# Patient Record
Sex: Male | Born: 1950
Health system: Southern US, Community
[De-identification: ages and names within clinical notes are randomized; demographics above are authoritative.]

## PROBLEM LIST (undated history)

## (undated) DIAGNOSIS — T7840XA Allergy, unspecified, initial encounter: Secondary | ICD-10-CM

## (undated) DIAGNOSIS — N529 Male erectile dysfunction, unspecified: Secondary | ICD-10-CM

## (undated) DIAGNOSIS — F17200 Nicotine dependence, unspecified, uncomplicated: Secondary | ICD-10-CM

## (undated) DIAGNOSIS — C3490 Malignant neoplasm of unspecified part of unspecified bronchus or lung: Secondary | ICD-10-CM

## (undated) DIAGNOSIS — IMO0001 Reserved for inherently not codable concepts without codable children: Secondary | ICD-10-CM

## (undated) DIAGNOSIS — I2510 Atherosclerotic heart disease of native coronary artery without angina pectoris: Secondary | ICD-10-CM

## (undated) DIAGNOSIS — J449 Chronic obstructive pulmonary disease, unspecified: Secondary | ICD-10-CM

## (undated) DIAGNOSIS — M1990 Unspecified osteoarthritis, unspecified site: Secondary | ICD-10-CM

## (undated) DIAGNOSIS — Z87892 Personal history of anaphylaxis: Secondary | ICD-10-CM

## (undated) DIAGNOSIS — B029 Zoster without complications: Secondary | ICD-10-CM

## (undated) DIAGNOSIS — Z889 Allergy status to unspecified drugs, medicaments and biological substances status: Secondary | ICD-10-CM

## (undated) DIAGNOSIS — C61 Malignant neoplasm of prostate: Secondary | ICD-10-CM

## (undated) DIAGNOSIS — IMO0002 Reserved for concepts with insufficient information to code with codable children: Secondary | ICD-10-CM

## (undated) DIAGNOSIS — I10 Essential (primary) hypertension: Secondary | ICD-10-CM

## (undated) DIAGNOSIS — I700 Atherosclerosis of aorta: Secondary | ICD-10-CM

## (undated) DIAGNOSIS — D751 Secondary polycythemia: Secondary | ICD-10-CM

## (undated) HISTORY — DX: Allergy status to unspecified drugs, medicaments and biological substances: Z88.9

## (undated) HISTORY — DX: Personal history of anaphylaxis: Z87.892

## (undated) HISTORY — DX: Secondary polycythemia: D75.1

## (undated) HISTORY — DX: Male erectile dysfunction, unspecified: N52.9

## (undated) HISTORY — DX: Atherosclerotic heart disease of native coronary artery without angina pectoris: I25.10

## (undated) HISTORY — DX: Zoster without complications: B02.9

## (undated) HISTORY — DX: Unspecified osteoarthritis, unspecified site: M19.90

## (undated) HISTORY — DX: Atherosclerosis of aorta: I70.0

## (undated) HISTORY — DX: Allergy, unspecified, initial encounter: T78.40XA

## (undated) HISTORY — DX: Chronic obstructive pulmonary disease, unspecified: J44.9

## (undated) HISTORY — DX: Nicotine dependence, unspecified, uncomplicated: F17.200

---

## 2003-07-18 HISTORY — PX: HERNIA REPAIR: SHX51

## 2009-09-30 ENCOUNTER — Emergency Department (HOSPITAL_COMMUNITY): Admission: EM | Admit: 2009-09-30 | Discharge: 2009-09-30 | Payer: Self-pay | Admitting: Family Medicine

## 2011-04-03 ENCOUNTER — Other Ambulatory Visit: Payer: Self-pay | Admitting: Internal Medicine

## 2011-04-03 ENCOUNTER — Ambulatory Visit
Admission: RE | Admit: 2011-04-03 | Discharge: 2011-04-03 | Disposition: A | Discharge status: Home / Self Care | Payer: Medicare Other | Source: Ambulatory Visit | Attending: Internal Medicine | Admitting: Internal Medicine

## 2011-04-03 DIAGNOSIS — J40 Bronchitis, not specified as acute or chronic: Secondary | ICD-10-CM

## 2011-06-26 ENCOUNTER — Emergency Department (HOSPITAL_COMMUNITY): Payer: Managed Care, Other (non HMO)

## 2011-06-26 ENCOUNTER — Observation Stay (HOSPITAL_COMMUNITY)
Admission: EM | Admit: 2011-06-26 | Discharge: 2011-06-26 | Disposition: A | Discharge status: Home / Self Care | Payer: Managed Care, Other (non HMO) | Source: Ambulatory Visit | Attending: Internal Medicine | Admitting: Internal Medicine

## 2011-06-26 ENCOUNTER — Encounter: Payer: Self-pay | Admitting: *Deleted

## 2011-06-26 ENCOUNTER — Other Ambulatory Visit: Payer: Self-pay

## 2011-06-26 DIAGNOSIS — T782XXA Anaphylactic shock, unspecified, initial encounter: Principal | ICD-10-CM | POA: Insufficient documentation

## 2011-06-26 DIAGNOSIS — L509 Urticaria, unspecified: Secondary | ICD-10-CM | POA: Insufficient documentation

## 2011-06-26 DIAGNOSIS — F1010 Alcohol abuse, uncomplicated: Secondary | ICD-10-CM | POA: Insufficient documentation

## 2011-06-26 DIAGNOSIS — J4489 Other specified chronic obstructive pulmonary disease: Secondary | ICD-10-CM | POA: Insufficient documentation

## 2011-06-26 DIAGNOSIS — I10 Essential (primary) hypertension: Secondary | ICD-10-CM | POA: Insufficient documentation

## 2011-06-26 DIAGNOSIS — R55 Syncope and collapse: Secondary | ICD-10-CM | POA: Insufficient documentation

## 2011-06-26 DIAGNOSIS — E7800 Pure hypercholesterolemia, unspecified: Secondary | ICD-10-CM | POA: Insufficient documentation

## 2011-06-26 DIAGNOSIS — R062 Wheezing: Secondary | ICD-10-CM | POA: Insufficient documentation

## 2011-06-26 DIAGNOSIS — J449 Chronic obstructive pulmonary disease, unspecified: Secondary | ICD-10-CM | POA: Diagnosis present

## 2011-06-26 DIAGNOSIS — F17200 Nicotine dependence, unspecified, uncomplicated: Secondary | ICD-10-CM | POA: Insufficient documentation

## 2011-06-26 HISTORY — DX: Essential (primary) hypertension: I10

## 2011-06-26 HISTORY — DX: Chronic obstructive pulmonary disease, unspecified: J44.9

## 2011-06-26 LAB — POCT I-STAT, CHEM 8
BUN: 17 mg/dL (ref 6–23)
Calcium, Ion: 1.07 mmol/L — ABNORMAL LOW (ref 1.12–1.32)
Chloride: 100 mEq/L (ref 96–112)
Creatinine, Ser: 1.5 mg/dL — ABNORMAL HIGH (ref 0.50–1.35)
Glucose, Bld: 138 mg/dL — ABNORMAL HIGH (ref 70–99)
HCT: 53 % — ABNORMAL HIGH (ref 39.0–52.0)
Hemoglobin: 18 g/dL — ABNORMAL HIGH (ref 13.0–17.0)
Potassium: 3.2 mEq/L — ABNORMAL LOW (ref 3.5–5.1)
Sodium: 141 mEq/L (ref 135–145)
TCO2: 27 mmol/L (ref 0–100)

## 2011-06-26 LAB — POCT I-STAT TROPONIN I: Troponin i, poc: 0.03 ng/mL (ref 0.00–0.08)

## 2011-06-26 LAB — DIFFERENTIAL
Basophils Absolute: 0 10*3/uL (ref 0.0–0.1)
Basophils Relative: 0 % (ref 0–1)
Eosinophils Absolute: 0.1 10*3/uL (ref 0.0–0.7)
Eosinophils Relative: 1 % (ref 0–5)
Lymphocytes Relative: 18 % (ref 12–46)
Lymphs Abs: 2.8 10*3/uL (ref 0.7–4.0)
Monocytes Absolute: 1.1 10*3/uL — ABNORMAL HIGH (ref 0.1–1.0)
Monocytes Relative: 7 % (ref 3–12)
Neutro Abs: 11.5 10*3/uL — ABNORMAL HIGH (ref 1.7–7.7)
Neutrophils Relative %: 74 % (ref 43–77)

## 2011-06-26 LAB — CBC
HCT: 48.3 % (ref 39.0–52.0)
Hemoglobin: 17.4 g/dL — ABNORMAL HIGH (ref 13.0–17.0)
MCH: 34.3 pg — ABNORMAL HIGH (ref 26.0–34.0)
MCHC: 36 g/dL (ref 30.0–36.0)
MCV: 95.3 fL (ref 78.0–100.0)
Platelets: 145 10*3/uL — ABNORMAL LOW (ref 150–400)
RBC: 5.07 MIL/uL (ref 4.22–5.81)
RDW: 13.2 % (ref 11.5–15.5)
WBC: 15.6 10*3/uL — ABNORMAL HIGH (ref 4.0–10.5)

## 2011-06-26 LAB — MRSA PCR SCREENING: MRSA by PCR: NEGATIVE

## 2011-06-26 MED ORDER — ONDANSETRON HCL 4 MG PO TABS: 4.0000 mg | ORAL_TABLET | Freq: Four times a day (QID) | ORAL | Status: DC | PRN

## 2011-06-26 MED ORDER — ONDANSETRON HCL 4 MG/2ML IJ SOLN: 4.0000 mg | Freq: Four times a day (QID) | INTRAMUSCULAR | Status: DC | PRN

## 2011-06-26 MED ORDER — DIPHENHYDRAMINE HCL 25 MG PO CAPS
25.0000 mg | ORAL_CAPSULE | Freq: Three times a day (TID) | ORAL | Status: DC
  Administered 2011-06-26 (×2): 25 mg via ORAL
  Filled 2011-06-26 (×2): qty 1

## 2011-06-26 MED ORDER — FAMOTIDINE 20 MG PO TABS: 20.0000 mg | ORAL_TABLET | Freq: Two times a day (BID) | ORAL | Status: DC

## 2011-06-26 MED ORDER — VITAMIN B-1 100 MG PO TABS
100.0000 mg | ORAL_TABLET | Freq: Every day | ORAL | Status: DC
  Administered 2011-06-26: 100 mg via ORAL
  Filled 2011-06-26: qty 1

## 2011-06-26 MED ORDER — LORAZEPAM 2 MG/ML IJ SOLN: 1.0000 mg | Freq: Four times a day (QID) | INTRAMUSCULAR | Status: DC | PRN

## 2011-06-26 MED ORDER — HYDRALAZINE HCL 20 MG/ML IJ SOLN: 10.0000 mg | INTRAMUSCULAR | Status: DC | PRN

## 2011-06-26 MED ORDER — ROSUVASTATIN CALCIUM 10 MG PO TABS
10.0000 mg | ORAL_TABLET | Freq: Every day | ORAL | Status: DC
  Administered 2011-06-26: 10 mg via ORAL
  Filled 2011-06-26: qty 1

## 2011-06-26 MED ORDER — HYDROCHLOROTHIAZIDE 12.5 MG PO TABS: 25.0000 mg | ORAL_TABLET | Freq: Every day | ORAL | Status: DC

## 2011-06-26 MED ORDER — METHYLPREDNISOLONE SODIUM SUCC 40 MG IJ SOLR
40.0000 mg | Freq: Every day | INTRAMUSCULAR | Status: DC
  Administered 2011-06-26: 40 mg via INTRAVENOUS
  Filled 2011-06-26: qty 1

## 2011-06-26 MED ORDER — LORAZEPAM 1 MG PO TABS: 1.0000 mg | ORAL_TABLET | Freq: Four times a day (QID) | ORAL | Status: DC | PRN

## 2011-06-26 MED ORDER — ASPIRIN 81 MG PO CHEW
81.0000 mg | CHEWABLE_TABLET | Freq: Every day | ORAL | Status: DC
  Administered 2011-06-26: 81 mg via ORAL
  Filled 2011-06-26: qty 1

## 2011-06-26 MED ORDER — ACETAMINOPHEN 325 MG PO TABS: 650.0000 mg | ORAL_TABLET | Freq: Four times a day (QID) | ORAL | Status: DC | PRN

## 2011-06-26 MED ORDER — DIPHENHYDRAMINE HCL 50 MG/ML IJ SOLN
INTRAMUSCULAR | Status: AC
  Filled 2011-06-26: qty 1

## 2011-06-26 MED ORDER — IPRATROPIUM BROMIDE 0.02 % IN SOLN
RESPIRATORY_TRACT | Status: AC
  Filled 2011-06-26: qty 2.5

## 2011-06-26 MED ORDER — FOLIC ACID 1 MG PO TABS
1.0000 mg | ORAL_TABLET | Freq: Every day | ORAL | Status: DC
  Administered 2011-06-26: 1 mg via ORAL
  Filled 2011-06-26: qty 1

## 2011-06-26 MED ORDER — SODIUM CHLORIDE 0.9 % IV SOLN
INTRAVENOUS | Status: DC
  Administered 2011-06-26 (×2): via INTRAVENOUS

## 2011-06-26 MED ORDER — PREDNISONE (PAK) 10 MG PO TABS: ORAL_TABLET | ORAL | Status: DC

## 2011-06-26 MED ORDER — POTASSIUM CHLORIDE CRYS ER 20 MEQ PO TBCR
30.0000 meq | EXTENDED_RELEASE_TABLET | Freq: Once | ORAL | Status: AC
  Administered 2011-06-26: 30 meq via ORAL
  Filled 2011-06-26: qty 2

## 2011-06-26 MED ORDER — METHYLPREDNISOLONE SODIUM SUCC 125 MG IJ SOLR
INTRAMUSCULAR | Status: AC
  Administered 2011-06-26: 40 mg
  Filled 2011-06-26: qty 2

## 2011-06-26 MED ORDER — ALBUTEROL SULFATE (5 MG/ML) 0.5% IN NEBU
INHALATION_SOLUTION | RESPIRATORY_TRACT | Status: AC
  Filled 2011-06-26: qty 1

## 2011-06-26 MED ORDER — ACETAMINOPHEN 650 MG RE SUPP: 650.0000 mg | Freq: Four times a day (QID) | RECTAL | Status: DC | PRN

## 2011-06-26 MED ORDER — FAMOTIDINE IN NACL 20-0.9 MG/50ML-% IV SOLN
20.0000 mg | Freq: Two times a day (BID) | INTRAVENOUS | Status: DC
  Administered 2011-06-26: 20 mg via INTRAVENOUS
  Filled 2011-06-26 (×2): qty 50

## 2011-06-26 MED ORDER — POTASSIUM CHLORIDE ER 10 MEQ PO TBCR: 20.0000 meq | EXTENDED_RELEASE_TABLET | Freq: Two times a day (BID) | ORAL | Status: DC

## 2011-06-26 MED ORDER — POTASSIUM CHLORIDE CRYS ER 20 MEQ PO TBCR
40.0000 meq | EXTENDED_RELEASE_TABLET | Freq: Once | ORAL | Status: AC
  Administered 2011-06-26: 40 meq via ORAL
  Filled 2011-06-26: qty 2

## 2011-06-26 MED ORDER — THERA M PLUS PO TABS
1.0000 | ORAL_TABLET | Freq: Every day | ORAL | Status: DC
  Administered 2011-06-26: 1 via ORAL
  Filled 2011-06-26: qty 1

## 2011-06-26 MED ORDER — EPINEPHRINE 0.3 MG/0.3ML IJ DEVI: 0.3000 mg | Freq: Once | INTRAMUSCULAR | Status: DC

## 2011-06-26 MED ORDER — LORATADINE 10 MG PO TABS: 10.0000 mg | ORAL_TABLET | Freq: Every day | ORAL | Status: DC

## 2011-06-26 MED ORDER — THIAMINE HCL 100 MG/ML IJ SOLN
100.0000 mg | Freq: Every day | INTRAMUSCULAR | Status: DC
  Filled 2011-06-26: qty 1

## 2011-06-26 NOTE — Consult Note (Signed)
Pt smokes 1- 1 1/2 ppd. He is in action stage and eager to quit. After discussing pharmacotherapy options pt is interested in the patch and lozenges. Recommended 21 mg patch x 6 weeks, 14 mg patch x 2 weeks, 7 mg patch x 2 weeks. Also to use 4 mg nicotine lozenges PRN. Instructed pt on patch and lozenge use instructions. Referred to 1-800 quit now for f/u and support. Discussed oral fixation substitutes, second hand smoke and in home smoking policy. Reviewed and gave pt Written education/contact information.

## 2011-06-26 NOTE — Discharge Summary (Addendum)
DISCHARGE SUMMARY  Brian White  MR#: [id number]  DOB:Dec 13, 1950  Date of Admission: 06/26/2011 Date of Discharge: 06/26/2011  Attending Physician:[facility]  Patient's PCP: [facility] nurse practitioner at [facility]  Consults:  none  Discharge Diagnoses: Present on Admission:  .Anaphylaxis .HTN (hypertension) .COPD (chronic obstructive pulmonary disease)    Current Discharge Medication List    START taking these medications   Details  EPINEPHrine (EPIPEN) 0.3 mg/0.3 mL DEVI Inject 0.3 mLs (0.3 mg total) into the muscle once. Qty: 1 Device, Refills: prn    famotidine (PEPCID) 20 MG tablet Take 1 tablet (20 mg total) by mouth 2 (two) times daily.    hydrochlorothiazide (HYDRODIURIL) 12.5 MG tablet Take 2 tablets (25 mg total) by mouth daily. Qty: 60 tablet, Refills: 0    loratadine (CLARITIN) 10 MG tablet Take 1 tablet (10 mg total) by mouth daily.    potassium chloride (K-DUR) 10 MEQ tablet Take 2 tablets (20 mEq total) by mouth 2 (two) times daily. Qty: 30 tablet, Refills: 0    predniSONE (STERAPRED UNI-PAK) 10 MG tablet Take 4 tablets a day for 2 days starting 06/27/2011, then 2 tablets a day for 2 days, then one tablet a day for 2 days, then stop Qty: 14 tablet, Refills: 0      CONTINUE these medications which have NOT CHANGED   Details  aspirin 81 MG tablet Take 81 mg by mouth daily.      naproxen sodium (ANAPROX) 220 MG tablet Take 440 mg by mouth 2 (two) times daily with a meal. For pain     rosuvastatin (CRESTOR) 10 MG tablet Take 10 mg by mouth daily.        STOP taking these medications     ibuprofen (ADVIL,MOTRIN) 800 MG tablet      lisinopril (PRINIVIL,ZESTRIL) 10 MG tablet      lisinopril-hydrochlorothiazide (PRINZIDE,ZESTORETIC) 20-25 MG per tablet           [facility] Course: Present on Admission:   the patient was admitted to the [facility] on observation status after having an allergic reaction to an unknown  allergen.  He developed diffuse urticaria as well as hypotension leading to syncopal spells x2.  He was not able to identify any potential offending agents and states that he has not changed his usual diet or habits. With usual treatment to include intravenous steroids, H1 blockers, and H2 blockers he improved dramatically. By 4 PM on the day of his admission he was back to his baseline and asking to be discharged home.  At this time there no persistent urticaria. The patient has no respiratory difficulties whatsoever. I have counseled the patient extensively as to the use of an EpiPen. I've advised him that he should call 911 immediately should he require the use of his EpiPen. I've advised him that outpatient allergy testing would be very important to help prevent future exposures.   Day of Discharge BP 116/59  Pulse 54  Temp(Src) 98 F (36.7 C) (Oral)  Resp 23  Ht 5\' 11"  (1.803 m)  Wt 100.7 kg (222 lb 0.1 oz)  BMI 30.96 kg/m2  SpO2 95%  Physical Exam: General: No acute respiratory distress Lungs: Clear to auscultation bilaterally without wheezes or crackles Cardiovascular: Regular rate and rhythm without murmur gallop or rub normal S1 and S2 Abdomen: Nontender, nondistended, soft, bowel sounds positive, no rebound, no ascites, no appreciable mass Extremities: No significant cyanosis, clubbing, or edema bilateral lower extremities  Results for orders placed during  the [facility] encounter of 06/26/11 (from the past 24 hour(s))  CBC     Status: Abnormal   Collection Time   06/26/11  2:16 AM      Component Value Range   WBC 15.6 (*) 4.0 - 10.5 (K/uL)   RBC 5.07  4.22 - 5.81 (MIL/uL)   Hemoglobin 17.4 (*) 13.0 - 17.0 (g/dL)   HCT 16.1  09.6 - 04.5 (%)   MCV 95.3  78.0 - 100.0 (fL)   MCH 34.3 (*) 26.0 - 34.0 (pg)   MCHC 36.0  30.0 - 36.0 (g/dL)   RDW 40.9  81.1 - 91.4 (%)   Platelets 145 (*) 150 - 400 (K/uL)  DIFFERENTIAL     Status: Abnormal   Collection Time   06/26/11  2:16 AM       Component Value Range   Neutrophils Relative 74  43 - 77 (%)   Neutro Abs 11.5 (*) 1.7 - 7.7 (K/uL)   Lymphocytes Relative 18  12 - 46 (%)   Lymphs Abs 2.8  0.7 - 4.0 (K/uL)   Monocytes Relative 7  3 - 12 (%)   Monocytes Absolute 1.1 (*) 0.1 - 1.0 (K/uL)   Eosinophils Relative 1  0 - 5 (%)   Eosinophils Absolute 0.1  0.0 - 0.7 (K/uL)   Basophils Relative 0  0 - 1 (%)   Basophils Absolute 0.0  0.0 - 0.1 (K/uL)  POCT I-STAT TROPONIN I     Status: Normal   Collection Time   06/26/11  2:24 AM      Component Value Range   Troponin i, poc 0.03  0.00 - 0.08 (ng/mL)   Comment 3           POCT I-STAT, CHEM 8     Status: Abnormal   Collection Time   06/26/11  2:27 AM      Component Value Range   Sodium 141  135 - 145 (mEq/L)   Potassium 3.2 (*) 3.5 - 5.1 (mEq/L)   Chloride 100  96 - 112 (mEq/L)   BUN 17  6 - 23 (mg/dL)   Creatinine, Ser 7.82 (*) 0.50 - 1.35 (mg/dL)   Glucose, Bld 956 (*) 70 - 99 (mg/dL)   Calcium, Ion 2.13 (*) 1.12 - 1.32 (mmol/L)   TCO2 27  0 - 100 (mmol/L)   Hemoglobin 18.0 (*) 13.0 - 17.0 (g/dL)   HCT 08.6 (*) 57.8 - 52.0 (%)  MRSA PCR SCREENING     Status: Normal   Collection Time   06/26/11  5:30 AM      Component Value Range   MRSA by PCR NEGATIVE  NEGATIVE     Disposition: Discharge home  Follow-up Appts: Discharge Orders    Future Orders Please Complete By Expires   Diet - low sodium heart healthy      Increase activity slowly         Follow-up  1 - the patient is instructed to follow-up with his primary care provider at [facility] urgent care for a recheck of his blood pressure and potassium within 7 days  2 -the patient is instructed to contact [facility] to arrange for an outpatient visit for extensive allergy testing as soon as possible   Tests Needing Follow-up: A followup basic metabolic panel is recommended when the patient is evaluated by his primary care provider  Signed: [facility] 06/26/2011, 3:43 PM

## 2011-06-26 NOTE — ED Notes (Addendum)
Per EMS - pt reported to have syncopal episode at home by wife - on EMS arrival pt A&Ox4 no resp distress noted. Pt w/ generalized hives on assessment.  Pt received 10mg  alb neb, atroven, 25mg  PO benadryl prior to EMS arrival, pt then received 25mg  IV benadryl from EMS, 50mg  zantac, and 125mg  solu-medrol.

## 2011-06-26 NOTE — ED Notes (Signed)
Patient is resting comfortably. 

## 2011-06-26 NOTE — ED Provider Notes (Signed)
History     CSN: [id number] Arrival date & time: 06/26/2011  1:43 AM   First MD Initiated Contact with Patient 06/26/11 0210      Chief Complaint  Patient presents with  . Allergic Reaction    (Consider location/radiation/quality/duration/timing/severity/associated sxs/prior treatment) Patient is a 60 y.o. White presenting with allergic reaction. The history is provided by the patient and the EMS personnel. No language interpreter was used.  Allergic Reaction The primary symptoms are  wheezing and urticaria. The primary symptoms do not include cough, abdominal pain, nausea, vomiting, diarrhea or dizziness. Primary symptoms comment: syncope and hypotension The current episode started 1 to 2 hours ago. The problem has been gradually worsening. This is a new problem.  Wheezing began today. Wheezing occurs continuously. The wheezing has been rapidly improving since its onset. The wheezing was precipitated by animal exposure. The patient's medical history is significant for COPD.  Associated with: only exposure was to a family members dog. Significant symptoms also include itching. Significant symptoms that are not present include eye redness, flushing or rhinorrhea.  No clothing, medication, chemical exposures.    Past Medical History  Diagnosis Date  . Hypertension   . Hypercholesteremia   . Myocardial infarction     No past surgical history on file.  No family history on file.  History  Substance Use Topics  . Smoking status: Current Everyday Smoker -- 1.0 packs/day  . Smokeless tobacco: Not on file  . Alcohol Use: 12.6 oz/week    21 Shots of liquor per week      Review of Systems  Constitutional: Positive for activity change.  HENT: Negative for facial swelling, rhinorrhea, neck pain and neck stiffness.   Eyes: Negative for redness.  Respiratory: Positive for wheezing. Negative for cough and stridor.   Cardiovascular: Negative for chest pain.  Gastrointestinal: Negative  for nausea, vomiting, abdominal pain, diarrhea and abdominal distention.  Genitourinary: Negative for difficulty urinating.  Musculoskeletal: Negative for arthralgias.  Skin: Positive for itching. Negative for flushing.  Neurological: Negative for dizziness.  Hematological: Negative.   Psychiatric/Behavioral: Negative.     Allergies  Review of patient's allergies indicates no known allergies.  Home Medications   Current Outpatient Rx  Name Route Sig Dispense Refill  . ASPIRIN 81 MG PO TABS Oral Take 81 mg by mouth daily.      . IBUPROFEN 800 MG PO TABS Oral Take 800 mg by mouth once.      Marland Kitchen LISINOPRIL 10 MG PO TABS Oral Take 20 mg by mouth daily.     Marland Kitchen LISINOPRIL-HYDROCHLOROTHIAZIDE 20-25 MG PO TABS Oral Take 1 tablet by mouth daily.      Marland Kitchen NAPROXEN SODIUM 220 MG PO TABS Oral Take 440 mg by mouth 2 (two) times daily with a meal. For pain     . ROSUVASTATIN CALCIUM 10 MG PO TABS Oral Take 10 mg by mouth daily.        BP 169/72  Pulse 91  Temp(Src) 97.3 F (36.3 C) (Oral)  Resp 24  SpO2 98%  Physical Exam  Constitutional: He is oriented to person, place, and time. He appears well-developed and well-nourished.  HENT:  Head: Normocephalic and atraumatic.  Mouth/Throat: Oropharynx is clear and moist.       No swelling of the lips tongue or uvula  Eyes: EOM are normal. Pupils are equal, round, and reactive to light.  Neck: Normal range of motion. Neck supple.  Cardiovascular: Normal rate and regular rhythm.  Distant sounds  Pulmonary/Chest: No stridor. He has wheezes.  Abdominal: Soft. Bowel sounds are normal. There is no tenderness. There is no rebound and no guarding.  Musculoskeletal: Normal range of motion. He exhibits no edema.  Neurological: He is alert and oriented to person, place, and time.  Skin: Skin is warm and dry. He is not diaphoretic.       No urticaria on arrival  Psychiatric: Thought content normal.    ED Course  Procedures (including critical care  time)  Labs Reviewed  CBC - Abnormal; Notable for the following:    WBC 15.6 (*)    Hemoglobin 17.4 (*)    MCH 34.3 (*)    Platelets 145 (*)    All other components within normal limits  DIFFERENTIAL - Abnormal; Notable for the following:    Neutro Abs 11.5 (*)    Monocytes Absolute 1.1 (*)    All other components within normal limits  POCT I-STAT, CHEM 8 - Abnormal; Notable for the following:    Potassium 3.2 (*)    Creatinine, Ser 1.50 (*)    Glucose, Bld 138 (*)    Calcium, Ion 1.07 (*)    Hemoglobin 18.0 (*)    HCT 53.0 (*)    All other components within normal limits  POCT I-STAT TROPONIN I  I-STAT, CHEM 8  I-STAT TROPONIN I   Dg Chest Port 1 View  06/26/2011  *RADIOLOGY REPORT*  Clinical Data: Shortness of breath.  Syncope.  Hives.  PORTABLE CHEST - 1 VIEW  Comparison: 04/03/2011  Findings: Duration.  Normal heart size and pulmonary vascularity. Interstitial changes seen previously in the lung bases are improved or less well visualized.  No focal airspace consolidation suggested.  No blunting of costophrenic angles.  No pneumothorax.  IMPRESSION: No evidence of active pulmonary disease.  Original Report Authenticated By: Marlon Pel, M.D.     No diagnosis found.    MDM   Date: 06/26/2011  Rate: 84  Rhythm: normal sinus rhythm  QRS Axis: left  Intervals: normal  ST/T Wave abnormalities: nonspecific ST changes  Conduction Disutrbances:none  Narrative Interpretation: RBBB inferolateral infarct age undetermined  Old EKG Reviewed: none available         Kampbell Holaway K Nobel Brar-Rasch, MD 06/30/11 [phone]

## 2011-06-26 NOTE — H&P (Signed)
Brian White is an 60 y.o. male.   PCP is Dr. Wille Celeste at urgent care [facility]. Chief Complaint: Diffuse rash and itching. HPI: 60 year-old male with history of hypertension hyperlipidemia previous history of MI was brought in the ER after patient had diffuse urticarial rash and was found to be hypotensive. Patient states he went to the bed and around midnight he started getting itching and a diffuse rash broke all over body. So he decided to go have a bath. After having a bath he felt weak and had a syncopal episode. This was witnessed by his wife. His wife states he may have lost his consciousness for a few seconds. Then he wanted to go to the bathroom again and in the bathroom he again lost consciousness for 30 seconds. This time his wife called EMS. He was brought to the ER. In the ER Dr.Palumbo the ER physician who examined initially notice that he had diffuse rash. He was given Solu-Medrol, Benadryl, Zantac. After which he got better and his rash is completely resolved. His blood pressure is stable he'll be admitted for observation. Patient denies any swelling of the tongue though he did have some shortness of breath initially which also has resolved. Denies any chest pain, focal deficit or headache. Patient denies having taken any new medications, use any new cologne, used any new detergent or any new soaps. He did not have an insect bite. Patient has had a prostate biopsy last week after which he was given 3 does of antibiotics last dose was taken 5 days ago. He does not recall the medications name. He is on lisinopril the dose of which was increased 2 months ago. The last medication he took before going to bed was ibuprofen which he usually takes, for his back pain.  Past Medical History  Diagnosis Date  . Hypertension   . Hypercholesteremia   . Myocardial infarction     History reviewed. No pertinent past surgical history.  History reviewed. No pertinent family history. Social History:   reports that he has been smoking.  He does not have any smokeless tobacco history on file. He reports that he drinks about 12.6 ounces of alcohol per week. He reports that he uses illicit drugs (Marijuana).  Allergies: No Known Allergies  Medications Prior to Admission  Medication Dose Route Frequency Provider Last Rate Last Dose  . albuterol (PROVENTIL) (5 MG/ML) 0.5% nebulizer solution           . diphenhydrAMINE (BENADRYL) 50 MG/ML injection           . ipratropium (ATROVENT) 0.02 % nebulizer solution           . methylPREDNISolone sodium succinate (SOLU-MEDROL) 125 MG injection           . potassium chloride SA (K-DUR,KLOR-CON) CR tablet 30 mEq  30 mEq Oral Once April K Palumbo-Rasch, MD   30 mEq at 06/26/11 0355   No current outpatient prescriptions on file as of 06/26/2011.    Results for orders placed during the hospital encounter of 06/26/11 (from the past 48 hour(s))  CBC     Status: Abnormal   Collection Time   06/26/11  2:16 AM      Component Value Range Comment   WBC 15.6 (*) 4.0 - 10.5 (K/uL)    RBC 5.07  4.22 - 5.81 (MIL/uL)    Hemoglobin 17.4 (*) 13.0 - 17.0 (g/dL)    HCT 04.5  40.9 - 81.1 (%)    MCV 95.3  78.0 - 100.0 (fL)    MCH 34.3 (*) 26.0 - 34.0 (pg)    MCHC 36.0  30.0 - 36.0 (g/dL)    RDW 16.1  09.6 - 04.5 (%)    Platelets 145 (*) 150 - 400 (K/uL)   DIFFERENTIAL     Status: Abnormal   Collection Time   06/26/11  2:16 AM      Component Value Range Comment   Neutrophils Relative 74  43 - 77 (%)    Neutro Abs 11.5 (*) 1.7 - 7.7 (K/uL)    Lymphocytes Relative 18  12 - 46 (%)    Lymphs Abs 2.8  0.7 - 4.0 (K/uL)    Monocytes Relative 7  3 - 12 (%)    Monocytes Absolute 1.1 (*) 0.1 - 1.0 (K/uL)    Eosinophils Relative 1  0 - 5 (%)    Eosinophils Absolute 0.1  0.0 - 0.7 (K/uL)    Basophils Relative 0  0 - 1 (%)    Basophils Absolute 0.0  0.0 - 0.1 (K/uL)   POCT I-STAT TROPONIN I     Status: Normal   Collection Time   06/26/11  2:24 AM      Component Value  Range Comment   Troponin i, poc 0.03  0.00 - 0.08 (ng/mL)    Comment 3            POCT I-STAT, CHEM 8     Status: Abnormal   Collection Time   06/26/11  2:27 AM      Component Value Range Comment   Sodium 141  135 - 145 (mEq/L)    Potassium 3.2 (*) 3.5 - 5.1 (mEq/L)    Chloride 100  96 - 112 (mEq/L)    BUN 17  6 - 23 (mg/dL)    Creatinine, Ser 4.09 (*) 0.50 - 1.35 (mg/dL)    Glucose, Bld 811 (*) 70 - 99 (mg/dL)    Calcium, Ion 9.14 (*) 1.12 - 1.32 (mmol/L)    TCO2 27  0 - 100 (mmol/L)    Hemoglobin 18.0 (*) 13.0 - 17.0 (g/dL)    HCT 78.2 (*) 95.6 - 52.0 (%)    Dg Chest Port 1 View  06/26/2011  *RADIOLOGY REPORT*  Clinical Data: Shortness of breath.  Syncope.  Hives.  PORTABLE CHEST - 1 VIEW  Comparison: 04/03/2011  Findings: Duration.  Normal heart size and pulmonary vascularity. Interstitial changes seen previously in the lung bases are improved or less well visualized.  No focal airspace consolidation suggested.  No blunting of costophrenic angles.  No pneumothorax.  IMPRESSION: No evidence of active pulmonary disease.  Original Report Authenticated By: Marlon Pel, M.D.    Review of Systems  Constitutional: Negative.   HENT: Negative.   Eyes: Negative.   Respiratory: Positive for shortness of breath.   Cardiovascular: Negative.   Gastrointestinal: Negative.   Genitourinary: Negative.   Musculoskeletal: Negative.   Skin: Positive for itching and rash.  Neurological: Negative.   Endo/Heme/Allergies: Negative.   Psychiatric/Behavioral: Negative.     Blood pressure 103/49, pulse 57, temperature 97.3 F (36.3 C), temperature source Oral, resp. rate 23, SpO2 95.00%. Physical Exam  Constitutional: He is oriented to person, place, and time. He appears well-developed and well-nourished. No distress.  HENT:  Head: Normocephalic and atraumatic.  Right Ear: External ear normal.  Left Ear: External ear normal.  Nose: Nose normal.  Mouth/Throat: Oropharynx is clear and  moist. No oropharyngeal exudate.  Eyes: Conjunctivae and EOM are  normal. Pupils are equal, round, and reactive to light. Right eye exhibits no discharge. Left eye exhibits no discharge. No scleral icterus.  Neck: Normal range of motion. Neck supple. No JVD present.  Cardiovascular: Normal rate, regular rhythm, normal heart sounds and intact distal pulses.   Respiratory: Effort normal and breath sounds normal. No stridor. No respiratory distress. He has no wheezes. He has no rales.  GI: Soft. Bowel sounds are normal. He exhibits no distension. There is no tenderness. There is no rebound.  Musculoskeletal: Normal range of motion. He exhibits no edema and no tenderness.  Neurological: He is alert and oriented to person, place, and time. He has normal reflexes. No cranial nerve deficit. Coordination normal.  Skin: Skin is warm and dry. No rash noted. He is not diaphoretic. No erythema. No pallor.  Psychiatric: His behavior is normal.     Assessment/Plan #1. Anaphylaxis reaction versus severe urticarial reaction. #2. Recent prostate biopsy. #3. On ACE inhibitors for hypertension. #4. COPD. #5. History of MI. #6. Ongoing tobacco abuse and alcohol abuse.  Plan We'll admit to step down unit for observation. I am going to continue with Solu-Medrol, Benadryl, Pepcid. We don't know exactly what caused the reaction. We'll discontinue ACE inhibitors. For his blood pressure control at this time I'm placing him on IV hydralazine when necessary but eventually may need definite antihypertensives but may not continue ACE inhibitors.   06/26/2011, 4:49 AM   222

## 2011-06-26 NOTE — Progress Notes (Signed)
UR COMPLETED PT WAS ADMITTED WITH ALLERGIC REACTION TO A MEDICATION.  WILL F/U ON DC NEEDS, PTA PT WAS AT HOME WITH SELF CARE [facility] 06/26/2011 [phone] OR [phone]

## 2011-07-18 DIAGNOSIS — Z87892 Personal history of anaphylaxis: Secondary | ICD-10-CM

## 2011-07-18 DIAGNOSIS — C61 Malignant neoplasm of prostate: Secondary | ICD-10-CM

## 2011-07-18 HISTORY — DX: Personal history of anaphylaxis: Z87.892

## 2011-07-18 HISTORY — DX: Malignant neoplasm of prostate: C61

## 2011-08-02 ENCOUNTER — Encounter: Payer: Self-pay | Admitting: Radiation Oncology

## 2011-08-02 ENCOUNTER — Ambulatory Visit
Admission: RE | Admit: 2011-08-02 | Discharge: 2011-08-02 | Disposition: A | Discharge status: Home / Self Care | Payer: Managed Care, Other (non HMO) | Source: Ambulatory Visit | Attending: Radiation Oncology | Admitting: Radiation Oncology

## 2011-08-02 VITALS — BP 148/87 | HR 57 | Temp 97.2°F | Resp 18 | Ht 71.0 in | Wt 222.4 lb

## 2011-08-02 DIAGNOSIS — E7800 Pure hypercholesterolemia, unspecified: Secondary | ICD-10-CM | POA: Diagnosis not present

## 2011-08-02 DIAGNOSIS — C61 Malignant neoplasm of prostate: Secondary | ICD-10-CM

## 2011-08-02 DIAGNOSIS — Z809 Family history of malignant neoplasm, unspecified: Secondary | ICD-10-CM | POA: Insufficient documentation

## 2011-08-02 DIAGNOSIS — Z79899 Other long term (current) drug therapy: Secondary | ICD-10-CM | POA: Diagnosis not present

## 2011-08-02 DIAGNOSIS — R300 Dysuria: Secondary | ICD-10-CM | POA: Diagnosis not present

## 2011-08-02 DIAGNOSIS — Z7982 Long term (current) use of aspirin: Secondary | ICD-10-CM | POA: Insufficient documentation

## 2011-08-02 DIAGNOSIS — I10 Essential (primary) hypertension: Secondary | ICD-10-CM | POA: Insufficient documentation

## 2011-08-02 DIAGNOSIS — I252 Old myocardial infarction: Secondary | ICD-10-CM | POA: Insufficient documentation

## 2011-08-02 DIAGNOSIS — R351 Nocturia: Secondary | ICD-10-CM | POA: Insufficient documentation

## 2011-08-02 DIAGNOSIS — Z510 Encounter for antineoplastic radiation therapy: Secondary | ICD-10-CM | POA: Diagnosis not present

## 2011-08-02 HISTORY — DX: Malignant neoplasm of prostate: C61

## 2011-08-02 NOTE — Progress Notes (Signed)
[facility] Radiation Oncology NEW PATIENT EVALUATION  Name: Brian White MRN: [id number]  Date: 08/02/2011  DOB: Dec 24, 1950  Status: outpatient   CC:   Kathi Ludwig, * Dr. Fredrik Cove   REFERRING PHYSICIAN: Jethro Bolus I, *   DIAGNOSIS: Stage TI C. 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 male who is seen today for the courtesy of Dr. Patsi Sears for discussion of possible radiation therapy in the management of his stage TI C. favorable risk adenocarcinoma prostate at the time of his annual physical back in July 2012 he was found to have an elevated PSA of 6.2. He was referred to Dr. Patsi Sears who repeated his PSA and this was 7.12 on 05/30/2011. His percent free PSA was only 15%. He underwent ultrasound-guided biopsies on 06/21/2011. He was found to have 4 areas of Gleason 6 (3+3) adenocarcinoma involving 40% of one core from the right lateral base, 5% of one core from the right mid gland, 5% of one core from the right lateral apex and less than 5% of one core from the left lateral base. His gland volume was 88.5 cc with a prosthetic length is 5.68 cm. He is doing reasonably well from a GU and GI standpoint. His I PSS score is 12. No GI difficulties. He does have rectal dysfunction and he has tried Levitra which was of marginal benefit.   PREVIOUS RADIATION THERAPY: No   PAST MEDICAL HISTORY:  has a past medical history of Hypertension; Hypercholesteremia (1990); Arthritis; Heart disease; Myocardial infarction; and Prostate cancer.     PAST SURGICAL HISTORY:  Past Surgical History  Procedure Date  . Hernia repair   . Prostate biopsy      FAMILY HISTORY: family history includes Cancer in his father and sister. his father died from cardiac disease and 46. His mother is alive and well at 62 and lives independently.   SOCIAL HISTORY:  reports that he has been smoking Cigarettes.  He has a 60 pack-year  smoking history. He does not have any smokeless tobacco history on file. He reports that he drinks about .6 ounces of alcohol per week. He reports that he uses illicit drugs (Marijuana). Recently remarried 3 months ago. 2 daughters from his previous marriage, 4 grandchildren. 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   ALLERGIES: Onion   MEDICATIONS:  Current Outpatient Prescriptions  Medication Sig Dispense Refill  . aspirin 81 MG tablet Take 81 mg by mouth daily.        . finasteride (PROSCAR) 5 MG tablet Take 5 mg by mouth daily.      Marland Kitchen lisinopril-hydrochlorothiazide (PRINZIDE,ZESTORETIC) 20-25 MG per tablet Take 1 tablet by mouth daily.      . naproxen sodium (ANAPROX) 220 MG tablet Take 440 mg by mouth 2 (two) times daily with a meal. For pain       . vardenafil (LEVITRA) 10 MG tablet Take 10 mg by mouth daily as needed.      . ALPRAZolam (XANAX) 0.5 MG tablet Take 0.5 mg by mouth at bedtime as needed.      Marland Kitchen EPINEPHrine (EPIPEN) 0.3 mg/0.3 mL DEVI Inject 0.3 mLs (0.3 mg total) into the muscle once.  1 Device  prn  . loratadine (CLARITIN) 10 MG tablet Take 1 tablet (10 mg total) by mouth daily.      . rosuvastatin (CRESTOR) 10 MG tablet Take 10 mg by mouth daily.  REVIEW OF SYSTEMS:  Pertinent items are noted in HPI.    PHYSICAL EXAM:  height is 5\' 11"  (1.803 m) and weight is 222 lb 6.4 oz (100.88 kg). His oral temperature is 97.2 F (36.2 C). His blood pressure is 148/87 and his pulse is 57. His respiration is 18.   Head and neck examination: Grossly unremarkable. Nodes: Without palpable cervical or supraclavicular lymphadenopathy. Chest: A few scattered wheezes with diminished breath sounds throughout. Heart: Regular in rhythm. Back: Without palpable spinal or CVA discomfort. Abdomen: Without masses or organomegaly. Genitalia: Unremarkable to inspection. Rectal: His prostate gland is moderately enlarged and is without focal induration or nodularity.  Extremities: Without edema. Neurologic exam: Grossly nonfocal.   LABORATORY DATA:  Lab Results  Component Value Date   WBC 15.6* 06/26/2011   HGB 18.0* 06/26/2011   HCT 53.0* 06/26/2011   MCV 95.3 06/26/2011   PLT 145* 06/26/2011   Lab Results  Component Value Date   NA 141 06/26/2011   K 3.2* 06/26/2011   CL 100 06/26/2011   No results found for this basename: ALT, AST, GGT, ALKPHOS, BILITOT   PSA 7.15 from 05/25/2011.   IMPRESSION: Stage TI C. favorable risk adenocarcinoma prostate. I explained to the patient and his wife that his prognosis is related to his stage, PSA level, and Gleason score. All are favorable. We discussed surgery versus observation versus radiation therapy. Radiation therapy options include seed implantation alone versus external beam/IMRT. He would obviously require downsizing because of his large gland size. We can expect at least 30% shrinkage with 3 months of androgen deprivation therapy. He may very well require up to 6 months of androgen deprivation therapy to get his gland size in length to be suitable for seed implantation. We discussed seed implantation and external beam/IMRT in great detail. We discussed the potential side effects of short-term androgen deprivation therapy for downsizing. We also discussed the potential acute and late toxicities of radiation therapy. He will think things over and get back in touch with me if you like to consider radiation therapy. He was given literature for review.   PLAN: As discussed above. I gave him my voicemail to contact me should he want to consider radiation therapy.   I spent 60 minutes minutes face to face with the patient and more than 50% of that time was spent in counseling and/or coordination of care.

## 2011-08-02 NOTE — Progress Notes (Signed)
Patient presented to the clinic today accompanied by his wife for an initial consultation with [name]. Patient is alert and oriented to person, place, and time. No distress noted. Steady gait noted. Pleasant affect noted. Patient denies pain at this time. Patient denies nausea, vomiting, diarrhea, constipation, dizziness or headache. Patient reports urgency. Patient reports urinating 1-2 times per night on average. Patient denies pain or burning with urination or bowel movement. Patient reports voiding approximately every 2-3 hours during the day. Patient denies seeing blood in his urine. Patient reports eating and sleeping without difficulty. Reported all findings to [name].    IPSS score 12

## 2011-08-02 NOTE — Progress Notes (Signed)
62 year old male. Married X 3 months. Retired.   Patient referred to Dr. Patsi Sears 03/24/11 from Dr. Fredrik Cove reference elevated PSA, urinary frequency, nocturia, ED, and IPSS score of 3.   02/01/11 PSA 6.2 05/25/11 PSA 7.15  Prostate biopsy done 06/21/11. Biopsy revealed adenocarcinoma of the prostate with Gleason of 6 in 40% of right lateral base, 5% of right apex lateral, right mid medial, and left base lateral biopsies. Prostate 88 grams.  Patient and wife expressed to Dr.Tannenbaum they would like to pursue I-125 seeds , a second opinion from [name], and hormone script to shrink his prostate.   NKDA No hx of radiation therapy in the past No indications of a pacemaker

## 2011-08-02 NOTE — Progress Notes (Signed)
Please see the Nurse Progress Note in the MD Initial Consult Encounter for this patient. 

## 2011-08-02 NOTE — Progress Notes (Signed)
Complete NUTRITION RISK SCREEN worksheet without concerns turned into Zenovia Jarred, RD. Also, complete PATIENT MEASURE OF DISTRESS worksheet with a score of 0 submitted to social work.

## 2011-08-04 NOTE — Progress Notes (Signed)
Encounter addended by: Ardell Isaacs, RN on: 08/04/2011 11:36 AM<BR>     Documentation filed: Inpatient Patient Education, Charges VN

## 2011-08-09 ENCOUNTER — Other Ambulatory Visit: Payer: Self-pay | Admitting: Radiation Oncology

## 2011-08-09 ENCOUNTER — Encounter: Payer: Self-pay | Admitting: Radiation Oncology

## 2011-08-09 NOTE — Progress Notes (Signed)
Chart note:   Brian White call me today and he wants to have 8 weeks of external beam/IMRT. We will kindly asked Dr. Patsi Sears to place 3 gold seeds for image guidance. We'll then get him back for simulation/treatment planning.

## 2011-08-14 ENCOUNTER — Telehealth: Payer: Self-pay | Admitting: *Deleted

## 2011-08-18 HISTORY — PX: INSERTION PROSTATE RADIATION SEED: SUR718

## 2011-09-06 NOTE — Telephone Encounter (Signed)
XXX

## 2011-09-07 ENCOUNTER — Ambulatory Visit
Admission: RE | Admit: 2011-09-07 | Discharge: 2011-09-07 | Disposition: A | Discharge status: Home / Self Care | Payer: Managed Care, Other (non HMO) | Source: Ambulatory Visit | Attending: Radiation Oncology | Admitting: Radiation Oncology

## 2011-09-07 DIAGNOSIS — C61 Malignant neoplasm of prostate: Secondary | ICD-10-CM

## 2011-09-07 DIAGNOSIS — I10 Essential (primary) hypertension: Secondary | ICD-10-CM | POA: Diagnosis not present

## 2011-09-07 DIAGNOSIS — R300 Dysuria: Secondary | ICD-10-CM | POA: Diagnosis not present

## 2011-09-07 DIAGNOSIS — I252 Old myocardial infarction: Secondary | ICD-10-CM | POA: Diagnosis not present

## 2011-09-07 DIAGNOSIS — Z510 Encounter for antineoplastic radiation therapy: Secondary | ICD-10-CM | POA: Diagnosis not present

## 2011-09-07 DIAGNOSIS — E7800 Pure hypercholesterolemia, unspecified: Secondary | ICD-10-CM | POA: Diagnosis not present

## 2011-09-07 NOTE — Progress Notes (Signed)
Simulation/treatment planning note: The patient underwent simulation/treatment planning in the management of his carcinoma of the prostate. An alpha cradle was constructed for immobilization. A red rubber tube was placed within the rectal vault. He was then catheterized and contrast instilled into his bladder/urethra. He was then scanned. I chose and isocenter within the center of the prostate. I contoured his prostate (GTV), seminal vesicles, rectum, and bladder. Other avoidance structures including his femoral heads for also contoured. I'm prescribing 7600 cGy in 40 sessions to his prostate PTV and 5006 and her cGy in 40 sessions to his seminal vesicle PTV. He is now ready for IM RT simulation/treatment planning.

## 2011-09-07 NOTE — Progress Notes (Signed)
Met with patient to discuss RO billing.  Attending Rad: [name]  Dx: 185 Malignant neoplasm of prostate  Rad Tx:  40981 x40 IMRT

## 2011-09-11 ENCOUNTER — Encounter: Payer: Self-pay | Admitting: Radiation Oncology

## 2011-09-11 DIAGNOSIS — R300 Dysuria: Secondary | ICD-10-CM | POA: Diagnosis not present

## 2011-09-11 DIAGNOSIS — I252 Old myocardial infarction: Secondary | ICD-10-CM | POA: Diagnosis not present

## 2011-09-11 DIAGNOSIS — C61 Malignant neoplasm of prostate: Secondary | ICD-10-CM | POA: Diagnosis not present

## 2011-09-11 DIAGNOSIS — E7800 Pure hypercholesterolemia, unspecified: Secondary | ICD-10-CM | POA: Diagnosis not present

## 2011-09-11 DIAGNOSIS — Z510 Encounter for antineoplastic radiation therapy: Secondary | ICD-10-CM | POA: Diagnosis not present

## 2011-09-11 DIAGNOSIS — I10 Essential (primary) hypertension: Secondary | ICD-10-CM | POA: Diagnosis not present

## 2011-09-11 NOTE — Progress Notes (Signed)
IMRT simulation/treatment planning note:  The patient underwent IMRT simulation/treatment planning today. IMRT was chosen to decrease the risk for both acute and late bladder and rectal toxicity compared to 3-D conformal or conventional radiation therapy. Dose volume histograms were obtained for the target structures including the prostate/seminal vesicles and also of avoidance structures including the rectum, bladder, and femoral heads. We met our department all goals with respect to our target coverage and also avoidance structures. I prescribing 7600 cGy 40 sessions to his prostate PTV and 5600 cGy in 40 sessions to his seminal vesicle PTV. He'll undergo daily KB imaging, setting up to his 3 gold seeds for image guidance. We'll also obtain a weekly cone beam CT scan to assess his bladder filling. He is to be treated with a comfortably full bladder.

## 2011-09-12 ENCOUNTER — Encounter: Payer: Self-pay | Admitting: *Deleted

## 2011-09-18 ENCOUNTER — Encounter: Payer: Self-pay | Admitting: Radiation Oncology

## 2011-09-18 ENCOUNTER — Ambulatory Visit
Admission: RE | Admit: 2011-09-18 | Discharge: 2011-09-18 | Disposition: A | Discharge status: Home / Self Care | Payer: Managed Care, Other (non HMO) | Source: Ambulatory Visit | Attending: Radiation Oncology | Admitting: Radiation Oncology

## 2011-09-18 VITALS — BP 168/89 | HR 55 | Resp 18 | Wt 225.5 lb

## 2011-09-18 DIAGNOSIS — C61 Malignant neoplasm of prostate: Secondary | ICD-10-CM

## 2011-09-18 DIAGNOSIS — R300 Dysuria: Secondary | ICD-10-CM | POA: Diagnosis not present

## 2011-09-18 DIAGNOSIS — I252 Old myocardial infarction: Secondary | ICD-10-CM | POA: Diagnosis not present

## 2011-09-18 DIAGNOSIS — I10 Essential (primary) hypertension: Secondary | ICD-10-CM | POA: Diagnosis not present

## 2011-09-18 DIAGNOSIS — E7800 Pure hypercholesterolemia, unspecified: Secondary | ICD-10-CM | POA: Diagnosis not present

## 2011-09-18 DIAGNOSIS — Z510 Encounter for antineoplastic radiation therapy: Secondary | ICD-10-CM | POA: Diagnosis not present

## 2011-09-18 NOTE — Progress Notes (Signed)
Weekly Management Note:  Site:Prostate Current Dose:  190  cGy Projected Dose: 7800  cGy  Narrative: The patient is seen today for routine under treatment assessment. CBCT/MVCT images/port films were reviewed. The chart was reviewed.  Bladder filling was suboptimal today. No new GU or GI difficulties. He plans on getting his finasteride renewed per Dr. Patsi Sears.  Physical Examination:  Filed Vitals:   09/18/11 1748  BP: 168/89  Pulse: 55  Resp: 18  .  Weight: 225 lb 8 oz (102.286 kg). No change.  Impression: Tolerating radiation therapy well. His bladder filling was suboptimal and I encouraged him to make a better effort. His wife tells me that he simply forgot to fill his bladder before treatment today.  Plan: Continue radiation therapy as planned.

## 2011-09-18 NOTE — Progress Notes (Signed)
Patient presented to the clinic today for PUT with [name] and post sim education with Sam, RN. Patient is alert and oriented to person, place, and time. No distress noted. Steady gait noted. Pleasant affect noted. Patient denies pain at this time. Oriented patient and his wife to staff and routine of the clinic. Provided patient with RADIATION THERAPY AND YOU handbook then, reviewed pertinent material. Reviewed potential side effects and management. Provided this patient with this writers business card and encouraged to call with needs. Patient verbalized understanding of all things reviewed.Patient denies nausea, vomiting, headache, dizziness, or diarrhea. Patient denies hematuria. Patient reports urgency. Patient reports that he gets up on average 2-3 times per night to void. Patient denies burning with urination. Reported all findings to [name].

## 2011-09-19 ENCOUNTER — Ambulatory Visit
Admission: RE | Admit: 2011-09-19 | Discharge: 2011-09-19 | Disposition: A | Discharge status: Home / Self Care | Payer: Managed Care, Other (non HMO) | Source: Ambulatory Visit | Attending: Radiation Oncology | Admitting: Radiation Oncology

## 2011-09-19 DIAGNOSIS — R300 Dysuria: Secondary | ICD-10-CM | POA: Diagnosis not present

## 2011-09-19 DIAGNOSIS — E7800 Pure hypercholesterolemia, unspecified: Secondary | ICD-10-CM | POA: Diagnosis not present

## 2011-09-19 DIAGNOSIS — I252 Old myocardial infarction: Secondary | ICD-10-CM | POA: Diagnosis not present

## 2011-09-19 DIAGNOSIS — I10 Essential (primary) hypertension: Secondary | ICD-10-CM | POA: Diagnosis not present

## 2011-09-19 DIAGNOSIS — Z510 Encounter for antineoplastic radiation therapy: Secondary | ICD-10-CM | POA: Diagnosis not present

## 2011-09-19 DIAGNOSIS — C61 Malignant neoplasm of prostate: Secondary | ICD-10-CM | POA: Diagnosis not present

## 2011-09-20 ENCOUNTER — Ambulatory Visit
Admission: RE | Admit: 2011-09-20 | Discharge: 2011-09-20 | Disposition: A | Discharge status: Home / Self Care | Payer: Managed Care, Other (non HMO) | Source: Ambulatory Visit | Attending: Radiation Oncology | Admitting: Radiation Oncology

## 2011-09-20 DIAGNOSIS — I252 Old myocardial infarction: Secondary | ICD-10-CM | POA: Diagnosis not present

## 2011-09-20 DIAGNOSIS — R300 Dysuria: Secondary | ICD-10-CM | POA: Diagnosis not present

## 2011-09-20 DIAGNOSIS — Z510 Encounter for antineoplastic radiation therapy: Secondary | ICD-10-CM | POA: Diagnosis not present

## 2011-09-20 DIAGNOSIS — C61 Malignant neoplasm of prostate: Secondary | ICD-10-CM | POA: Diagnosis not present

## 2011-09-20 DIAGNOSIS — E7800 Pure hypercholesterolemia, unspecified: Secondary | ICD-10-CM | POA: Diagnosis not present

## 2011-09-20 DIAGNOSIS — I10 Essential (primary) hypertension: Secondary | ICD-10-CM | POA: Diagnosis not present

## 2011-09-21 ENCOUNTER — Ambulatory Visit
Admission: RE | Admit: 2011-09-21 | Discharge: 2011-09-21 | Disposition: A | Discharge status: Home / Self Care | Payer: Managed Care, Other (non HMO) | Source: Ambulatory Visit | Attending: Radiation Oncology | Admitting: Radiation Oncology

## 2011-09-21 DIAGNOSIS — Z510 Encounter for antineoplastic radiation therapy: Secondary | ICD-10-CM | POA: Diagnosis not present

## 2011-09-21 DIAGNOSIS — C61 Malignant neoplasm of prostate: Secondary | ICD-10-CM | POA: Diagnosis not present

## 2011-09-21 DIAGNOSIS — R300 Dysuria: Secondary | ICD-10-CM | POA: Diagnosis not present

## 2011-09-21 DIAGNOSIS — I252 Old myocardial infarction: Secondary | ICD-10-CM | POA: Diagnosis not present

## 2011-09-21 DIAGNOSIS — E7800 Pure hypercholesterolemia, unspecified: Secondary | ICD-10-CM | POA: Diagnosis not present

## 2011-09-21 DIAGNOSIS — I10 Essential (primary) hypertension: Secondary | ICD-10-CM | POA: Diagnosis not present

## 2011-09-22 ENCOUNTER — Ambulatory Visit
Admission: RE | Admit: 2011-09-22 | Discharge: 2011-09-22 | Disposition: A | Discharge status: Home / Self Care | Payer: Managed Care, Other (non HMO) | Source: Ambulatory Visit | Attending: Radiation Oncology | Admitting: Radiation Oncology

## 2011-09-22 DIAGNOSIS — C61 Malignant neoplasm of prostate: Secondary | ICD-10-CM | POA: Diagnosis not present

## 2011-09-22 DIAGNOSIS — I252 Old myocardial infarction: Secondary | ICD-10-CM | POA: Diagnosis not present

## 2011-09-22 DIAGNOSIS — I10 Essential (primary) hypertension: Secondary | ICD-10-CM | POA: Diagnosis not present

## 2011-09-22 DIAGNOSIS — E7800 Pure hypercholesterolemia, unspecified: Secondary | ICD-10-CM | POA: Diagnosis not present

## 2011-09-22 DIAGNOSIS — R300 Dysuria: Secondary | ICD-10-CM | POA: Diagnosis not present

## 2011-09-22 DIAGNOSIS — Z510 Encounter for antineoplastic radiation therapy: Secondary | ICD-10-CM | POA: Diagnosis not present

## 2011-09-25 ENCOUNTER — Encounter: Payer: Self-pay | Admitting: Radiation Oncology

## 2011-09-25 ENCOUNTER — Ambulatory Visit
Admission: RE | Admit: 2011-09-25 | Discharge: 2011-09-25 | Disposition: A | Discharge status: Home / Self Care | Payer: Managed Care, Other (non HMO) | Source: Ambulatory Visit | Attending: Radiation Oncology | Admitting: Radiation Oncology

## 2011-09-25 VITALS — BP 169/89 | HR 58 | Resp 18 | Wt 220.0 lb

## 2011-09-25 DIAGNOSIS — R300 Dysuria: Secondary | ICD-10-CM | POA: Diagnosis not present

## 2011-09-25 DIAGNOSIS — C61 Malignant neoplasm of prostate: Secondary | ICD-10-CM | POA: Diagnosis not present

## 2011-09-25 DIAGNOSIS — I10 Essential (primary) hypertension: Secondary | ICD-10-CM | POA: Diagnosis not present

## 2011-09-25 DIAGNOSIS — I252 Old myocardial infarction: Secondary | ICD-10-CM | POA: Diagnosis not present

## 2011-09-25 DIAGNOSIS — Z510 Encounter for antineoplastic radiation therapy: Secondary | ICD-10-CM | POA: Diagnosis not present

## 2011-09-25 DIAGNOSIS — E7800 Pure hypercholesterolemia, unspecified: Secondary | ICD-10-CM | POA: Diagnosis not present

## 2011-09-25 NOTE — Progress Notes (Signed)
Weekly Management Note:  Site:Prostate Current Dose:  1140  cGy Projected Dose: 7600  cGy  Narrative: The patient is seen today for routine under treatment assessment. CBCT/MVCT images/port films were reviewed. The chart was reviewed.   His bladder filling is satisfactory, and only 60% of looking be expected. He did not feel as if he had a full bladder today.  Physical Examination:  Filed Vitals:   09/25/11 1605  BP: 169/89  Pulse: 58  Resp: 18  .  Weight: 220 lb (99.791 kg). No change.  Impression: Tolerating radiation therapy well. I encouraged him to improve his bladder filling. He is on a diuretic which makes is sometimes difficult to time his urination. He will start over the morning and adjust his urinary habits.  Plan: Continue radiation therapy as planned.

## 2011-09-25 NOTE — Progress Notes (Signed)
Patient presents to the clinic today unaccompanied for under treat visit with [name]. Patient is alert and oriented to person, place, and time. No distress noted. Steady gait noted. Pleasant affect noted. Patient denies pain at this time. Patient reports urgency. Patient reports getting up 2-3 times per night to void but, not every night. Patient denies burning or hematuria. Patient has no complaints. Reported all findings to [name].

## 2011-09-26 ENCOUNTER — Ambulatory Visit
Admission: RE | Admit: 2011-09-26 | Discharge: 2011-09-26 | Disposition: A | Discharge status: Home / Self Care | Payer: Managed Care, Other (non HMO) | Source: Ambulatory Visit | Attending: Radiation Oncology | Admitting: Radiation Oncology

## 2011-09-26 DIAGNOSIS — Z510 Encounter for antineoplastic radiation therapy: Secondary | ICD-10-CM | POA: Diagnosis not present

## 2011-09-26 DIAGNOSIS — I252 Old myocardial infarction: Secondary | ICD-10-CM | POA: Diagnosis not present

## 2011-09-26 DIAGNOSIS — R300 Dysuria: Secondary | ICD-10-CM | POA: Diagnosis not present

## 2011-09-26 DIAGNOSIS — I10 Essential (primary) hypertension: Secondary | ICD-10-CM | POA: Diagnosis not present

## 2011-09-26 DIAGNOSIS — C61 Malignant neoplasm of prostate: Secondary | ICD-10-CM | POA: Diagnosis not present

## 2011-09-26 DIAGNOSIS — E7800 Pure hypercholesterolemia, unspecified: Secondary | ICD-10-CM | POA: Diagnosis not present

## 2011-09-27 ENCOUNTER — Ambulatory Visit
Admission: RE | Admit: 2011-09-27 | Discharge: 2011-09-27 | Disposition: A | Discharge status: Home / Self Care | Payer: Managed Care, Other (non HMO) | Source: Ambulatory Visit | Attending: Radiation Oncology | Admitting: Radiation Oncology

## 2011-09-27 DIAGNOSIS — Z510 Encounter for antineoplastic radiation therapy: Secondary | ICD-10-CM | POA: Diagnosis not present

## 2011-09-27 DIAGNOSIS — C61 Malignant neoplasm of prostate: Secondary | ICD-10-CM | POA: Diagnosis not present

## 2011-09-27 DIAGNOSIS — E7800 Pure hypercholesterolemia, unspecified: Secondary | ICD-10-CM | POA: Diagnosis not present

## 2011-09-27 DIAGNOSIS — I252 Old myocardial infarction: Secondary | ICD-10-CM | POA: Diagnosis not present

## 2011-09-27 DIAGNOSIS — R300 Dysuria: Secondary | ICD-10-CM | POA: Diagnosis not present

## 2011-09-27 DIAGNOSIS — I10 Essential (primary) hypertension: Secondary | ICD-10-CM | POA: Diagnosis not present

## 2011-09-28 ENCOUNTER — Ambulatory Visit
Admission: RE | Admit: 2011-09-28 | Discharge: 2011-09-28 | Disposition: A | Discharge status: Home / Self Care | Payer: Managed Care, Other (non HMO) | Source: Ambulatory Visit | Attending: Radiation Oncology | Admitting: Radiation Oncology

## 2011-09-28 DIAGNOSIS — I10 Essential (primary) hypertension: Secondary | ICD-10-CM | POA: Diagnosis not present

## 2011-09-28 DIAGNOSIS — C61 Malignant neoplasm of prostate: Secondary | ICD-10-CM | POA: Diagnosis not present

## 2011-09-28 DIAGNOSIS — Z510 Encounter for antineoplastic radiation therapy: Secondary | ICD-10-CM | POA: Diagnosis not present

## 2011-09-28 DIAGNOSIS — R300 Dysuria: Secondary | ICD-10-CM | POA: Diagnosis not present

## 2011-09-28 DIAGNOSIS — E7800 Pure hypercholesterolemia, unspecified: Secondary | ICD-10-CM | POA: Diagnosis not present

## 2011-09-28 DIAGNOSIS — I252 Old myocardial infarction: Secondary | ICD-10-CM | POA: Diagnosis not present

## 2011-09-29 ENCOUNTER — Ambulatory Visit
Admission: RE | Admit: 2011-09-29 | Discharge: 2011-09-29 | Disposition: A | Discharge status: Home / Self Care | Payer: Managed Care, Other (non HMO) | Source: Ambulatory Visit | Attending: Radiation Oncology | Admitting: Radiation Oncology

## 2011-09-29 DIAGNOSIS — E7800 Pure hypercholesterolemia, unspecified: Secondary | ICD-10-CM | POA: Diagnosis not present

## 2011-09-29 DIAGNOSIS — R300 Dysuria: Secondary | ICD-10-CM | POA: Diagnosis not present

## 2011-09-29 DIAGNOSIS — Z510 Encounter for antineoplastic radiation therapy: Secondary | ICD-10-CM | POA: Diagnosis not present

## 2011-09-29 DIAGNOSIS — C61 Malignant neoplasm of prostate: Secondary | ICD-10-CM | POA: Diagnosis not present

## 2011-09-29 DIAGNOSIS — I252 Old myocardial infarction: Secondary | ICD-10-CM | POA: Diagnosis not present

## 2011-09-29 DIAGNOSIS — I10 Essential (primary) hypertension: Secondary | ICD-10-CM | POA: Diagnosis not present

## 2011-10-02 ENCOUNTER — Telehealth: Payer: Self-pay | Admitting: Radiation Oncology

## 2011-10-02 ENCOUNTER — Ambulatory Visit
Admission: RE | Admit: 2011-10-02 | Discharge: 2011-10-02 | Disposition: A | Discharge status: Home / Self Care | Payer: Managed Care, Other (non HMO) | Source: Ambulatory Visit | Attending: Radiation Oncology | Admitting: Radiation Oncology

## 2011-10-02 DIAGNOSIS — I10 Essential (primary) hypertension: Secondary | ICD-10-CM | POA: Diagnosis not present

## 2011-10-02 DIAGNOSIS — R300 Dysuria: Secondary | ICD-10-CM | POA: Diagnosis not present

## 2011-10-02 DIAGNOSIS — E7800 Pure hypercholesterolemia, unspecified: Secondary | ICD-10-CM | POA: Diagnosis not present

## 2011-10-02 DIAGNOSIS — C61 Malignant neoplasm of prostate: Secondary | ICD-10-CM | POA: Diagnosis not present

## 2011-10-02 DIAGNOSIS — Z510 Encounter for antineoplastic radiation therapy: Secondary | ICD-10-CM | POA: Diagnosis not present

## 2011-10-02 DIAGNOSIS — I252 Old myocardial infarction: Secondary | ICD-10-CM | POA: Diagnosis not present

## 2011-10-02 NOTE — Telephone Encounter (Signed)
Brian White Stopped in hallway today by this patient. Patient reports burning with urination. Patient request [name] call in prescription to [facility] on [address]. Instructed patient this Clinical research associate would inform [name] of these findings and call him with the response. 1223 Per Dr. Rennie Plowman order phoned patient at home. Informed patient he would be seen tomorrow by [name] following treatment. Patient denies allergies. Instructed patient to take motrin today to ease burning. Informed patient [name] may write script tomorrow after evaluation. Patient verbalized understanding of all things reviewed.

## 2011-10-03 ENCOUNTER — Ambulatory Visit
Admission: RE | Admit: 2011-10-03 | Discharge: 2011-10-03 | Disposition: A | Discharge status: Home / Self Care | Payer: Managed Care, Other (non HMO) | Source: Ambulatory Visit | Attending: Radiation Oncology | Admitting: Radiation Oncology

## 2011-10-03 ENCOUNTER — Encounter: Payer: Self-pay | Admitting: Radiation Oncology

## 2011-10-03 VITALS — BP 147/79 | HR 55 | Resp 18 | Wt 223.2 lb

## 2011-10-03 DIAGNOSIS — E7800 Pure hypercholesterolemia, unspecified: Secondary | ICD-10-CM | POA: Diagnosis not present

## 2011-10-03 DIAGNOSIS — Z510 Encounter for antineoplastic radiation therapy: Secondary | ICD-10-CM | POA: Diagnosis not present

## 2011-10-03 DIAGNOSIS — C61 Malignant neoplasm of prostate: Secondary | ICD-10-CM

## 2011-10-03 DIAGNOSIS — I252 Old myocardial infarction: Secondary | ICD-10-CM | POA: Diagnosis not present

## 2011-10-03 DIAGNOSIS — I10 Essential (primary) hypertension: Secondary | ICD-10-CM | POA: Diagnosis not present

## 2011-10-03 DIAGNOSIS — R300 Dysuria: Secondary | ICD-10-CM | POA: Diagnosis not present

## 2011-10-03 MED ORDER — PHENAZOPYRIDINE HCL 200 MG PO TABS: 200.0000 mg | ORAL_TABLET | Freq: Three times a day (TID) | ORAL | Status: AC | PRN

## 2011-10-03 NOTE — Progress Notes (Signed)
Patient presents to the clinic today unaccompanied for under treat visit with [name]. Patient is alert and oriented to person, place, and time. No distress noted. Steady gait noted. Pleasant affect noted. Patient denies pain at this time. Patient reports burning with urination. Patient reports an episode of diarrhea Friday night but, not since. Patient reports that he frequently get the urge to have a bowel movement and void at the same time. Patient reports getting up 3-4 times on an average night to void. Reported all findings to [name].

## 2011-10-03 NOTE — Progress Notes (Signed)
Weekly Management Note:  Site:Prostate Current Dose:  2280  cGy Projected Dose: 7600  cGy  Narrative: The patient is seen today for routine under treatment assessment. CBCT/MVCT images/port films were reviewed. The chart was reviewed.   Good bladder filling. Having more dysuria with nocturia x 3-4. Had some diarrhea last Friday, now resolved. Physical Examination:  Filed Vitals:   10/03/11 0925  BP: 147/79  Pulse: 55  Resp: 18  .  Weight: 223 lb 3.2 oz (101.243 kg). No change.  Impression: Tolerating radiation therapy well, although mild radiation urethritis. Doubt infection.  Plan: Continue radiation therapy as planned.Will start Pyridium.

## 2011-10-04 ENCOUNTER — Ambulatory Visit
Admission: RE | Admit: 2011-10-04 | Discharge: 2011-10-04 | Disposition: A | Discharge status: Home / Self Care | Payer: Managed Care, Other (non HMO) | Source: Ambulatory Visit | Attending: Radiation Oncology | Admitting: Radiation Oncology

## 2011-10-04 DIAGNOSIS — R300 Dysuria: Secondary | ICD-10-CM | POA: Diagnosis not present

## 2011-10-04 DIAGNOSIS — I252 Old myocardial infarction: Secondary | ICD-10-CM | POA: Diagnosis not present

## 2011-10-04 DIAGNOSIS — E7800 Pure hypercholesterolemia, unspecified: Secondary | ICD-10-CM | POA: Diagnosis not present

## 2011-10-04 DIAGNOSIS — C61 Malignant neoplasm of prostate: Secondary | ICD-10-CM | POA: Diagnosis not present

## 2011-10-04 DIAGNOSIS — I10 Essential (primary) hypertension: Secondary | ICD-10-CM | POA: Diagnosis not present

## 2011-10-04 DIAGNOSIS — Z510 Encounter for antineoplastic radiation therapy: Secondary | ICD-10-CM | POA: Diagnosis not present

## 2011-10-05 ENCOUNTER — Ambulatory Visit
Admission: RE | Admit: 2011-10-05 | Discharge: 2011-10-05 | Disposition: A | Discharge status: Home / Self Care | Payer: Managed Care, Other (non HMO) | Source: Ambulatory Visit | Attending: Radiation Oncology | Admitting: Radiation Oncology

## 2011-10-05 DIAGNOSIS — I10 Essential (primary) hypertension: Secondary | ICD-10-CM | POA: Diagnosis not present

## 2011-10-05 DIAGNOSIS — R300 Dysuria: Secondary | ICD-10-CM | POA: Diagnosis not present

## 2011-10-05 DIAGNOSIS — C61 Malignant neoplasm of prostate: Secondary | ICD-10-CM | POA: Diagnosis not present

## 2011-10-05 DIAGNOSIS — I252 Old myocardial infarction: Secondary | ICD-10-CM | POA: Diagnosis not present

## 2011-10-05 DIAGNOSIS — Z510 Encounter for antineoplastic radiation therapy: Secondary | ICD-10-CM | POA: Diagnosis not present

## 2011-10-05 DIAGNOSIS — E7800 Pure hypercholesterolemia, unspecified: Secondary | ICD-10-CM | POA: Diagnosis not present

## 2011-10-06 ENCOUNTER — Ambulatory Visit
Admission: RE | Admit: 2011-10-06 | Discharge: 2011-10-06 | Disposition: A | Discharge status: Home / Self Care | Payer: Managed Care, Other (non HMO) | Source: Ambulatory Visit | Attending: Radiation Oncology | Admitting: Radiation Oncology

## 2011-10-06 DIAGNOSIS — Z510 Encounter for antineoplastic radiation therapy: Secondary | ICD-10-CM | POA: Diagnosis not present

## 2011-10-06 DIAGNOSIS — E7800 Pure hypercholesterolemia, unspecified: Secondary | ICD-10-CM | POA: Diagnosis not present

## 2011-10-06 DIAGNOSIS — C61 Malignant neoplasm of prostate: Secondary | ICD-10-CM | POA: Diagnosis not present

## 2011-10-06 DIAGNOSIS — I10 Essential (primary) hypertension: Secondary | ICD-10-CM | POA: Diagnosis not present

## 2011-10-06 DIAGNOSIS — R300 Dysuria: Secondary | ICD-10-CM | POA: Diagnosis not present

## 2011-10-06 DIAGNOSIS — I252 Old myocardial infarction: Secondary | ICD-10-CM | POA: Diagnosis not present

## 2011-10-09 ENCOUNTER — Ambulatory Visit
Admission: RE | Admit: 2011-10-09 | Discharge: 2011-10-09 | Disposition: A | Discharge status: Home / Self Care | Payer: Managed Care, Other (non HMO) | Source: Ambulatory Visit | Attending: Radiation Oncology | Admitting: Radiation Oncology

## 2011-10-09 ENCOUNTER — Encounter: Payer: Self-pay | Admitting: Radiation Oncology

## 2011-10-09 VITALS — BP 142/78 | HR 55 | Resp 18 | Wt 223.2 lb

## 2011-10-09 DIAGNOSIS — C61 Malignant neoplasm of prostate: Secondary | ICD-10-CM | POA: Diagnosis not present

## 2011-10-09 DIAGNOSIS — R300 Dysuria: Secondary | ICD-10-CM | POA: Diagnosis not present

## 2011-10-09 DIAGNOSIS — I10 Essential (primary) hypertension: Secondary | ICD-10-CM | POA: Diagnosis not present

## 2011-10-09 DIAGNOSIS — I252 Old myocardial infarction: Secondary | ICD-10-CM | POA: Diagnosis not present

## 2011-10-09 DIAGNOSIS — Z510 Encounter for antineoplastic radiation therapy: Secondary | ICD-10-CM | POA: Diagnosis not present

## 2011-10-09 DIAGNOSIS — E7800 Pure hypercholesterolemia, unspecified: Secondary | ICD-10-CM | POA: Diagnosis not present

## 2011-10-09 MED ORDER — TAMSULOSIN HCL 0.4 MG PO CAPS: 0.4000 mg | ORAL_CAPSULE | Freq: Every day | ORAL | Status: DC

## 2011-10-09 NOTE — Progress Notes (Signed)
Patient presents to the clinic today unaccompanied for an under treat visit with [name]. Patient is alert and oriented to person, place, and time. No distress noted. Steady gait noted. Pleasant affect noted. Patient denies pain at this time. Patient reports burning with urination. Patient denies hematuria. Patient reports a weak urine stream. Patient reports that on average he gets up every 1.5-2 hours to void. Patient reports this is interrupting his sleep pattern and causing his energy level to decline. Patient denies nausea, vomiting, headache, or dizziness. Patient reports diarrhea this morning. Reports all findings to [name].

## 2011-10-09 NOTE — Progress Notes (Signed)
Weekly Management Note:  Site:Prostate Current Dose:  3040  cGy Projected Dose: 7600  cGy  Narrative: The patient is seen today for routine under treatment assessment. CBCT/MVCT images/port films were reviewed. The chart was reviewed. Bladder filling is excellent.  Brian White is now having more nocturia, every one to 2 hours. His urinary stream is slow. Brian White also has slight dysuria. No significant GI problems.  Physical Examination:  Filed Vitals:   10/09/11 0957  BP: 142/78  Pulse: 55  Resp: 18  .  Weight: 223 lb 3.2 oz (101.243 kg). No change.  Impression: Tolerating radiation therapy well. However, I believe that Brian White is developing obstructive urinary symptoms.  Plan: Continue radiation therapy as planned. I will get him started on Flomax.

## 2011-10-10 ENCOUNTER — Ambulatory Visit
Admission: RE | Admit: 2011-10-10 | Discharge: 2011-10-10 | Disposition: A | Discharge status: Home / Self Care | Payer: Managed Care, Other (non HMO) | Source: Ambulatory Visit | Attending: Radiation Oncology | Admitting: Radiation Oncology

## 2011-10-10 DIAGNOSIS — C61 Malignant neoplasm of prostate: Secondary | ICD-10-CM | POA: Diagnosis not present

## 2011-10-10 DIAGNOSIS — I252 Old myocardial infarction: Secondary | ICD-10-CM | POA: Diagnosis not present

## 2011-10-10 DIAGNOSIS — I10 Essential (primary) hypertension: Secondary | ICD-10-CM | POA: Diagnosis not present

## 2011-10-10 DIAGNOSIS — E7800 Pure hypercholesterolemia, unspecified: Secondary | ICD-10-CM | POA: Diagnosis not present

## 2011-10-10 DIAGNOSIS — R300 Dysuria: Secondary | ICD-10-CM | POA: Diagnosis not present

## 2011-10-10 DIAGNOSIS — Z510 Encounter for antineoplastic radiation therapy: Secondary | ICD-10-CM | POA: Diagnosis not present

## 2011-10-11 ENCOUNTER — Ambulatory Visit
Admission: RE | Admit: 2011-10-11 | Discharge: 2011-10-11 | Disposition: A | Discharge status: Home / Self Care | Payer: Managed Care, Other (non HMO) | Source: Ambulatory Visit | Attending: Radiation Oncology | Admitting: Radiation Oncology

## 2011-10-11 DIAGNOSIS — E7800 Pure hypercholesterolemia, unspecified: Secondary | ICD-10-CM | POA: Diagnosis not present

## 2011-10-11 DIAGNOSIS — R300 Dysuria: Secondary | ICD-10-CM | POA: Diagnosis not present

## 2011-10-11 DIAGNOSIS — I252 Old myocardial infarction: Secondary | ICD-10-CM | POA: Diagnosis not present

## 2011-10-11 DIAGNOSIS — C61 Malignant neoplasm of prostate: Secondary | ICD-10-CM | POA: Diagnosis not present

## 2011-10-11 DIAGNOSIS — I10 Essential (primary) hypertension: Secondary | ICD-10-CM | POA: Diagnosis not present

## 2011-10-11 DIAGNOSIS — Z510 Encounter for antineoplastic radiation therapy: Secondary | ICD-10-CM | POA: Diagnosis not present

## 2011-10-12 ENCOUNTER — Ambulatory Visit
Admission: RE | Admit: 2011-10-12 | Discharge: 2011-10-12 | Disposition: A | Discharge status: Home / Self Care | Payer: Managed Care, Other (non HMO) | Source: Ambulatory Visit | Attending: Radiation Oncology | Admitting: Radiation Oncology

## 2011-10-12 DIAGNOSIS — R300 Dysuria: Secondary | ICD-10-CM | POA: Diagnosis not present

## 2011-10-12 DIAGNOSIS — I252 Old myocardial infarction: Secondary | ICD-10-CM | POA: Diagnosis not present

## 2011-10-12 DIAGNOSIS — Z510 Encounter for antineoplastic radiation therapy: Secondary | ICD-10-CM | POA: Diagnosis not present

## 2011-10-12 DIAGNOSIS — I10 Essential (primary) hypertension: Secondary | ICD-10-CM | POA: Diagnosis not present

## 2011-10-12 DIAGNOSIS — E7800 Pure hypercholesterolemia, unspecified: Secondary | ICD-10-CM | POA: Diagnosis not present

## 2011-10-12 DIAGNOSIS — C61 Malignant neoplasm of prostate: Secondary | ICD-10-CM | POA: Diagnosis not present

## 2011-10-13 ENCOUNTER — Ambulatory Visit
Admission: RE | Admit: 2011-10-13 | Discharge: 2011-10-13 | Disposition: A | Discharge status: Home / Self Care | Payer: Managed Care, Other (non HMO) | Source: Ambulatory Visit | Attending: Radiation Oncology | Admitting: Radiation Oncology

## 2011-10-13 DIAGNOSIS — I252 Old myocardial infarction: Secondary | ICD-10-CM | POA: Diagnosis not present

## 2011-10-13 DIAGNOSIS — Z510 Encounter for antineoplastic radiation therapy: Secondary | ICD-10-CM | POA: Diagnosis not present

## 2011-10-13 DIAGNOSIS — R300 Dysuria: Secondary | ICD-10-CM | POA: Diagnosis not present

## 2011-10-13 DIAGNOSIS — C61 Malignant neoplasm of prostate: Secondary | ICD-10-CM | POA: Diagnosis not present

## 2011-10-13 DIAGNOSIS — I10 Essential (primary) hypertension: Secondary | ICD-10-CM | POA: Diagnosis not present

## 2011-10-13 DIAGNOSIS — E7800 Pure hypercholesterolemia, unspecified: Secondary | ICD-10-CM | POA: Diagnosis not present

## 2011-10-16 ENCOUNTER — Ambulatory Visit
Admission: RE | Admit: 2011-10-16 | Discharge: 2011-10-16 | Disposition: A | Discharge status: Home / Self Care | Payer: Managed Care, Other (non HMO) | Source: Ambulatory Visit | Attending: Radiation Oncology | Admitting: Radiation Oncology

## 2011-10-16 ENCOUNTER — Encounter: Payer: Self-pay | Admitting: Radiation Oncology

## 2011-10-16 VITALS — BP 139/76 | HR 59 | Resp 18 | Wt 223.7 lb

## 2011-10-16 DIAGNOSIS — E7800 Pure hypercholesterolemia, unspecified: Secondary | ICD-10-CM | POA: Diagnosis not present

## 2011-10-16 DIAGNOSIS — C61 Malignant neoplasm of prostate: Secondary | ICD-10-CM | POA: Diagnosis not present

## 2011-10-16 DIAGNOSIS — R300 Dysuria: Secondary | ICD-10-CM | POA: Diagnosis not present

## 2011-10-16 DIAGNOSIS — I252 Old myocardial infarction: Secondary | ICD-10-CM | POA: Diagnosis not present

## 2011-10-16 DIAGNOSIS — Z510 Encounter for antineoplastic radiation therapy: Secondary | ICD-10-CM | POA: Diagnosis not present

## 2011-10-16 DIAGNOSIS — I10 Essential (primary) hypertension: Secondary | ICD-10-CM | POA: Diagnosis not present

## 2011-10-16 NOTE — Progress Notes (Signed)
Patient presents to the clinic today unaccompanied for under treat visit with Dr. Basilio Cairo. Patient is alert and oriented to person, place, and time. No distress noted. Steady gait noted. Pleasant affect noted. Patient denies pain at this time. Patient reports burning with urination has decreased with pyridium. Patient denies hematuria. Patient reports that on average he gets up every three hours during the night to void. Reported all findings to [name].

## 2011-10-16 NOTE — Progress Notes (Signed)
   Weekly Management Note Current Dose:  3990 cGy  Projected Dose: 7600 cGy   Narrative:  The patient presents for routine under treatment assessment.  CBCT/MVCT images/Port film x-rays were reviewed.  The chart was checked. He is doing well. He reports that his urinary frequency has improved with Flomax. He now urinates every 3 hours compared every one hour before. Burning with urination has decreased with pyridium.  Physical Findings: Weight: 223 lb 11.2 oz (101.47 kg). He is in no acute distress.  Impression:  The patient is tolerating radiotherapy.  Plan:  Continue radiotherapy as planned.

## 2011-10-17 ENCOUNTER — Ambulatory Visit
Admission: RE | Admit: 2011-10-17 | Discharge: 2011-10-17 | Disposition: A | Discharge status: Home / Self Care | Payer: Managed Care, Other (non HMO) | Source: Ambulatory Visit | Attending: Radiation Oncology | Admitting: Radiation Oncology

## 2011-10-17 DIAGNOSIS — I10 Essential (primary) hypertension: Secondary | ICD-10-CM | POA: Diagnosis not present

## 2011-10-17 DIAGNOSIS — Z510 Encounter for antineoplastic radiation therapy: Secondary | ICD-10-CM | POA: Diagnosis not present

## 2011-10-17 DIAGNOSIS — C61 Malignant neoplasm of prostate: Secondary | ICD-10-CM | POA: Diagnosis not present

## 2011-10-17 DIAGNOSIS — I252 Old myocardial infarction: Secondary | ICD-10-CM | POA: Diagnosis not present

## 2011-10-17 DIAGNOSIS — E7800 Pure hypercholesterolemia, unspecified: Secondary | ICD-10-CM | POA: Diagnosis not present

## 2011-10-17 DIAGNOSIS — R300 Dysuria: Secondary | ICD-10-CM | POA: Diagnosis not present

## 2011-10-18 ENCOUNTER — Ambulatory Visit
Admission: RE | Admit: 2011-10-18 | Discharge: 2011-10-18 | Disposition: A | Discharge status: Home / Self Care | Payer: Managed Care, Other (non HMO) | Source: Ambulatory Visit | Attending: Radiation Oncology | Admitting: Radiation Oncology

## 2011-10-18 DIAGNOSIS — I252 Old myocardial infarction: Secondary | ICD-10-CM | POA: Diagnosis not present

## 2011-10-18 DIAGNOSIS — C61 Malignant neoplasm of prostate: Secondary | ICD-10-CM | POA: Diagnosis not present

## 2011-10-18 DIAGNOSIS — I10 Essential (primary) hypertension: Secondary | ICD-10-CM | POA: Diagnosis not present

## 2011-10-18 DIAGNOSIS — R300 Dysuria: Secondary | ICD-10-CM | POA: Diagnosis not present

## 2011-10-18 DIAGNOSIS — Z510 Encounter for antineoplastic radiation therapy: Secondary | ICD-10-CM | POA: Diagnosis not present

## 2011-10-18 DIAGNOSIS — E7800 Pure hypercholesterolemia, unspecified: Secondary | ICD-10-CM | POA: Diagnosis not present

## 2011-10-19 ENCOUNTER — Ambulatory Visit
Admission: RE | Admit: 2011-10-19 | Discharge: 2011-10-19 | Disposition: A | Discharge status: Home / Self Care | Payer: Managed Care, Other (non HMO) | Source: Ambulatory Visit | Attending: Radiation Oncology | Admitting: Radiation Oncology

## 2011-10-19 DIAGNOSIS — I10 Essential (primary) hypertension: Secondary | ICD-10-CM | POA: Diagnosis not present

## 2011-10-19 DIAGNOSIS — Z510 Encounter for antineoplastic radiation therapy: Secondary | ICD-10-CM | POA: Diagnosis not present

## 2011-10-19 DIAGNOSIS — I252 Old myocardial infarction: Secondary | ICD-10-CM | POA: Diagnosis not present

## 2011-10-19 DIAGNOSIS — E7800 Pure hypercholesterolemia, unspecified: Secondary | ICD-10-CM | POA: Diagnosis not present

## 2011-10-19 DIAGNOSIS — R300 Dysuria: Secondary | ICD-10-CM | POA: Diagnosis not present

## 2011-10-19 DIAGNOSIS — C61 Malignant neoplasm of prostate: Secondary | ICD-10-CM | POA: Diagnosis not present

## 2011-10-20 ENCOUNTER — Ambulatory Visit
Admission: RE | Admit: 2011-10-20 | Discharge: 2011-10-20 | Disposition: A | Discharge status: Home / Self Care | Payer: Managed Care, Other (non HMO) | Source: Ambulatory Visit | Attending: Radiation Oncology | Admitting: Radiation Oncology

## 2011-10-20 DIAGNOSIS — I252 Old myocardial infarction: Secondary | ICD-10-CM | POA: Diagnosis not present

## 2011-10-20 DIAGNOSIS — E7800 Pure hypercholesterolemia, unspecified: Secondary | ICD-10-CM | POA: Diagnosis not present

## 2011-10-20 DIAGNOSIS — I10 Essential (primary) hypertension: Secondary | ICD-10-CM | POA: Diagnosis not present

## 2011-10-20 DIAGNOSIS — C61 Malignant neoplasm of prostate: Secondary | ICD-10-CM | POA: Diagnosis not present

## 2011-10-20 DIAGNOSIS — R300 Dysuria: Secondary | ICD-10-CM | POA: Diagnosis not present

## 2011-10-20 DIAGNOSIS — Z510 Encounter for antineoplastic radiation therapy: Secondary | ICD-10-CM | POA: Diagnosis not present

## 2011-10-23 ENCOUNTER — Ambulatory Visit
Admission: RE | Admit: 2011-10-23 | Discharge: 2011-10-23 | Disposition: A | Discharge status: Home / Self Care | Payer: Managed Care, Other (non HMO) | Source: Ambulatory Visit | Attending: Radiation Oncology | Admitting: Radiation Oncology

## 2011-10-23 DIAGNOSIS — Z510 Encounter for antineoplastic radiation therapy: Secondary | ICD-10-CM | POA: Diagnosis not present

## 2011-10-23 DIAGNOSIS — I10 Essential (primary) hypertension: Secondary | ICD-10-CM | POA: Diagnosis not present

## 2011-10-23 DIAGNOSIS — I252 Old myocardial infarction: Secondary | ICD-10-CM | POA: Diagnosis not present

## 2011-10-23 DIAGNOSIS — R300 Dysuria: Secondary | ICD-10-CM | POA: Diagnosis not present

## 2011-10-23 DIAGNOSIS — C61 Malignant neoplasm of prostate: Secondary | ICD-10-CM | POA: Diagnosis not present

## 2011-10-23 DIAGNOSIS — E7800 Pure hypercholesterolemia, unspecified: Secondary | ICD-10-CM | POA: Diagnosis not present

## 2011-10-24 ENCOUNTER — Ambulatory Visit
Admission: RE | Admit: 2011-10-24 | Discharge: 2011-10-24 | Disposition: A | Discharge status: Home / Self Care | Payer: Managed Care, Other (non HMO) | Source: Ambulatory Visit | Attending: Radiation Oncology | Admitting: Radiation Oncology

## 2011-10-24 ENCOUNTER — Encounter: Payer: Self-pay | Admitting: Radiation Oncology

## 2011-10-24 VITALS — BP 129/70 | HR 59 | Resp 18 | Wt 220.4 lb

## 2011-10-24 DIAGNOSIS — I252 Old myocardial infarction: Secondary | ICD-10-CM | POA: Diagnosis not present

## 2011-10-24 DIAGNOSIS — C61 Malignant neoplasm of prostate: Secondary | ICD-10-CM | POA: Diagnosis not present

## 2011-10-24 DIAGNOSIS — Z510 Encounter for antineoplastic radiation therapy: Secondary | ICD-10-CM | POA: Diagnosis not present

## 2011-10-24 DIAGNOSIS — I10 Essential (primary) hypertension: Secondary | ICD-10-CM | POA: Diagnosis not present

## 2011-10-24 DIAGNOSIS — E7800 Pure hypercholesterolemia, unspecified: Secondary | ICD-10-CM | POA: Diagnosis not present

## 2011-10-24 DIAGNOSIS — R300 Dysuria: Secondary | ICD-10-CM | POA: Diagnosis not present

## 2011-10-24 NOTE — Progress Notes (Signed)
Patient presents to the clinic today for an under treat visit with [name]. Patient is alert and oriented to person, place, and time. No distress noted. Steady gait noted. Pleasant affect noted. Patient denies pain at this time. Patient denies burning with urination. Patient denies hematuria. Patient reports that on average he gets up once maybe twice to void during the night which is a big improvement. Patient reports, "I feel the best I have felt." Patient denies diarrhea. Patient reports a strong or "normal" urine stream. Patient curious if he can take Aleve and Claritin OTC. Reported all findings to [name].

## 2011-10-24 NOTE — Progress Notes (Signed)
Weekly Management Note:  Site:Prostate Current Dose:  5130  cGy Projected Dose: 7600  cGy  Narrative: The patient is seen today for routine under treatment assessment. CBCT/MVCT images/port films were reviewed. The chart was reviewed.   Satisfactory bladder filling. No GU or GI difficulties.  Physical Examination:  Filed Vitals:   10/24/11 0932  BP: 129/70  Pulse: 59  Resp: 18  .  Weight: 220 lb 6.4 oz (99.973 kg). No change.  Impression: Tolerating radiation therapy well.  Plan: Continue radiation therapy as planned.

## 2011-10-25 ENCOUNTER — Ambulatory Visit
Admission: RE | Admit: 2011-10-25 | Discharge: 2011-10-25 | Disposition: A | Discharge status: Home / Self Care | Payer: Managed Care, Other (non HMO) | Source: Ambulatory Visit | Attending: Radiation Oncology | Admitting: Radiation Oncology

## 2011-10-25 DIAGNOSIS — Z510 Encounter for antineoplastic radiation therapy: Secondary | ICD-10-CM | POA: Diagnosis not present

## 2011-10-25 DIAGNOSIS — E7800 Pure hypercholesterolemia, unspecified: Secondary | ICD-10-CM | POA: Diagnosis not present

## 2011-10-25 DIAGNOSIS — C61 Malignant neoplasm of prostate: Secondary | ICD-10-CM | POA: Diagnosis not present

## 2011-10-25 DIAGNOSIS — R300 Dysuria: Secondary | ICD-10-CM | POA: Diagnosis not present

## 2011-10-25 DIAGNOSIS — I10 Essential (primary) hypertension: Secondary | ICD-10-CM | POA: Diagnosis not present

## 2011-10-25 DIAGNOSIS — I252 Old myocardial infarction: Secondary | ICD-10-CM | POA: Diagnosis not present

## 2011-10-26 ENCOUNTER — Ambulatory Visit
Admission: RE | Admit: 2011-10-26 | Discharge: 2011-10-26 | Disposition: A | Discharge status: Home / Self Care | Payer: Managed Care, Other (non HMO) | Source: Ambulatory Visit | Attending: Radiation Oncology | Admitting: Radiation Oncology

## 2011-10-26 DIAGNOSIS — I252 Old myocardial infarction: Secondary | ICD-10-CM | POA: Diagnosis not present

## 2011-10-26 DIAGNOSIS — C61 Malignant neoplasm of prostate: Secondary | ICD-10-CM | POA: Diagnosis not present

## 2011-10-26 DIAGNOSIS — E7800 Pure hypercholesterolemia, unspecified: Secondary | ICD-10-CM | POA: Diagnosis not present

## 2011-10-26 DIAGNOSIS — I10 Essential (primary) hypertension: Secondary | ICD-10-CM | POA: Diagnosis not present

## 2011-10-26 DIAGNOSIS — Z510 Encounter for antineoplastic radiation therapy: Secondary | ICD-10-CM | POA: Diagnosis not present

## 2011-10-26 DIAGNOSIS — R300 Dysuria: Secondary | ICD-10-CM | POA: Diagnosis not present

## 2011-10-27 ENCOUNTER — Ambulatory Visit
Admission: RE | Admit: 2011-10-27 | Discharge: 2011-10-27 | Disposition: A | Discharge status: Home / Self Care | Payer: Managed Care, Other (non HMO) | Source: Ambulatory Visit | Attending: Radiation Oncology | Admitting: Radiation Oncology

## 2011-10-27 DIAGNOSIS — Z510 Encounter for antineoplastic radiation therapy: Secondary | ICD-10-CM | POA: Diagnosis not present

## 2011-10-27 DIAGNOSIS — I252 Old myocardial infarction: Secondary | ICD-10-CM | POA: Diagnosis not present

## 2011-10-27 DIAGNOSIS — I10 Essential (primary) hypertension: Secondary | ICD-10-CM | POA: Diagnosis not present

## 2011-10-27 DIAGNOSIS — R300 Dysuria: Secondary | ICD-10-CM | POA: Diagnosis not present

## 2011-10-27 DIAGNOSIS — E7800 Pure hypercholesterolemia, unspecified: Secondary | ICD-10-CM | POA: Diagnosis not present

## 2011-10-27 DIAGNOSIS — C61 Malignant neoplasm of prostate: Secondary | ICD-10-CM | POA: Diagnosis not present

## 2011-10-30 ENCOUNTER — Ambulatory Visit
Admission: RE | Admit: 2011-10-30 | Discharge: 2011-10-30 | Disposition: A | Discharge status: Home / Self Care | Payer: Managed Care, Other (non HMO) | Source: Ambulatory Visit | Attending: Radiation Oncology | Admitting: Radiation Oncology

## 2011-10-30 DIAGNOSIS — C61 Malignant neoplasm of prostate: Secondary | ICD-10-CM | POA: Diagnosis not present

## 2011-10-30 DIAGNOSIS — I10 Essential (primary) hypertension: Secondary | ICD-10-CM | POA: Diagnosis not present

## 2011-10-30 DIAGNOSIS — R300 Dysuria: Secondary | ICD-10-CM | POA: Diagnosis not present

## 2011-10-30 DIAGNOSIS — I252 Old myocardial infarction: Secondary | ICD-10-CM | POA: Diagnosis not present

## 2011-10-30 DIAGNOSIS — E7800 Pure hypercholesterolemia, unspecified: Secondary | ICD-10-CM | POA: Diagnosis not present

## 2011-10-30 DIAGNOSIS — Z510 Encounter for antineoplastic radiation therapy: Secondary | ICD-10-CM | POA: Diagnosis not present

## 2011-10-31 ENCOUNTER — Encounter: Payer: Self-pay | Admitting: Radiation Oncology

## 2011-10-31 ENCOUNTER — Ambulatory Visit
Admission: RE | Admit: 2011-10-31 | Discharge: 2011-10-31 | Disposition: A | Discharge status: Home / Self Care | Payer: Managed Care, Other (non HMO) | Source: Ambulatory Visit | Attending: Radiation Oncology | Admitting: Radiation Oncology

## 2011-10-31 VITALS — BP 133/74 | HR 51 | Resp 18 | Wt 219.3 lb

## 2011-10-31 DIAGNOSIS — R351 Nocturia: Secondary | ICD-10-CM | POA: Insufficient documentation

## 2011-10-31 DIAGNOSIS — Z809 Family history of malignant neoplasm, unspecified: Secondary | ICD-10-CM | POA: Insufficient documentation

## 2011-10-31 DIAGNOSIS — R300 Dysuria: Secondary | ICD-10-CM | POA: Insufficient documentation

## 2011-10-31 DIAGNOSIS — Z510 Encounter for antineoplastic radiation therapy: Secondary | ICD-10-CM | POA: Insufficient documentation

## 2011-10-31 DIAGNOSIS — C61 Malignant neoplasm of prostate: Secondary | ICD-10-CM

## 2011-10-31 DIAGNOSIS — E7800 Pure hypercholesterolemia, unspecified: Secondary | ICD-10-CM | POA: Insufficient documentation

## 2011-10-31 DIAGNOSIS — I10 Essential (primary) hypertension: Secondary | ICD-10-CM | POA: Insufficient documentation

## 2011-10-31 DIAGNOSIS — Z7982 Long term (current) use of aspirin: Secondary | ICD-10-CM | POA: Insufficient documentation

## 2011-10-31 DIAGNOSIS — Z79899 Other long term (current) drug therapy: Secondary | ICD-10-CM | POA: Insufficient documentation

## 2011-10-31 DIAGNOSIS — I252 Old myocardial infarction: Secondary | ICD-10-CM | POA: Insufficient documentation

## 2011-10-31 NOTE — Progress Notes (Signed)
Weekly Management Note:  Site:Prostate Current Dose:  6080  cGy Projected Dose: 7600  cGy  Narrative: The patient is seen today for routine under treatment assessment. CBCT/MVCT images/port films were reviewed. The chart was reviewed.  Bladder filling on his conebeam CT has been excellent. No new complaints today. He does have mild dysuria but has stopped his Pyridium. He continues with his Flomax. No GI difficulties.  Physical Examination:  Filed Vitals:   10/31/11 0933  BP: 133/74  Pulse: 51  Resp: 18  .  Weight: 219 lb 4.8 oz (99.474 kg). No change.  Impression: Tolerating radiation therapy well.  Plan: Continue radiation therapy as planned.

## 2011-10-31 NOTE — Progress Notes (Signed)
Patient presents to the clinic today unaccompanied for an under treat visit with [name]. Patient is alert and oriented to person, place, and time. No distress noted. Steady gait noted. Pleasant affect noted. Patient denies pain at this time. Patient reports that he has noted a decline in his energy level. Weight remains stable. Patient reports that he voids during the night on average 1-2 times. Patient reports faint burning with urination. Patient stopped taking pyridium this morning concerned "its making me burn more." Patient continues to take Flomax as directed. Patient denies diarrhea. Patient has no other complaints at this time. Reported all findings to [name].

## 2011-11-01 ENCOUNTER — Ambulatory Visit
Admission: RE | Admit: 2011-11-01 | Discharge: 2011-11-01 | Disposition: A | Discharge status: Home / Self Care | Payer: Managed Care, Other (non HMO) | Source: Ambulatory Visit | Attending: Radiation Oncology | Admitting: Radiation Oncology

## 2011-11-01 DIAGNOSIS — C61 Malignant neoplasm of prostate: Secondary | ICD-10-CM | POA: Diagnosis not present

## 2011-11-02 ENCOUNTER — Ambulatory Visit
Admission: RE | Admit: 2011-11-02 | Discharge: 2011-11-02 | Disposition: A | Discharge status: Home / Self Care | Payer: Managed Care, Other (non HMO) | Source: Ambulatory Visit | Attending: Radiation Oncology | Admitting: Radiation Oncology

## 2011-11-02 DIAGNOSIS — C61 Malignant neoplasm of prostate: Secondary | ICD-10-CM | POA: Diagnosis not present

## 2011-11-03 ENCOUNTER — Ambulatory Visit
Admission: RE | Admit: 2011-11-03 | Discharge: 2011-11-03 | Disposition: A | Discharge status: Home / Self Care | Payer: Managed Care, Other (non HMO) | Source: Ambulatory Visit | Attending: Radiation Oncology | Admitting: Radiation Oncology

## 2011-11-03 DIAGNOSIS — C61 Malignant neoplasm of prostate: Secondary | ICD-10-CM | POA: Diagnosis not present

## 2011-11-06 ENCOUNTER — Ambulatory Visit
Admission: RE | Admit: 2011-11-06 | Discharge: 2011-11-06 | Disposition: A | Discharge status: Home / Self Care | Payer: Managed Care, Other (non HMO) | Source: Ambulatory Visit | Attending: Radiation Oncology | Admitting: Radiation Oncology

## 2011-11-06 ENCOUNTER — Encounter: Payer: Self-pay | Admitting: Radiation Oncology

## 2011-11-06 VITALS — BP 134/74 | HR 55 | Resp 18 | Wt 219.8 lb

## 2011-11-06 DIAGNOSIS — C61 Malignant neoplasm of prostate: Secondary | ICD-10-CM

## 2011-11-06 NOTE — Progress Notes (Signed)
Weekly Management Note:  Site:Prostate Current Dose:  6840  cGy Projected Dose: 7600  cGy  Narrative: The patient is seen today for routine under treatment assessment. CBCT/MVCT images/port films were reviewed. The chart was reviewed.   Bladder filling satisfactory. His urinary flow remains improved on Flomax. His dysuria is improved on Pyridium.  Physical Examination:  Filed Vitals:   11/06/11 0951  BP: 134/74  Pulse: 55  Resp: 18  .  Weight: 219 lb 12.8 oz (99.701 kg). No change.  Impression: Tolerating radiation therapy well. He should continue his Flomax but may discontinue his Pyridium or take when necessary.  Plan: Continue radiation therapy as planned. He'll complete his radiation therapy this Friday and then return here for a one-month followup visit. An appointment card was given for his followup visit.

## 2011-11-06 NOTE — Progress Notes (Signed)
Patient presents to the clinic today unaccompanied for under treat visit with [name]. Patient is alert and oriented to person, place, and time. No distress noted. Steady gait noted. Pleasant affect noted. Patient denies pain at this time. Patient continues to take Flomax as directed. Patient reports that on average he gets up twice per night to void. Patient reports very little burning with urination. Patient denies hematuria. Patient reports a decrease in energy level. Reported all findings to [name].

## 2011-11-07 ENCOUNTER — Ambulatory Visit
Admission: RE | Admit: 2011-11-07 | Discharge: 2011-11-07 | Disposition: A | Discharge status: Home / Self Care | Payer: Managed Care, Other (non HMO) | Source: Ambulatory Visit | Attending: Radiation Oncology | Admitting: Radiation Oncology

## 2011-11-07 DIAGNOSIS — C61 Malignant neoplasm of prostate: Secondary | ICD-10-CM | POA: Diagnosis not present

## 2011-11-08 ENCOUNTER — Ambulatory Visit
Admission: RE | Admit: 2011-11-08 | Discharge: 2011-11-08 | Disposition: A | Discharge status: Home / Self Care | Payer: Managed Care, Other (non HMO) | Source: Ambulatory Visit | Attending: Radiation Oncology | Admitting: Radiation Oncology

## 2011-11-08 DIAGNOSIS — C61 Malignant neoplasm of prostate: Secondary | ICD-10-CM | POA: Diagnosis not present

## 2011-11-09 ENCOUNTER — Ambulatory Visit
Admission: RE | Admit: 2011-11-09 | Discharge: 2011-11-09 | Disposition: A | Discharge status: Home / Self Care | Payer: Managed Care, Other (non HMO) | Source: Ambulatory Visit | Attending: Radiation Oncology | Admitting: Radiation Oncology

## 2011-11-09 DIAGNOSIS — C61 Malignant neoplasm of prostate: Secondary | ICD-10-CM | POA: Diagnosis not present

## 2011-11-10 ENCOUNTER — Ambulatory Visit
Admission: RE | Admit: 2011-11-10 | Discharge: 2011-11-10 | Disposition: A | Discharge status: Home / Self Care | Payer: Managed Care, Other (non HMO) | Source: Ambulatory Visit | Attending: Radiation Oncology | Admitting: Radiation Oncology

## 2011-11-10 DIAGNOSIS — C61 Malignant neoplasm of prostate: Secondary | ICD-10-CM | POA: Diagnosis not present

## 2011-11-13 ENCOUNTER — Encounter: Payer: Self-pay | Admitting: Radiation Oncology

## 2011-11-13 NOTE — Progress Notes (Signed)
[facility] Radiation Oncology End of Treatment Note  Name:Brian White  Date: 11/13/2011 ZOX:[id number] DOB:1950-10-11   Status:outpatient    CC: Egbert Garibaldi, NP, NP  Dr. Oda Kilts  REFERRING PHYSICIAN: Dr. Oda Kilts     DIAGNOSIS: Stage TI C. favorable risk adenocarcinoma prostate   INDICATION FOR TREATMENT: Curative   TREATMENT DATES: 09/18/2011 through 11/10/2011                          SITE/DOSE:  Prostate 7600 cGy 40 sessions, seminal vesicles 5600 cGy 40 sessions                         BEAMS/ENERGY:   6 MV photons dual modulated ARC IMRT . Image guidance with daily KV/weekly conebeam CT             NARRATIVE:   The patient tolerated treatment well although he did have mild dysuria for which she was given Pyridium to use when necessary. He was also started Flomax to improve his urinary flow.                         PLAN: Routine followup in one month. Patient instructed to call if questions or worsening complaints in interim.

## 2011-11-13 NOTE — Progress Notes (Signed)
Chart note: The patient began his IMRT on 09/18/2011. At that time he was treated with dual ARC IMRT requiring modulation of MLC leaves representing one set of IMRT treatment devices (16109).

## 2011-12-05 ENCOUNTER — Encounter: Payer: Self-pay | Admitting: Radiation Oncology

## 2011-12-05 ENCOUNTER — Telehealth: Payer: Self-pay | Admitting: *Deleted

## 2011-12-05 NOTE — Telephone Encounter (Signed)
XXXX 

## 2011-12-06 ENCOUNTER — Encounter: Payer: Self-pay | Admitting: Radiation Oncology

## 2011-12-06 ENCOUNTER — Ambulatory Visit
Admission: RE | Admit: 2011-12-06 | Discharge: 2011-12-06 | Disposition: A | Discharge status: Home / Self Care | Payer: Managed Care, Other (non HMO) | Source: Ambulatory Visit | Attending: Radiation Oncology | Admitting: Radiation Oncology

## 2011-12-06 VITALS — BP 155/82 | HR 53 | Resp 20 | Wt 219.5 lb

## 2011-12-06 DIAGNOSIS — C61 Malignant neoplasm of prostate: Secondary | ICD-10-CM

## 2011-12-06 NOTE — Progress Notes (Signed)
Pt denies pain, dysuria, loss of appetite, fatigue is improving. Pt states when he takes Lisinopril- HCTZ he voids freq, has urgency and nocturia x 2-3. He stopped this med 4 days ago and states these symptoms totally resolved. Pt wants to have decrease in dosage of HCTZ.  Advised pt he needs to discuss w/PCP.  Pt on Flomax.

## 2011-12-06 NOTE — Progress Notes (Signed)
Followup note:  [name] returns today approximately 1 month following completion of IMRT in the management of his stage TI C. favorable risk adenocarcinoma prostate. He is doing well from a GU and GI standpoint although he does have nocturia x2-3 where his baseline was x1-2 her prior to his radiation therapy. He does continue with his Flomax. He has not yet have an appointment with Brian White.  Physical examination: Vital signs: Wt Readings from Last 3 Encounters:  12/06/11 219 lb 8 oz (99.565 kg)  11/06/11 219 lb 12.8 oz (99.701 kg)  10/31/11 219 lb 4.8 oz (99.474 kg)   Temp Readings from Last 3 Encounters:  08/02/11 97.2 F (36.2 C) Oral  06/26/11 98 F (36.7 C) Oral   BP Readings from Last 3 Encounters:  12/06/11 155/82  11/06/11 134/74  10/31/11 133/74   Pulse Readings from Last 3 Encounters:  12/06/11 53  11/06/11 55  10/31/11 51    Not performed.  Impression: Satisfactory progress, although he is somewhat bothered by his nocturia x2-3. He does take his diuretic in the morning, and thus I do not think this contributes to his nocturia.  Plan: The patient is to see Brian White for a followup visit in 2 months at which time he will have a PSA determination. He may consider discontinuing his Flomax in a few months. I've not scheduled the patient for a formal followup visit and I ask that Brian White keep me posted on his progress.

## 2012-10-31 ENCOUNTER — Ambulatory Visit (INDEPENDENT_AMBULATORY_CARE_PROVIDER_SITE_OTHER)
Admission: RE | Admit: 2012-10-31 | Discharge: 2012-10-31 | Disposition: A | Discharge status: Home / Self Care | Payer: BC Managed Care – PPO | Source: Ambulatory Visit | Attending: Family Medicine | Admitting: Family Medicine

## 2012-10-31 ENCOUNTER — Encounter: Payer: Self-pay | Admitting: Family Medicine

## 2012-10-31 ENCOUNTER — Ambulatory Visit (INDEPENDENT_AMBULATORY_CARE_PROVIDER_SITE_OTHER): Payer: BC Managed Care – PPO | Admitting: Family Medicine

## 2012-10-31 VITALS — BP 138/82 | HR 64 | Temp 98.0°F | Ht 71.0 in | Wt 215.5 lb

## 2012-10-31 DIAGNOSIS — Z889 Allergy status to unspecified drugs, medicaments and biological substances status: Secondary | ICD-10-CM

## 2012-10-31 DIAGNOSIS — F17200 Nicotine dependence, unspecified, uncomplicated: Secondary | ICD-10-CM | POA: Diagnosis not present

## 2012-10-31 DIAGNOSIS — N529 Male erectile dysfunction, unspecified: Secondary | ICD-10-CM

## 2012-10-31 DIAGNOSIS — R059 Cough, unspecified: Secondary | ICD-10-CM | POA: Diagnosis not present

## 2012-10-31 DIAGNOSIS — I2510 Atherosclerotic heart disease of native coronary artery without angina pectoris: Secondary | ICD-10-CM | POA: Diagnosis not present

## 2012-10-31 DIAGNOSIS — I10 Essential (primary) hypertension: Secondary | ICD-10-CM

## 2012-10-31 DIAGNOSIS — J449 Chronic obstructive pulmonary disease, unspecified: Secondary | ICD-10-CM

## 2012-10-31 DIAGNOSIS — R05 Cough: Secondary | ICD-10-CM

## 2012-10-31 DIAGNOSIS — C61 Malignant neoplasm of prostate: Secondary | ICD-10-CM

## 2012-10-31 DIAGNOSIS — Z9109 Other allergy status, other than to drugs and biological substances: Secondary | ICD-10-CM

## 2012-10-31 MED ORDER — LISINOPRIL-HYDROCHLOROTHIAZIDE 20-25 MG PO TABS: 1.0000 | ORAL_TABLET | Freq: Every day | ORAL | Status: DC

## 2012-10-31 MED ORDER — SILDENAFIL CITRATE 100 MG PO TABS: 50.0000 mg | ORAL_TABLET | Freq: Every day | ORAL | Status: DC | PRN

## 2012-10-31 NOTE — Patient Instructions (Addendum)
Call to schedule appointment with Dr. Patsi Sears as you're due. Good to see you today, call us with questions. No changes to blood pressure medicines. Return at your convenience fasting for blood work and afterwards for physical. Return in next few months for spirometry. Start claritin or zyrtec daily. Xray today. Try viagra.

## 2012-10-31 NOTE — Progress Notes (Signed)
Subjective:    Patient ID: Brian White, male    DOB: 03-May-1951, 62 y.o.   MRN: [id number]  HPI CC: new pt to establish  Prior saw Dr. at [facility].  Prostate cancer - sees Dr. Patsi Sears.  S/p radiation.  Last saw about 1 year ago.  Plan was to return to urology but has not returned.  HTN - on lisinopril hctz and compliant with med.  Notes cough present since on this medicine. Intermittent, sometimes dry and sometimes productive of mucous.  Smoker - 1.5 ppd.  45 years.  Contemplative.  Has tried chantix in past.  ED - has used viagra in past, requests refill.  Reaction to onions ?anaphylaxis - avoids.  Medications and allergies reviewed and updated in chart.  Past histories reviewed and updated if relevant as below. Patient Active Problem List  Diagnosis  . HTN (hypertension)  . COPD (chronic obstructive pulmonary disease)  . Prostate cancer  . Smoker  . Hypertension  . CAD (coronary artery disease)   Past Medical History  Diagnosis Date  . Hypertension   . Arthritis   . CAD (coronary artery disease)     mild MI//pt.[place] showed on stress test  . Prostate cancer 2013    s/p radiation therapy March 2013  . H/O seasonal allergies   . H/O anaphylactic shock 2013    hives,hypotension,syncope  . Smoker    Past Surgical History  Procedure Laterality Date  . Hernia repair Right 2005    with mesh  . Prostate biopsy     History  Substance Use Topics  . Smoking status: Current Every Day Smoker -- 1.50 packs/day for 40 years    Types: Cigarettes  . Smokeless tobacco: Never Used  . Alcohol Use: 0.6 oz/week    1 Shots of liquor per week     Comment: 3 x week   Family History  Problem Relation Age of Onset  . Cancer Father     prostate  . Cancer Sister     breast  . COPD Father   . Hypertension Mother   . CAD Father     possibly  . Diabetes Neg Hx   . Stroke Neg Hx    Allergies  Allergen Reactions  . Onion Hives   Current Outpatient Prescriptions on  File Prior to Visit  Medication Sig Dispense Refill  . aspirin 81 MG tablet Take 81 mg by mouth daily.        . diphenhydrAMINE (BENADRYL) 25 MG tablet Take 25 mg by mouth every 6 (six) hours as needed.      Marland Kitchen EPINEPHrine (EPIPEN) 0.3 mg/0.3 mL DEVI Inject 0.3 mLs (0.3 mg total) into the muscle once.  1 Device  prn  . lisinopril-hydrochlorothiazide (PRINZIDE,ZESTORETIC) 20-25 MG per tablet Take 1 tablet by mouth daily.      . naproxen sodium (ANAPROX) 220 MG tablet Take 440 mg by mouth 2 (two) times daily with a meal. For pain       . loratadine (CLARITIN) 10 MG tablet Take 1 tablet (10 mg total) by mouth daily.       No current facility-administered medications on file prior to visit.     Review of Systems  Constitutional: Negative for fever, chills, activity change, appetite change, fatigue and unexpected weight change.  HENT: Negative for hearing loss and neck pain.   Eyes: Negative for visual disturbance.  Respiratory: Positive for cough. Negative for chest tightness, shortness of breath and wheezing.   Cardiovascular: Negative for  chest pain, palpitations and leg swelling.  Gastrointestinal: Negative for nausea, vomiting, abdominal pain, diarrhea, constipation, blood in stool and abdominal distention.  Genitourinary: Negative for hematuria and difficulty urinating.  Musculoskeletal: Negative for myalgias and arthralgias.  Skin: Negative for rash.  Neurological: Negative for dizziness, seizures, syncope and headaches.  Hematological: Does not bruise/bleed easily.  Psychiatric/Behavioral: Negative for dysphoric mood. The patient is not nervous/anxious.        Objective:   Physical Exam  Nursing note and vitals reviewed. Constitutional: He is oriented to person, place, and time. He appears well-developed and well-nourished. No distress.  HENT:  Head: Normocephalic and atraumatic.  Right Ear: Hearing, tympanic membrane, external ear and ear canal normal.  Left Ear: Hearing,  tympanic membrane, external ear and ear canal normal.  Nose: Nose normal.  Mouth/Throat: Oropharynx is clear and moist. No oropharyngeal exudate.  Eyes: Conjunctivae and EOM are normal. Pupils are equal, round, and reactive to light. No scleral icterus.  Neck: Normal range of motion. Neck supple. Carotid bruit is not present. No thyromegaly present.  Cardiovascular: Normal rate, regular rhythm, normal heart sounds and intact distal pulses.   No murmur heard. Pulses:      Radial pulses are 2+ on the right side, and 2+ on the left side.  Pulmonary/Chest: Effort normal. No respiratory distress. He has no wheezes. He has rales (LLL).  Coarse breath sounds throughout  Musculoskeletal: Normal range of motion. He exhibits no edema.  Lymphadenopathy:    He has no cervical adenopathy.  Neurological: He is alert and oriented to person, place, and time.  CN grossly intact, station and gait intact  Skin: Skin is warm and dry. No rash noted.  Psychiatric: He has a normal mood and affect. His behavior is normal. Judgment and thought content normal.       Assessment & Plan:

## 2012-11-01 ENCOUNTER — Encounter: Payer: Self-pay | Admitting: Family Medicine

## 2012-11-01 DIAGNOSIS — J441 Chronic obstructive pulmonary disease with (acute) exacerbation: Secondary | ICD-10-CM | POA: Insufficient documentation

## 2012-11-01 DIAGNOSIS — Z889 Allergy status to unspecified drugs, medicaments and biological substances status: Secondary | ICD-10-CM | POA: Insufficient documentation

## 2012-11-01 NOTE — Assessment & Plan Note (Signed)
trial of viagra.  Aware to stop if any cardiac sxs of chest pain, dyspnea

## 2012-11-01 NOTE — Assessment & Plan Note (Signed)
I encouraged him to reschedule with Dr. Cassell Smiles as he's due for recheck.   Released from [name].

## 2012-11-01 NOTE — Assessment & Plan Note (Addendum)
Will need to return for spirometry. Discussed COPD and importance of smoking cessation.

## 2012-11-01 NOTE — Assessment & Plan Note (Signed)
Recommended antihistamine.

## 2012-11-01 NOTE — Assessment & Plan Note (Signed)
Persistent cough in chronic smoker - check chest xray to eval for persistent bronchitis vs other lesion - clear on my read, + COPD.

## 2012-11-01 NOTE — Assessment & Plan Note (Signed)
Asxs. Encouraged cessation.

## 2012-11-01 NOTE — Assessment & Plan Note (Signed)
Chronic, stable. Doubt current cough due to ACEI.  Continue meds.

## 2012-11-01 NOTE — Assessment & Plan Note (Signed)
Encouraged cessation, discussed different methods.  contemplative.

## 2012-11-06 ENCOUNTER — Other Ambulatory Visit: Payer: Self-pay | Admitting: Family Medicine

## 2012-11-15 ENCOUNTER — Other Ambulatory Visit (INDEPENDENT_AMBULATORY_CARE_PROVIDER_SITE_OTHER): Payer: BC Managed Care – PPO

## 2012-11-15 DIAGNOSIS — R059 Cough, unspecified: Secondary | ICD-10-CM

## 2012-11-15 DIAGNOSIS — I10 Essential (primary) hypertension: Secondary | ICD-10-CM

## 2012-11-15 DIAGNOSIS — R05 Cough: Secondary | ICD-10-CM

## 2012-11-15 LAB — CBC WITH DIFFERENTIAL/PLATELET
Basophils Absolute: 0 10*3/uL (ref 0.0–0.1)
Basophils Relative: 0.4 % (ref 0.0–3.0)
Eosinophils Absolute: 0.1 10*3/uL (ref 0.0–0.7)
Eosinophils Relative: 1.3 % (ref 0.0–5.0)
HCT: 52.1 % — ABNORMAL HIGH (ref 39.0–52.0)
Hemoglobin: 17.8 g/dL — ABNORMAL HIGH (ref 13.0–17.0)
Lymphocytes Relative: 33.6 % (ref 12.0–46.0)
Lymphs Abs: 2.6 10*3/uL (ref 0.7–4.0)
MCHC: 34.2 g/dL (ref 30.0–36.0)
MCV: 99.3 fl (ref 78.0–100.0)
Monocytes Absolute: 0.8 10*3/uL (ref 0.1–1.0)
Monocytes Relative: 9.9 % (ref 3.0–12.0)
Neutro Abs: 4.3 10*3/uL (ref 1.4–7.7)
Neutrophils Relative %: 54.8 % (ref 43.0–77.0)
Platelets: 166 10*3/uL (ref 150.0–400.0)
RBC: 5.25 Mil/uL (ref 4.22–5.81)
RDW: 14.1 % (ref 11.5–14.6)
WBC: 7.8 10*3/uL (ref 4.5–10.5)

## 2012-11-15 LAB — TSH: TSH: 0.37 u[IU]/mL (ref 0.35–5.50)

## 2012-11-18 LAB — COMPREHENSIVE METABOLIC PANEL
ALT: 16 U/L (ref 0–53)
AST: 27 U/L (ref 0–37)
Albumin: 4.7 g/dL (ref 3.5–5.2)
Alkaline Phosphatase: 63 U/L (ref 39–117)
BUN: 17 mg/dL (ref 6–23)
CO2: 29 mEq/L (ref 19–32)
Calcium: 9.6 mg/dL (ref 8.4–10.5)
Chloride: 101 mEq/L (ref 96–112)
Creatinine, Ser: 1.1 mg/dL (ref 0.4–1.5)
GFR: 73.14 mL/min (ref >60.00)
Glucose, Bld: 106 mg/dL — ABNORMAL HIGH (ref 70–99)
Potassium: 4.3 mEq/L (ref 3.5–5.1)
Sodium: 138 mEq/L (ref 135–145)
Total Bilirubin: 1.1 mg/dL (ref 0.3–1.2)
Total Protein: 7.6 g/dL (ref 6.0–8.3)

## 2012-11-18 LAB — LIPID PANEL
Cholesterol: 217 mg/dL — ABNORMAL HIGH (ref 0–200)
HDL: 53.7 mg/dL (ref >39.00)
Total CHOL/HDL Ratio: 4
Triglycerides: 133 mg/dL (ref 0.0–149.0)
VLDL: 26.6 mg/dL (ref 0.0–40.0)

## 2012-11-18 LAB — LDL CHOLESTEROL, DIRECT: Direct LDL: 149.6 mg/dL

## 2012-11-22 ENCOUNTER — Ambulatory Visit (INDEPENDENT_AMBULATORY_CARE_PROVIDER_SITE_OTHER): Payer: BC Managed Care – PPO | Admitting: Family Medicine

## 2012-11-22 ENCOUNTER — Encounter: Payer: Self-pay | Admitting: Family Medicine

## 2012-11-22 VITALS — BP 130/74 | HR 64 | Temp 98.5°F | Ht 71.0 in | Wt 212.5 lb

## 2012-11-22 DIAGNOSIS — I10 Essential (primary) hypertension: Secondary | ICD-10-CM | POA: Diagnosis not present

## 2012-11-22 DIAGNOSIS — Z23 Encounter for immunization: Secondary | ICD-10-CM

## 2012-11-22 DIAGNOSIS — Z1211 Encounter for screening for malignant neoplasm of colon: Secondary | ICD-10-CM | POA: Diagnosis not present

## 2012-11-22 DIAGNOSIS — J449 Chronic obstructive pulmonary disease, unspecified: Secondary | ICD-10-CM

## 2012-11-22 DIAGNOSIS — F17200 Nicotine dependence, unspecified, uncomplicated: Secondary | ICD-10-CM

## 2012-11-22 DIAGNOSIS — C61 Malignant neoplasm of prostate: Secondary | ICD-10-CM

## 2012-11-22 DIAGNOSIS — Z0000 Encounter for general adult medical examination without abnormal findings: Secondary | ICD-10-CM | POA: Diagnosis not present

## 2012-11-22 DIAGNOSIS — J4489 Other specified chronic obstructive pulmonary disease: Secondary | ICD-10-CM

## 2012-11-22 NOTE — Assessment & Plan Note (Signed)
I have personally reviewed the Medicare Annual Wellness questionnaire and have noted 1. The patient's medical and social history 2. Their use of alcohol, tobacco or illicit drugs 3. Their current medications and supplements 4. The patient's functional ability including ADL's, fall risks, home safety risks and hearing or visual impairment. 5. Diet and physical activity 6. Evidence for depression or mood disorders The patients weight, height, BMI have been recorded in the chart.  Hearing and vision has been addressed. I have made referrals, counseling and provided education to the patient based review of the above and I have provided the pt with a written personalized care plan for preventive services. See scanned questionairre. Advanced directives discussed: pt states wife is working on this.  Reviewed preventative protocols and updated unless pt declined. Pneumovax today. iFOB today. Recommended schedule f/u with urology as due. Discussed lung cancer screening with with LRCT scan - pt will call insurance to check on this and let me know.

## 2012-11-22 NOTE — Addendum Note (Signed)
Addended by: Josph Macho A on: 11/22/2012 09:43 AM   Modules accepted: Orders

## 2012-11-22 NOTE — Assessment & Plan Note (Signed)
Chronic, stable. Continue med. 

## 2012-11-22 NOTE — Progress Notes (Signed)
Subjective:    Patient ID: Brian White, male    DOB: 01/17/1951, 62 y.o.   MRN: [id number]  HPI CC: physical  Has had medicare for last 3-4 years - for disability from herniated discs in back.  Smoker - 1-1.5 ppd.  60+ PY hx. Contemplative.  Likely h/o COPD with mild hyperinflation on recent xray - endorses stable breathing. Wt Readings from Last 3 Encounters:  11/22/12 212 lb 8 oz (96.389 kg)  10/31/12 215 lb 8 oz (97.75 kg)  12/06/11 219 lb 8 oz (99.565 kg)  denies weight loss - states fluctuates between 210 and 220 lbs.  Lives with wife, dogs, chickens and goats Occupation: retired.  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  Saw eye doctor 1 yr ago.  Advised to reschedule Hearing screen normal. 1 fall in last year, no injury. Denies depression/anhedonia.  Preventative: Prostate cancer - sees Dr. Patsi Sears. S/p radiation. Last saw about 1 year ago. Plan was to return to urology but has not returned. Colon cancer screening - discussed - would like stool kit. Tetanus - thinks within 5 yrs Flu shot - does not receive Pneumovax - has not received.  Will receive today. Shingles - did not discuss. Advanced directives: working with friend who is Clinical research associate.  Medications and allergies reviewed and updated in chart.  Past histories reviewed and updated if relevant as below. Patient Active Problem List   Diagnosis Date Noted  . Cough 11/01/2012  . Hypertension 11/01/2012  . H/O seasonal allergies   . Smoker   . CAD (coronary artery disease)   . ED (erectile dysfunction)   . Prostate cancer 08/02/2011  . HTN (hypertension) 06/26/2011  . COPD (chronic obstructive pulmonary disease) 06/26/2011   Past Medical History  Diagnosis Date  . Hypertension   . Arthritis   . CAD (coronary artery disease)     mild MI//pt.[place] showed  on stress test  . Prostate cancer 2013    s/p radiation therapy March 2013  . H/O seasonal allergies   . H/O anaphylactic shock 2013    hives,hypotension,syncope  . Smoker   . ED (erectile dysfunction)    Past Surgical History  Procedure Laterality Date  . Hernia repair Right 2005    with mesh  . Prostate biopsy     History  Substance Use Topics  . Smoking status: Current Every Day Smoker -- 1.50 packs/day for 40 years    Types: Cigarettes  . Smokeless tobacco: Never Used  . Alcohol Use: 0.6 oz/week    1 Shots of liquor per week     Comment: 3 x week   Family History  Problem Relation Age of Onset  . Cancer Father     prostate  . Cancer Sister     breast  . COPD Father   . Hypertension Mother   . CAD Father     possibly  . Diabetes Neg Hx   . Stroke Neg Hx    Allergies  Allergen Reactions  . Onion Hives   Current Outpatient Prescriptions on File Prior to Visit  Medication Sig Dispense Refill  . aspirin 81 MG tablet Take 81 mg by mouth daily.        . diphenhydrAMINE (BENADRYL) 25 MG tablet Take 25 mg by mouth every 6 (six) hours as needed.      Marland Kitchen EPINEPHrine (EPIPEN) 0.3  mg/0.3 mL DEVI Inject 0.3 mLs (0.3 mg total) into the muscle once.  1 Device  prn  . lisinopril-hydrochlorothiazide (PRINZIDE,ZESTORETIC) 20-25 MG per tablet Take 1 tablet by mouth daily.  30 tablet  11  . naproxen sodium (ANAPROX) 220 MG tablet Take 440 mg by mouth 2 (two) times daily with a meal. For pain       . sildenafil (VIAGRA) 100 MG tablet Take 0.5-1 tablets (50-100 mg total) by mouth daily as needed for erectile dysfunction.  4 tablet  0  . guaiFENesin (MUCINEX) 600 MG 12 hr tablet Take 600 mg by mouth 2 (two) times daily.       No current facility-administered medications on file prior to visit.     Review of Systems  Constitutional: Negative for fever, chills, activity change, appetite change, fatigue and unexpected weight change.  HENT: Negative for hearing loss and neck pain.    Eyes: Negative for visual disturbance.  Respiratory: Positive for cough and wheezing. Negative for chest tightness and shortness of breath.   Cardiovascular: Negative for chest pain, palpitations and leg swelling.  Gastrointestinal: Negative for nausea, vomiting, abdominal pain, diarrhea, constipation, blood in stool and abdominal distention.  Genitourinary: Negative for hematuria and difficulty urinating.  Musculoskeletal: Negative for myalgias and arthralgias.  Skin: Negative for rash.  Neurological: Negative for dizziness, seizures, syncope and headaches.  Hematological: Does not bruise/bleed easily.  Psychiatric/Behavioral: Negative for dysphoric mood. The patient is not nervous/anxious.        Objective:   Physical Exam  Nursing note and vitals reviewed. Constitutional: He is oriented to person, place, and time. He appears well-developed and well-nourished. No distress.  HENT:  Head: Normocephalic and atraumatic.  Nose: Nose normal.  Mouth/Throat: Oropharynx is clear and moist. No oropharyngeal exudate.  Eyes: Conjunctivae and EOM are normal. Pupils are equal, round, and reactive to light. No scleral icterus.  Neck: Normal range of motion. Neck supple. Carotid bruit is not present. No thyromegaly present.  Cardiovascular: Normal rate, regular rhythm, normal heart sounds and intact distal pulses.   No murmur heard. Pulses:      Radial pulses are 2+ on the right side, and 2+ on the left side.  Pulmonary/Chest: Effort normal and breath sounds normal. No respiratory distress. He has no wheezes. He has no rales.  Slight ronchorous RLL  Abdominal: Soft. Bowel sounds are normal. He exhibits no distension and no mass. There is no tenderness. There is no rebound and no guarding.  Genitourinary:  will defer to uro  Musculoskeletal: Normal range of motion. He exhibits no edema.  Lymphadenopathy:    He has no cervical adenopathy.  Neurological: He is alert and oriented to person, place,  and time.  CN grossly intact, station and gait intact  Skin: Skin is warm and dry. No rash noted.  Psychiatric: He has a normal mood and affect. His behavior is normal. Judgment and thought content normal.       Assessment & Plan:

## 2012-11-22 NOTE — Assessment & Plan Note (Signed)
Pt will schedule f/u appt with Dr. Patsi Sears.

## 2012-11-22 NOTE — Assessment & Plan Note (Signed)
Encouraged cessation. Contemplative. 

## 2012-11-22 NOTE — Patient Instructions (Addendum)
Stool kit today Pneumonia shot today. Return at your convenience for spirometry to test lung function Recruit wife to quit smoking with you - best thing you can do for your health. Check with insurance about screening CAT scan of lungs for lung cancer in history of smoking - if it's covered and how much it would cost. Call your insurance about the shingles shot to see if it is covered or how much it would cost and where is cheaper (here or pharmacy).  If you want to receive here, call for nurse visit.

## 2012-11-22 NOTE — Assessment & Plan Note (Signed)
Pt will return for spirometry. Continue to encourage smoking cessation.

## 2012-12-03 ENCOUNTER — Encounter: Payer: Self-pay | Admitting: Family Medicine

## 2012-12-15 ENCOUNTER — Encounter: Payer: Self-pay | Admitting: Family Medicine

## 2013-11-11 ENCOUNTER — Other Ambulatory Visit: Payer: Self-pay | Admitting: Family Medicine

## 2014-01-06 ENCOUNTER — Other Ambulatory Visit: Payer: Self-pay

## 2014-01-06 MED ORDER — LISINOPRIL-HYDROCHLOROTHIAZIDE 20-25 MG PO TABS: ORAL_TABLET | ORAL | Status: DC

## 2014-01-06 NOTE — Telephone Encounter (Signed)
plz notify wife this was refilled but pt needs to schedule f/u or physical appt prior to more refills. Needs to be seen once a year.

## 2014-01-06 NOTE — Telephone Encounter (Signed)
Brian White left v/m requesting refill lisinopril HCTZ to [facility]; going out of town and pt is out of med. Pt last seen05/09/14 and last refilled 11/11/13 # 30 with note must have physical for further refills.Please advise.

## 2014-01-07 NOTE — Telephone Encounter (Addendum)
Message left notifying patient and patient's wife.

## 2014-01-25 ENCOUNTER — Other Ambulatory Visit: Payer: Self-pay | Admitting: Family Medicine

## 2014-01-25 ENCOUNTER — Encounter: Payer: Self-pay | Admitting: Family Medicine

## 2014-01-25 DIAGNOSIS — E785 Hyperlipidemia, unspecified: Secondary | ICD-10-CM

## 2014-01-25 DIAGNOSIS — I10 Essential (primary) hypertension: Secondary | ICD-10-CM

## 2014-01-25 DIAGNOSIS — D751 Secondary polycythemia: Secondary | ICD-10-CM | POA: Insufficient documentation

## 2014-01-25 DIAGNOSIS — C61 Malignant neoplasm of prostate: Secondary | ICD-10-CM

## 2014-01-25 HISTORY — DX: Secondary polycythemia: D75.1

## 2014-01-26 ENCOUNTER — Other Ambulatory Visit (INDEPENDENT_AMBULATORY_CARE_PROVIDER_SITE_OTHER): Payer: BC Managed Care – PPO

## 2014-01-26 DIAGNOSIS — E785 Hyperlipidemia, unspecified: Secondary | ICD-10-CM | POA: Diagnosis not present

## 2014-01-26 DIAGNOSIS — D45 Polycythemia vera: Secondary | ICD-10-CM

## 2014-01-26 DIAGNOSIS — I10 Essential (primary) hypertension: Secondary | ICD-10-CM | POA: Diagnosis not present

## 2014-01-26 DIAGNOSIS — D751 Secondary polycythemia: Secondary | ICD-10-CM

## 2014-01-26 DIAGNOSIS — C61 Malignant neoplasm of prostate: Secondary | ICD-10-CM

## 2014-01-26 LAB — PSA: PSA: 2.17 ng/mL (ref 0.10–4.00)

## 2014-01-26 LAB — LIPID PANEL
Cholesterol: 185 mg/dL (ref 0–200)
HDL: 55 mg/dL (ref >39.00)
LDL Cholesterol: 110 mg/dL — ABNORMAL HIGH (ref 0–99)
NonHDL: 130
Total CHOL/HDL Ratio: 3
Triglycerides: 100 mg/dL (ref 0.0–149.0)
VLDL: 20 mg/dL (ref 0.0–40.0)

## 2014-01-26 LAB — CBC WITH DIFFERENTIAL/PLATELET
Basophils Absolute: 0 10*3/uL (ref 0.0–0.1)
Basophils Relative: 0.6 % (ref 0.0–3.0)
Eosinophils Absolute: 0.1 10*3/uL (ref 0.0–0.7)
Eosinophils Relative: 1.8 % (ref 0.0–5.0)
HCT: 50.1 % (ref 39.0–52.0)
Hemoglobin: 16.5 g/dL (ref 13.0–17.0)
Lymphocytes Relative: 39.7 % (ref 12.0–46.0)
Lymphs Abs: 2.2 10*3/uL (ref 0.7–4.0)
MCHC: 33 g/dL (ref 30.0–36.0)
MCV: 100 fl (ref 78.0–100.0)
Monocytes Absolute: 0.6 10*3/uL (ref 0.1–1.0)
Monocytes Relative: 10.9 % (ref 3.0–12.0)
Neutro Abs: 2.6 10*3/uL (ref 1.4–7.7)
Neutrophils Relative %: 47 % (ref 43.0–77.0)
Platelets: 152 10*3/uL (ref 150.0–400.0)
RBC: 5.01 Mil/uL (ref 4.22–5.81)
RDW: 15 % (ref 11.5–15.5)
WBC: 5.5 10*3/uL (ref 4.0–10.5)

## 2014-01-26 LAB — BASIC METABOLIC PANEL
BUN: 15 mg/dL (ref 6–23)
CO2: 31 mEq/L (ref 19–32)
Calcium: 9.7 mg/dL (ref 8.4–10.5)
Chloride: 108 mEq/L (ref 96–112)
Creatinine, Ser: 1 mg/dL (ref 0.4–1.5)
GFR: 81.41 mL/min (ref >60.00)
Glucose, Bld: 98 mg/dL (ref 70–99)
Potassium: 5.3 mEq/L — ABNORMAL HIGH (ref 3.5–5.1)
Sodium: 143 mEq/L (ref 135–145)

## 2014-01-29 ENCOUNTER — Encounter: Payer: Self-pay | Admitting: Family Medicine

## 2014-01-29 ENCOUNTER — Ambulatory Visit (INDEPENDENT_AMBULATORY_CARE_PROVIDER_SITE_OTHER): Payer: BC Managed Care – PPO | Admitting: Family Medicine

## 2014-01-29 VITALS — BP 126/84 | HR 60 | Temp 98.1°F | Ht 71.0 in | Wt 215.2 lb

## 2014-01-29 DIAGNOSIS — C61 Malignant neoplasm of prostate: Secondary | ICD-10-CM

## 2014-01-29 DIAGNOSIS — Z1211 Encounter for screening for malignant neoplasm of colon: Secondary | ICD-10-CM

## 2014-01-29 DIAGNOSIS — I10 Essential (primary) hypertension: Secondary | ICD-10-CM

## 2014-01-29 DIAGNOSIS — J449 Chronic obstructive pulmonary disease, unspecified: Secondary | ICD-10-CM | POA: Diagnosis not present

## 2014-01-29 DIAGNOSIS — F17200 Nicotine dependence, unspecified, uncomplicated: Secondary | ICD-10-CM

## 2014-01-29 DIAGNOSIS — E785 Hyperlipidemia, unspecified: Secondary | ICD-10-CM

## 2014-01-29 DIAGNOSIS — Z0000 Encounter for general adult medical examination without abnormal findings: Secondary | ICD-10-CM | POA: Diagnosis not present

## 2014-01-29 MED ORDER — TIOTROPIUM BROMIDE MONOHYDRATE 18 MCG IN CAPS: 18.0000 ug | ORAL_CAPSULE | Freq: Every day | RESPIRATORY_TRACT | Status: DC

## 2014-01-29 MED ORDER — LISINOPRIL-HYDROCHLOROTHIAZIDE 20-25 MG PO TABS: ORAL_TABLET | ORAL | Status: DC

## 2014-01-29 NOTE — Assessment & Plan Note (Signed)
Preventative protocols reviewed and updated unless pt declined. Discussed healthy diet and lifestyle.  

## 2014-01-29 NOTE — Progress Notes (Signed)
Pre visit review using our clinic review tool, if applicable. No additional management support is needed unless otherwise documented below in the visit note. 

## 2014-01-29 NOTE — Assessment & Plan Note (Signed)
Chronic, stable. K elevated on check. Encouraged increased water intake.

## 2014-01-29 NOTE — Patient Instructions (Addendum)
Stool kit today. Call your insurance about the shingles shot to see if it is covered or how much it would cost and where is cheaper (here or pharmacy).  If you want to receive here, call for nurse visit. Ask insurance about screening for lung cancer with CT scan. Start spiriva one inhalation daily for breathing - I do think you have COPD from smoking - recruit wife to quit with you! Return in 3 months for spirometry for breathing test. Use antibiotic ointment for spot on abdomen. Good to see you today, call us with questions.

## 2014-01-29 NOTE — Assessment & Plan Note (Signed)
Continue to encourage cessation. Discussed recruiting wife.

## 2014-01-29 NOTE — Assessment & Plan Note (Signed)
Stable off meds. Improved #s with healthy diet changes - continue.

## 2014-01-29 NOTE — Progress Notes (Signed)
BP 126/84  Pulse 60  Temp(Src) 98.1 F (36.7 C) (Oral)  Ht _0  (1.803 m)  Wt 215 lb 4 oz (97.637 kg)  BMI 30.03 kg/m2   CC: medicare wellness visit  Subjective:    Patient ID: Brian White, male    DOB: [birth year], 63 y.o.   MRN: [id number]  HPI: Brian White is a 63 y.o. male presenting on 01/29/2014 for Annual Exam   Has had medicare for 4 years Smoking 1.5 ppd. Presumed COPD - chronic cough, denies dyspnea or wheeze. H/o recurrent bronchitis but not recently. Recent cyst/infection L lower abdominal wall, improving on its own. Pt drained it and treating with medicated gold salve. rec continued warm compresses  Failed vision screen on right side. Saw eye doctor a few years ago.  Hearing screen normal.   Preventative: H/o prostate cancer - saw Dr. Gaynelle Arabian. S/p radiation only, denies seeds. Last saw 2013. Plan was to return to urology but has not returned - and currently declines this as well. Requests we continue checking PSA here. Colon cancer screening - discussed - would like stool kit. Denies blood in stool or bowel changes. Pt state she never received last year's ifob Tetanus - thinks within 5 yrs  Flu shot - does not receive  Pneumovax - 11/2012 Shingles - did not discuss.   Lives with wife, dogs, chickens and goats  Occupation: retired. 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  Allergies and medications reviewed and updated. Current Outpatient Prescriptions on File Prior to Visit  Medication Sig  . aspirin 81 MG tablet Take 81 mg by mouth daily.    . cetirizine (ZYRTEC) 10 MG tablet Take 10 mg by mouth daily.  . diphenhydrAMINE (BENADRYL) 25 MG tablet Take 25 mg by mouth every 6 (six) hours as needed.  Marland Kitchen EPINEPHrine (EPIPEN) 0.3 mg/0.3 mL DEVI Inject  0.3 mLs (0.3 mg total) into the muscle once.  . naproxen sodium (ANAPROX) 220 MG tablet Take 440 mg by mouth 2 (two) times daily with a meal. For pain   . sildenafil (VIAGRA) 100 MG tablet Take 0.5-1 tablets (50-100 mg total) by mouth daily as needed for erectile dysfunction.   No current facility-administered medications on file prior to visit.    Review of Systems  Constitutional: Negative for fever, chills, activity change, appetite change, fatigue and unexpected weight change.  HENT: Negative for hearing loss.   Eyes: Negative for visual disturbance.  Respiratory: Positive for cough (smoker's). Negative for chest tightness, shortness of breath and wheezing.   Cardiovascular: Negative for chest pain, palpitations and leg swelling.  Gastrointestinal: Negative for nausea, vomiting, abdominal pain, diarrhea, constipation, blood in stool and abdominal distention.  Genitourinary: Negative for hematuria and difficulty urinating.  Musculoskeletal: Negative for arthralgias, myalgias and neck pain.  Skin: Negative for rash.  Neurological: Negative for dizziness, seizures, syncope and headaches.  Hematological: Negative for adenopathy. Does not bruise/bleed easily.  Psychiatric/Behavioral: Negative for dysphoric mood. The patient is not nervous/anxious.    Per HPI unless specifically indicated above    Objective:    BP 126/84  Pulse 60  Temp(Src) 98.1 F (36.7 C) (Oral)  Ht _1  (1.803 m)  Wt 215 lb 4 oz (97.637 kg)  BMI 30.03 kg/m2  Physical Exam  Nursing note and vitals  reviewed. Constitutional: He is oriented to person, place, and time. He appears well-developed and well-nourished. No distress.  HENT:  Head: Normocephalic and atraumatic.  Right Ear: Hearing, tympanic membrane, external ear and ear canal normal.  Left Ear: Hearing, tympanic membrane, external ear and ear canal normal.  Nose: Nose normal.  Mouth/Throat: Uvula is midline, oropharynx is clear and moist and mucous  membranes are normal. No oropharyngeal exudate, posterior oropharyngeal edema or posterior oropharyngeal erythema.  Eyes: Conjunctivae and EOM are normal. Pupils are equal, round, and reactive to light. No scleral icterus.  Neck: Normal range of motion. Neck supple. No thyromegaly present.  Cardiovascular: Normal rate, regular rhythm, normal heart sounds and intact distal pulses.   No murmur heard. Pulses:      Radial pulses are 2+ on the right side, and 2+ on the left side.  Pulmonary/Chest: Effort normal. No respiratory distress. He has no decreased breath sounds. He has no wheezes. He has rhonchi in the right lower field. He has no rales.  Abdominal: Soft. Bowel sounds are normal. He exhibits no distension and no mass. There is no tenderness. There is no rebound and no guarding.  Genitourinary: Rectum normal and prostate normal. Rectal exam shows no external hemorrhoid, no internal hemorrhoid, no fissure, no mass, no tenderness and anal tone normal. Prostate is not enlarged (20gm) and not tender.  Musculoskeletal: Normal range of motion. He exhibits no edema.  Lymphadenopathy:    He has no cervical adenopathy.  Neurological: He is alert and oriented to person, place, and time.  CN grossly intact, station and gait intact  Skin: Skin is warm and dry. No rash noted.  Psychiatric: He has a normal mood and affect. His behavior is normal. Judgment and thought content normal.   Results for orders placed in visit on 01/26/14  LIPID PANEL      Result Value Ref Range   Cholesterol 185  0 - 200 mg/dL   Triglycerides 100.0  0.0 - 149.0 mg/dL   HDL 55.00  >39.00 mg/dL   VLDL 20.0  0.0 - 40.0 mg/dL   LDL Cholesterol 110 (*) 0 - 99 mg/dL   Total CHOL/HDL Ratio 3     NonHDL 130.00    CBC WITH DIFFERENTIAL      Result Value Ref Range   WBC 5.5  4.0 - 10.5 K/uL   RBC 5.01  4.22 - 5.81 Mil/uL   Hemoglobin 16.5  13.0 - 17.0 g/dL   HCT 50.1  39.0 - 52.0 %   MCV 100.0  78.0 - 100.0 fl   MCHC 33.0   30.0 - 36.0 g/dL   RDW 15.0  11.5 - 15.5 %   Platelets 152.0  150.0 - 400.0 K/uL   Neutrophils Relative % 47.0  43.0 - 77.0 %   Lymphocytes Relative 39.7  12.0 - 46.0 %   Monocytes Relative 10.9  3.0 - 12.0 %   Eosinophils Relative 1.8  0.0 - 5.0 %   Basophils Relative 0.6  0.0 - 3.0 %   Neutro Abs 2.6  1.4 - 7.7 K/uL   Lymphs Abs 2.2  0.7 - 4.0 K/uL   Monocytes Absolute 0.6  0.1 - 1.0 K/uL   Eosinophils Absolute 0.1  0.0 - 0.7 K/uL   Basophils Absolute 0.0  0.0 - 0.1 K/uL  BASIC METABOLIC PANEL      Result Value Ref Range   Sodium 143  135 - 145 mEq/L   Potassium 5.3 (*) 3.5 - 5.1 mEq/L  Chloride 108  96 - 112 mEq/L   CO2 31  19 - 32 mEq/L   Glucose, Bld 98  70 - 99 mg/dL   BUN 15  6 - 23 mg/dL   Creatinine, Ser 1.0  0.4 - 1.5 mg/dL   Calcium 9.7  8.4 - 10.5 mg/dL   GFR 81.41  >60.00 mL/min  PSA      Result Value Ref Range   PSA 2.17  0.10 - 4.00 ng/mL      Assessment & Plan:   Problem List Items Addressed This Visit   Smoker     Continue to encourage cessation. Discussed recruiting wife.    Prostate cancer     DRE/PSA reassuring today. Continue yearly check. S/p radiation treatment, pt declines return to urology.    HTN (hypertension) (Chronic)     Chronic, stable. K elevated on check. Encouraged increased water intake.    Relevant Medications      lisinopril-hydrochlorothiazide (PRINZIDE,ZESTORETIC) 20-25 MG per tablet   HLD (hyperlipidemia)     Stable off meds. Improved #s with healthy diet changes - continue.    Relevant Medications      lisinopril-hydrochlorothiazide (PRINZIDE,ZESTORETIC) 20-25 MG per tablet   Health care maintenance - Primary     Preventative protocols reviewed and updated unless pt declined. Discussed healthy diet and lifestyle.    COPD (chronic obstructive pulmonary disease) (Chronic)     Presumed dx as pt never returned for spirometry Asked him to return in 34mofor spirometry. Start spiriva as pt endorsing chronic congestion, cough,  mild dyspnea. Pt agrees with plan.    Relevant Medications      SPIRIVA HANDIHALER 18 MCG IN CAPS    Other Visit Diagnoses   Special screening for malignant neoplasms, colon        Relevant Orders       Fecal occult blood, imunochemical        Follow up plan: Return in about 6 months (around 08/01/2014), or as needed, for follow up visit.

## 2014-01-29 NOTE — Assessment & Plan Note (Signed)
DRE/PSA reassuring today. Continue yearly check. S/p radiation treatment, pt declines return to urology.

## 2014-01-29 NOTE — Assessment & Plan Note (Signed)
Presumed dx as pt never returned for spirometry Asked him to return in 57mo for spirometry. Start spiriva as pt endorsing chronic congestion, cough, mild dyspnea. Pt agrees with plan.

## 2014-01-30 ENCOUNTER — Telehealth: Payer: Self-pay | Admitting: Family Medicine

## 2014-01-30 NOTE — Telephone Encounter (Signed)
Relevant patient education assigned to patient using Emmi. ° °

## 2014-06-01 ENCOUNTER — Ambulatory Visit: Payer: BC Managed Care – PPO | Admitting: Family Medicine

## 2014-06-04 ENCOUNTER — Other Ambulatory Visit: Payer: Self-pay | Admitting: *Deleted

## 2014-06-04 MED ORDER — TIOTROPIUM BROMIDE MONOHYDRATE 18 MCG IN CAPS: 18.0000 ug | ORAL_CAPSULE | Freq: Every day | RESPIRATORY_TRACT | Status: DC

## 2015-05-20 ENCOUNTER — Telehealth: Payer: Self-pay | Admitting: Family Medicine

## 2015-05-20 ENCOUNTER — Other Ambulatory Visit: Payer: Self-pay | Admitting: Family Medicine

## 2015-05-20 NOTE — Telephone Encounter (Signed)
Pt needs to schedule a physical with Dr. Danise Mina to continue medication refills. Left message to call us back.

## 2015-08-27 ENCOUNTER — Ambulatory Visit (INDEPENDENT_AMBULATORY_CARE_PROVIDER_SITE_OTHER): Payer: BLUE CROSS/BLUE SHIELD | Admitting: Family Medicine

## 2015-08-27 ENCOUNTER — Other Ambulatory Visit: Payer: Self-pay | Admitting: Family Medicine

## 2015-08-27 ENCOUNTER — Encounter: Payer: Self-pay | Admitting: Family Medicine

## 2015-08-27 VITALS — BP 136/80 | HR 57 | Temp 97.7°F | Ht 71.0 in | Wt 211.0 lb

## 2015-08-27 DIAGNOSIS — J449 Chronic obstructive pulmonary disease, unspecified: Secondary | ICD-10-CM

## 2015-08-27 DIAGNOSIS — J441 Chronic obstructive pulmonary disease with (acute) exacerbation: Secondary | ICD-10-CM

## 2015-08-27 DIAGNOSIS — Z7189 Other specified counseling: Secondary | ICD-10-CM | POA: Diagnosis not present

## 2015-08-27 DIAGNOSIS — D751 Secondary polycythemia: Secondary | ICD-10-CM | POA: Diagnosis not present

## 2015-08-27 DIAGNOSIS — Z1211 Encounter for screening for malignant neoplasm of colon: Secondary | ICD-10-CM

## 2015-08-27 DIAGNOSIS — F17200 Nicotine dependence, unspecified, uncomplicated: Secondary | ICD-10-CM

## 2015-08-27 DIAGNOSIS — C61 Malignant neoplasm of prostate: Secondary | ICD-10-CM

## 2015-08-27 DIAGNOSIS — I10 Essential (primary) hypertension: Secondary | ICD-10-CM

## 2015-08-27 DIAGNOSIS — E785 Hyperlipidemia, unspecified: Secondary | ICD-10-CM | POA: Diagnosis not present

## 2015-08-27 DIAGNOSIS — I2510 Atherosclerotic heart disease of native coronary artery without angina pectoris: Secondary | ICD-10-CM

## 2015-08-27 DIAGNOSIS — Z1159 Encounter for screening for other viral diseases: Secondary | ICD-10-CM

## 2015-08-27 DIAGNOSIS — Z0000 Encounter for general adult medical examination without abnormal findings: Secondary | ICD-10-CM | POA: Diagnosis not present

## 2015-08-27 DIAGNOSIS — Z720 Tobacco use: Secondary | ICD-10-CM

## 2015-08-27 LAB — LIPID PANEL
Cholesterol: 202 mg/dL — ABNORMAL HIGH (ref 0–200)
HDL: 60 mg/dL (ref >39.00)
LDL Cholesterol: 123 mg/dL — ABNORMAL HIGH (ref 0–99)
NonHDL: 141.63
Total CHOL/HDL Ratio: 3
Triglycerides: 91 mg/dL (ref 0.0–149.0)
VLDL: 18.2 mg/dL (ref 0.0–40.0)

## 2015-08-27 LAB — COMPREHENSIVE METABOLIC PANEL
ALT: 20 U/L (ref 0–53)
AST: 21 U/L (ref 0–37)
Albumin: 4.4 g/dL (ref 3.5–5.2)
Alkaline Phosphatase: 62 U/L (ref 39–117)
BUN: 12 mg/dL (ref 6–23)
CO2: 35 mEq/L — ABNORMAL HIGH (ref 19–32)
Calcium: 10 mg/dL (ref 8.4–10.5)
Chloride: 99 mEq/L (ref 96–112)
Creatinine, Ser: 0.85 mg/dL (ref 0.40–1.50)
GFR: 96.57 mL/min (ref >60.00)
Glucose, Bld: 105 mg/dL — ABNORMAL HIGH (ref 70–99)
Potassium: 4.5 mEq/L (ref 3.5–5.1)
Sodium: 139 mEq/L (ref 135–145)
Total Bilirubin: 1.2 mg/dL (ref 0.2–1.2)
Total Protein: 7.3 g/dL (ref 6.0–8.3)

## 2015-08-27 LAB — TSH: TSH: 0.88 u[IU]/mL (ref 0.35–4.50)

## 2015-08-27 LAB — PSA: PSA: 1.66 ng/mL (ref 0.10–4.00)

## 2015-08-27 MED ORDER — AZITHROMYCIN 250 MG PO TABS: ORAL_TABLET | ORAL | Status: DC

## 2015-08-27 MED ORDER — LOSARTAN POTASSIUM-HCTZ 100-25 MG PO TABS: 1.0000 | ORAL_TABLET | Freq: Every day | ORAL | Status: DC

## 2015-08-27 MED ORDER — PREDNISONE 20 MG PO TABS: ORAL_TABLET | ORAL | Status: DC

## 2015-08-27 MED ORDER — BUDESONIDE-FORMOTEROL FUMARATE 160-4.5 MCG/ACT IN AERO: 1.0000 | INHALATION_SPRAY | Freq: Two times a day (BID) | RESPIRATORY_TRACT | Status: DC

## 2015-08-27 NOTE — Assessment & Plan Note (Signed)
Preventative protocols reviewed and updated unless pt declined. Discussed healthy diet and lifestyle.  

## 2015-08-27 NOTE — Assessment & Plan Note (Signed)
Check FLP today. 

## 2015-08-27 NOTE — Assessment & Plan Note (Addendum)
S/p radiation therapy. Declined further f/u with uro. Check PSA today. Declines DRE.

## 2015-08-27 NOTE — Assessment & Plan Note (Signed)
Concern for ACEI-induced cough. Change lisinopril hctz to losartan hctz.

## 2015-08-27 NOTE — Assessment & Plan Note (Signed)
Check CBC today.  

## 2015-08-27 NOTE — Assessment & Plan Note (Signed)
Off statin. Check FLP today.

## 2015-08-27 NOTE — Assessment & Plan Note (Addendum)
Treat with zpack and prednisone course. Continue to encourage cessation

## 2015-08-27 NOTE — Patient Instructions (Addendum)
You'd be eligible for lung cancer screening CT scan. Call me if you'd like to have it done. Pass by lab for blood work and stool kit.  Stop lisinopril. Start losartan hctz 50/32m daily. Start monitoring blood pressure at local pharmacy and let me know if >140/90.  Call your insurance about the shingles shot to see if it is covered or how much it would cost and where is cheaper (here or pharmacy).  If you want to receive here, call for nurse visit.  For breathing - start zpack and prednisone course.  Start symbicort 1 puff twice daily for breathing - time around when you brush your teeth/rinse mouth with listerine. Return when breathing better (over next few months) for breathing test (spirometry).  Return in 1 year for next medicare wellness visit Advanced directive packet provided today  Health Maintenance, Male A healthy lifestyle and preventative care can promote health and wellness.  Maintain regular health, dental, and eye exams.  Eat a healthy diet. Foods like vegetables, fruits, whole grains, low-fat dairy products, and lean protein foods contain the nutrients you need and are low in calories. Decrease your intake of foods high in solid fats, added sugars, and salt. Get information about a proper diet from your health care provider, if necessary.  Regular physical exercise is one of the most important things you can do for your health. Most adults should get at least 150 minutes of moderate-intensity exercise (any activity that increases your heart rate and causes you to sweat) each week. In addition, most adults need muscle-strengthening exercises on 2 or more days a week.   Maintain a healthy weight. The body mass index (BMI) is a screening tool to identify possible weight problems. It provides an estimate of body fat based on height and weight. Your health care provider can find your BMI and can help you achieve or maintain a healthy weight. For males 20 years and older:  A BMI below  18.5 is considered underweight.  A BMI of 18.5 to 24.9 is normal.  A BMI of 25 to 29.9 is considered overweight.  A BMI of 30 and above is considered obese.  Maintain normal blood lipids and cholesterol by exercising and minimizing your intake of saturated fat. Eat a balanced diet with plenty of fruits and vegetables. Blood tests for lipids and cholesterol should begin at age 6271and be repeated every 5 years. If your lipid or cholesterol levels are high, you are over age 64 or you are at high risk for heart disease, you may need your cholesterol levels checked more frequently.Ongoing high lipid and cholesterol levels should be treated with medicines if diet and exercise are not working.  If you smoke, find out from your health care provider how to quit. If you do not use tobacco, do not start.  Lung cancer screening is recommended for adults aged 538-80years who are at high risk for developing lung cancer because of a history of smoking. A yearly low-dose CT scan of the lungs is recommended for people who have at least a 30-pack-year history of smoking and are current smokers or have quit within the past 15 years. A pack year of smoking is smoking an average of 1 pack of cigarettes a day for 1 year (for example, a 30-pack-year history of smoking could mean smoking 1 pack a day for 30 years or 2 packs a day for 15 years). Yearly screening should continue until the smoker has stopped smoking for at least 15  years. Yearly screening should be stopped for people who develop a health problem that would prevent them from having lung cancer treatment.  If you choose to drink alcohol, do not have more than 2 drinks per day. One drink is considered to be 12 oz (360 mL) of beer, 5 oz (150 mL) of wine, or 1.5 oz (45 mL) of liquor.  Avoid the use of street drugs. Do not share needles with anyone. Ask for help if you need support or instructions about stopping the use of drugs.  High blood pressure causes  heart disease and increases the risk of stroke. High blood pressure is more likely to develop in:  People who have blood pressure in the end of the normal range (100-139/85-89 mm Hg).  People who are overweight or obese.  People who are African American.  If you are 72-84 years of age, have your blood pressure checked every 3-5 years. If you are 48 years of age or older, have your blood pressure checked every year. You should have your blood pressure measured twice--once when you are at a hospital or clinic, and once when you are not at a hospital or clinic. Record the average of the two measurements. To check your blood pressure when you are not at a hospital or clinic, you can use:  An automated blood pressure machine at a pharmacy.  A home blood pressure monitor.  If you are 7-13 years old, ask your health care provider if you should take aspirin to prevent heart disease.  Diabetes screening involves taking a blood sample to check your fasting blood sugar level. This should be done once every 3 years after age 19 if you are at a normal weight and without risk factors for diabetes. Testing should be considered at a younger age or be carried out more frequently if you are overweight and have at least 1 risk factor for diabetes.  Colorectal cancer can be detected and often prevented. Most routine colorectal cancer screening begins at the age of 23 and continues through age 98. However, your health care provider may recommend screening at an earlier age if you have risk factors for colon cancer. On a yearly basis, your health care provider may provide home test kits to check for hidden blood in the stool. A small camera at the end of a tube may be used to directly examine the colon (sigmoidoscopy or colonoscopy) to detect the earliest forms of colorectal cancer. Talk to your health care provider about this at age 28 when routine screening begins. A direct exam of the colon should be repeated every  5-10 years through age 46, unless early forms of precancerous polyps or small growths are found.  People who are at an increased risk for hepatitis B should be screened for this virus. You are considered at high risk for hepatitis B if:  You were born in a country where hepatitis B occurs often. Talk with your health care provider about which countries are considered high risk.  Your parents were born in a high-risk country and you have not received a shot to protect against hepatitis B (hepatitis B vaccine).  You have HIV or AIDS.  You use needles to inject street drugs.  You live with, or have sex with, someone who has hepatitis B.  You are a man who has sex with other men (MSM).  You get hemodialysis treatment.  You take certain medicines for conditions like cancer, organ transplantation, and autoimmune conditions.  Hepatitis  C blood testing is recommended for all people born from [birth year] through 1964 and any individual with known risk factors for hepatitis C.  Healthy men should no longer receive prostate-specific antigen (PSA) blood tests as part of routine cancer screening. Talk to your health care provider about prostate cancer screening.  Testicular cancer screening is not recommended for adolescents or adult males who have no symptoms. Screening includes self-exam, a health care provider exam, and other screening tests. Consult with your health care provider about any symptoms you have or any concerns you have about testicular cancer.  Practice safe sex. Use condoms and avoid high-risk sexual practices to reduce the spread of sexually transmitted infections (STIs).  You should be screened for STIs, including gonorrhea and chlamydia if:  You are sexually active and are younger than 24 years.  You are older than 24 years, and your health care provider tells you that you are at risk for this type of infection.  Your sexual activity has changed since you were last screened, and  you are at an increased risk for chlamydia or gonorrhea. Ask your health care provider if you are at risk.  If you are at risk of being infected with HIV, it is recommended that you take a prescription medicine daily to prevent HIV infection. This is called pre-exposure prophylaxis (PrEP). You are considered at risk if:  You are a man who has sex with other men (MSM).  You are a heterosexual man who is sexually active with multiple partners.  You take drugs by injection.  You are sexually active with a partner who has HIV.  Talk with your health care provider about whether you are at high risk of being infected with HIV. If you choose to begin PrEP, you should first be tested for HIV. You should then be tested every 3 months for as long as you are taking PrEP.  Use sunscreen. Apply sunscreen liberally and repeatedly throughout the day. You should seek shade when your shadow is shorter than you. Protect yourself by wearing long sleeves, pants, a wide-brimmed hat, and sunglasses year round whenever you are outdoors.  Tell your health care provider of new moles or changes in moles, especially if there is a change in shape or color. Also, tell your health care provider if a mole is larger than the size of a pencil eraser.  A one-time screening for abdominal aortic aneurysm (AAA) and surgical repair of large AAAs by ultrasound is recommended for men aged 43-75 years who are current or former smokers.  Stay current with your vaccines (immunizations).   This information is not intended to replace advice given to you by your health care provider. Make sure you discuss any questions you have with your health care provider.   Document Released: 12/30/2007 Document Revised: 07/24/2014 Document Reviewed: 11/28/2010 Elsevier Interactive Patient Education [employer].

## 2015-08-27 NOTE — Assessment & Plan Note (Addendum)

## 2015-08-27 NOTE — Progress Notes (Signed)
Pre visit review using our clinic review tool, if applicable. No additional management support is needed unless otherwise documented below in the visit note. 

## 2015-08-27 NOTE — Assessment & Plan Note (Addendum)
Working on quitting - continue to encourage full cessation. Recommended lung cancer screening CT - will check with wife

## 2015-08-27 NOTE — Assessment & Plan Note (Signed)
spiriva unaffordable. Advised return for spirometry Sent in symbicort to price out

## 2015-08-27 NOTE — Progress Notes (Signed)
BP 136/80 mmHg  Pulse 57  Temp(Src) 97.7 F (36.5 C) (Tympanic)  Ht '5\' 11"'  (1.803 m)  Wt 211 lb (95.709 kg)  BMI 29.44 kg/m2  SpO2 95%   CC: CPE  Subjective:    Patient ID: Brian White, male    DOB: [birth year], 66 y.o.   MRN: [id number]  HPI: Brian White is a 65 y.o. male presenting on 08/27/2015 for Annual Exam   Has medicare A/B.   Presumed COPD - with recent increase in cough with mucous production, wheezing and mild dyspnea. Never returned for spirometry.  HTN - worried lisinopril causing dry cough. Doesn't check bp at home.  Smoker - trying to quit. 1ppd, hasn't smoked in 2 days.  Started using nutribullet for healthier eating.   Failed vision screen on right side 2014.  Hearing screen normal.  Denies falls in past year Denies depression/anxiety  Preventative: Colon cancer screening - discussed - would like stool kit. Denies blood in stool or bowel changes. Pt state he never received last year's ifob H/o prostate cancer - saw Dr. Gaynelle Arabian. S/p radiation only, denies seeds. Last saw 2013. Plan was to return to urology but declines return. Requests we continue checking PSA here but declines DRE.  Lung cancer screening: 75+ PY. Interested in screening.  Flu shot - does not receive  Td ~2009 Pneumovax - 11/2012 Shingles - will check with insurance if intersted Advanced directive: discussed with patient. He does not have set up but would want wife to be HCPOA. Advanced packet provided today Seat belt use discussed Sunscreen use discussed. No changing moles on skin  Lives with wife, dogs, chickens and goats  Occupation: retired. 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 Started using nutribullet.  Relevant past medical, surgical, family and social history reviewed and updated as indicated. Interim medical history since our last visit  reviewed. Allergies and medications reviewed and updated. Current Outpatient Prescriptions on File Prior to Visit  Medication Sig  . aspirin 81 MG tablet Take 81 mg by mouth daily.    Marland Kitchen EPINEPHrine (EPIPEN) 0.3 mg/0.3 mL DEVI Inject 0.3 mLs (0.3 mg total) into the muscle once.  . sildenafil (VIAGRA) 100 MG tablet Take 0.5-1 tablets (50-100 mg total) by mouth daily as needed for erectile dysfunction.   No current facility-administered medications on file prior to visit.    Review of Systems  Constitutional: Negative for fever, chills, activity change, appetite change, fatigue and unexpected weight change.  HENT: Negative for hearing loss.   Eyes: Negative for visual disturbance.  Respiratory: Positive for cough, shortness of breath and wheezing. Negative for chest tightness.   Cardiovascular: Negative for chest pain, palpitations and leg swelling.  Gastrointestinal: Negative for nausea, vomiting, abdominal pain, diarrhea, constipation, blood in stool and abdominal distention.  Genitourinary: Negative for hematuria and difficulty urinating.  Musculoskeletal: Negative for myalgias, arthralgias and neck pain.  Skin: Negative for rash.  Neurological: Negative for dizziness, seizures, syncope and headaches.  Hematological: Negative for adenopathy. Does not bruise/bleed easily.  Psychiatric/Behavioral: Negative for dysphoric mood. The patient is not nervous/anxious.    Per HPI unless specifically indicated in ROS section     Objective:    BP 136/80 mmHg  Pulse 57  Temp(Src) 97.7 F (36.5 C) (Tympanic)  Ht '5\' 11"'  (1.803 m)  Wt 211 lb (95.709 kg)  BMI 29.44 kg/m2  SpO2 95%  Wt  Readings from Last 3 Encounters:  08/27/15 211 lb (95.709 kg)  01/29/14 215 lb 4 oz (97.637 kg)  11/22/12 212 lb 8 oz (96.389 kg)    Physical Exam  Constitutional: He is oriented to person, place, and time. He appears well-developed and well-nourished. No distress.  HENT:  Head: Normocephalic and  atraumatic.  Right Ear: Hearing, tympanic membrane, external ear and ear canal normal.  Left Ear: Hearing, tympanic membrane, external ear and ear canal normal.  Nose: Nose normal.  Mouth/Throat: Uvula is midline, oropharynx is clear and moist and mucous membranes are normal. No oropharyngeal exudate, posterior oropharyngeal edema or posterior oropharyngeal erythema.  Eyes: Conjunctivae and EOM are normal. Pupils are equal, round, and reactive to light. No scleral icterus.  Neck: Normal range of motion. Neck supple. Carotid bruit is not present. No thyromegaly present.  Cardiovascular: Normal rate, regular rhythm, normal heart sounds and intact distal pulses.   No murmur heard. Pulses:      Radial pulses are 2+ on the right side, and 2+ on the left side.  Pulmonary/Chest: Effort normal. No respiratory distress. He has no wheezes. He has rhonchi. He has no rales.  Coarse breath sounds throughout  Abdominal: Soft. Bowel sounds are normal. He exhibits no distension and no mass. There is no tenderness. There is no rebound and no guarding.  Genitourinary:  DRE declined  Musculoskeletal: Normal range of motion. He exhibits no edema.  Lymphadenopathy:    He has no cervical adenopathy.  Neurological: He is alert and oriented to person, place, and time.  CN grossly intact, station and gait intact Recall 3/3  Calculation 5/5 serial 7s  Skin: Skin is warm and dry. No rash noted.  Psychiatric: He has a normal mood and affect. His behavior is normal. Judgment and thought content normal.  Nursing note and vitals reviewed.  Results for orders placed or performed in visit on 01/26/14  Lipid panel  Result Value Ref Range   Cholesterol 185 0 - 200 mg/dL   Triglycerides 100.0 0.0 - 149.0 mg/dL   HDL 55.00 >39.00 mg/dL   VLDL 20.0 0.0 - 40.0 mg/dL   LDL Cholesterol 110 (H) 0 - 99 mg/dL   Total CHOL/HDL Ratio 3    NonHDL 130.00   CBC with Differential  Result Value Ref Range   WBC 5.5 4.0 - 10.5  K/uL   RBC 5.01 4.22 - 5.81 Mil/uL   Hemoglobin 16.5 13.0 - 17.0 g/dL   HCT 50.1 39.0 - 52.0 %   MCV 100.0 78.0 - 100.0 fl   MCHC 33.0 30.0 - 36.0 g/dL   RDW 15.0 11.5 - 15.5 %   Platelets 152.0 150.0 - 400.0 K/uL   Neutrophils Relative % 47.0 43.0 - 77.0 %   Lymphocytes Relative 39.7 12.0 - 46.0 %   Monocytes Relative 10.9 3.0 - 12.0 %   Eosinophils Relative 1.8 0.0 - 5.0 %   Basophils Relative 0.6 0.0 - 3.0 %   Neutro Abs 2.6 1.4 - 7.7 K/uL   Lymphs Abs 2.2 0.7 - 4.0 K/uL   Monocytes Absolute 0.6 0.1 - 1.0 K/uL   Eosinophils Absolute 0.1 0.0 - 0.7 K/uL   Basophils Absolute 0.0 0.0 - 0.1 K/uL  Basic metabolic panel  Result Value Ref Range   Sodium 143 135 - 145 mEq/L   Potassium 5.3 (H) 3.5 - 5.1 mEq/L   Chloride 108 96 - 112 mEq/L   CO2 31 19 - 32 mEq/L   Glucose, Bld 98 70 -  99 mg/dL   BUN 15 6 - 23 mg/dL   Creatinine, Ser 1.0 0.4 - 1.5 mg/dL   Calcium 9.7 8.4 - 10.5 mg/dL   GFR 81.41 >60.00 mL/min  PSA  Result Value Ref Range   PSA 2.17 0.10 - 4.00 ng/mL      Assessment & Plan:   Problem List Items Addressed This Visit    Smoker    Working on quitting - continue to encourage full cessation. Recommended lung cancer screening CT - will check with wife       Prostate cancer [facility])    S/p radiation therapy. Declined further f/u with uro. Check PSA today. Declines DRE.      Relevant Medications   predniSONE (DELTASONE) 20 MG tablet   Other Relevant Orders   PSA   CBC with Differential/Platelet   Polycythemia    Check CBC today      Relevant Orders   TSH   CBC with Differential/Platelet   Medicare annual wellness visit, subsequent - Primary    I have personally reviewed the Medicare Annual Wellness questionnaire and have noted 1. The patient's medical and social history 2. Their use of alcohol, tobacco or illicit drugs 3. Their current medications and supplements 4. The patient's functional ability including ADL's, fall risks, home safety risks and hearing  or visual impairment. Cognitive function has been assessed and addressed as indicated.  5. Diet and physical activity 6. Evidence for depression or mood disorders The patients weight, height, BMI have been recorded in the chart. I have made referrals, counseling and provided education to the patient based on review of the above and I have provided the pt with a written personalized care plan for preventive services. Provider list updated.. See scanned questionairre as needed for further documentation. Reviewed preventative protocols and updated unless pt declined.       HTN (hypertension) (Chronic)    Concern for ACEI-induced cough. Change lisinopril hctz to losartan hctz.       Relevant Medications   losartan-hydrochlorothiazide (HYZAAR) 100-25 MG tablet   Other Relevant Orders   Comprehensive metabolic panel   TSH   HLD (hyperlipidemia)    Off statin. Check FLP today.      Relevant Medications   losartan-hydrochlorothiazide (HYZAAR) 100-25 MG tablet   Other Relevant Orders   Lipid panel   Comprehensive metabolic panel   Health care maintenance    Preventative protocols reviewed and updated unless pt declined. Discussed healthy diet and lifestyle.       COPD exacerbation (HCC)    Treat with zpack and prednisone course. Continue to encourage cessation      Relevant Medications   predniSONE (DELTASONE) 20 MG tablet   azithromycin (ZITHROMAX) 250 MG tablet   budesonide-formoterol (SYMBICORT) 160-4.5 MCG/ACT inhaler   COPD (chronic obstructive pulmonary disease) (HCC) (Chronic)    spiriva unaffordable. Advised return for spirometry Sent in symbicort to price out      Relevant Medications   predniSONE (DELTASONE) 20 MG tablet   azithromycin (ZITHROMAX) 250 MG tablet   budesonide-formoterol (SYMBICORT) 160-4.5 MCG/ACT inhaler   CAD (coronary artery disease)    Check FLP today      Relevant Medications   losartan-hydrochlorothiazide (HYZAAR) 100-25 MG tablet    Advanced care planning/counseling discussion    Advanced directive: discussed with patient. He does not have set up but would want wife to be HCPOA. Advanced packet provided today       Other Visit Diagnoses    Special screening for  malignant neoplasms, colon        Relevant Orders    Fecal occult blood, imunochemical    Need for hepatitis C screening test        Relevant Orders    Hepatitis C antibody, reflex        Follow up plan: Return in about 3 months (around 11/24/2015), or as needed.

## 2015-08-27 NOTE — Assessment & Plan Note (Signed)
Advanced directive: discussed with patient. He does not have set up but would want wife to be HCPOA. Advanced packet provided today

## 2015-08-28 LAB — HEPATITIS C ANTIBODY: HCV Ab: NEGATIVE

## 2015-08-30 ENCOUNTER — Telehealth: Payer: Self-pay | Admitting: Radiology

## 2015-08-30 LAB — CBC WITH DIFFERENTIAL/PLATELET
Basophils Absolute: 0 10*3/uL (ref 0.0–0.1)
Basophils Relative: 0.3 % (ref 0.0–3.0)
Eosinophils Absolute: 0.1 10*3/uL (ref 0.0–0.7)
Eosinophils Relative: 1.1 % (ref 0.0–5.0)
HCT: 54.8 % — ABNORMAL HIGH (ref 39.0–52.0)
Hemoglobin: 18.2 g/dL (ref 13.0–17.0)
Lymphocytes Relative: 26.4 % (ref 12.0–46.0)
Lymphs Abs: 1.8 10*3/uL (ref 0.7–4.0)
MCHC: 33.2 g/dL (ref 30.0–36.0)
MCV: 107.7 fl — ABNORMAL HIGH (ref 78.0–100.0)
Monocytes Absolute: 0.5 10*3/uL (ref 0.1–1.0)
Monocytes Relative: 6.4 % (ref 3.0–12.0)
Neutro Abs: 4.6 10*3/uL (ref 1.4–7.7)
Neutrophils Relative %: 65.8 % (ref 43.0–77.0)
Platelets: 133 10*3/uL — ABNORMAL LOW (ref 150.0–400.0)
RBC: 5.09 Mil/uL (ref 4.22–5.81)
RDW: 14.5 % (ref 11.5–15.5)
WBC: 7 10*3/uL (ref 4.0–10.5)

## 2015-08-30 NOTE — Telephone Encounter (Signed)
[facility] called a critical HGB - 18.2. Results given to Dr Danise Mina

## 2015-09-01 ENCOUNTER — Other Ambulatory Visit: Payer: Self-pay | Admitting: Family Medicine

## 2015-09-01 ENCOUNTER — Encounter: Payer: Self-pay | Admitting: Family Medicine

## 2015-09-01 DIAGNOSIS — D751 Secondary polycythemia: Secondary | ICD-10-CM

## 2015-09-02 ENCOUNTER — Other Ambulatory Visit: Payer: BLUE CROSS/BLUE SHIELD

## 2015-09-02 DIAGNOSIS — Z1211 Encounter for screening for malignant neoplasm of colon: Secondary | ICD-10-CM

## 2015-09-02 LAB — FECAL OCCULT BLOOD, IMMUNOCHEMICAL: Fecal Occult Bld: POSITIVE — AB

## 2015-09-02 NOTE — Telephone Encounter (Signed)
See results note. 

## 2015-09-04 ENCOUNTER — Other Ambulatory Visit: Payer: Self-pay | Admitting: Family Medicine

## 2015-09-04 DIAGNOSIS — Z1211 Encounter for screening for malignant neoplasm of colon: Secondary | ICD-10-CM

## 2015-09-07 ENCOUNTER — Encounter: Payer: Self-pay | Admitting: Gastroenterology

## 2015-09-07 ENCOUNTER — Telehealth: Payer: Self-pay | Admitting: Family Medicine

## 2015-09-07 DIAGNOSIS — F17200 Nicotine dependence, unspecified, uncomplicated: Secondary | ICD-10-CM

## 2015-09-07 NOTE — Telephone Encounter (Signed)
referred to lung cancer screening nurse

## 2015-09-07 NOTE — Telephone Encounter (Signed)
Patient called and scheduled lab appointment on 09/13/15.  Patient said he would like to be scheduled for a "lung screening".

## 2015-09-09 NOTE — Telephone Encounter (Signed)
Called patient and let him know that Eric Form will be contacting him to set up the consult.

## 2015-09-13 ENCOUNTER — Other Ambulatory Visit (INDEPENDENT_AMBULATORY_CARE_PROVIDER_SITE_OTHER): Payer: BLUE CROSS/BLUE SHIELD

## 2015-09-13 DIAGNOSIS — D751 Secondary polycythemia: Secondary | ICD-10-CM

## 2015-09-13 LAB — CBC WITH DIFFERENTIAL/PLATELET
Basophils Absolute: 0 10*3/uL (ref 0.0–0.1)
Basophils Relative: 0.4 % (ref 0.0–3.0)
Eosinophils Absolute: 0.1 10*3/uL (ref 0.0–0.7)
Eosinophils Relative: 1.9 % (ref 0.0–5.0)
HCT: 52.1 % — ABNORMAL HIGH (ref 39.0–52.0)
Hemoglobin: 17.9 g/dL — ABNORMAL HIGH (ref 13.0–17.0)
Lymphocytes Relative: 30.2 % (ref 12.0–46.0)
Lymphs Abs: 2.1 10*3/uL (ref 0.7–4.0)
MCHC: 34.3 g/dL (ref 30.0–36.0)
MCV: 101.1 fl — ABNORMAL HIGH (ref 78.0–100.0)
Monocytes Absolute: 0.7 10*3/uL (ref 0.1–1.0)
Monocytes Relative: 10.2 % (ref 3.0–12.0)
Neutro Abs: 4 10*3/uL (ref 1.4–7.7)
Neutrophils Relative %: 57.3 % (ref 43.0–77.0)
Platelets: 142 10*3/uL — ABNORMAL LOW (ref 150.0–400.0)
RBC: 5.15 Mil/uL (ref 4.22–5.81)
RDW: 15 % (ref 11.5–15.5)
WBC: 6.9 10*3/uL (ref 4.0–10.5)

## 2015-09-14 LAB — PATHOLOGIST SMEAR REVIEW

## 2015-09-27 ENCOUNTER — Other Ambulatory Visit: Payer: Self-pay | Admitting: Acute Care

## 2015-09-27 DIAGNOSIS — F17210 Nicotine dependence, cigarettes, uncomplicated: Secondary | ICD-10-CM

## 2015-09-28 ENCOUNTER — Ambulatory Visit (AMBULATORY_SURGERY_CENTER): Payer: Self-pay | Admitting: *Deleted

## 2015-09-28 VITALS — Ht 71.0 in | Wt 214.0 lb

## 2015-09-28 DIAGNOSIS — Z1211 Encounter for screening for malignant neoplasm of colon: Secondary | ICD-10-CM

## 2015-09-28 MED ORDER — NA SULFATE-K SULFATE-MG SULF 17.5-3.13-1.6 GM/177ML PO SOLN: 1.0000 | Freq: Once | ORAL | Status: DC

## 2015-09-28 NOTE — Progress Notes (Signed)
No egg or soy allergy known to patient  No issues with past sedation with any surgeries  or procedures, no intubation problems  No diet pills per patient No home 02 use per patient  No blood thinners per patient  Pt denies issues with constipation   

## 2015-10-04 ENCOUNTER — Ambulatory Visit (INDEPENDENT_AMBULATORY_CARE_PROVIDER_SITE_OTHER)
Admission: RE | Admit: 2015-10-04 | Discharge: 2015-10-04 | Disposition: A | Discharge status: Home / Self Care | Payer: BLUE CROSS/BLUE SHIELD | Source: Ambulatory Visit | Attending: Acute Care | Admitting: Acute Care

## 2015-10-04 ENCOUNTER — Ambulatory Visit (INDEPENDENT_AMBULATORY_CARE_PROVIDER_SITE_OTHER): Payer: BLUE CROSS/BLUE SHIELD | Admitting: Acute Care

## 2015-10-04 ENCOUNTER — Encounter: Payer: Self-pay | Admitting: Acute Care

## 2015-10-04 DIAGNOSIS — F17210 Nicotine dependence, cigarettes, uncomplicated: Secondary | ICD-10-CM

## 2015-10-04 NOTE — Progress Notes (Signed)
Shared Decision Making Visit Lung Cancer Screening Program [phone])   Eligibility:  Age 65 y.o.  Pack Years Smoking History Calculation 49 pack years (# packs/per year x # years smoked)  Recent History of coughing up blood  no  Unexplained weight loss? no ( >Than 15 pounds within the last 6 months )  Prior History Lung / other cancer yes, prostate early stage but no surveillance scans of chest. (Diagnosis within the last 5 years already requiring surveillance chest CT Scans).  Smoking Status Current Smoker  Former Smokers: Years since quit: Current soker  Quit Date: NA  Visit Components:  Discussion included one or more decision making aids. yes  Discussion included risk/benefits of screening. yes  Discussion included potential follow up diagnostic testing for abnormal scans. yes  Discussion included meaning and risk of over diagnosis. yes  Discussion included meaning and risk of False Positives. yes  Discussion included meaning of total radiation exposure. yes  Counseling Included:  Importance of adherence to annual lung cancer LDCT screening. yes  Impact of comorbidities on ability to participate in the program. yes  Ability and willingness to under diagnostic treatment. yes  Smoking Cessation Counseling:  Current Smokers:   Discussed importance of smoking cessation. yes  Information about tobacco cessation classes and interventions provided to patient. yes  Patient provided with "ticket" for LDCT Scan. yes  Symptomatic Patient. no  Counseling: NA  Diagnosis Code: Tobacco Use Z72.0  Asymptomatic Patient yes  Counseling (Intermediate counseling: > three minutes counseling) R4270  Former Smokers:   Discussed the importance of maintaining cigarette abstinence.NA, current smoker   Diagnosis Code: Personal History of Nicotine Dependence. W23.762  Information about tobacco cessation classes and interventions provided to patient. Yes  Patient provided  with "ticket" for LDCT Scan. yes  Written Order for Lung Cancer Screening with LDCT placed in Epic. Yes (CT Chest Lung Cancer Screening Low Dose W/O CM) [id number] Z12.2-Screening of respiratory organs Z87.891-Personal history of nicotine dependence  I have spent 15 minutes of face to face time with Brian White discussing the risks and benefits of lung cancer screening. We viewed a power point together that explained in detail the above noted topics. We paused at intervals to allow for questions to be asked and answered to ensure understanding.We discussed that the single most powerful action that he can take to decrease his risk of developing lung cancer is to quit smoking. We discussed whether or not he is ready to commit to setting a quit date. He is currently not ready to quit, or set a quit date We discussed options for tools to aid in quitting smoking including nicotine replacement therapy, non-nicotine medications, support groups, Quit Smart classes, and behavior modification. We discussed that often times setting smaller, more achievable goals, such as eliminating 1 cigarette a day for a week and then 2 cigarettes a day for a week can be helpful in slowly decreasing the number of cigarettes smoked. This allows for a sense of accomplishment as well as providing a clinical benefit. I gave Brian White the " Be Stronger Than Your Excuses" card with contact information for community resources, classes, free nicotine replacement therapy, and access to mobile apps, text messaging, and on-line smoking cessation help. I have also given him my card and contact information in the event he needs to contact me. We discussed the time and location of the scan, and that either June Leap, CMA, or I will call with the results within 24-48 hours of receiving them.  I have provided him with a copy of the power point we viewed  as a resource in the event they need reinforcement of the concepts we discussed today in the  office. The patient verbalized understanding of all of  the above and had no further questions upon leaving the office. They have my contact information in the event they have any further questions.   Magdalen Spatz, NP

## 2015-10-07 ENCOUNTER — Other Ambulatory Visit: Payer: Self-pay | Admitting: Acute Care

## 2015-10-07 ENCOUNTER — Telehealth: Payer: Self-pay | Admitting: Acute Care

## 2015-10-07 ENCOUNTER — Telehealth: Payer: Self-pay | Admitting: Family Medicine

## 2015-10-07 DIAGNOSIS — R911 Solitary pulmonary nodule: Secondary | ICD-10-CM

## 2015-10-07 NOTE — Telephone Encounter (Signed)
Judson Roch called to let you know they did a lung scan on mr Sant and the scan looks suspicious for primary lung cancer They have take care of getting him additional appointments.

## 2015-10-07 NOTE — Telephone Encounter (Signed)
Per Eric Form, NP, pt needs a consult with pulmonary md ASAP (by the end of next week) d/t recent CT findings.    Pt has a pending appt with RB on Monday.  However, he is also scheduled for colonoscopy same day.  Appt with our office needs to be rescheduled to a different day.    lmomtcb - At this time, AD has an opening today at 4.  Was going to offer this to pt.  If this does not work, will need to work pt in with a MD next week.

## 2015-10-08 ENCOUNTER — Encounter: Payer: Self-pay | Admitting: Family Medicine

## 2015-10-08 DIAGNOSIS — C3490 Malignant neoplasm of unspecified part of unspecified bronchus or lung: Secondary | ICD-10-CM

## 2015-10-08 DIAGNOSIS — I700 Atherosclerosis of aorta: Secondary | ICD-10-CM | POA: Insufficient documentation

## 2015-10-08 HISTORY — DX: Atherosclerosis of aorta: I70.0

## 2015-10-08 HISTORY — DX: Malignant neoplasm of unspecified part of unspecified bronchus or lung: C34.90

## 2015-10-08 NOTE — Telephone Encounter (Signed)
Pt wife cb, per [facility] previous message, I have rescheduled pt appt to 10/12/15 at 3:30pm w/JN. Pt wife verbalized understanding and nothing further is needed at this time

## 2015-10-08 NOTE — Telephone Encounter (Signed)
Noted thank you

## 2015-10-10 ENCOUNTER — Encounter: Payer: Self-pay | Admitting: Family Medicine

## 2015-10-11 ENCOUNTER — Ambulatory Visit (AMBULATORY_SURGERY_CENTER): Payer: BLUE CROSS/BLUE SHIELD | Admitting: Gastroenterology

## 2015-10-11 ENCOUNTER — Encounter (HOSPITAL_COMMUNITY): Payer: BLUE CROSS/BLUE SHIELD

## 2015-10-11 ENCOUNTER — Encounter: Payer: Self-pay | Admitting: Gastroenterology

## 2015-10-11 ENCOUNTER — Institutional Professional Consult (permissible substitution): Payer: BLUE CROSS/BLUE SHIELD | Admitting: Emergency Medicine

## 2015-10-11 ENCOUNTER — Telehealth: Payer: Self-pay | Admitting: Pulmonary Disease

## 2015-10-11 VITALS — BP 138/60 | HR 68 | Temp 97.5°F | Resp 19 | Ht 71.0 in | Wt 214.0 lb

## 2015-10-11 DIAGNOSIS — D125 Benign neoplasm of sigmoid colon: Secondary | ICD-10-CM | POA: Diagnosis not present

## 2015-10-11 DIAGNOSIS — Z1211 Encounter for screening for malignant neoplasm of colon: Secondary | ICD-10-CM

## 2015-10-11 DIAGNOSIS — D120 Benign neoplasm of cecum: Secondary | ICD-10-CM | POA: Diagnosis not present

## 2015-10-11 DIAGNOSIS — R198 Other specified symptoms and signs involving the digestive system and abdomen: Secondary | ICD-10-CM | POA: Diagnosis not present

## 2015-10-11 DIAGNOSIS — D128 Benign neoplasm of rectum: Secondary | ICD-10-CM

## 2015-10-11 DIAGNOSIS — D122 Benign neoplasm of ascending colon: Secondary | ICD-10-CM

## 2015-10-11 DIAGNOSIS — D123 Benign neoplasm of transverse colon: Secondary | ICD-10-CM | POA: Diagnosis not present

## 2015-10-11 DIAGNOSIS — K6289 Other specified diseases of anus and rectum: Secondary | ICD-10-CM

## 2015-10-11 DIAGNOSIS — D129 Benign neoplasm of anus and anal canal: Secondary | ICD-10-CM

## 2015-10-11 HISTORY — PX: COLONOSCOPY W/ BIOPSIES AND POLYPECTOMY: SHX1376

## 2015-10-11 MED ORDER — SODIUM CHLORIDE 0.9 % IV SOLN: 500.0000 mL | INTRAVENOUS | Status: DC

## 2015-10-11 NOTE — Telephone Encounter (Signed)
IMAGING LD CT CHEST W/O 10/04/15 (personally reviewed by me):  No pleural effusion or thickening. No pericardial effusion. 2.4x2.7 cm nodule in LLL on my measurements with precarinal lymph adenopathy that appears to be greater than 1cm in short axis.  LABS 09/13/15 CBC:  6.9/17.9/52.1/142

## 2015-10-11 NOTE — Progress Notes (Signed)
Called to room to assist during endoscopic procedure.  Patient ID and intended procedure confirmed with present staff. Received instructions for my participation in the procedure from the performing physician.  

## 2015-10-11 NOTE — Patient Instructions (Signed)
Handouts given : Polyps, Diverticulosis.  Await biopsy results.   YOU HAD AN ENDOSCOPIC PROCEDURE TODAY AT [facility]:   Refer to the procedure report that was given to you for any specific questions about what was found during the examination.  If the procedure report does not answer your questions, please call your gastroenterologist to clarify.  If you requested that your care partner not be given the details of your procedure findings, then the procedure report has been included in a sealed envelope for you to review at your convenience later.  YOU SHOULD EXPECT: Some feelings of bloating in the abdomen. Passage of more gas than usual.  Walking can help get rid of the air that was put into your GI tract during the procedure and reduce the bloating. If you had a lower endoscopy (such as a colonoscopy or flexible sigmoidoscopy) you may notice spotting of blood in your stool or on the toilet paper. If you underwent a bowel prep for your procedure, you may not have a normal bowel movement for a few days.  Please Note:  You might notice some irritation and congestion in your nose or some drainage.  This is from the oxygen used during your procedure.  There is no need for concern and it should clear up in a day or so.  SYMPTOMS TO REPORT IMMEDIATELY:   Following lower endoscopy (colonoscopy or flexible sigmoidoscopy):  Excessive amounts of blood in the stool  Significant tenderness or worsening of abdominal pains  Swelling of the abdomen that is new, acute  Fever of 100F or higher   For urgent or emergent issues, a gastroenterologist can be reached at any hour by calling [phone].   DIET: Your first meal following the procedure should be a small meal and then it is ok to progress to your normal diet. Heavy or fried foods are harder to digest and may make you feel nauseous or bloated.  Likewise, meals heavy in dairy and vegetables can increase bloating.  Drink plenty of  fluids but you should avoid alcoholic beverages for 24 hours.  ACTIVITY:  You should plan to take it easy for the rest of today and you should NOT DRIVE or use heavy machinery until tomorrow (because of the sedation medicines used during the test).    FOLLOW UP: Our staff will call the number listed on your records the next business day following your procedure to check on you and address any questions or concerns that you may have regarding the information given to you following your procedure. If we do not reach you, we will leave a message.  However, if you are feeling well and you are not experiencing any problems, there is no need to return our call.  We will assume that you have returned to your regular daily activities without incident.  If any biopsies were taken you will be contacted by phone or by letter within the next 1-3 weeks.  Please call us at [phone] if you have not heard about the biopsies in 3 weeks.    SIGNATURES/CONFIDENTIALITY: You and/or your care partner have signed paperwork which will be entered into your electronic medical record.  These signatures attest to the fact that that the information above on your After Visit Summary has been reviewed and is understood.  Full responsibility of the confidentiality of this discharge information lies with you and/or your care-partner.

## 2015-10-11 NOTE — Op Note (Signed)
Brian White Patient Name: Brian White Procedure Date: 10/11/2015 11:23 AM MRN: [id number] Endoscopist: Remo Lipps P. Havery Moros , MD Age: 65 Referring MD:  Date of Birth: January 18, 1951 Gender: Male Procedure:                Colonoscopy Indications:              Screening for colorectal malignant neoplasm. First                            time exam Medicines:                Monitored Anesthesia Care Procedure:                Pre-Anesthesia Assessment:                           - Prior to the procedure, a History and Physical                            was performed, and patient medications and                            allergies were reviewed. The patient's tolerance of                            previous anesthesia was also reviewed. The risks                            and benefits of the procedure and the sedation                            options and risks were discussed with the patient.                            All questions were answered, and informed consent                            was obtained. Prior Anticoagulants: The patient has                            taken aspirin, last dose was 1 day prior to                            procedure. ASA Grade Assessment: II - A patient                            with mild systemic disease. After reviewing the                            risks and benefits, the patient was deemed in                            satisfactory condition to undergo the procedure.  After obtaining informed consent, the colonoscope                            was passed under direct vision. Throughout the                            procedure, the patient's blood pressure, pulse, and                            oxygen saturations were monitored continuously. The                            Model CF-HQ190L [id number]) scope was introduced                            through the anus and advanced to the the cecum,         identified by appendiceal orifice and ileocecal                            valve. The colonoscopy was performed without                            difficulty. The patient tolerated the procedure                            well. The quality of the bowel preparation was                            adequate. The ileocecal valve, appendiceal orifice,                            and rectum were photographed. Scope In: 11:27:40 AM Scope Out: 11:55:17 AM Scope Withdrawal Time: 0 hours 24 minutes 47 seconds  Total Procedure Duration: 0 hours 27 minutes 37 seconds  Findings:      The perianal and digital rectal examinations were normal.      A 3 mm polyp was found in the cecum. The polyp was sessile. The polyp       was removed with a cold biopsy forceps. Resection and retrieval were       complete.      A 8 mm polyp was found in the ascending colon. The polyp was sessile.       The polyp was removed with a cold snare. Resection and retrieval were       complete.      A 5 mm polyp was found in the transverse colon. The polyp was sessile.       The polyp was removed with a cold snare. Resection and retrieval were       complete.      Two sessile polyps were found in the sigmoid colon. The polyps were 3 to       4 mm in size. These polyps were removed with a cold snare. Resection and       retrieval were complete.      A 3 mm polyp was found in the rectum. The polyp was sessile. The polyp  was removed with a cold biopsy forceps. Resection and retrieval were       complete.      A roughly 3 cm polypoid lesion was found in the rectum, approximately       10-15cm from the anal verge. The lesion was polypoid with a possible       central area of depression when pleated back with the forceps. Multiple       biopsies were taken to rule out malignancy. The opposite wall of the       colon was tattooed in this area with an injection of Spot (carbon       black). Attempts to removed this lesion  endoscopically were not made       today given the need to rule out malignancy given some of its features.       However if biopsies do not show malignancy, it may be amenable to       endoscopic resection.      Scattered small-mouthed diverticula were found in the sigmoid colon.      Non-bleeding internal hemorrhoids were found during retroflexion. The       hemorrhoids were small.      The exam was otherwise without abnormality. Complications:            No immediate complications. Estimated blood loss:                            Minimal. Estimated Blood Loss:     Estimated blood loss was minimal. Impression:               - One 3 mm polyp in the cecum, removed with a cold                            biopsy forceps. Resected and retrieved.                           - One 8 mm polyp in the ascending colon, removed                            with a cold snare. Resected and retrieved.                           - One 5 mm polyp in the transverse colon, removed                            with a cold snare. Resected and retrieved.                           - Two 3 to 4 mm polyps in the sigmoid colon,                            removed with a cold snare. Resected and retrieved.                           - One 3 mm polyp in the rectum, removed with a cold  biopsy forceps. Resected and retrieved.                           - Polypoid lesion in the rectum as described above.                            Biopsied. Tattooed.                           - Diverticulosis in the sigmoid colon.                           - Non-bleeding internal hemorrhoids.                           - The examination was otherwise normal. Recommendation:           - Patient has a contact number available for                            emergencies. The signs and symptoms of potential                            delayed complications were discussed with the                            patient. Return to  normal activities tomorrow.                            Written discharge instructions were provided to the                            patient.                           - Resume previous diet.                           - Continue present medications.                           - No aspirin, ibuprofen, naproxen, or other                            non-steroidal anti-inflammatory drugs for 2 weeks                            after polyp removal.                           - Await pathology results.                           - Repeat colonoscopy is recommended for                            surveillance. The colonoscopy date will be  determined after pathology results from today's                            exam become available for review. Pending biopsy                            results do not show malignancy, we may proceed with                            endoscopic attempt at resection, case to be done at                            the hospital. Procedure Code(s):        --- Professional ---                           [phone], Colonoscopy, flexible; with removal of                            tumor(s), polyp(s), or other lesion(s) by snare                            technique                           45380, 59, Colonoscopy, flexible; with biopsy,                            single or multiple                           45381, Colonoscopy, flexible; with directed                            submucosal injection(s), any substance CPT copyright 2016 American Medical Association. All rights reserved. Remo Lipps P. Armbruster, MD 10/11/2015 12:09:22 PM This report has been signed electronically. Number of Addenda: 0 Referring MD:      Scherrie November

## 2015-10-12 ENCOUNTER — Ambulatory Visit (INDEPENDENT_AMBULATORY_CARE_PROVIDER_SITE_OTHER): Payer: BLUE CROSS/BLUE SHIELD | Admitting: Pulmonary Disease

## 2015-10-12 ENCOUNTER — Encounter: Payer: Self-pay | Admitting: Pulmonary Disease

## 2015-10-12 ENCOUNTER — Telehealth: Payer: Self-pay | Admitting: *Deleted

## 2015-10-12 VITALS — BP 142/78 | HR 57 | Ht 71.0 in | Wt 210.6 lb

## 2015-10-12 DIAGNOSIS — R590 Localized enlarged lymph nodes: Secondary | ICD-10-CM

## 2015-10-12 DIAGNOSIS — R911 Solitary pulmonary nodule: Secondary | ICD-10-CM | POA: Diagnosis not present

## 2015-10-12 DIAGNOSIS — F17200 Nicotine dependence, unspecified, uncomplicated: Secondary | ICD-10-CM

## 2015-10-12 DIAGNOSIS — R599 Enlarged lymph nodes, unspecified: Secondary | ICD-10-CM | POA: Diagnosis not present

## 2015-10-12 DIAGNOSIS — J449 Chronic obstructive pulmonary disease, unspecified: Secondary | ICD-10-CM | POA: Diagnosis not present

## 2015-10-12 DIAGNOSIS — IMO0001 Reserved for inherently not codable concepts without codable children: Secondary | ICD-10-CM

## 2015-10-12 DIAGNOSIS — I2510 Atherosclerotic heart disease of native coronary artery without angina pectoris: Secondary | ICD-10-CM

## 2015-10-12 NOTE — Progress Notes (Signed)
Subjective:    Patient ID: Brian White, male    DOB: 1950-11-22, 65 y.o.   MRN: [id number]  HPI Patient recently had a chest CT scan for screening. Denies any cough. Denies any wheezing. Reports dyspnea with exertion. No chest pain or pressure. No fever, chills, sweats, or weight loss. No dysphagia or odynophagia. No adenopathy in neck, groin, or axilla. No rashes or bruising. Had a colonoscopy yesterday with removal of 6 polyps by Dr. Havery Moros. Does have a h/o prostate cancer & XRT. On Symbicort for "possible" COPD but has never had breathing tests.   Review of Systems No dysuria or hematuria. No headaches. No focal vision loss, weakness, or tingling. A pertinent 14 point review of systems is negative except as per the history of presenting illness.  Allergies  Allergen Reactions  . Onion Anaphylaxis    Hives     Current Outpatient Prescriptions on File Prior to Visit  Medication Sig Dispense Refill  . aspirin 81 MG tablet Take 81 mg by mouth daily.      . budesonide-formoterol (SYMBICORT) 160-4.5 MCG/ACT inhaler Inhale 1 puff into the lungs 2 (two) times daily. 1 Inhaler 3  . cetirizine (ZYRTEC) 10 MG chewable tablet Chew 10 mg by mouth daily.    Marland Kitchen EPINEPHrine (EPIPEN) 0.3 mg/0.3 mL DEVI Inject 0.3 mLs (0.3 mg total) into the muscle once. 1 Device prn  . ibuprofen (ADVIL,MOTRIN) 200 MG tablet Take 600 mg by mouth every 6 (six) hours as needed.    Marland Kitchen losartan-hydrochlorothiazide (HYZAAR) 100-25 MG tablet Take 1 tablet by mouth daily. 90 tablet 3  . sildenafil (VIAGRA) 100 MG tablet Take 0.5-1 tablets (50-100 mg total) by mouth daily as needed for erectile dysfunction. 4 tablet 0   No current facility-administered medications on file prior to visit.    Past Medical History  Diagnosis Date  . Hypertension   . Arthritis   . CAD (coronary artery disease)     mild MI; pt unaware - seen on stress test  . Prostate cancer [facility]) 2013    s/p radiation therapy March 2013, planned f/u  with uro after XRT  . H/O seasonal allergies   . H/O anaphylactic shock 2013    hives,hypotension,syncope  . Smoker   . ED (erectile dysfunction)   . Polycythemia 01/25/2014  . Allergy   . Pulmonary nodule, left 10/08/2015    2.4cm spiculated LLL nodule suspicious for primary bronchogenic carcinoma by CT 09/2015   . Thoracic aorta atherosclerosis ([place]) 10/08/2015    Mild by CT 09/2015   . COPD (chronic obstructive pulmonary disease) ([place]) 06/26/2011    Mild centrilobular and paraseptal emphysema by CT 09/2015     Past Surgical History  Procedure Laterality Date  . Hernia repair Right 2005    with mesh  . Insertion prostate radiation seed  08/2011    prostate cancer, [place]  . Colonoscopy w/ biopsies and polypectomy  10/11/15    Family History  Problem Relation Age of Onset  . Asthma Father   . COPD Father   . CAD Father     possibly  . Emphysema Father   . Breast cancer Sister   . Hypertension Mother   . Diabetes Neg Hx   . Stroke Neg Hx   . Colon cancer Neg Hx   . Colon polyps Neg Hx   . Rectal cancer Neg Hx   . Stomach cancer Neg Hx     Social History   Social History  . Marital  Status: Married    Spouse Name: N/A  . Number of Children: Y  . Years of Education: N/A   Occupational History  . retired    Social History Main Topics  . Smoking status: Current Every Day Smoker -- 1.00 packs/day for 49 years    Types: Cigarettes    Start date: 01/16/1967  . Smokeless tobacco: Never Used     Comment: Peak rate of 2ppd  . Alcohol Use: 0.6 oz/week    1 Shots of liquor per week     Comment: 2-3 beers or mixed drinks daily    . Drug Use: Yes    Special: Marijuana     Comment: marijuana occasional weekly  . Sexual Activity: Yes    Birth Control/ Protection: None   Other Topics Concern  . None   Social History Narrative   Lives with wife, dogs, chickens and goats   Occupation: retired.  On disability for herinated discs in back   Edu: HS   Activity: works  outside [employer], takes care of farm animals   Diet: good water, fruits/vegetables daily      [facility]:   Originally from [place]. 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 No known asbestos exposure. Questionable mold exposure. Has 2 dogs currently. No recent bird exposure. Previously had chickens.       Objective:   Physical Exam BP 142/78 mmHg  Pulse 57  Ht '5\' 11"'$  (1.803 m)  Wt 210 lb 9.6 oz (95.528 kg)  BMI 29.39 kg/m2  SpO2 96% General:  Awake. Alert. No acute distress. ASA Class I.  Integument:  Warm & dry. No rash on exposed skin.  Lymphatics:  No appreciated cervical or supraclavicular lymphadenoapthy. HEENT:  Moist mucus membranes. No oral ulcers. No scleral injection or icterus. Mallampati Class III. Cardiovascular:  Regular rate. No edema. No appreciable JVD.  Pulmonary:  Good aeration & clear to auscultation bilaterally. Symmetric chest wall expansion. No accessory muscle use. Abdomen: Soft. Normal bowel sounds. Protuberant. Grossly nontender. Musculoskeletal:  Normal bulk and tone. Hand grip strength 5/5 bilaterally. No joint deformity or effusion appreciated. Neurological:  CN 2-12 grossly in tact. No meningismus. Moving all 4 extremities equally. Symmetric brachioradialis deep tendon reflexes. Psychiatric:  Mood and affect congruent. Speech normal rhythm, rate & tone.   IMAGING LD CT CHEST W/O 10/04/15 (personally reviewed by me): No pleural effusion or thickening. No pericardial effusion. 2.4x2.7 cm nodule in LLL on my measurements with precarinal lymph adenopathy that appears to be greater than 1cm in short axis.  LABS 09/13/15 CBC: 6.9/17.9/52.1/142    Assessment & Plan:  65 year old male with ongoing tobacco use and incidentally found left lower lobe lung nodule and pathologic mediastinal adenopathy on lung cancer screening CT. Given his ongoing and history of tobacco use the potential  for malignancy is certainly high. I spent a significant amount time today discussing my concern that this represents malignancy with spread to at least ipsilateral mediastinal lymph nodes. We also discussed the need for appropriate diagnosis of biopsy and staging. To that end, I discussed bronchoscopy with endobronchial ultrasound-guided fine-needle aspiration of his mediastinal lymph nodes. We discussed the risks of the procedure including medication allergy, bleeding, pneumothorax, vocal cord injury, infection, and potentially death. The patient is willing to undergo the procedure if necessary. This will depend upon the results of his PET/CT scan. Overall he seems to be stable with regards to his underlying  presumed COPD. I instructed him to contact my office if he had any further questions or concerns.  1. Left lower lobe lung nodule with mediastinal adenopathy: PET CT scan already scheduled for tomorrow. Plan for bronchoscopy with endobronchial ultrasound guided fine-needle aspiration either next Monday with the following Monday at [facility]. Plan for MAC given his long history of alcohol use and potential need for high levels of sedatives. I would prefer to avoid intubation given the need for high level lymph node sampling within mediastinum. 2. Probable COPD: Patient scheduled for pulmonary function testing tomorrow. Continuing on Symbicort. 3. Ongoing tobacco use: Plan to readdress at follow-up appointment. 4. Follow-up: Patient to return to clinic in 2-4 weeks.  Sonia Baller Ashok Cordia, M.D. [facility] Pager:  [phone] After 3pm or if no response, call [phone] 4:59 PM 10/12/2015

## 2015-10-12 NOTE — Patient Instructions (Addendum)
   We will schedule you for a biopsy with bronchoscopy either this Monday coming or the following Monday.  I will call you with the result of your PET CT to discuss whether or not we are going ahead with the biopsy as we discussed today.  Keep your appointments for your PET CT and breathing test.  I will see you back in 2-4 weeks but please call if you have any questions or concerns.

## 2015-10-12 NOTE — Telephone Encounter (Signed)
  Follow up Call-  Call back number 10/11/2015  Post procedure Call Back phone  # [phone]  Permission to leave phone message Yes     Patient questions:  Do you have a fever, pain , or abdominal swelling? No. Pain Score  0 *  Have you tolerated food without any problems? Yes.    Have you been able to return to your normal activities? Yes.    Do you have any questions about your discharge instructions: Diet   No. Medications  No. Follow up visit  No.  Do you have questions or concerns about your Care? No.  Actions: * If pain score is 4 or above: No action needed, pain <4.

## 2015-10-13 ENCOUNTER — Ambulatory Visit (HOSPITAL_COMMUNITY)
Admission: RE | Admit: 2015-10-13 | Discharge: 2015-10-13 | Disposition: A | Discharge status: Home / Self Care | Payer: BLUE CROSS/BLUE SHIELD | Source: Ambulatory Visit | Attending: Acute Care | Admitting: Acute Care

## 2015-10-13 ENCOUNTER — Telehealth: Payer: Self-pay | Admitting: *Deleted

## 2015-10-13 DIAGNOSIS — R911 Solitary pulmonary nodule: Secondary | ICD-10-CM

## 2015-10-13 DIAGNOSIS — R590 Localized enlarged lymph nodes: Secondary | ICD-10-CM | POA: Diagnosis not present

## 2015-10-13 LAB — PULMONARY FUNCTION TEST
DL/VA % pred: 86 %
DL/VA: 4.02 ml/min/mmHg/L
DLCO unc % pred: 61 %
DLCO unc: 20.87 ml/min/mmHg
FEF 25-75 Post: 1.16 L/sec
FEF 25-75 Pre: 0.92 L/sec
FEF2575-%Change-Post: 26 %
FEF2575-%Pred-Post: 40 %
FEF2575-%Pred-Pre: 31 %
FEV1-%Change-Post: 9 %
FEV1-%Pred-Post: 49 %
FEV1-%Pred-Pre: 45 %
FEV1-Post: 1.79 L
FEV1-Pre: 1.63 L
FEV1FVC-%Change-Post: 0 %
FEV1FVC-%Pred-Pre: 75 %
FEV6-%Change-Post: 6 %
FEV6-%Pred-Post: 65 %
FEV6-%Pred-Pre: 61 %
FEV6-Post: 3 L
FEV6-Pre: 2.81 L
FEV6FVC-%Change-Post: -1 %
FEV6FVC-%Pred-Post: 101 %
FEV6FVC-%Pred-Pre: 103 %
FVC-%Change-Post: 9 %
FVC-%Pred-Post: 64 %
FVC-%Pred-Pre: 59 %
FVC-Post: 3.11 L
FVC-Pre: 2.86 L
Post FEV1/FVC ratio: 57 %
Post FEV6/FVC ratio: 97 %
Pre FEV1/FVC ratio: 57 %
Pre FEV6/FVC Ratio: 98 %
RV % pred: 158 %
RV: 3.76 L
TLC % pred: 96 %
TLC: 6.93 L

## 2015-10-13 LAB — GLUCOSE, CAPILLARY: Glucose-Capillary: 112 mg/dL — ABNORMAL HIGH (ref 65–99)

## 2015-10-13 MED ORDER — FLUDEOXYGLUCOSE F - 18 (FDG) INJECTION
10.2000 | Freq: Once | INTRAVENOUS | Status: DC | PRN
Start: 2015-10-13 — End: 2015-10-19

## 2015-10-13 MED ORDER — ALBUTEROL SULFATE (2.5 MG/3ML) 0.083% IN NEBU
2.5000 mg | INHALATION_SOLUTION | Freq: Once | RESPIRATORY_TRACT | Status: AC
  Administered 2015-10-13: 2.5 mg via RESPIRATORY_TRACT

## 2015-10-13 NOTE — Telephone Encounter (Signed)
error 

## 2015-10-14 ENCOUNTER — Encounter (HOSPITAL_COMMUNITY): Payer: Self-pay | Admitting: *Deleted

## 2015-10-18 ENCOUNTER — Ambulatory Visit (HOSPITAL_COMMUNITY)
Admission: RE | Admit: 2015-10-18 | Payer: BLUE CROSS/BLUE SHIELD | Source: Ambulatory Visit | Admitting: Pulmonary Disease

## 2015-10-18 ENCOUNTER — Encounter (HOSPITAL_COMMUNITY)
Admission: RE | Disposition: A | Discharge status: Home / Self Care | Payer: Self-pay | Source: Ambulatory Visit | Attending: Pulmonary Disease

## 2015-10-18 ENCOUNTER — Ambulatory Visit (HOSPITAL_COMMUNITY): Payer: BLUE CROSS/BLUE SHIELD

## 2015-10-18 ENCOUNTER — Encounter (HOSPITAL_COMMUNITY): Payer: Self-pay

## 2015-10-18 ENCOUNTER — Encounter (HOSPITAL_COMMUNITY): Admission: RE | Payer: Self-pay | Source: Ambulatory Visit

## 2015-10-18 ENCOUNTER — Ambulatory Visit (HOSPITAL_COMMUNITY)
Admission: RE | Admit: 2015-10-18 | Discharge: 2015-10-18 | Disposition: A | Discharge status: Home / Self Care | Payer: BLUE CROSS/BLUE SHIELD | Source: Ambulatory Visit | Attending: Pulmonary Disease | Admitting: Pulmonary Disease

## 2015-10-18 ENCOUNTER — Ambulatory Visit (HOSPITAL_COMMUNITY): Payer: BLUE CROSS/BLUE SHIELD | Admitting: Certified Registered Nurse Anesthetist

## 2015-10-18 DIAGNOSIS — IMO0001 Reserved for inherently not codable concepts without codable children: Secondary | ICD-10-CM | POA: Insufficient documentation

## 2015-10-18 DIAGNOSIS — Z86010 Personal history of colonic polyps: Secondary | ICD-10-CM | POA: Diagnosis not present

## 2015-10-18 DIAGNOSIS — I10 Essential (primary) hypertension: Secondary | ICD-10-CM | POA: Insufficient documentation

## 2015-10-18 DIAGNOSIS — Z7951 Long term (current) use of inhaled steroids: Secondary | ICD-10-CM | POA: Diagnosis not present

## 2015-10-18 DIAGNOSIS — M1990 Unspecified osteoarthritis, unspecified site: Secondary | ICD-10-CM | POA: Insufficient documentation

## 2015-10-18 DIAGNOSIS — C771 Secondary and unspecified malignant neoplasm of intrathoracic lymph nodes: Secondary | ICD-10-CM | POA: Diagnosis not present

## 2015-10-18 DIAGNOSIS — Z7982 Long term (current) use of aspirin: Secondary | ICD-10-CM | POA: Insufficient documentation

## 2015-10-18 DIAGNOSIS — Z791 Long term (current) use of non-steroidal anti-inflammatories (NSAID): Secondary | ICD-10-CM | POA: Diagnosis not present

## 2015-10-18 DIAGNOSIS — R911 Solitary pulmonary nodule: Secondary | ICD-10-CM | POA: Diagnosis not present

## 2015-10-18 DIAGNOSIS — Z79899 Other long term (current) drug therapy: Secondary | ICD-10-CM | POA: Insufficient documentation

## 2015-10-18 DIAGNOSIS — C801 Malignant (primary) neoplasm, unspecified: Secondary | ICD-10-CM | POA: Diagnosis not present

## 2015-10-18 DIAGNOSIS — F17200 Nicotine dependence, unspecified, uncomplicated: Secondary | ICD-10-CM | POA: Diagnosis not present

## 2015-10-18 DIAGNOSIS — I2510 Atherosclerotic heart disease of native coronary artery without angina pectoris: Secondary | ICD-10-CM | POA: Diagnosis not present

## 2015-10-18 DIAGNOSIS — I252 Old myocardial infarction: Secondary | ICD-10-CM | POA: Diagnosis not present

## 2015-10-18 DIAGNOSIS — Z8546 Personal history of malignant neoplasm of prostate: Secondary | ICD-10-CM | POA: Diagnosis not present

## 2015-10-18 DIAGNOSIS — I739 Peripheral vascular disease, unspecified: Secondary | ICD-10-CM | POA: Insufficient documentation

## 2015-10-18 DIAGNOSIS — Z923 Personal history of irradiation: Secondary | ICD-10-CM | POA: Insufficient documentation

## 2015-10-18 DIAGNOSIS — J449 Chronic obstructive pulmonary disease, unspecified: Secondary | ICD-10-CM | POA: Insufficient documentation

## 2015-10-18 DIAGNOSIS — I517 Cardiomegaly: Secondary | ICD-10-CM | POA: Diagnosis not present

## 2015-10-18 HISTORY — DX: Malignant neoplasm of unspecified part of unspecified bronchus or lung: C34.90

## 2015-10-18 HISTORY — PX: ENDOBRONCHIAL ULTRASOUND: SHX5096

## 2015-10-18 SURGERY — ENDOBRONCHIAL ULTRASOUND (EBUS)
Anesthesia: Monitor Anesthesia Care | Laterality: Bilateral

## 2015-10-18 SURGERY — ENDOBRONCHIAL ULTRASOUND (EBUS)
Anesthesia: General | Laterality: Bilateral

## 2015-10-18 MED ORDER — GLYCOPYRROLATE 0.2 MG/ML IJ SOLN
INTRAMUSCULAR | Status: DC | PRN
  Administered 2015-10-18: 0.2 mg via INTRAVENOUS

## 2015-10-18 MED ORDER — ONDANSETRON HCL 4 MG/2ML IJ SOLN
INTRAMUSCULAR | Status: AC
  Filled 2015-10-18: qty 2

## 2015-10-18 MED ORDER — SODIUM CHLORIDE 0.9 % IV SOLN: Freq: Once | INTRAVENOUS | Status: DC

## 2015-10-18 MED ORDER — MIDAZOLAM HCL 5 MG/5ML IJ SOLN
INTRAMUSCULAR | Status: DC | PRN
  Administered 2015-10-18: 2 mg via INTRAVENOUS

## 2015-10-18 MED ORDER — SODIUM CHLORIDE 0.9 % IJ SOLN
INTRAMUSCULAR | Status: AC
  Filled 2015-10-18: qty 10

## 2015-10-18 MED ORDER — IPRATROPIUM-ALBUTEROL 0.5-2.5 (3) MG/3ML IN SOLN
RESPIRATORY_TRACT | Status: AC
  Filled 2015-10-18: qty 3

## 2015-10-18 MED ORDER — EPHEDRINE SULFATE 50 MG/ML IJ SOLN
INTRAMUSCULAR | Status: AC
  Filled 2015-10-18: qty 1

## 2015-10-18 MED ORDER — LACTATED RINGERS IV SOLN
INTRAVENOUS | Status: DC
  Administered 2015-10-18: 08:00:00 via INTRAVENOUS

## 2015-10-18 MED ORDER — IPRATROPIUM-ALBUTEROL 20-100 MCG/ACT IN AERS
INHALATION_SPRAY | RESPIRATORY_TRACT | Status: DC | PRN
  Administered 2015-10-18: 1 via RESPIRATORY_TRACT

## 2015-10-18 MED ORDER — MIDAZOLAM HCL 2 MG/2ML IJ SOLN
INTRAMUSCULAR | Status: AC
  Filled 2015-10-18: qty 2

## 2015-10-18 MED ORDER — PROPOFOL 10 MG/ML IV BOLUS
INTRAVENOUS | Status: AC
  Filled 2015-10-18: qty 40

## 2015-10-18 MED ORDER — PROPOFOL 500 MG/50ML IV EMUL
INTRAVENOUS | Status: DC | PRN
  Administered 2015-10-18: 200 ug/kg/min via INTRAVENOUS

## 2015-10-18 MED ORDER — PROPOFOL 10 MG/ML IV BOLUS
INTRAVENOUS | Status: AC
  Filled 2015-10-18: qty 20

## 2015-10-18 MED ORDER — ONDANSETRON HCL 4 MG/2ML IJ SOLN
INTRAMUSCULAR | Status: DC | PRN
  Administered 2015-10-18: 4 mg via INTRAVENOUS

## 2015-10-18 MED ORDER — LIDOCAINE VISCOUS 2 % MT SOLN
OROMUCOSAL | Status: AC
  Filled 2015-10-18: qty 15

## 2015-10-18 MED ORDER — LIDOCAINE HCL (PF) 4 % IJ SOLN
5.0000 mL | INTRAMUSCULAR | Status: AC
  Administered 2015-10-18: 5 mL
  Filled 2015-10-18: qty 5

## 2015-10-18 MED ORDER — LIDOCAINE VISCOUS 2 % MT SOLN
OROMUCOSAL | Status: AC
Start: 2015-10-18 — End: 2015-10-18
  Filled 2015-10-18: qty 15

## 2015-10-18 MED ORDER — GLYCOPYRROLATE 0.2 MG/ML IJ SOLN
INTRAMUSCULAR | Status: AC
  Filled 2015-10-18: qty 1

## 2015-10-18 NOTE — Discharge Instructions (Signed)
Moderate Conscious Sedation, Adult, Care After Refer to this sheet in the next few weeks. These instructions provide you with information on caring for yourself after your procedure. Your health care provider may also give you more specific instructions. Your treatment has been planned according to current medical practices, but problems sometimes occur. Call your health care provider if you have any problems or questions after your procedure. WHAT TO EXPECT AFTER THE PROCEDURE  After your procedure:  You may feel sleepy, clumsy, and have poor balance for several hours.  Vomiting may occur if you eat too soon after the procedure. HOME CARE INSTRUCTIONS  Do not participate in any activities where you could become injured for at least 24 hours. Do not:  Drive.  Swim.  Ride a bicycle.  Operate heavy machinery.  Cook.  Use power tools.  Climb ladders.  Work from a high place.  Do not make important decisions or sign legal documents until you are improved.  If you vomit, drink water, juice, or soup when you can drink without vomiting. Make sure you have little or no nausea before eating solid foods.  Only take over-the-counter or prescription medicines for pain, discomfort, or fever as directed by your health care provider.  Make sure you and your family fully understand everything about the medicines given to you, including what side effects may occur.  You should not drink alcohol, take sleeping pills, or take medicines that cause drowsiness for at least 24 hours.  If you smoke, do not smoke without supervision.  If you are feeling better, you may resume normal activities 24 hours after you were sedated.  Keep all appointments with your health care provider. SEEK MEDICAL CARE IF:  Your skin is pale or bluish in color.  You continue to feel nauseous or vomit.  Your pain is getting worse and is not helped by medicine.  You have bleeding or swelling.  You are still  sleepy or feeling clumsy after 24 hours. SEEK IMMEDIATE MEDICAL CARE IF:  You develop a rash.  You have difficulty breathing.  You develop any type of allergic problem.  You have a fever. MAKE SURE YOU:  Understand these instructions.  Will watch your condition.  Will get help right away if you are not doing well or get worse.   This information is not intended to replace advice given to you by your health care provider. Make sure you discuss any questions you have with your health care provider.   Document Released: 04/23/2013 Document Revised: 07/24/2014 Document Reviewed: 04/23/2013 Elsevier Interactive Patient Education [employer].

## 2015-10-18 NOTE — Anesthesia Preprocedure Evaluation (Addendum)
Anesthesia Evaluation  Patient identified by MRN, date of birth, ID band Patient awake    Reviewed: Allergy & Precautions, NPO status , Patient's Chart, lab work & pertinent test results  History of Anesthesia Complications Negative for: history of anesthetic complications  Airway Mallampati: III  TM Distance: >3 FB Neck ROM: Full    Dental  (+) Edentulous Upper, Edentulous Lower   Pulmonary shortness of breath, COPD, Current Smoker,     + wheezing      Cardiovascular hypertension, Pt. on medications (-) angina+ CAD and + Peripheral Vascular Disease  (-) Past MI  Rhythm:Regular     Neuro/Psych negative neurological ROS  negative psych ROS   GI/Hepatic negative GI ROS, (+)     substance abuse  alcohol use,   Endo/Other  negative endocrine ROS  Renal/GU negative Renal ROS     Musculoskeletal  (+) Arthritis ,   Abdominal   Peds  Hematology negative hematology ROS (+)   Anesthesia Other Findings   Reproductive/Obstetrics                            Anesthesia Physical Anesthesia Plan  ASA: III  Anesthesia Plan: MAC   Post-op Pain Management:    Induction: Intravenous  Airway Management Planned: Nasal Cannula  Additional Equipment: None  Intra-op Plan:   Post-operative Plan:   Informed Consent: I have reviewed the patients History and Physical, chart, labs and discussed the procedure including the risks, benefits and alternatives for the proposed anesthesia with the patient or authorized representative who has indicated his/her understanding and acceptance.   Dental advisory given  Plan Discussed with: CRNA and Surgeon  Anesthesia Plan Comments:         Anesthesia Quick Evaluation

## 2015-10-18 NOTE — H&P (View-Only) (Signed)
Subjective:    Patient ID: Brian White, male    DOB: [birth year], 65 y.o.   MRN: [id number]  HPI Patient recently had a chest CT scan for screening. Denies any cough. Denies any wheezing. Reports dyspnea with exertion. No chest pain or pressure. No fever, chills, sweats, or weight loss. No dysphagia or odynophagia. No adenopathy in neck, groin, or axilla. No rashes or bruising. Had a colonoscopy yesterday with removal of 6 polyps by Dr. Havery Moros. Does have a h/o prostate cancer & XRT. On Symbicort for "possible" COPD but has never had breathing tests.   Review of Systems No dysuria or hematuria. No headaches. No focal vision loss, weakness, or tingling. A pertinent 14 point review of systems is negative except as per the history of presenting illness.  Allergies  Allergen Reactions  . Onion Anaphylaxis    Hives     Current Outpatient Prescriptions on File Prior to Visit  Medication Sig Dispense Refill  . aspirin 81 MG tablet Take 81 mg by mouth daily.      . budesonide-formoterol (SYMBICORT) 160-4.5 MCG/ACT inhaler Inhale 1 puff into the lungs 2 (two) times daily. 1 Inhaler 3  . cetirizine (ZYRTEC) 10 MG chewable tablet Chew 10 mg by mouth daily.    Marland Kitchen EPINEPHrine (EPIPEN) 0.3 mg/0.3 mL DEVI Inject 0.3 mLs (0.3 mg total) into the muscle once. 1 Device prn  . ibuprofen (ADVIL,MOTRIN) 200 MG tablet Take 600 mg by mouth every 6 (six) hours as needed.    Marland Kitchen losartan-hydrochlorothiazide (HYZAAR) 100-25 MG tablet Take 1 tablet by mouth daily. 90 tablet 3  . sildenafil (VIAGRA) 100 MG tablet Take 0.5-1 tablets (50-100 mg total) by mouth daily as needed for erectile dysfunction. 4 tablet 0   No current facility-administered medications on file prior to visit.    Past Medical History  Diagnosis Date  . Hypertension   . Arthritis   . CAD (coronary artery disease)     mild MI; pt unaware - seen on stress test  . Prostate cancer [facility]) 2013    s/p radiation therapy March 2013, planned f/u  with uro after XRT  . H/O seasonal allergies   . H/O anaphylactic shock 2013    hives,hypotension,syncope  . Smoker   . ED (erectile dysfunction)   . Polycythemia 01/25/2014  . Allergy   . Pulmonary nodule, left 10/08/2015    2.4cm spiculated LLL nodule suspicious for primary bronchogenic carcinoma by CT 09/2015   . Thoracic aorta atherosclerosis ([place]) 10/08/2015    Mild by CT 09/2015   . COPD (chronic obstructive pulmonary disease) ([place]) 06/26/2011    Mild centrilobular and paraseptal emphysema by CT 09/2015     Past Surgical History  Procedure Laterality Date  . Hernia repair Right 2005    with mesh  . Insertion prostate radiation seed  08/2011    prostate cancer, [facility]  . Colonoscopy w/ biopsies and polypectomy  10/11/15    Family History  Problem Relation Age of Onset  . Asthma Father   . COPD Father   . CAD Father     possibly  . Emphysema Father   . Breast cancer Sister   . Hypertension Mother   . Diabetes Neg Hx   . Stroke Neg Hx   . Colon cancer Neg Hx   . Colon polyps Neg Hx   . Rectal cancer Neg Hx   . Stomach cancer Neg Hx     Social History   Social History  . Marital  Status: Married    Spouse Name: N/A  . Number of Children: Y  . Years of Education: N/A   Occupational History  . retired    Social History Main Topics  . Smoking status: Current Every Day Smoker -- 1.00 packs/day for 49 years    Types: Cigarettes    Start date: 01/16/1967  . Smokeless tobacco: Never Used     Comment: Peak rate of 2ppd  . Alcohol Use: 0.6 oz/week    1 Shots of liquor per week     Comment: 2-3 beers or mixed drinks daily    . Drug Use: Yes    Special: Marijuana     Comment: marijuana occasional weekly  . Sexual Activity: Yes    Birth Control/ Protection: None   Other Topics Concern  . None   Social History Narrative   Lives with wife, dogs, chickens and goats   Occupation: retired.  On disability for herinated discs in back   Edu: HS   Activity: works  outside [employer], takes care of farm animals   Diet: good water, fruits/vegetables daily      [facility]:   Originally from [place]. 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 No known asbestos exposure. Questionable mold exposure. Has 2 dogs currently. No recent bird exposure. Previously had chickens.       Objective:   Physical Exam BP 142/78 mmHg  Pulse 57  Ht '5\' 11"'$  (1.803 m)  Wt 210 lb 9.6 oz (95.528 kg)  BMI 29.39 kg/m2  SpO2 96% General:  Awake. Alert. No acute distress. ASA Class I.  Integument:  Warm & dry. No rash on exposed skin.  Lymphatics:  No appreciated cervical or supraclavicular lymphadenoapthy. HEENT:  Moist mucus membranes. No oral ulcers. No scleral injection or icterus. Mallampati Class III. Cardiovascular:  Regular rate. No edema. No appreciable JVD.  Pulmonary:  Good aeration & clear to auscultation bilaterally. Symmetric chest wall expansion. No accessory muscle use. Abdomen: Soft. Normal bowel sounds. Protuberant. Grossly nontender. Musculoskeletal:  Normal bulk and tone. Hand grip strength 5/5 bilaterally. No joint deformity or effusion appreciated. Neurological:  CN 2-12 grossly in tact. No meningismus. Moving all 4 extremities equally. Symmetric brachioradialis deep tendon reflexes. Psychiatric:  Mood and affect congruent. Speech normal rhythm, rate & tone.   IMAGING LD CT CHEST W/O 10/04/15 (personally reviewed by me): No pleural effusion or thickening. No pericardial effusion. 2.4x2.7 cm nodule in LLL on my measurements with precarinal lymph adenopathy that appears to be greater than 1cm in short axis.  LABS 09/13/15 CBC: 6.9/17.9/52.1/142    Assessment & Plan:  64 year old male with ongoing tobacco use and incidentally found left lower lobe lung nodule and pathologic mediastinal adenopathy on lung cancer screening CT. Given his ongoing and history of tobacco use the potential  for malignancy is certainly high. I spent a significant amount time today discussing my concern that this represents malignancy with spread to at least ipsilateral mediastinal lymph nodes. We also discussed the need for appropriate diagnosis of biopsy and staging. To that end, I discussed bronchoscopy with endobronchial ultrasound-guided fine-needle aspiration of his mediastinal lymph nodes. We discussed the risks of the procedure including medication allergy, bleeding, pneumothorax, vocal cord injury, infection, and potentially death. The patient is willing to undergo the procedure if necessary. This will depend upon the results of his PET/CT scan. Overall he seems to be stable with regards to his underlying  presumed COPD. I instructed him to contact my office if he had any further questions or concerns.  1. Left lower lobe lung nodule with mediastinal adenopathy: PET CT scan already scheduled for tomorrow. Plan for bronchoscopy with endobronchial ultrasound guided fine-needle aspiration either next Monday with the following Monday at [facility]. Plan for MAC given his long history of alcohol use and potential need for high levels of sedatives. I would prefer to avoid intubation given the need for high level lymph node sampling within mediastinum. 2. Probable COPD: Patient scheduled for pulmonary function testing tomorrow. Continuing on Symbicort. 3. Ongoing tobacco use: Plan to readdress at follow-up appointment. 4. Follow-up: Patient to return to clinic in 2-4 weeks.  Sonia Baller Ashok Cordia, M.D. [facility] Pulmonary & Critical Care Pager:  [phone] After 3pm or if no response, call [phone] 4:59 PM 10/12/2015

## 2015-10-18 NOTE — Transfer of Care (Signed)
Immediate Anesthesia Transfer of Care Note  Patient: Brian White  Procedure(s) Performed: Procedure(s): ENDOBRONCHIAL ULTRASOUND (Bilateral)  Patient Location: PACU  Anesthesia Type:MAC  Level of Consciousness:  sedated, patient cooperative and responds to stimulation  Airway & Oxygen Therapy:Patient Spontanous Breathing and Patient connected to face mask oxgen  Post-op Assessment:  Report given to PACU RN and Post -op Vital signs reviewed and stable  Post vital signs:  Reviewed and stable  Last Vitals:  Filed Vitals:   10/18/15 0626 10/18/15 0856  BP: 165/95 137/68  Pulse: 61 73  Temp: 37.1 C 36.6 C  Resp: 12 20    Complications: No apparent anesthesia complications

## 2015-10-18 NOTE — Interval H&P Note (Signed)
Interval Note: History again reviewed with the patient. There have been no changes to his history of physical exam since the first time I saw him and note was completed. Patient consenting to bronchoscopy and biopsy. Plan to proceed.  Sonia Baller Ashok Cordia, M.D. [facility] Pulmonary & Critical Care Pager:  [phone] After 3pm or if no response, call [phone] 6:48 AM 10/18/2015

## 2015-10-18 NOTE — Anesthesia Postprocedure Evaluation (Signed)
Anesthesia Post Note  Patient: Brian White  Procedure(s) Performed: Procedure(s) (LRB): ENDOBRONCHIAL ULTRASOUND (Bilateral)  Patient location during evaluation: Endoscopy Anesthesia Type: MAC Level of consciousness: awake Pain management: pain level controlled Vital Signs Assessment: post-procedure vital signs reviewed and stable Respiratory status: spontaneous breathing and patient connected to nasal cannula oxygen Cardiovascular status: stable Postop Assessment: no signs of nausea or vomiting Anesthetic complications: no    Last Vitals:  Filed Vitals:   10/18/15 0950 10/18/15 1000  BP: 125/74 140/66  Pulse: 56 61  Temp:    Resp: 13 15    Last Pain: There were no vitals filed for this visit.               Ishi Danser

## 2015-10-18 NOTE — Op Note (Signed)
Video Bronchoscopy Procedure Note  Pre-Procedure Diagnoses: 1.  Left Lower Lobe Lung Nodule  Procedures Performed: 1. Bronchoscopy with Airway Inspection 2. Endobronchial Ultrasound with Needle Aspiration  Consent:  Informed consent was obtained from the patient after discussing the risks and benefits of the procedure including bleeding, infection, pneumothorax, medication allergy, vocal chord injury, and potentially death.  Conscious Sedation:  Administered by Anesthesia. Please refer to documentation.  Medications Administered During Conscious Sedation: 1. Lidocaine 1% gargle 10cc 2. Lidocaine 1% 14cc via bronchoscope  Pre-Procedure Physical Exam: General:  No acute distress. Awake. Alert. ASA Class 1. HEENT:  Moist mucus membranes. No oral ulcers. Mallampati Class 3. Edentulous. Cardiovascular:  Regular rate. No edema. No appreciable JVD. Pulmonary:  Clear to auscultation with good aeration bilaterally. Normal work of breathing. Abdomen:  Soft. Nontender. Nondistended. Normal bowel sounds. Musculoskeletal:  Normal bulk and tone. Normal neck flexion & extension. Neurological:  Oriented to person, place, and time. Moving all 4 extremities equally.  Description of Procedure: Patient was brought back to the endoscopy procedure room.  A time out was performed to identify the correct patient and procedure.  Lidocaine gargle was performed.  Patient was laid recumbent and conscious sedation was administered by anesthetist.  Bite block was inserted and towel was placed over the patient's eyes.  Flexible bronchoscope was then inserted into the posterior pharynx until vocal chords were in full view. There was no abnormality of the vocal chords or arytenoids.  A total of 6cc of Lidocaine was used to anesthetize the vocal chords.  The bronchoscope was then inserted between the vocal chords with ease into the proximal trachea.  Lidocaine was then used to anesthetize the patient's proximal airways.   An airway inspeciton was performed finding copious amounts of thin, clear secretions. Patient did have obstruction due to narrowing of the airway leading to the anterior segment of the left lower lobe. There was some erythematous and nodular mucosa in the airways leading to the right upper lobe. The flexible bronchoscope was then removed from the patient's airways after suctioning of the remaining secretions.  The endobronchial ultrasound scope was then inserted into the posterior pharynx and between the vocal chords with ease.  An enlarged lymph node at level 4R was identified with endobronchial ultrasound.  Under direct visualization with the ultrasound a total of 4 passes were performed with a 22-gauge needle at level 4R.  Hemostasis was directly visualized.  The remaining secretions were suctioned from the patient's airways and the endobronchial ultrasound scope was removed.  The bite block was removed and the patient was returned to the upright position.  Blood Loss:  Less than 10cc.  Complications:  None.  EBUS Procedure: Level 4R:  >2 cm lymph node / biopsy performed / ROSE / passes 1 & 2 for cytology & cell block / passes 3 & 4 for cell block only Level 7:  No lymph node visualized.  Post Procedure Stat Portable CXR:  Ordered and pending.  Post Procedure Instructions: Personally spoke with the patient's wife relaying the preliminary results of the procedure.  Instructed her that the patient is not to operate a car for 24 hours.  The patient should seek immediate medical attention if there is any persistent or progressive hemoptysis, difficulty breathing, or chest pain/discomfort.  The patient should also notify me immediately or seek medical attention for any purulent sputum production or fever occuring today or in the coming days.  The patient will be contacted by me once the final results  of the studies are available.  The patient should call my office if they have any questions.

## 2015-10-19 ENCOUNTER — Telehealth: Payer: Self-pay | Admitting: Radiation Oncology

## 2015-10-19 ENCOUNTER — Telehealth: Payer: Self-pay | Admitting: *Deleted

## 2015-10-19 DIAGNOSIS — R911 Solitary pulmonary nodule: Secondary | ICD-10-CM

## 2015-10-19 DIAGNOSIS — IMO0001 Reserved for inherently not codable concepts without codable children: Secondary | ICD-10-CM

## 2015-10-19 NOTE — Telephone Encounter (Signed)
Left message for patient to call back to set up appointment to see Dr Sondra Come

## 2015-10-19 NOTE — Telephone Encounter (Signed)
-----   Message from Adalberto Ill, RN sent at 10/19/2015 11:44 AM EDT -----   ----- Message -----    From: Javier Glazier, MD    Sent: 10/18/2015   9:23 AM      To: Inge Rise, CMA  Bronchoscopy complete. Patient needs a referral to Medical Oncology & Radiation Oncology. Also needs MRI of the brain with contrast. Not sure when his last BUN/Creatinine was but he will probable need this prior to MRI as well. Thanks.

## 2015-10-19 NOTE — Telephone Encounter (Signed)
Per Dr. Ashok Cordia, he spoke with pt regarding below tests and referrals being ordered.    All orders placed.  lmomtcb for pt to advise he will be receiving call regarding appt dates and times and will need labs prior to MRI.

## 2015-10-20 ENCOUNTER — Telehealth: Payer: Self-pay | Admitting: Pulmonary Disease

## 2015-10-20 ENCOUNTER — Encounter (HOSPITAL_COMMUNITY): Payer: Self-pay | Admitting: Pulmonary Disease

## 2015-10-20 NOTE — Telephone Encounter (Signed)
LVM for pt to return call

## 2015-10-20 NOTE — Telephone Encounter (Signed)
Spoke with pt.  Discussed below.  He is aware of JN's recs and aware he will receive call regarding each appt date, time, and location.  Pt verbalized understanding, is in agreement with plan, and voiced no further questions or concerns at this time.

## 2015-10-20 NOTE — Telephone Encounter (Signed)
Pt returned call. He states he has a MRI on 10/29/15 and would like something to be called in for claustrophobia. He feels he will not be able to go through with the MRI without something to help his nerves. I verified pharmacy as [facility] in [place]. I explained to him that I would need to send a message to [facility] for approval. He voiced understanding and had no further questions.   JN please advise

## 2015-10-21 NOTE — Telephone Encounter (Signed)
I attempted to contact the patient and ultimately was able to reach his wife. I relayed the results of his biopsy which shows adenocarcinoma lung primary. He has an appointment for a brain MRI as well as an appointment with radiation oncology. I do not see an appointment with medical oncology scheduled yet. We will look into this. We will provide the patient with a prescription for Ativan 1 mg by mouth 30 minutes before the MRI with an additional 1 mg if needed during the MRI by mouth.

## 2015-10-22 ENCOUNTER — Telehealth: Payer: Self-pay | Admitting: *Deleted

## 2015-10-22 ENCOUNTER — Encounter: Payer: Self-pay | Admitting: Radiation Oncology

## 2015-10-22 DIAGNOSIS — IMO0001 Reserved for inherently not codable concepts without codable children: Secondary | ICD-10-CM

## 2015-10-22 DIAGNOSIS — R911 Solitary pulmonary nodule: Secondary | ICD-10-CM

## 2015-10-22 MED ORDER — LORAZEPAM 1 MG PO TABS: ORAL_TABLET | ORAL | Status: DC

## 2015-10-22 NOTE — Telephone Encounter (Signed)
Rx called into pharmacy for Ativan prior to MRI. Nothing further needed.

## 2015-10-22 NOTE — Telephone Encounter (Signed)
Oncology Nurse Navigator Documentation  Oncology Nurse Navigator Flowsheets 10/22/2015  Navigator Location [facility]  Navigator Encounter Type Introductory phone call  Treatment Phase Pre-Tx/Tx Discussion  Barriers/Navigation Needs Coordination of Care  Interventions Coordination of Care  Coordination of Care Appts  Acuity Level 1  Acuity Level 1 Initial guidance, education and coordination as needed  Time Spent with Patient 15   I received referral on Mr. Stegemann.  I called and scheduled him to see Med Onc on 11/04/15 arrive at 3:30.  He verbalized understanding of appt time and place.

## 2015-10-22 NOTE — Progress Notes (Signed)
Thoracic Location of Tumor / Histology: left lower lobe lung nodule and pathologic mediastinal adenopathy  Patient presented for lung cancer screening and had a CT of his chest on 10/04/15 which showed "2.4 cm spiculated nodule in the medial left lower lobe."  Biopsies revealed:   10/18/15 Diagnosis FINE NEEDLE ASPIRATION, ENDOSCOPIC, EBUS, 4R NODE (SPECIMEN 1 OF 1 COLLECTED 10/18/15): MALIGNANT CELLS CONSISTENT WITH METASTATIC NON-SMALL CELL CARCINOMA.  Tobacco/Marijuana/Snuff/ETOH use: currently smokes 1 ppd or less, has smoked for 49 years, drinks 2-3 beers per day  Past/Anticipated interventions by cardiothoracic surgery, if any: 10/18/15 - Procedure: ENDOBRONCHIAL ULTRASOUND;  Surgeon: Javier Glazier, MD;  Location: [facility];  Service: Cardiopulmonary;  Laterality: Bilateral;  Past/Anticipated interventions by medical oncology, if any: Apt with Dr. Julien Nordmann on 11/04/15.  Signs/Symptoms  Weight changes, if any: no  Respiratory complaints, if any: yes - occasional shortness of breath with activity or heat.  He reports a frequent cough with clear/beige sputum.  Hemoptysis, if any: no  Pain issues, if any:  no  SAFETY ISSUES:  Prior radiation? Yes 09/18/2011 through 11/10/2011-  Prostate 7600 cGy 40 sessions, seminal vesicles 5600 cGy 40 sessions by Dr. Valere Dross   Pacemaker/ICD? no   Possible current pregnancy?no  Is the patient on methotrexate? no  Current Complaints / other details:  Patient is here with his wife.  BP 131/74 mmHg  Pulse 67  Temp(Src) 98.6 F (37 C) (Oral)  Ht '5\' 11"'$  (1.803 m)  Wt 214 lb 4.8 oz (97.206 kg)  BMI 29.90 kg/m2  SpO2 99%

## 2015-10-23 ENCOUNTER — Encounter (HOSPITAL_COMMUNITY): Payer: Self-pay | Admitting: Family Medicine

## 2015-10-25 ENCOUNTER — Other Ambulatory Visit (INDEPENDENT_AMBULATORY_CARE_PROVIDER_SITE_OTHER): Payer: BLUE CROSS/BLUE SHIELD

## 2015-10-25 DIAGNOSIS — R911 Solitary pulmonary nodule: Secondary | ICD-10-CM

## 2015-10-25 DIAGNOSIS — IMO0001 Reserved for inherently not codable concepts without codable children: Secondary | ICD-10-CM

## 2015-10-25 LAB — CREATININE, SERUM: Creatinine, Ser: 0.85 mg/dL (ref 0.40–1.50)

## 2015-10-25 LAB — BUN: BUN: 14 mg/dL (ref 6–23)

## 2015-10-27 ENCOUNTER — Encounter: Payer: Self-pay | Admitting: Family Medicine

## 2015-10-28 ENCOUNTER — Ambulatory Visit
Admission: RE | Admit: 2015-10-28 | Discharge: 2015-10-28 | Disposition: A | Discharge status: Home / Self Care | Payer: BLUE CROSS/BLUE SHIELD | Source: Ambulatory Visit | Attending: Radiation Oncology | Admitting: Radiation Oncology

## 2015-10-28 ENCOUNTER — Encounter: Payer: Self-pay | Admitting: Radiation Oncology

## 2015-10-28 VITALS — BP 131/74 | HR 67 | Temp 98.6°F | Ht 71.0 in | Wt 214.3 lb

## 2015-10-28 DIAGNOSIS — D751 Secondary polycythemia: Secondary | ICD-10-CM | POA: Insufficient documentation

## 2015-10-28 DIAGNOSIS — Z510 Encounter for antineoplastic radiation therapy: Secondary | ICD-10-CM | POA: Insufficient documentation

## 2015-10-28 DIAGNOSIS — I700 Atherosclerosis of aorta: Secondary | ICD-10-CM | POA: Diagnosis not present

## 2015-10-28 DIAGNOSIS — N529 Male erectile dysfunction, unspecified: Secondary | ICD-10-CM | POA: Insufficient documentation

## 2015-10-28 DIAGNOSIS — F17210 Nicotine dependence, cigarettes, uncomplicated: Secondary | ICD-10-CM | POA: Insufficient documentation

## 2015-10-28 DIAGNOSIS — I10 Essential (primary) hypertension: Secondary | ICD-10-CM | POA: Insufficient documentation

## 2015-10-28 DIAGNOSIS — J449 Chronic obstructive pulmonary disease, unspecified: Secondary | ICD-10-CM | POA: Insufficient documentation

## 2015-10-28 DIAGNOSIS — M1990 Unspecified osteoarthritis, unspecified site: Secondary | ICD-10-CM | POA: Insufficient documentation

## 2015-10-28 DIAGNOSIS — C3432 Malignant neoplasm of lower lobe, left bronchus or lung: Secondary | ICD-10-CM | POA: Insufficient documentation

## 2015-10-28 DIAGNOSIS — I2510 Atherosclerotic heart disease of native coronary artery without angina pectoris: Secondary | ICD-10-CM | POA: Diagnosis not present

## 2015-10-28 HISTORY — DX: Reserved for inherently not codable concepts without codable children: IMO0001

## 2015-10-28 HISTORY — DX: Reserved for concepts with insufficient information to code with codable children: IMO0002

## 2015-10-28 NOTE — Progress Notes (Signed)
Radiation Oncology         [phone] ________________________________  Initial Outpatient Consultation  Name: Brian White MRN: [id number]  Date: 10/28/2015  DOB: 1951-01-15  ZD:GUYQIH Brian Mina, MD  Carolan Clines, MD   REFERRING PHYSICIAN: Blanchard Mane, MD  DIAGNOSIS: Clinical T1b,N3,Mx (Stage III-B) Adenocarcinoma of the Left Lower Lobe (brain MRI pending)  HISTORY OF PRESENT ILLNESS::Brian White is a 65 y.o. male who has a past history of prostate cancer treated by Dr. Valere Dross. On 10/04/15, he presented to Eric Form, NP to discuss lung cancer screening. CT scan of the chest the same day revealed a 2.4 cm spiculated nodule in the medial left lower lobe and associated mediastinal lymphadenopathy. PET scan on 10/13/15 revealed the 2.4 cm left lower lobe nodule is hypermetabolic with SUV max 47.4 and hypermetabolic left hilar, mediastinal, and right supraclavicular lymphadenopathy. The patient underwent bronchoscopy and biopsy on 10/18/15. Fine needle aspiration revealed malignant cells consistent with metastatic non-small cell carcinoma. The tumor cells are positive for TTF-1 and negative for p63. Along with morphology, the TTF-1 positivity favors adenocarcnioma. The patient presents today to discuss radiation for the management of his disease.  His last PSA was 1.66 on 08/27/15.  PREVIOUS RADIATION THERAPY: Yes. Stage TIc favorable risk adenocarcinoma prostate  TREATMENT DATES: 09/18/2011 through 11/10/2011: Prostate 7600 cGy 40 sessions, seminal vesicles 5600 cGy 40 sessions  PAST MEDICAL HISTORY:  has a past medical history of Hypertension; Arthritis; CAD (coronary artery disease); Prostate cancer ([place]) (2013); H/O seasonal allergies; H/O anaphylactic shock (2013); Smoker; ED (erectile dysfunction); Polycythemia (01/25/2014); Allergy; Adenocarcinoma, lung ([place]) (10/08/2015); Thoracic aorta atherosclerosis ([place]) (10/08/2015); COPD (chronic obstructive pulmonary disease) ([place])  (06/26/2011); and Radiation (09/18/2011 through 11/10/2011).    PAST SURGICAL HISTORY: Past Surgical History  Procedure Laterality Date  . Hernia repair Right 2005    with mesh  . Insertion prostate radiation seed  08/2011    prostate cancer, [place]  . Colonoscopy w/ biopsies and polypectomy  10/11/15    mult TAs, 3cm rectal polypoid lesion; rec further treatment, diverticulosis ([place])  . Endobronchial ultrasound Bilateral 10/18/2015    Procedure: ENDOBRONCHIAL ULTRASOUND;  Surgeon: Javier Glazier, MD;  Location: [facility];  Service: Cardiopulmonary;  Laterality: Bilateral;    FAMILY HISTORY: family history includes Asthma in his father; Breast cancer in his sister; CAD in his father; COPD in his father; Emphysema in his father; Hypertension in his mother. There is no history of Diabetes, Stroke, Colon cancer, Colon polyps, Rectal cancer, or Stomach cancer.  SOCIAL HISTORY:  reports that he has been smoking Cigarettes.  He started smoking about 48 years ago. He has a 49 pack-year smoking history. He has never used smokeless tobacco. He reports that he drinks about 0.6 oz of alcohol per week. He reports that he uses illicit drugs (Marijuana).  ALLERGIES: Onion  MEDICATIONS:  Current Outpatient Prescriptions  Medication Sig Dispense Refill  . aspirin 81 MG tablet Take 81 mg by mouth daily.      . budesonide-formoterol (SYMBICORT) 160-4.5 MCG/ACT inhaler Inhale 1 puff into the lungs 2 (two) times daily. 1 Inhaler 3  . cetirizine (ZYRTEC) 10 MG chewable tablet Chew 10 mg by mouth daily.    Marland Kitchen ibuprofen (ADVIL,MOTRIN) 200 MG tablet Take 600 mg by mouth every 6 (six) hours as needed (Pain).     . LORazepam (ATIVAN) 1 MG tablet Take 1 tablet 30 minutes before MRI, additional '1mg'$  during if needed 2 tablet 0  . losartan-hydrochlorothiazide (HYZAAR) 100-25 MG tablet Take 1  tablet by mouth daily. 90 tablet 3  . sildenafil (VIAGRA) 100 MG tablet Take 0.5-1 tablets (50-100 mg total) by  mouth daily as needed for erectile dysfunction. 4 tablet 0  . EPINEPHrine (EPIPEN) 0.3 mg/0.3 mL DEVI Inject 0.3 mLs (0.3 mg total) into the muscle once. (Patient not taking: Reported on 10/28/2015) 1 Device prn   No current facility-administered medications for this encounter.    REVIEW OF SYSTEMS:  A 15 point review of systems is documented in the electronic medical record. This was obtained by the nursing staff. However, I reviewed this with the patient to discuss relevant findings and make appropriate changes.  Pertinent items noted in HPI and remainder of comprehensive ROS otherwise negative.  Denies hemoptysis or shortness of breath. He reports back problems associated with bulging disks. Denies headaches or visual changes. Reports some fatigue, but attributes this to his age. Denies changes in appetite. No chest pain.  PHYSICAL EXAM:  height is '5\' 11"'$  (1.803 m) and weight is 214 lb 4.8 oz (97.206 kg). His oral temperature is 98.6 F (37 C). His blood pressure is 131/74 and his pulse is 67. His oxygen saturation is 99%.    General: Alert and oriented, in no acute distress HEENT: Head is normocephalic. Extraocular movements are intact. Oropharynx is clear. Patient wears dentures. Neck: Neck is supple, no palpable cervical or supraclavicular lymphadenopathy. Heart: Regular in rate and rhythm with no murmurs, rubs, or gallops. Chest: Clear to auscultation bilaterally, with no rhonchi, wheezes, or rales. Abdomen: Soft, nontender, nondistended, with no rigidity or guarding. Extremities: No cyanosis or edema. Lymphatics: see Neck Exam Skin: The patient has some two cysts on his back. Musculoskeletal: symmetric strength and muscle tone throughout. Neurologic: Cranial nerves II through XII are grossly intact. No obvious focalities. Speech is fluent. Coordination is intact. Psychiatric: Judgment and insight are intact. Affect is appropriate.  ECOG = 1  LABORATORY DATA:  Lab Results    Component Value Date   WBC 6.9 09/13/2015   HGB 17.9* 09/13/2015   HCT 52.1* 09/13/2015   MCV 101.1* 09/13/2015   PLT 142.0* 09/13/2015   NEUTROABS 4.0 09/13/2015   Lab Results  Component Value Date   NA 139 08/27/2015   K 4.5 08/27/2015   CL 99 08/27/2015   CO2 35* 08/27/2015   GLUCOSE 105* 08/27/2015   CREATININE 0.85 10/25/2015   CALCIUM 10.0 08/27/2015      RADIOGRAPHY: Nm Pet Image Initial (pi) Skull Base To Thigh  10/13/2015  CLINICAL DATA:  Initial treatment strategy for pulmonary nodule. EXAM: NUCLEAR MEDICINE PET SKULL BASE TO THIGH TECHNIQUE: 10.2 mCi F-18 FDG was injected intravenously. Full-ring PET imaging was performed from the skull base to thigh after the radiotracer. CT data was obtained and used for attenuation correction and anatomic localization. FASTING BLOOD GLUCOSE:  Value: 112 mg/dl COMPARISON:  Lung cancer screening study from 10/04/2015. FINDINGS: NECK Right hypermetabolic supraclavicular lymphadenopathy identified with SUV max = 8.5. CHEST 2.4 cm spiculated left lower lobe nodule identified on recent Lung cancer screening is hypermetabolic with SUV max = 93.8. This is associated with bulky mediastinal lymphadenopathy. The 2.8 cm short axis right paratracheal lymph node demonstrates SUV max = 20.2. Hypermetabolic left hilar lymph node demonstrates SUV max = 16.1. The heart is enlarged. ABDOMEN/PELVIS No abnormal hypermetabolic activity within the liver, pancreas, adrenal glands, or spleen. No hypermetabolic lymph nodes in the abdomen or pelvis. There is abdominal aortic atherosclerosis without aneurysm. SKELETON No focal hypermetabolic activity to suggest skeletal metastasis.  IMPRESSION: 2.4 cm left lower lobe pulmonary nodule is markedly hypermetabolic, consistent with primary bronchogenic neoplasm. Hypermetabolic left hilar, mediastinal, and right supraclavicular lymphadenopathy is compatible with metastatic disease. Electronically Signed   By: Misty Stanley M.D.    On: 10/13/2015 15:39   Dg Chest Port 1 View  10/18/2015  CLINICAL DATA:  Post mediastinal lymph node biopsy. EXAM: PORTABLE CHEST 1 VIEW COMPARISON:  10/13/2015 PET CT FINDINGS: Fullness of the right paratracheal region compatible with adenopathy seen on prior CTs. Mild cardiomegaly. No confluent airspace opacities or effusions. No pneumothorax. IMPRESSION: Right paratracheal adenopathy.  No pneumothorax. Electronically Signed   By: Rolm Baptise M.D.   On: 10/18/2015 09:30   Ct Chest Lung Ca Screen Low Dose W/o Cm  10/04/2015  CLINICAL DATA:  65 year old male current smoker with 49 pack-year history of smoking, for initial lung cancer screening EXAM: CT CHEST WITHOUT CONTRAST LOW-DOSE FOR LUNG CANCER SCREENING TECHNIQUE: Multidetector CT imaging of the chest was performed following the standard protocol without IV contrast. COMPARISON:  None. FINDINGS: Mediastinum/Nodes: The heart is normal in size. No pericardial effusion. Coronary atherosclerosis in the LAD and right coronary artery. Mild atherosclerotic calcifications of thoracic aorta. Current mediastinal lymphadenopathy, including a 2.8 cm short axis right paratracheal node (series 2/ image 21) and a 1.7 cm short axis AP window node (series 2/ image 24), suspicious. Additional calcified AP window and left perihilar nodes, possibly reactive. Lungs/Pleura: 2.4 cm (volumetric mean) nodule in the medial left lower lobe (series 3/image 207; coronal image 96), mildly spiculated and likely occluding a branch of the right lower lobe bronchus (series 3/ image 194), highly suspicious for primary bronchogenic neoplasm. Additional mild postobstructive opacity/nodularity is possible in the medial left lower lobe (series 3/ image 232), although this region is somewhat poorly evaluated due to motion degradation. Lungs are otherwise clear. Mild centrilobular and paraseptal emphysematous changes. No focal consolidation. No pleural effusion or pneumothorax. Upper abdomen:  Visualized upper abdomen is unremarkable. Musculoskeletal: Degenerative changes of the visualized thoracolumbar spine. IMPRESSION: 2.4 cm spiculated nodule in the medial left lower lobe, highly suspicious for primary bronchogenic neoplasm. Lung-RADS Category 4B, suspicious. Associated mediastinal lymphadenopathy measuring up to 2.8 cm short axis, as above, suspicious for nodal metastases. Discussion at multidisciplinary tumor board is suggested. Consider bronchoscopy or percutaneous sampling for tissue confirmation. PET-CT may be beneficial for staging. Electronically Signed   By: Julian Hy M.D.   On: 10/04/2015 12:41      IMPRESSION: Clinical T1bN3,Mx (Stage IIIB) Adenocarcinoma of the Left Lower Lobe.  Patient would be a good candidate for a definitive course of radiation therapy along with radiosensitizing chemotherapy. The patient's treatment field will be rather long given the necessity of covering the left lower lobe lesion, left hilar, mediastinal and right supraclavicular region. May need to consider intensity modulated radiation therapy to cover these areas and to limit dose to normal surrounding lung.   PLAN: We discussed his diagnosis and stage. We discussed definitive chemoradiation in the management of his disease. We discussed 6 weeks of treatment as an outpatient. We discussed the process of CT simulation and the placement of tattoos. We discussed dysphagia, weight loss, sore throat and fatigue as the acute side effects of radiation. We discussed damage to critical normal structures in the chest as well as the spinal cord as possible but improbably. We will schedule him for CT simulation on April 18 at 1PM and begin treatments as soon as possible. The patient has a brain MRI scheduled tomorrow  and a consultation with Dr. Julien Nordmann on 11/04/15. Treatments likely to start the week of April 24.     ------------------------------------------------  Blair Promise, PhD, MD  This document  serves as a record of services personally performed by Gery Pray, MD. It was created on his behalf by Darcus Austin, a trained medical scribe. The creation of this record is based on the scribe's personal observations and the provider's statements to them. This document has been checked and approved by the attending provider.

## 2015-10-29 ENCOUNTER — Ambulatory Visit (HOSPITAL_COMMUNITY)
Admission: RE | Admit: 2015-10-29 | Discharge: 2015-10-29 | Disposition: A | Discharge status: Home / Self Care | Payer: BLUE CROSS/BLUE SHIELD | Source: Ambulatory Visit | Attending: Pulmonary Disease | Admitting: Pulmonary Disease

## 2015-10-29 DIAGNOSIS — R911 Solitary pulmonary nodule: Secondary | ICD-10-CM | POA: Diagnosis not present

## 2015-10-29 DIAGNOSIS — R9082 White matter disease, unspecified: Secondary | ICD-10-CM | POA: Insufficient documentation

## 2015-10-29 DIAGNOSIS — G319 Degenerative disease of nervous system, unspecified: Secondary | ICD-10-CM | POA: Insufficient documentation

## 2015-10-29 DIAGNOSIS — IMO0001 Reserved for inherently not codable concepts without codable children: Secondary | ICD-10-CM

## 2015-10-29 DIAGNOSIS — C3490 Malignant neoplasm of unspecified part of unspecified bronchus or lung: Secondary | ICD-10-CM | POA: Diagnosis not present

## 2015-10-29 MED ORDER — GADOBENATE DIMEGLUMINE 529 MG/ML IV SOLN
20.0000 mL | Freq: Once | INTRAVENOUS | Status: AC | PRN
  Administered 2015-10-29: 20 mL via INTRAVENOUS

## 2015-11-02 ENCOUNTER — Ambulatory Visit
Admission: RE | Admit: 2015-11-02 | Discharge: 2015-11-02 | Disposition: A | Discharge status: Home / Self Care | Payer: BLUE CROSS/BLUE SHIELD | Source: Ambulatory Visit | Attending: Radiation Oncology | Admitting: Radiation Oncology

## 2015-11-02 DIAGNOSIS — Z510 Encounter for antineoplastic radiation therapy: Secondary | ICD-10-CM | POA: Diagnosis not present

## 2015-11-02 DIAGNOSIS — C3432 Malignant neoplasm of lower lobe, left bronchus or lung: Secondary | ICD-10-CM | POA: Diagnosis not present

## 2015-11-03 ENCOUNTER — Telehealth: Payer: Self-pay | Admitting: *Deleted

## 2015-11-03 NOTE — Progress Notes (Signed)
Quick Note:  Pt informed of results. Pt voiced understanding. Nothing further needed. ______

## 2015-11-03 NOTE — Telephone Encounter (Signed)
Called and confirmed 11/04/15 clinic appt w/ pt.

## 2015-11-04 ENCOUNTER — Other Ambulatory Visit (HOSPITAL_BASED_OUTPATIENT_CLINIC_OR_DEPARTMENT_OTHER): Payer: BLUE CROSS/BLUE SHIELD

## 2015-11-04 ENCOUNTER — Ambulatory Visit (HOSPITAL_BASED_OUTPATIENT_CLINIC_OR_DEPARTMENT_OTHER): Payer: BLUE CROSS/BLUE SHIELD | Admitting: Internal Medicine

## 2015-11-04 ENCOUNTER — Ambulatory Visit: Payer: BLUE CROSS/BLUE SHIELD | Attending: Internal Medicine | Admitting: Physical Therapy

## 2015-11-04 ENCOUNTER — Encounter: Payer: Self-pay | Admitting: Internal Medicine

## 2015-11-04 ENCOUNTER — Encounter: Payer: Self-pay | Admitting: *Deleted

## 2015-11-04 VITALS — BP 149/70 | HR 60 | Temp 98.4°F | Resp 18 | Ht 71.0 in | Wt 216.2 lb

## 2015-11-04 DIAGNOSIS — R29898 Other symptoms and signs involving the musculoskeletal system: Secondary | ICD-10-CM | POA: Insufficient documentation

## 2015-11-04 DIAGNOSIS — C3432 Malignant neoplasm of lower lobe, left bronchus or lung: Secondary | ICD-10-CM

## 2015-11-04 DIAGNOSIS — Z8546 Personal history of malignant neoplasm of prostate: Secondary | ICD-10-CM

## 2015-11-04 DIAGNOSIS — Z803 Family history of malignant neoplasm of breast: Secondary | ICD-10-CM

## 2015-11-04 DIAGNOSIS — M545 Low back pain, unspecified: Secondary | ICD-10-CM

## 2015-11-04 DIAGNOSIS — Z720 Tobacco use: Secondary | ICD-10-CM

## 2015-11-04 DIAGNOSIS — Z87898 Personal history of other specified conditions: Secondary | ICD-10-CM

## 2015-11-04 DIAGNOSIS — D751 Secondary polycythemia: Secondary | ICD-10-CM

## 2015-11-04 DIAGNOSIS — R599 Enlarged lymph nodes, unspecified: Secondary | ICD-10-CM

## 2015-11-04 DIAGNOSIS — IMO0001 Reserved for inherently not codable concepts without codable children: Secondary | ICD-10-CM

## 2015-11-04 DIAGNOSIS — Z8489 Family history of other specified conditions: Secondary | ICD-10-CM

## 2015-11-04 DIAGNOSIS — R911 Solitary pulmonary nodule: Secondary | ICD-10-CM

## 2015-11-04 LAB — CBC WITH DIFFERENTIAL/PLATELET
BASO%: 0.7 % (ref 0.0–2.0)
Basophils Absolute: 0.1 10*3/uL (ref 0.0–0.1)
EOS%: 0.9 % (ref 0.0–7.0)
Eosinophils Absolute: 0.1 10*3/uL (ref 0.0–0.5)
HCT: 52 % — ABNORMAL HIGH (ref 38.4–49.9)
HGB: 17.4 g/dL — ABNORMAL HIGH (ref 13.0–17.1)
LYMPH%: 25.9 % (ref 14.0–49.0)
MCH: 35 pg — ABNORMAL HIGH (ref 27.2–33.4)
MCHC: 33.4 g/dL (ref 32.0–36.0)
MCV: 104.9 fL — ABNORMAL HIGH (ref 79.3–98.0)
MONO#: 0.7 10*3/uL (ref 0.1–0.9)
MONO%: 10.1 % (ref 0.0–14.0)
NEUT#: 4.3 10*3/uL (ref 1.5–6.5)
NEUT%: 62.4 % (ref 39.0–75.0)
Platelets: 148 10*3/uL (ref 140–400)
RBC: 4.96 10*6/uL (ref 4.20–5.82)
RDW: 14.9 % — ABNORMAL HIGH (ref 11.0–14.6)
WBC: 7 10*3/uL (ref 4.0–10.3)
lymph#: 1.8 10*3/uL (ref 0.9–3.3)

## 2015-11-04 LAB — COMPREHENSIVE METABOLIC PANEL
ALT: 13 U/L (ref 0–55)
AST: 20 U/L (ref 5–34)
Albumin: 4 g/dL (ref 3.5–5.0)
Alkaline Phosphatase: 67 U/L (ref 40–150)
Anion Gap: 9 mEq/L (ref 3–11)
BUN: 16.9 mg/dL (ref 7.0–26.0)
CO2: 28 mEq/L (ref 22–29)
Calcium: 10 mg/dL (ref 8.4–10.4)
Chloride: 107 mEq/L (ref 98–109)
Creatinine: 1.3 mg/dL (ref 0.7–1.3)
EGFR: 58 mL/min/{1.73_m2} — ABNORMAL LOW (ref >90)
Glucose: 95 mg/dl (ref 70–140)
Potassium: 5.2 mEq/L — ABNORMAL HIGH (ref 3.5–5.1)
Sodium: 144 mEq/L (ref 136–145)
Total Bilirubin: 0.61 mg/dL (ref 0.20–1.20)
Total Protein: 7.1 g/dL (ref 6.4–8.3)

## 2015-11-04 MED ORDER — PROCHLORPERAZINE MALEATE 10 MG PO TABS: 10.0000 mg | ORAL_TABLET | Freq: Four times a day (QID) | ORAL | Status: DC | PRN

## 2015-11-04 NOTE — Progress Notes (Signed)
[place] Clinical Social Work  Clinical Social Work met with patient/family and Futures trader at [facility] appointment to offer support and assess for psychosocial needs.  Medical oncologist reviewed patient's diagnosis and recommended treatment plan with patient/family.  Brian White was accompanied by his spouse, Brian White.  The patient's main concerns is getting started with treatment and wondering, 'is it going to work?' CSW provided brief emotional support and answered patients questions.  Clinical Social Work briefly discussed Clinical Social Work role and [employer] support programs/services.  Clinical Social Work encouraged patient to call with any additional questions or concerns.   Polo Riley, MSW, LCSW, OSW-C Clinical Social Worker [facility] [phone]

## 2015-11-04 NOTE — Progress Notes (Signed)
  Radiation Oncology         [phone]) [phone] ________________________________  Name: Brian White MRN: [id number]  Date: 11/02/2015  DOB: Dec 07, 1950  SIMULATION AND TREATMENT PLANNING NOTE    ICD-9-CM ICD-10-CM   1. Cancer of lower lobe of left lung (HCC) 162.5 C34.32     DIAGNOSIS: Clinical T1b,N3,Mx (Stage III-B) Adenocarcinoma of the Left Lower Lobe   NARRATIVE:  The patient was brought to the [facility].  Identity was confirmed.  All relevant records and images related to the planned course of therapy were reviewed.  The patient freely provided informed written consent to proceed with treatment after reviewing the details related to the planned course of therapy. The consent form was witnessed and verified by the simulation staff.  Then, the patient was set-up in a stable reproducible  supine position for radiation therapy.  CT images were obtained.  Surface markings were placed.  The CT images were loaded into the planning software.  Then the target and avoidance structures were contoured.  Treatment planning then occurred.  The radiation prescription was entered and confirmed.  Then, I designed and supervised the construction of a total of 5 medically necessary complex treatment devices.  I have requested : 3D Simulation  I have requested a DVH of the following structures: heart lungs, spinal cord, esophagus, GTV, PTV.  I have ordered:dose calc.  PLAN:  The patient will receive 60 Gy in 30 fractions Along with radiosensitizing chemotherapy.   ________________________________   Special treatment procedure note:  The patient will be receiving radiosensitizing chemotherapy throughout his course of chest radiation treatment. Given the increased potential for toxicities as well as the necessity for close monitoring the patient and bloodwork, this constitutes a special treatment procedure.    ----  Blair Promise, PhD, MD

## 2015-11-04 NOTE — Progress Notes (Signed)
[facility] [phone]   [phone] Multidisciplinary thoracic oncology clinic  CONSULT NOTE  REFERRING PHYSICIAN: Dr. Tera Partridge  REASON FOR CONSULTATION:  65 years old white male recently diagnosed with lung cancer.  HPI Brian White is a 65 y.o. male with past medical history significant for hypertension, COPD, coronary artery disease, with erythema as well as dyslipidemia and long history of smoking. The patient also has a history of prostate cancer diagnosed in 2016 status post radiotherapy and is followed by Dr. Gaynelle Arabian. The patient underwent CT screening of the chest on 10/04/2015. It showed 2.4 cm spiculated nodule in the medial left lower lobe, highly suspicious for primary bronchogenic neoplasm. There was associated mediastinal lymphadenopathy measuring up to 2.8 cm in short axis suspicious for nodal metastasis. A PET scan was performed on 10/13/2015 and it showed 2.4 cm left lower lobe pulmonary nodule that is markedly hypermetabolic consistent with primary bronchogenic neoplasm. There was also hypermetabolic left hilar, mediastinal and right supraclavicular lymphadenopathy compatible with metastatic disease. The patient was seen by Dr. Ashok Cordia and on 10/18/2015 he underwent a video bronchoscopy with endobronchial ultrasound and biopsies. The final cytology of the 4R lymph node fine-needle aspiration (Accession: [id number]) showed malignant cells consistent with non-small cell carcinoma, favoring adenocarcinoma but there was insufficient material for any further molecular studies.Marland Kitchen MRI of the brain on 10/29/2015 showed no evidence for metastatic disease to the brain but there was widespread areas of white matter signal abnormality favoring to represent chronic multiple sclerosis. The patient has no previous diagnosis of multiple sclerosis. Dr. Cato Mulligan kindly referred the patient to me today for further evaluation and recommendation regarding  treatment of his condition. When seen today he is feeling fine except for fatigue and shortness breath with exertion. He has no chest pain but continues to have mild cough with no hemoptysis. He denied having any significant weight loss or night sweats. He has no headache or visual changes. He denied having any nausea, vomiting, diarrhea or constipation. Family history significant for father died from COPD, mother still alive at age 15 and sister had breast cancer. The patient is married and has 2 daughters. He was accompanied today by his wife [name]. He is currently retired and used to work in [employer]. He has a history of smoking 1 pack per day for around 50 years and unfortunately continues to smoke a few cigarettes every day. The patient also drinks 3-4. And whiskey at night time. He has a history of marijuana abuse in the past.  HPI  Past Medical History  Diagnosis Date  . Hypertension   . Arthritis   . CAD (coronary artery disease)     mild MI; pt unaware - seen on stress test  . Prostate cancer [facility]) 2013    s/p radiation therapy March 2013, planned f/u with uro after XRT  . H/O seasonal allergies   . H/O anaphylactic shock 2013    hives,hypotension,syncope  . Smoker   . ED (erectile dysfunction)   . Polycythemia 01/25/2014  . Allergy   . Adenocarcinoma, lung ([place]) 10/08/2015    2.4cm spiculated LLL nodule suspicious for primary bronchogenic carcinoma by CT 09/2015 - adenocarcinoma by endobronchial biopsy  . Thoracic aorta atherosclerosis ([place]) 10/08/2015    Mild by CT 09/2015   . COPD (chronic obstructive pulmonary disease) ([place]) 06/26/2011    Mild centrilobular and paraseptal emphysema by CT 09/2015   . Radiation 09/18/2011 through 11/10/2011  Prostate 7600 cGy 40 sessions, seminal vesicles 5600 cGy 40 sessions      Past Surgical History  Procedure Laterality Date  . Hernia repair Right 2005    with mesh  . Insertion  prostate radiation seed  08/2011    prostate cancer, [facility]  . Colonoscopy w/ biopsies and polypectomy  10/11/15    mult TAs, 3cm rectal polypoid lesion; rec further treatment, diverticulosis ([place])  . Endobronchial ultrasound Bilateral 10/18/2015    Procedure: ENDOBRONCHIAL ULTRASOUND;  Surgeon: Javier Glazier, MD;  Location: [facility];  Service: Cardiopulmonary;  Laterality: Bilateral;    Family History  Problem Relation Age of Onset  . Asthma Father   . COPD Father   . CAD Father     possibly  . Emphysema Father   . Breast cancer Sister   . Hypertension Mother   . Diabetes Neg Hx   . Stroke Neg Hx   . Colon cancer Neg Hx   . Colon polyps Neg Hx   . Rectal cancer Neg Hx   . Stomach cancer Neg Hx     Social History Social History  Substance Use Topics  . Smoking status: Current Every Day Smoker -- 1.00 packs/day for 49 years    Types: Cigarettes    Start date: 01/16/1967  . Smokeless tobacco: Never Used     Comment: Peak rate of 2ppd  . Alcohol Use: 0.6 oz/week    1 Shots of liquor per week     Comment: 2-3 beers or mixed drinks daily      Allergies  Allergen Reactions  . Onion Anaphylaxis    Hives     Current Outpatient Prescriptions  Medication Sig Dispense Refill  . aspirin 81 MG tablet Take 81 mg by mouth daily.      . budesonide-formoterol (SYMBICORT) 160-4.5 MCG/ACT inhaler Inhale 1 puff into the lungs 2 (two) times daily. 1 Inhaler 3  . cetirizine (ZYRTEC) 10 MG chewable tablet Chew 10 mg by mouth daily.    Marland Kitchen losartan-hydrochlorothiazide (HYZAAR) 100-25 MG tablet Take 1 tablet by mouth daily. 90 tablet 3  . EPINEPHrine (EPIPEN) 0.3 mg/0.3 mL DEVI Inject 0.3 mLs (0.3 mg total) into the muscle once. (Patient not taking: Reported on 10/28/2015) 1 Device prn  . ibuprofen (ADVIL,MOTRIN) 200 MG tablet Take 600 mg by mouth every 6 (six) hours as needed (Pain). Reported on 11/04/2015    . prochlorperazine (COMPAZINE) 10 MG tablet Take 1 tablet (10 mg  total) by mouth every 6 (six) hours as needed for nausea or vomiting. 30 tablet 0  . sildenafil (VIAGRA) 100 MG tablet Take 0.5-1 tablets (50-100 mg total) by mouth daily as needed for erectile dysfunction. (Patient not taking: Reported on 11/04/2015) 4 tablet 0   No current facility-administered medications for this visit.    Review of Systems  Constitutional: positive for fatigue Eyes: negative Ears, nose, mouth, throat, and face: negative Respiratory: positive for cough and dyspnea on exertion Cardiovascular: negative Gastrointestinal: negative Genitourinary:negative Integument/breast: negative Hematologic/lymphatic: negative Musculoskeletal:negative Neurological: negative Behavioral/Psych: negative Endocrine: negative Allergic/Immunologic: negative  Physical Exam  WNU:UVOZD, healthy, no distress, well nourished, well developed and anxious SKIN: skin color, texture, turgor are normal, no rashes or significant lesions HEAD: Normocephalic, No masses, lesions, tenderness or abnormalities EYES: normal, PERRLA, Conjunctiva are pink and non-injected EARS: External ears normal, Canals clear OROPHARYNX:no exudate, no erythema and lips, buccal mucosa, and tongue normal  NECK: supple, no adenopathy, no JVD LYMPH:  no palpable lymphadenopathy, no hepatosplenomegaly LUNGS: expiratory  wheezes bilaterally, scattered rhonchi bilaterally HEART: regular rate & rhythm, no murmurs and no gallops ABDOMEN:abdomen soft, non-tender, normal bowel sounds and no masses or organomegaly BACK: Back symmetric, no curvature., No CVA tenderness EXTREMITIES:no joint deformities, effusion, or inflammation, no edema, no skin discoloration  NEURO: alert & oriented x 3 with fluent speech, no focal motor/sensory deficits  PERFORMANCE STATUS: ECOG 1  LABORATORY DATA: Lab Results  Component Value Date   WBC 7.0 11/04/2015   HGB 17.4* 11/04/2015   HCT 52.0* 11/04/2015   MCV 104.9* 11/04/2015   PLT 148  11/04/2015      Chemistry      Component Value Date/Time   NA 144 11/04/2015 1534   NA 139 08/27/2015 0946   K 5.2 repeated* 11/04/2015 1534   K 4.5 08/27/2015 0946   CL 99 08/27/2015 0946   CO2 28 11/04/2015 1534   CO2 35* 08/27/2015 0946   BUN 16.9 11/04/2015 1534   BUN 14 10/25/2015 1036   CREATININE 1.3 11/04/2015 1534   CREATININE 0.85 10/25/2015 1036      Component Value Date/Time   CALCIUM 10.0 11/04/2015 1534   CALCIUM 10.0 08/27/2015 0946   ALKPHOS 67 11/04/2015 1534   ALKPHOS 62 08/27/2015 0946   AST 20 11/04/2015 1534   AST 21 08/27/2015 0946   ALT 13 11/04/2015 1534   ALT 20 08/27/2015 0946   BILITOT 0.61 11/04/2015 1534   BILITOT 1.2 08/27/2015 0946       RADIOGRAPHIC STUDIES: Mr Jeri Cos Wo Contrast  11/05/2015  CLINICAL DATA:  Staging for metastatic lung cancer. No reported neurologic symptoms. EXAM: MRI HEAD WITHOUT AND WITH CONTRAST TECHNIQUE: Multiplanar, multiecho pulse sequences of the brain and surrounding structures were obtained without and with intravenous contrast. CONTRAST:  20m MULTIHANCE GADOBENATE DIMEGLUMINE 529 MG/ML IV SOLN COMPARISON:  PET scan 10/13/2015. FINDINGS: Widespread white matter signal abnormality, predominantly periventricular, both focal and confluent, largely sparing the brainstem and cerebellum. Mixed pattern on diffusion-weighted imaging, largely facilitated, corresponding to the areas of T2 and FLAIR hyperintensity, argues against acute pathology. Multiple black holes on T1 sagittal and axial imaging. Corpus callosum atrophy, particularly in the proximal body. No abnormal postcontrast enhancement. Findings are consistent with chronic multiple sclerosis even though no specific history of MS is given. No acute stroke, acute hemorrhage, mass lesion, hydrocephalus, or extra-axial fluid. Premature for age atrophy. No chronic hemorrhage. Post infusion, no abnormal enhancement of the brain or meninges. No osseous findings. Visualized  extracranial soft tissues unremarkable. IMPRESSION: No intracranial metastatic disease is evident. Widespread areas of white matter signal abnormality, favored to represent chronic multiple sclerosis. See discussion above. Generalized atrophy. No osseous lesions or extracranial soft tissue abnormalities. Electronically Signed   By: JStaci RighterM.D.   On: 0April 21, 201717:26   Nm Pet Image Initial (pi) Skull Base To Thigh  10/13/2015  CLINICAL DATA:  Initial treatment strategy for pulmonary nodule. EXAM: NUCLEAR MEDICINE PET SKULL BASE TO THIGH TECHNIQUE: 10.2 mCi F-18 FDG was injected intravenously. Full-ring PET imaging was performed from the skull base to thigh after the radiotracer. CT data was obtained and used for attenuation correction and anatomic localization. FASTING BLOOD GLUCOSE:  Value: 112 mg/dl COMPARISON:  Lung cancer screening study from 10/04/2015. FINDINGS: NECK Right hypermetabolic supraclavicular lymphadenopathy identified with SUV max = 8.5. CHEST 2.4 cm spiculated left lower lobe nodule identified on recent Lung cancer screening is hypermetabolic with SUV max = 129.7 This is associated with bulky mediastinal lymphadenopathy. The 2.8 cm short axis  right paratracheal lymph node demonstrates SUV max = 20.2. Hypermetabolic left hilar lymph node demonstrates SUV max = 16.1. The heart is enlarged. ABDOMEN/PELVIS No abnormal hypermetabolic activity within the liver, pancreas, adrenal glands, or spleen. No hypermetabolic lymph nodes in the abdomen or pelvis. There is abdominal aortic atherosclerosis without aneurysm. SKELETON No focal hypermetabolic activity to suggest skeletal metastasis. IMPRESSION: 2.4 cm left lower lobe pulmonary nodule is markedly hypermetabolic, consistent with primary bronchogenic neoplasm. Hypermetabolic left hilar, mediastinal, and right supraclavicular lymphadenopathy is compatible with metastatic disease. Electronically Signed   By: Misty Stanley M.D.   On: 10/13/2015  15:39   Dg Chest Port 1 View  10/18/2015  CLINICAL DATA:  Post mediastinal lymph node biopsy. EXAM: PORTABLE CHEST 1 VIEW COMPARISON:  10/13/2015 PET CT FINDINGS: Fullness of the right paratracheal region compatible with adenopathy seen on prior CTs. Mild cardiomegaly. No confluent airspace opacities or effusions. No pneumothorax. IMPRESSION: Right paratracheal adenopathy.  No pneumothorax. Electronically Signed   By: Rolm Baptise M.D.   On: 10/18/2015 09:30    ASSESSMENT: This is a very pleasant 65 years old white male recently diagnosed with a stage IIIB (T1b, N3, M0) non-small cell lung cancer favoring adenocarcinoma presented with left lower lobe pulmonary nodule in addition to left hilar and bilateral mediastinal lymphadenopathy in addition to right supraclavicular lymph nodes diagnosed in April 2017. The patient also has a history of early-stage prostate cancer status post radiotherapy and currently on observation by his urologist.  PLAN: I had a lengthy discussion with the patient today about his current disease stage, prognosis and treatment options. I recommended for the patient to have ultrasound-guided core biopsy of the right supraclavicular lymphadenopathy to obtain sufficient material for molecular studies. I discussed with the patient his treatment options and recommended for him a course of concurrent chemoradiation. I recommended for the patient a course of concurrent chemoradiation with weekly carboplatin for AUC of 2 and paclitaxel 45 MG/M2. I discussed with the patient adverse effect of the chemotherapy including but not limited to alopecia, myelosuppression, nausea and vomiting, peripheral neuropathy, liver or renal dysfunction. I will arrange for the patient to have a chemotherapy education class before starting the first dose of his chemotherapy. He was already seen by Dr. Sondra Come for evaluation and discussion of the radiotherapy option. He is expected to start the first dose of  radiotherapy next week. I expect the patient to start the first dose of his chemotherapy on 11/15/2015. I will call his pharmacy with prescription for Compazine 10 mg by mouth every 6 hours as needed for nausea. For the questionable multiple sclerosis, I advised the patient to get referred from his primary care physician to see a neurologist for evaluation of this condition. For the history of prostate cancer, he will continue his routine follow-up visit and evaluation by his urologist Dr. Gaynelle Arabian. He would come back for follow-up visit in 3 weeks for evaluation and discussion of any adverse effects of his treatment. The patient was seen during the multidisciplinary thoracic oncology clinic today by medical oncology, thoracic navigator, social worker and physical therapist. The patient voices understanding of current disease status and treatment options and is in agreement with the current care plan.  All questions were answered. The patient knows to call the clinic with any problems, questions or concerns. We can certainly see the patient much sooner if necessary.  Thank you so much for allowing me to participate in the care of Brian White. I will continue to follow up  the patient with you and assist in his care.  I spent 55 minutes counseling the patient face to face. The total time spent in the appointment was 80 minutes.  Disclaimer: This note was dictated with voice recognition software. Similar sounding words can inadvertently be transcribed and may not be corrected upon review.   Jeremy Ditullio K. November 04, 2015, 4:44 PM

## 2015-11-04 NOTE — Patient Instructions (Signed)
Smoking Cessation, Tips for Success If you are ready to quit smoking, congratulations! You have chosen to help yourself be healthier. Cigarettes bring nicotine, tar, carbon monoxide, and other irritants into your body. Your lungs, heart, and blood vessels will be able to work better without these poisons. There are many different ways to quit smoking. Nicotine gum, nicotine patches, a nicotine inhaler, or nicotine nasal spray can help with physical craving. Hypnosis, support groups, and medicines help break the habit of smoking. WHAT THINGS CAN I DO TO MAKE QUITTING EASIER?  Here are some tips to help you quit for good:  Pick a date when you will quit smoking completely. Tell all of your friends and family about your plan to quit on that date.  Do not try to slowly cut down on the number of cigarettes you are smoking. Pick a quit date and quit smoking completely starting on that day.  Throw away all cigarettes.   Clean and remove all ashtrays from your home, work, and car.  On a card, write down your reasons for quitting. Carry the card with you and read it when you get the urge to smoke.  Cleanse your body of nicotine. Drink enough water and fluids to keep your urine clear or pale yellow. Do this after quitting to flush the nicotine from your body.  Learn to predict your moods. Do not let a bad situation be your excuse to have a cigarette. Some situations in your life might tempt you into wanting a cigarette.  Never have "just one" cigarette. It leads to wanting another and another. Remind yourself of your decision to quit.  Change habits associated with smoking. If you smoked while driving or when feeling stressed, try other activities to replace smoking. Stand up when drinking your coffee. Brush your teeth after eating. Sit in a different chair when you read the paper. Avoid alcohol while trying to quit, and try to drink fewer caffeinated beverages. Alcohol and caffeine may urge you to  smoke.  Avoid foods and drinks that can trigger a desire to smoke, such as sugary or spicy foods and alcohol.  Ask people who smoke not to smoke around you.  Have something planned to do right after eating or having a cup of coffee. For example, plan to take a walk or exercise.  Try a relaxation exercise to calm you down and decrease your stress. Remember, you may be tense and nervous for the first 2 weeks after you quit, but this will pass.  Find new activities to keep your hands busy. Play with a pen, coin, or rubber band. Doodle or draw things on paper.  Brush your teeth right after eating. This will help cut down on the craving for the taste of tobacco after meals. You can also try mouthwash.   Use oral substitutes in place of cigarettes. Try using lemon drops, carrots, cinnamon sticks, or chewing gum. Keep them handy so they are available when you have the urge to smoke.  When you have the urge to smoke, try deep breathing.  Designate your home as a nonsmoking area.  If you are a heavy smoker, ask your health care provider about a prescription for nicotine chewing gum. It can ease your withdrawal from nicotine.  Reward yourself. Set aside the cigarette money you save and buy yourself something nice.  Look for support from others. Join a support group or smoking cessation program. Ask someone at home or at work to help you with your plan   to quit smoking.  Always ask yourself, "Do I need this cigarette or is this just a reflex?" Tell yourself, "Today, I choose not to smoke," or "I do not want to smoke." You are reminding yourself of your decision to quit.  Do not replace cigarette smoking with electronic cigarettes (commonly called e-cigarettes). The safety of e-cigarettes is unknown, and some may contain harmful chemicals.  If you relapse, do not give up! Plan ahead and think about what you will do the next time you get the urge to smoke. HOW WILL I FEEL WHEN I QUIT SMOKING? You  may have symptoms of withdrawal because your body is used to nicotine (the addictive substance in cigarettes). You may crave cigarettes, be irritable, feel very hungry, cough often, get headaches, or have difficulty concentrating. The withdrawal symptoms are only temporary. They are strongest when you first quit but will go away within 10-14 days. When withdrawal symptoms occur, stay in control. Think about your reasons for quitting. Remind yourself that these are signs that your body is healing and getting used to being without cigarettes. Remember that withdrawal symptoms are easier to treat than the major diseases that smoking can cause.  Even after the withdrawal is over, expect periodic urges to smoke. However, these cravings are generally short lived and will go away whether you smoke or not. Do not smoke! WHAT RESOURCES ARE AVAILABLE TO HELP ME QUIT SMOKING? Your health care provider can direct you to community resources or hospitals for support, which may include:  Group support.  Education.  Hypnosis.  Therapy.   This information is not intended to replace advice given to you by your health care provider. Make sure you discuss any questions you have with your health care provider.   Document Released: 03/31/2004 Document Revised: 07/24/2014 Document Reviewed: 12/19/2012 Elsevier Interactive Patient Education 2016 [employer]  

## 2015-11-05 ENCOUNTER — Other Ambulatory Visit: Payer: Self-pay | Admitting: Radiology

## 2015-11-05 ENCOUNTER — Encounter: Payer: Self-pay | Admitting: *Deleted

## 2015-11-05 NOTE — Progress Notes (Signed)
Oncology Nurse Navigator Documentation  Oncology Nurse Navigator Flowsheets 11/05/2015  Navigator Location [facility]  Navigator Encounter Type Clinic/[facility]  Abnormal Finding Date 09/30/2015  Confirmed Diagnosis Date 10/18/2015  Treatment Initiated Date 11/02/2015  Patient Visit Type Other  Treatment Phase Pre-Tx/Tx Discussion  Barriers/Navigation Needs Education;Coordination of Care  Education Newly Diagnosed Cancer Education;Other  Interventions Coordination of Care;Education Method  Coordination of Care Appts  Education Method Verbal;Written  Acuity Level 2  Time Spent with Patient 37   Spoke to patient and wife yesterday at [facility].  Gave and explained information on dx, staging, tx, support services, and next steps.

## 2015-11-05 NOTE — Therapy (Signed)
[place], [place], [address] Phone: [phone]   Fax:  [phone]  Physical Therapy Evaluation  Patient Details  Name: Brian White MRN: [id number] Date of Birth: Aug 19, 1950 Referring Provider: Dr. [name]  Encounter Date: 11/04/2015      PT End of Session - 11/05/15 1315    Visit Number 1   Number of Visits 1   PT Start Time 9678   PT Stop Time 1655   PT Time Calculation (min) 22 min   Activity Tolerance Patient tolerated treatment well   Behavior During Therapy [facility] for tasks assessed/performed      Past Medical History  Diagnosis Date  . Hypertension   . Arthritis   . CAD (coronary artery disease)     mild MI; pt unaware - seen on stress test  . Prostate cancer [facility]) 2013    s/p radiation therapy March 2013, planned f/u with uro after XRT  . H/O seasonal allergies   . H/O anaphylactic shock 2013    hives,hypotension,syncope  . Smoker   . ED (erectile dysfunction)   . Polycythemia 01/25/2014  . Allergy   . Adenocarcinoma, lung ([place]) 10/08/2015    2.4cm spiculated LLL nodule suspicious for primary bronchogenic carcinoma by CT 09/2015 - adenocarcinoma by endobronchial biopsy  . Thoracic aorta atherosclerosis ([place]) 10/08/2015    Mild by CT 09/2015   . COPD (chronic obstructive pulmonary disease) ([place]) 06/26/2011    Mild centrilobular and paraseptal emphysema by CT 09/2015   . Radiation 09/18/2011 through 11/10/2011    Prostate 7600 cGy 40 sessions, seminal vesicles 5600 cGy 40 sessions      Past Surgical History  Procedure Laterality Date  . Hernia repair Right 2005    with mesh  . Insertion prostate radiation seed  08/2011    prostate cancer, Tannenbaum  . Colonoscopy w/ biopsies and polypectomy  10/11/15    mult TAs, 3cm rectal polypoid lesion; rec further treatment, diverticulosis ([place])  . Endobronchial ultrasound Bilateral 10/18/2015    Procedure:  ENDOBRONCHIAL ULTRASOUND;  Surgeon: Javier Glazier, MD;  Location: [facility];  Service: Cardiopulmonary;  Laterality: Bilateral;    There were no vitals filed for this visit.       Subjective Assessment - 11/05/15 0847    Subjective reports chronic episodic low back pain that disabled him; can't lift heavy weights and uses Advil to help it when it flares up   Patient is accompained by: Family member  wife   Pertinent History Patient with finding on low dose CT screening, followed by PET, EBUS, and MRI brain.  EBUS fine needle aspiration of 4R node indicated malignant cells consistent with non-small cell carcinoma that favors adenocarcinoma.  He has ipsilateral, contralateral and supraclavicular nodes that are positive, so staged at IV.  Will have further biopsy of node for more definitive diagnostic workup; will have radiation and either chemotherapy or immunotherapy.  Current smoker; h/o prostate cancer s/p radiation seed placement 2013; HTN, arthritis, CAD, COPD.   Patient Stated Goals get info from all lung clinic providers   Currently in Pain? No/denies            [facility] PT Assessment - 11/05/15 0001    Assessment   Medical Diagnosis stage IV lung cancer, probably adenocarcinoma   Referring Provider Dr. [name]   Onset Date/Surgical Date 10/13/15   Precautions   Precautions Other (comment)   Precaution Comments cancer precautions   Restrictions   Weight Bearing  Restrictions No   Balance Screen   Has the patient fallen in the past 6 months No   Has the patient had a decrease in activity level because of a fear of falling?  No   Is the patient reluctant to leave their home because of a fear of falling?  No   Home Ecologist residence   Living Arrangements Spouse/significant other   Type of [place] One level   Prior Function   Level of [place] is active with yardwork, but no regular  exercise   Cognition   Overall Cognitive Status Within Functional Limits for tasks assessed   Functional Tests   Functional tests Sit to Stand   Sit to Stand   Comments 11 times in 30 seconds, less than average for age   Posture/Postural Control   Posture/Postural Control Postural limitations   Postural Limitations Forward head  slight   ROM / Strength   AROM / PROM / Strength AROM   AROM   Overall AROM Comments trunk AROM in standing:  flexion WFL, extension 25% loss, sidebend 25% loss bilat., rotation WFL bilat.   Ambulation/Gait   Ambulation/Gait Yes   Ambulation/Gait Assistance 7: Independent   Balance   Balance Assessed Yes   Dynamic Standing Balance   Dynamic Standing - Comments reaches 14 inches forward in standing, about average for age                           PT Education - 11/05/15 1314    Education provided Yes   Education Details posture, breathing, energy conservation, walking, staying active through treatment, PT info   Person(s) Educated Patient;Spouse   Methods Explanation;Handout   Comprehension Verbalized understanding               Lung Clinic Goals - 11/05/15 1320    Patient will be able to verbalize understanding of the benefit of exercise to decrease fatigue.   Status Achieved   Patient will be able to verbalize the importance of posture.   Status Achieved   Patient will be able to demonstrate diaphragmatic breathing for improved lung function.   Status Achieved   Patient will be able to verbalize understanding of the role of physical therapy to prevent functional decline and who to contact if physical therapy is needed.   Status Achieved             Plan - 11/05/15 1316    Clinical Impression Statement Patient's status was discussed with interdisciplinary team at care conference prior to visit.  Pt. with slight forward head, decreased score on 30-second sit to stand, decreased back AROM with h/o back injury and  DDD, and not currently doing a regular exercise program.  Now diagnosed with lung cancer, stage IV, and to receive treatment for that.   Rehab Potential Fair   PT Frequency One time visit   PT Treatment/Interventions Patient/family education   PT Next Visit Plan None at this time.   PT Home Exercise Plan see education section   Consulted and Agree with Plan of Care Patient      Patient will benefit from skilled therapeutic intervention in order to improve the following deficits and impairments:  Decreased activity tolerance, Decreased range of motion, Pain  Visit Diagnosis: Other symptoms and signs involving the musculoskeletal system - Plan: PT plan of care cert/re-cert  Midline low back pain  without sciatica - Plan: PT plan of care cert/re-cert      G-Codes - 07/07/40 1320    Functional Assessment Tool Used clinical judgement   Functional Limitation Changing and maintaining body position   Changing and Maintaining Body Position Current Status [phone]) At least 1 percent but less than 20 percent impaired, limited or restricted   Changing and Maintaining Body Position Goal Status (X4276) At least 1 percent but less than 20 percent impaired, limited or restricted   Changing and Maintaining Body Position Discharge Status (R0110) At least 1 percent but less than 20 percent impaired, limited or restricted       Problem List Patient Active Problem List   Diagnosis Date Noted  . Cancer of lower lobe of left lung ([place]) 10/28/2015  . Lung nodule < 6cm on CT   . Thoracic aorta atherosclerosis ([place]) 10/08/2015  . Pulmonary nodule, left 10/08/2015  . Advanced care planning/counseling discussion 08/27/2015  . Health care maintenance 01/29/2014  . Polycythemia 01/25/2014  . HLD (hyperlipidemia) 01/25/2014  . Medicare annual wellness visit, subsequent 11/22/2012  . COPD exacerbation ([place]) 11/01/2012  . H/O seasonal allergies   . Smoker   . CAD (coronary artery disease)   . ED (erectile  dysfunction)   . Prostate cancer ([place]) 08/02/2011  . HTN (hypertension) 06/26/2011  . COPD (chronic obstructive pulmonary disease) ([place]) 06/26/2011    Yann Biehn 11/05/2015, 1:22 PM  [place], [place], [address] Phone: [phone]   Fax:  [phone]  Name: Oshay Stranahan MRN: [id number] Date of Birth: 1950/12/16   Serafina Royals, PT 11/05/2015 1:22 PM

## 2015-11-08 ENCOUNTER — Ambulatory Visit (HOSPITAL_COMMUNITY)
Admission: RE | Admit: 2015-11-08 | Discharge: 2015-11-08 | Disposition: A | Discharge status: Home / Self Care | Payer: BLUE CROSS/BLUE SHIELD | Source: Ambulatory Visit | Attending: Internal Medicine | Admitting: Internal Medicine

## 2015-11-08 ENCOUNTER — Encounter (HOSPITAL_COMMUNITY): Payer: Self-pay

## 2015-11-08 DIAGNOSIS — Z923 Personal history of irradiation: Secondary | ICD-10-CM | POA: Insufficient documentation

## 2015-11-08 DIAGNOSIS — C3432 Malignant neoplasm of lower lobe, left bronchus or lung: Secondary | ICD-10-CM | POA: Diagnosis not present

## 2015-11-08 DIAGNOSIS — C7B8 Other secondary neuroendocrine tumors: Secondary | ICD-10-CM | POA: Diagnosis not present

## 2015-11-08 DIAGNOSIS — I2510 Atherosclerotic heart disease of native coronary artery without angina pectoris: Secondary | ICD-10-CM | POA: Insufficient documentation

## 2015-11-08 DIAGNOSIS — J449 Chronic obstructive pulmonary disease, unspecified: Secondary | ICD-10-CM | POA: Insufficient documentation

## 2015-11-08 DIAGNOSIS — R590 Localized enlarged lymph nodes: Secondary | ICD-10-CM | POA: Diagnosis not present

## 2015-11-08 DIAGNOSIS — Z8249 Family history of ischemic heart disease and other diseases of the circulatory system: Secondary | ICD-10-CM | POA: Insufficient documentation

## 2015-11-08 DIAGNOSIS — Z79899 Other long term (current) drug therapy: Secondary | ICD-10-CM | POA: Insufficient documentation

## 2015-11-08 DIAGNOSIS — I10 Essential (primary) hypertension: Secondary | ICD-10-CM | POA: Diagnosis not present

## 2015-11-08 DIAGNOSIS — D751 Secondary polycythemia: Secondary | ICD-10-CM | POA: Diagnosis not present

## 2015-11-08 DIAGNOSIS — Z7982 Long term (current) use of aspirin: Secondary | ICD-10-CM | POA: Diagnosis not present

## 2015-11-08 DIAGNOSIS — Z8546 Personal history of malignant neoplasm of prostate: Secondary | ICD-10-CM | POA: Insufficient documentation

## 2015-11-08 DIAGNOSIS — C7A8 Other malignant neuroendocrine tumors: Secondary | ICD-10-CM | POA: Diagnosis not present

## 2015-11-08 DIAGNOSIS — Z825 Family history of asthma and other chronic lower respiratory diseases: Secondary | ICD-10-CM | POA: Insufficient documentation

## 2015-11-08 DIAGNOSIS — F17210 Nicotine dependence, cigarettes, uncomplicated: Secondary | ICD-10-CM | POA: Insufficient documentation

## 2015-11-08 HISTORY — DX: Reserved for inherently not codable concepts without codable children: IMO0001

## 2015-11-08 LAB — CBC WITH DIFFERENTIAL/PLATELET
Basophils Absolute: 0 10*3/uL (ref 0.0–0.1)
Basophils Relative: 0 %
Eosinophils Absolute: 0 10*3/uL (ref 0.0–0.7)
Eosinophils Relative: 1 %
HCT: 50.1 % (ref 39.0–52.0)
Hemoglobin: 17.9 g/dL — ABNORMAL HIGH (ref 13.0–17.0)
Lymphocytes Relative: 24 %
Lymphs Abs: 1.6 10*3/uL (ref 0.7–4.0)
MCH: 36.4 pg — ABNORMAL HIGH (ref 26.0–34.0)
MCHC: 35.7 g/dL (ref 30.0–36.0)
MCV: 101.8 fL — ABNORMAL HIGH (ref 78.0–100.0)
Monocytes Absolute: 0.6 10*3/uL (ref 0.1–1.0)
Monocytes Relative: 10 %
Neutro Abs: 4.1 10*3/uL (ref 1.7–7.7)
Neutrophils Relative %: 65 %
Platelets: 129 10*3/uL — ABNORMAL LOW (ref 150–400)
RBC: 4.92 MIL/uL (ref 4.22–5.81)
RDW: 14.2 % (ref 11.5–15.5)
WBC: 6.4 10*3/uL (ref 4.0–10.5)

## 2015-11-08 LAB — PROTIME-INR
INR: 1.01 (ref 0.00–1.49)
Prothrombin Time: 13.1 seconds (ref 11.6–15.2)

## 2015-11-08 MED ORDER — HYDROCODONE-ACETAMINOPHEN 5-325 MG PO TABS
1.0000 | ORAL_TABLET | ORAL | Status: DC | PRN
  Filled 2015-11-08: qty 2

## 2015-11-08 MED ORDER — MIDAZOLAM HCL 2 MG/2ML IJ SOLN
INTRAMUSCULAR | Status: AC | PRN
  Administered 2015-11-08 (×2): 1 mg via INTRAVENOUS

## 2015-11-08 MED ORDER — FENTANYL CITRATE (PF) 100 MCG/2ML IJ SOLN
INTRAMUSCULAR | Status: AC
  Filled 2015-11-08: qty 2

## 2015-11-08 MED ORDER — SODIUM CHLORIDE 0.9 % IV SOLN
INTRAVENOUS | Status: DC
  Administered 2015-11-08: 13:00:00 via INTRAVENOUS

## 2015-11-08 MED ORDER — FENTANYL CITRATE (PF) 100 MCG/2ML IJ SOLN
INTRAMUSCULAR | Status: AC | PRN
  Administered 2015-11-08: 50 ug via INTRAVENOUS
  Administered 2015-11-08: 25 ug via INTRAVENOUS

## 2015-11-08 MED ORDER — MIDAZOLAM HCL 2 MG/2ML IJ SOLN
INTRAMUSCULAR | Status: AC
  Filled 2015-11-08: qty 4

## 2015-11-08 NOTE — H&P (Signed)
Chief Complaint: Patient was seen in consultation today for ultrasound-guided right supraclavicular lymph node biopsy (outpatient)  Referring Physician(s): Mohamed,Mohamed  Supervising Physician: Daryll Brod  History of Present Illness: Brian White is a 65 y.o. male , long-term smoker, with recently diagnosed stage IIIB (T1b, N3, M0) non-small cell lung cancer favoring adenocarcinoma who presented with left lower lobe pulmonary nodule in addition to left hilar and bilateral mediastinal lymphadenopathy in addition to right supraclavicular lymph nodes  in April 2017.He also has a history of early-stage prostate cancer status post radiotherapy and currently on observation by his urologist. He presents today for ultrasound-guided right supraclavicular lymph node biopsy for molecular study analysis. Additional medical history as below.  Past Medical History  Diagnosis Date  . Hypertension   . Arthritis   . CAD (coronary artery disease)     mild MI; pt unaware - seen on stress test  . H/O seasonal allergies   . H/O anaphylactic shock 2013    hives,hypotension,syncope  . Smoker   . ED (erectile dysfunction)   . Polycythemia 01/25/2014  . Allergy   . Thoracic aorta atherosclerosis ([place]) 10/08/2015    Mild by CT 09/2015   . COPD (chronic obstructive pulmonary disease) ([place]) 06/26/2011    Mild centrilobular and paraseptal emphysema by CT 09/2015   . Radiation 09/18/2011 through 11/10/2011    Prostate 7600 cGy 40 sessions, seminal vesicles 5600 cGy 40 sessions    . Shortness of breath dyspnea   . Prostate cancer [facility]) 2013    s/p radiation therapy March 2013, planned f/u with uro after XRT  . Adenocarcinoma, lung ([place]) 10/08/2015    2.4cm spiculated LLL nodule suspicious for primary bronchogenic carcinoma by CT 09/2015 - adenocarcinoma by endobronchial biopsy    Past Surgical History  Procedure Laterality Date  . Hernia repair Right 2005    with mesh  .  Insertion prostate radiation seed  08/2011    prostate cancer, Tannenbaum  . Colonoscopy w/ biopsies and polypectomy  10/11/15    mult TAs, 3cm rectal polypoid lesion; rec further treatment, diverticulosis ([place])  . Endobronchial ultrasound Bilateral 10/18/2015    Procedure: ENDOBRONCHIAL ULTRASOUND;  Surgeon: Javier Glazier, MD;  [facility];  Service: Cardiopulmonary;  Laterality: Bilateral;    Allergies: Onion  Medications: Prior to Admission medications   Medication Sig Start Date End Date Taking? Authorizing Provider  aspirin 81 MG tablet Take 81 mg by mouth daily.      Historical Provider, MD  budesonide-formoterol (SYMBICORT) 160-4.5 MCG/ACT inhaler Inhale 1 puff into the lungs 2 (two) times daily. 08/27/15   [name], MD  cetirizine (ZYRTEC) 10 MG chewable tablet Chew 10 mg by mouth daily.    Historical Provider, MD  EPINEPHrine (EPIPEN) 0.3 mg/0.3 mL DEVI Inject 0.3 mLs (0.3 mg total) into the muscle once. Patient not taking: Reported on 10/28/2015 06/26/11   Cherene Altes, MD  ibuprofen (ADVIL,MOTRIN) 200 MG tablet Take 600 mg by mouth every 6 (six) hours as needed (Pain). Reported on 11/04/2015    Historical Provider, MD  losartan-hydrochlorothiazide (HYZAAR) 100-25 MG tablet Take 1 tablet by mouth daily. 08/27/15   [name], MD  prochlorperazine (COMPAZINE) 10 MG tablet Take 1 tablet (10 mg total) by mouth every 6 (six) hours as needed for nausea or vomiting. 11/04/15   [name], MD  sildenafil (VIAGRA) 100 MG tablet Take 0.5-1 tablets (50-100 mg total) by mouth daily as needed for erectile dysfunction. Patient not taking: Reported on  11/04/2015 10/31/12   [name], MD     Family History  Problem Relation Age of Onset  . Asthma Father   . COPD Father   . CAD Father     possibly  . Emphysema Father   . Breast cancer Sister   . Hypertension Mother   . Diabetes Neg Hx   . Stroke Neg Hx   . Colon cancer Neg Hx   . Colon polyps  Neg Hx   . Rectal cancer Neg Hx   . Stomach cancer Neg Hx     Social History   Social History  . Marital Status: Married    Spouse Name: N/A  . Number of Children: Y  . Years of Education: N/A   Occupational History  . retired    Social History Main Topics  . Smoking status: Current Every Day Smoker -- 1.00 packs/day for 49 years    Types: Cigarettes    Start date: 01/16/1967  . Smokeless tobacco: Never Used     Comment: Peak rate of 2ppd  . Alcohol Use: 0.6 oz/week    1 Shots of liquor per week     Comment: 2-3 beers or mixed drinks daily    . Drug Use: Yes    Special: Marijuana     Comment: marijuana occasional weekly  . Sexual Activity: Yes    Birth Control/ Protection: None   Other Topics Concern  . None   Social History Narrative   Lives with wife, dogs, chickens and goats   Occupation: retired.  On disability for herinated discs in back   Edu: HS   Activity: works outside [employer], takes care of farm animals   Diet: good water, fruits/vegetables daily      [place] [facility]:   Originally from [place]. 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 No known asbestos exposure. Questionable mold exposure. Has 2 dogs currently. No recent bird exposure. Previously had chickens.       Review of Systems  Constitutional: Negative for fever and chills.  Respiratory: Positive for cough and shortness of breath.   Cardiovascular: Negative for chest pain.  Gastrointestinal: Negative for nausea, vomiting, abdominal pain and blood in stool.  Genitourinary: Negative for dysuria and hematuria.  Musculoskeletal: Negative for back pain.  Neurological: Negative for headaches.    Vital Signs: BP 177/96  HR 87  R 18  O2 SATS 100% RA  TEMP 98.2   Physical Exam  Constitutional: He is oriented to person, place, and time. He appears well-developed and well-nourished.  Cardiovascular:  Slightly bradycardic with  occasional pause noted  Pulmonary/Chest:  Distant BS bilat  Abdominal: Soft. Bowel sounds are normal.  Musculoskeletal: Normal range of motion. He exhibits no edema.  Neurological: He is alert and oriented to person, place, and time.    Mallampati Score:     Imaging: Mr Kizzie Fantasia Contrast  10/29/2015  CLINICAL DATA:  Staging for metastatic lung cancer. No reported neurologic symptoms. EXAM: MRI HEAD WITHOUT AND WITH CONTRAST TECHNIQUE: Multiplanar, multiecho pulse sequences of the brain and surrounding structures were obtained without and with intravenous contrast. CONTRAST:  46m MULTIHANCE GADOBENATE DIMEGLUMINE 529 MG/ML IV SOLN COMPARISON:  PET scan 10/13/2015. FINDINGS: Widespread white matter signal abnormality, predominantly periventricular, both focal and confluent, largely sparing the brainstem and cerebellum. Mixed pattern on diffusion-weighted imaging, largely facilitated, corresponding to the areas of T2 and FLAIR hyperintensity, argues against acute pathology.  Multiple black holes on T1 sagittal and axial imaging. Corpus callosum atrophy, particularly in the proximal body. No abnormal postcontrast enhancement. Findings are consistent with chronic multiple sclerosis even though no specific history of MS is given. No acute stroke, acute hemorrhage, mass lesion, hydrocephalus, or extra-axial fluid. Premature for age atrophy. No chronic hemorrhage. Post infusion, no abnormal enhancement of the brain or meninges. No osseous findings. Visualized extracranial soft tissues unremarkable. IMPRESSION: No intracranial metastatic disease is evident. Widespread areas of white matter signal abnormality, favored to represent chronic multiple sclerosis. See discussion above. Generalized atrophy. No osseous lesions or extracranial soft tissue abnormalities. Electronically Signed   By: Staci Righter M.D.   On: 10/29/2015 17:26   Nm Pet Image Initial (pi) Skull Base To Thigh  10/13/2015  CLINICAL DATA:   Initial treatment strategy for pulmonary nodule. EXAM: NUCLEAR MEDICINE PET SKULL BASE TO THIGH TECHNIQUE: 10.2 mCi F-18 FDG was injected intravenously. Full-ring PET imaging was performed from the skull base to thigh after the radiotracer. CT data was obtained and used for attenuation correction and anatomic localization. FASTING BLOOD GLUCOSE:  Value: 112 mg/dl COMPARISON:  Lung cancer screening study from 10/04/2015. FINDINGS: NECK Right hypermetabolic supraclavicular lymphadenopathy identified with SUV max = 8.5. CHEST 2.4 cm spiculated left lower lobe nodule identified on recent Lung cancer screening is hypermetabolic with SUV max = 45.8. This is associated with bulky mediastinal lymphadenopathy. The 2.8 cm short axis right paratracheal lymph node demonstrates SUV max = 20.2. Hypermetabolic left hilar lymph node demonstrates SUV max = 16.1. The heart is enlarged. ABDOMEN/PELVIS No abnormal hypermetabolic activity within the liver, pancreas, adrenal glands, or spleen. No hypermetabolic lymph nodes in the abdomen or pelvis. There is abdominal aortic atherosclerosis without aneurysm. SKELETON No focal hypermetabolic activity to suggest skeletal metastasis. IMPRESSION: 2.4 cm left lower lobe pulmonary nodule is markedly hypermetabolic, consistent with primary bronchogenic neoplasm. Hypermetabolic left hilar, mediastinal, and right supraclavicular lymphadenopathy is compatible with metastatic disease. Electronically Signed   By: Misty Stanley M.D.   On: 10/13/2015 15:39   Dg Chest Port 1 View  10/18/2015  CLINICAL DATA:  Post mediastinal lymph node biopsy. EXAM: PORTABLE CHEST 1 VIEW COMPARISON:  10/13/2015 PET CT FINDINGS: Fullness of the right paratracheal region compatible with adenopathy seen on prior CTs. Mild cardiomegaly. No confluent airspace opacities or effusions. No pneumothorax. IMPRESSION: Right paratracheal adenopathy.  No pneumothorax. Electronically Signed   By: Rolm Baptise M.D.   On: 10/18/2015  09:30    Labs:  CBC:  Recent Labs  08/27/15 0946 09/13/15 0818 11/04/15 1534 11/08/15 1230  WBC 7.0 6.9 7.0 6.4  HGB 18.2 Repeated and verified X2.* 17.9* 17.4* 17.9*  HCT 54.8* 52.1* 52.0* 50.1  PLT 133.0* 142.0* 148 129*    COAGS:  Recent Labs  11/08/15 1230  INR 1.01    BMP:  Recent Labs  08/27/15 0946 10/25/15 1036 11/04/15 1534  NA 139  --  144  K 4.5  --  5.2 repeated*  CL 99  --   --   CO2 35*  --  28  GLUCOSE 105*  --  95  BUN 12 14 16.9  CALCIUM 10.0  --  10.0  CREATININE 0.85 0.85 1.3    LIVER FUNCTION TESTS:  Recent Labs  08/27/15 0946 11/04/15 1534  BILITOT 1.2 0.61  AST 21 20  ALT 20 13  ALKPHOS 62 67  PROT 7.3 7.1  ALBUMIN 4.4 4.0    TUMOR MARKERS: No results for input(s): AFPTM,  CEA, CA199, CHROMGRNA in the last 8760 hours.  Assessment and Plan:  65 y.o. male , long-term smoker, with recently diagnosed stage IIIB (T1b, N3, M0) non-small cell lung cancer favoring adenocarcinoma who presented with left lower lobe pulmonary nodule in addition to left hilar and bilateral mediastinal lymphadenopathy in addition to right supraclavicular lymph nodes  in April 2017.He also has a history of early-stage prostate cancer status post radiotherapy and currently on observation by his urologist. He presents today for ultrasound-guided right supraclavicular lymph node biopsy for molecular study analysis. Risks and benefits discussed with the patient/wife including, but not limited to bleeding, infection, damage to adjacent structures or low yield requiring additional tests.All of the patient's questions were answered, patient is agreeable to proceed.Consent signed and in chart.     Thank you for this interesting consult.  I greatly enjoyed meeting Nowell Sites and look forward to participating in their care.  A copy of this report was sent to the requesting provider on this date.  Electronically Signed: D. Rowe Robert 11/08/2015, 12:56 PM   I  spent a total of 20 minutes in face to face in clinical consultation, greater than 50% of which was counseling/coordinating care for US guided right supraclavicular lymph node biopsy

## 2015-11-08 NOTE — Sedation Documentation (Signed)
Patient denies pain and is resting comfortably.  

## 2015-11-08 NOTE — Discharge Instructions (Signed)
Needle Biopsy, Care After °These instructions give you information about caring for yourself after your procedure. Your doctor may also give you more specific instructions. Call your doctor if you have any problems or questions after your procedure. °HOME CARE °· Rest as told by your doctor. °· Take medicines only as told by your doctor. °· There are many different ways to close and cover the biopsy site, including stitches (sutures), skin glue, and adhesive strips. Follow instructions from your doctor about: °¨ How to take care of your biopsy site. °¨ When and how you should change your bandage (dressing). °¨ When you should remove your dressing. °¨ Removing whatever was used to close your biopsy site. °· Check your biopsy site every day for signs of infection. Watch for: °¨ Redness, swelling, or pain. °¨ Fluid, blood, or pus. °GET HELP IF: °· You have a fever. °· You have redness, swelling, or pain at the biopsy site, and it lasts longer than a few days. °· You have fluid, blood, or pus coming from the biopsy site. °· You feel sick to your stomach (nauseous). °· You throw up (vomit). °GET HELP RIGHT AWAY IF: °· You are short of breath. °· You have trouble breathing. °· Your chest hurts. °· You feel dizzy or you pass out (faint). °· You have bleeding that does not stop with pressure or a bandage. °· You cough up blood. °· Your belly (abdomen) hurts. °  °This information is not intended to replace advice given to you by your health care provider. Make sure you discuss any questions you have with your health care provider. °  °Document Released: 06/15/2008 Document Revised: 11/17/2014 Document Reviewed: 06/29/2014 °Elsevier Interactive Patient Education ©2016 [employer] °Moderate Conscious Sedation, Adult, Care After °Refer to this sheet in the next few weeks. These instructions provide you with information on caring for yourself after your procedure. Your health care provider may also give you more specific  instructions. Your treatment has been planned according to current medical practices, but problems sometimes occur. Call your health care provider if you have any problems or questions after your procedure. °WHAT TO EXPECT AFTER THE PROCEDURE  °After your procedure: °· You may feel sleepy, clumsy, and have poor balance for several hours. °· Vomiting may occur if you eat too soon after the procedure. °HOME CARE INSTRUCTIONS °· Do not participate in any activities where you could become injured for at least 24 hours. Do not: °· Drive. °· Swim. °· Ride a bicycle. °· Operate heavy machinery. °· Cook. °· Use power tools. °· Climb ladders. °· Work from a high place. °· Do not make important decisions or sign legal documents until you are improved. °· If you vomit, drink water, juice, or soup when you can drink without vomiting. Make sure you have little or no nausea before eating solid foods. °· Only take over-the-counter or prescription medicines for pain, discomfort, or fever as directed by your health care provider. °· Make sure you and your family fully understand everything about the medicines given to you, including what side effects may occur. °· You should not drink alcohol, take sleeping pills, or take medicines that cause drowsiness for at least 24 hours. °· If you smoke, do not smoke without supervision. °· If you are feeling better, you may resume normal activities 24 hours after you were sedated. °· Keep all appointments with your health care provider. °SEEK MEDICAL CARE IF: °· Your skin is pale or bluish in color. °· You   continue to feel nauseous or vomit. °· Your pain is getting worse and is not helped by medicine. °· You have bleeding or swelling. °· You are still sleepy or feeling clumsy after 24 hours. °SEEK IMMEDIATE MEDICAL CARE IF: °· You develop a rash. °· You have difficulty breathing. °· You develop any type of allergic problem. °· You have a fever. °MAKE SURE YOU: °· Understand these  instructions. °· Will watch your condition. °· Will get help right away if you are not doing well or get worse. °  °This information is not intended to replace advice given to you by your health care provider. Make sure you discuss any questions you have with your health care provider. °  °Document Released: 04/23/2013 Document Revised: 07/24/2014 Document Reviewed: 04/23/2013 °Elsevier Interactive Patient Education ©2016 [employer] Inc. ° °

## 2015-11-08 NOTE — Procedures (Signed)
[place] core biopsy R supraclav LAN No complication No blood loss. See complete dictation in [facility].

## 2015-11-09 ENCOUNTER — Ambulatory Visit
Admission: RE | Admit: 2015-11-09 | Discharge: 2015-11-09 | Disposition: A | Discharge status: Home / Self Care | Payer: BLUE CROSS/BLUE SHIELD | Source: Ambulatory Visit | Attending: Radiation Oncology | Admitting: Radiation Oncology

## 2015-11-09 ENCOUNTER — Encounter: Payer: Self-pay | Admitting: Radiation Oncology

## 2015-11-09 ENCOUNTER — Ambulatory Visit: Payer: BLUE CROSS/BLUE SHIELD

## 2015-11-09 VITALS — BP 154/85 | HR 61 | Temp 97.6°F | Ht 71.0 in | Wt 216.1 lb

## 2015-11-09 DIAGNOSIS — C3432 Malignant neoplasm of lower lobe, left bronchus or lung: Secondary | ICD-10-CM | POA: Diagnosis not present

## 2015-11-09 DIAGNOSIS — Z510 Encounter for antineoplastic radiation therapy: Secondary | ICD-10-CM | POA: Diagnosis not present

## 2015-11-09 NOTE — Progress Notes (Signed)
  Radiation Oncology         [phone]) [phone] ________________________________  Name: Brian White MRN: [id number]  Date: 11/09/2015  DOB: March 10, 1951  Weekly Radiation Therapy Management   ICD-9-CM ICD-10-CM   1. Cancer of lower lobe of left lung (HCC) 162.5 C34.32      Current Dose: 2 Gy     Planned Dose:  60 Gy  Narrative . . . . . . . . The patient presents for routine under treatment assessment.                                   The patient is without complaint.                                 Set-up films were reviewed.                                 The chart was checked. Physical Findings. . .  height is '5\' 11"'$  (1.803 m) and weight is 216 lb 1.6 oz (98.022 kg). His temperature is 97.6 F (36.4 C). His blood pressure is 154/85 and his pulse is 61. His oxygen saturation is 100%. . Weight essentially stable.  No significant changes.  The lungs are clear. The heart has a regular rhythm and rate Impression . . . . . . . The patient is tolerating radiation. Plan . . . . . . . . . . . . Continue treatment as planned. The patient had a biopsy of his right subclavicular region yesterday to assess for potential targeted therapy options.  The patient's initial biopsy was insufficient for assessing this issue. ________________________________   Blair Promise, PhD, MD  This document serves as a record of services personally performed by Gery Pray, MD. It was created on his behalf by Darcus Austin, a trained medical scribe. The creation of this record is based on the scribe's personal observations and the provider's statements to them. This document has been checked and approved by the attending provider.

## 2015-11-09 NOTE — Progress Notes (Signed)
Brian White is here for his first fraction of radiation to his Chest. He denies pain or fatigue. He has no shortness of breath with exertion. He will start Chemotherapy on Monday with Dr. Earlie Server. He is eating well per his report. He has no other concerns at this time.  BP 154/85 mmHg  Pulse 61  Temp(Src) 97.6 F (36.4 C)  Ht '5\' 11"'$  (1.803 m)  Wt 216 lb 1.6 oz (98.022 kg)  BMI 30.15 kg/m2  SpO2 100%   Wt Readings from Last 3 Encounters:  11/09/15 216 lb 1.6 oz (98.022 kg)  11/04/15 216 lb 3.2 oz (98.068 kg)  10/28/15 214 lb 4.8 oz (97.206 kg)

## 2015-11-10 ENCOUNTER — Encounter: Payer: Self-pay | Admitting: Pulmonary Disease

## 2015-11-10 ENCOUNTER — Telehealth: Payer: Self-pay | Admitting: Pulmonary Disease

## 2015-11-10 ENCOUNTER — Other Ambulatory Visit: Payer: BLUE CROSS/BLUE SHIELD

## 2015-11-10 ENCOUNTER — Ambulatory Visit (INDEPENDENT_AMBULATORY_CARE_PROVIDER_SITE_OTHER): Payer: BLUE CROSS/BLUE SHIELD | Admitting: Pulmonary Disease

## 2015-11-10 ENCOUNTER — Ambulatory Visit: Payer: BLUE CROSS/BLUE SHIELD

## 2015-11-10 ENCOUNTER — Ambulatory Visit
Admission: RE | Admit: 2015-11-10 | Discharge: 2015-11-10 | Disposition: A | Discharge status: Home / Self Care | Payer: BLUE CROSS/BLUE SHIELD | Source: Ambulatory Visit | Attending: Radiation Oncology | Admitting: Radiation Oncology

## 2015-11-10 VITALS — BP 134/68 | HR 53 | Ht 71.0 in | Wt 217.0 lb

## 2015-11-10 DIAGNOSIS — C3490 Malignant neoplasm of unspecified part of unspecified bronchus or lung: Secondary | ICD-10-CM | POA: Diagnosis not present

## 2015-11-10 DIAGNOSIS — J449 Chronic obstructive pulmonary disease, unspecified: Secondary | ICD-10-CM

## 2015-11-10 DIAGNOSIS — I2510 Atherosclerotic heart disease of native coronary artery without angina pectoris: Secondary | ICD-10-CM

## 2015-11-10 DIAGNOSIS — Z510 Encounter for antineoplastic radiation therapy: Secondary | ICD-10-CM | POA: Diagnosis not present

## 2015-11-10 NOTE — Progress Notes (Signed)
Subjective:    Patient ID: Brian White, male    DOB: January 23, 1951, 65 y.o.   MRN: [id number]  C.C.:  Follow-up for Severe COPD, Stage IIIB NSCLC (T1b, N3, M0), Tobacco Use, & Possible MS.  HPI Severe COPD:  He continues to cough and produces a clear phlegm. He does have occasional wheezing. Compliant with Symbicort. Baseline dyspnea on exertion.   NSCLC: Patient underwent bronchoscopy with mediastinal lymph node sampling by myself. Undergoing concurrent chemotherapy & radiation therapy. His first dose of XRT was the other day and without any complications. He is planned to start chemotherapy on Monday. He did have an US guided biopsy on Monday but the result is not available yet.  Tobacco Use:  He quit smoking this past weekend.   Possible MS:  Findings on MRI concerning but patient denies any history. Patient wants to wait until his chemotherapy and treatment for his cancer is complete before he undergoes a neurological evaluation.   Review of Systems No chest pain or pressure. No fever, chills, or sweats. No headaches or vision changes. No focal weakness, numbness, or tingling.   Allergies  Allergen Reactions  . Onion Anaphylaxis    Hives     Current Outpatient Prescriptions on File Prior to Visit  Medication Sig Dispense Refill  . aspirin 81 MG tablet Take 81 mg by mouth daily.      . budesonide-formoterol (SYMBICORT) 160-4.5 MCG/ACT inhaler Inhale 1 puff into the lungs 2 (two) times daily. 1 Inhaler 3  . cetirizine (ZYRTEC) 10 MG chewable tablet Chew 10 mg by mouth daily.    Marland Kitchen EPINEPHrine (EPIPEN) 0.3 mg/0.3 mL DEVI Inject 0.3 mLs (0.3 mg total) into the muscle once. 1 Device prn  . ibuprofen (ADVIL,MOTRIN) 200 MG tablet Take 600 mg by mouth every 6 (six) hours as needed (Pain). Reported on 11/04/2015    . losartan-hydrochlorothiazide (HYZAAR) 100-25 MG tablet Take 1 tablet by mouth daily. 90 tablet 3  . prochlorperazine (COMPAZINE) 10 MG tablet Take 1 tablet (10 mg total) by mouth  every 6 (six) hours as needed for nausea or vomiting. 30 tablet 0  . sildenafil (VIAGRA) 100 MG tablet Take 0.5-1 tablets (50-100 mg total) by mouth daily as needed for erectile dysfunction. 4 tablet 0   No current facility-administered medications on file prior to visit.    Past Medical History  Diagnosis Date  . Hypertension   . Arthritis   . CAD (coronary artery disease)     mild MI; pt unaware - seen on stress test  . H/O seasonal allergies   . H/O anaphylactic shock 2013    hives,hypotension,syncope  . Smoker   . ED (erectile dysfunction)   . Polycythemia 01/25/2014  . Allergy   . Thoracic aorta atherosclerosis ([place]) 10/08/2015    Mild by CT 09/2015   . COPD (chronic obstructive pulmonary disease) ([place]) 06/26/2011    Mild centrilobular and paraseptal emphysema by CT 09/2015   . Radiation 09/18/2011 through 11/10/2011    Prostate 7600 cGy 40 sessions, seminal vesicles 5600 cGy 40 sessions    . Shortness of breath dyspnea   . Prostate cancer [facility]) 2013    s/p radiation therapy March 2013, planned f/u with uro after XRT  . Adenocarcinoma, lung ([place]) 10/08/2015    2.4cm spiculated LLL nodule suspicious for primary bronchogenic carcinoma by CT 09/2015 - adenocarcinoma by endobronchial biopsy    Past Surgical History  Procedure Laterality Date  . Hernia repair Right 2005    with  mesh  . Insertion prostate radiation seed  08/2011    prostate cancer, [place]  . Colonoscopy w/ biopsies and polypectomy  10/11/15    mult TAs, 3cm rectal polypoid lesion; rec further treatment, diverticulosis ([place])  . Endobronchial ultrasound Bilateral 10/18/2015    Procedure: ENDOBRONCHIAL ULTRASOUND;  Surgeon: Javier Glazier, MD;  Location: [facility];  Service: Cardiopulmonary;  Laterality: Bilateral;    Family History  Problem Relation Age of Onset  . Asthma Father   . COPD Father   . CAD Father     possibly  . Emphysema Father   . Breast cancer Sister   .  Hypertension Mother   . Diabetes Neg Hx   . Stroke Neg Hx   . Colon cancer Neg Hx   . Colon polyps Neg Hx   . Rectal cancer Neg Hx   . Stomach cancer Neg Hx     Social History   Social History  . Marital Status: Married    Spouse Name: N/A  . Number of Children: Y  . Years of Education: N/A   Occupational History  . retired    Social History Main Topics  . Smoking status: Former Smoker -- 1.00 packs/day for 49 years    Types: Cigarettes    Start date: 01/16/1967    Quit date: 11/06/2015  . Smokeless tobacco: Never Used     Comment: Peak rate of 2ppd  . Alcohol Use: 0.6 oz/week    1 Shots of liquor per week     Comment: 2-3 beers or mixed drinks daily    . Drug Use: Yes    Special: Marijuana     Comment: marijuana occasional weekly  . Sexual Activity: Yes    Birth Control/ Protection: None   Other Topics Concern  . None   Social History Narrative   Lives with wife, dogs, chickens and goats   Occupation: retired.  On disability for herinated discs in back   Edu: HS   Activity: works outside [employer], takes care of farm animals   Diet: good water, fruits/vegetables daily      [facility]:   Originally from [place]. 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 No known asbestos exposure. Questionable mold exposure. Has 2 dogs currently. No recent bird exposure. Previously had chickens.       Objective:   Physical Exam BP 134/68 mmHg  Pulse 53  Ht '5\' 11"'$  (1.803 m)  Wt 217 lb (98.431 kg)  BMI 30.28 kg/m2  SpO2 98% General:  Awake. Alert. No acute distress.  Integument:  Warm & dry. No rash on exposed skin. Mild bruising over right supraclavicular lymph node biopsy area. Lymphatics:  No appreciated cervical or supraclavicular lymphadenoapthy. HEENT:  Moist mucus membranes. No oral ulcers. No scleral injection or icterus.  Cardiovascular:  Regular rate. Normal S1 & S2. No edema.  Pulmonary:   Mild coarse wheeze bilaterally. Good aeration bilaterally. Normal work of breathing on room air. Abdomen: Soft. Normal bowel sounds. Protuberant.   PFT 10/13/15: FVC 2.86 L (89%) FEV1 1.63 L (45%) FEV1/FVC 0.57 FEF 25-75 0.92 L (31%) negative bronchodilator response TLC 6.93 L (96%) RV 158% ERV 73% DLCO uncorrected 61%  IMAGING MRI BRAIN W/ & W/O 10/29/15 (per radiologist): No intracranial metastatic disease. Widespread areas of white matter signal abnormalities able to represent chronic multiple sclerosis. Generalized atrophy.  PET CT 10/13/15 (previously reviewed by me): Right hypermetabolic supraclavicular lymphadenopathy  with maximum SUV 8.5. 2.4 cm spiculated left lower lobe nodule with a max SUV 11.6. 2.8 cm right paratracheal lymph node with max SUV 20.2. Hypermetabolic left hilar lymph node with max SUV 16.1. Heart enlarged. No other hypermetabolic activity noted.  LD CT CHEST W/O 10/04/15 (previously reviewed by me): No pleural effusion or thickening. No pericardial effusion. 2.4x2.7 cm nodule in LLL on my measurements with precarinal lymph adenopathy that appears to be greater than 1cm in short axis.  PATHOLOGY EBUS FNA 4R (10/18/15): NSCLC favoring adenocarcinoma.  LABS 09/13/15 CBC: 6.9/17.9/52.1/142    Assessment & Plan:  65 year old male with newly diagnosed stage IIIB non-small cell lung cancer. Patient currently undergoing concurrent chemotherapy and radiation therapy. He seems to be tolerating his first dose of radiation therapy well. I did caution the patient that he will have increased sputum production as he recently quit tobacco use. I congratulated him on having completely quit smoking. We did discuss the results of his spirometry which did show severe COPD as well as his brain MRI which suggested possible multiple sclerosis. Patient has no personal or family history of multiple sclerosis or symptoms that would correlate with his disease. He wishes to hold on further testing  and/or referral at this time for MS until he completes treatment for his newly diagnosed lung cancer. I instructed the patient contact my office if he had any new breathing problems before his next appointment.  1. Severe COPD: Continuing Symbicort. Screening for alpha-1 antitrypsin deficiency. Repeat spirometry with bronchodilator challenge & DLCO next appointment. 2. Stage IIIB NSCLC: Currently undergoing concurrent chemotherapy & radiation therapy. Checking 6 minute walk test and spirometry with DLCO next appointment. 3. Possible multiple sclerosis: Patient wishes to defer further treatment and/or referral at this time until lung cancer treatment is complete. 4. Follow-up: Patient to return to clinic in 1-2 months or sooner if needed.  Sonia Baller Ashok Cordia, M.D. [facility] Pager:  [phone] After 3pm or if no response, call [phone] 11:15 AM 11/10/2015

## 2015-11-10 NOTE — Telephone Encounter (Signed)
PFT 10/13/15: FVC 2.86 L (89%) FEV1 1.63 L (45%) FEV1/FVC 0.57 FEF 25-75 0.92 L (31%) negative bronchodilator response TLC 6.93 L (96%) RV 158% ERV 73% DLCO uncorrected 61%  IMAGING MRI BRAIN W/ & W/O 10/29/15 (per radiologist): No intracranial metastatic disease. Widespread areas of white matter signal abnormalities able to represent chronic multiple sclerosis. Generalized atrophy.  PET CT 10/13/15 (previously reviewed by me): Right hypermetabolic supraclavicular lymphadenopathy with maximum SUV 8.5. 2.4 cm spiculated left lower lobe nodule with a max SUV 11.6. 2.8 cm right paratracheal lymph node with max SUV 20.2. Hypermetabolic left hilar lymph node with max SUV 16.1. Heart enlarged. No other hypermetabolic activity noted.  PATHOLOGY EBUS FNA 4R (10/18/15):  NSCLC favoring adenocarcinoma.

## 2015-11-10 NOTE — Addendum Note (Signed)
Encounter addended by: Jacqulyn Liner, RN on: 11/10/2015 11:27 AM<BR>     Documentation filed: Charges VN

## 2015-11-10 NOTE — Patient Instructions (Signed)
   Continue taking her Symbicort as prescribed  Please call me if you have any new breathing problems before your next appointment  We will do a breathing and walking test at your next appointment  I will see you back in 1-2 months or sooner if needed  TESTS ORDERED: 1. Alpha-1 antitrypsin phenotype today 2. 6 minute walk test at follow-up appointment  3. Spirometry with bronchodilator challenge and DLCO at next appointment

## 2015-11-11 ENCOUNTER — Ambulatory Visit: Payer: BLUE CROSS/BLUE SHIELD

## 2015-11-11 ENCOUNTER — Ambulatory Visit
Admission: RE | Admit: 2015-11-11 | Discharge: 2015-11-11 | Disposition: A | Discharge status: Home / Self Care | Payer: BLUE CROSS/BLUE SHIELD | Source: Ambulatory Visit | Attending: Radiation Oncology | Admitting: Radiation Oncology

## 2015-11-11 DIAGNOSIS — Z510 Encounter for antineoplastic radiation therapy: Secondary | ICD-10-CM | POA: Diagnosis not present

## 2015-11-11 DIAGNOSIS — C3432 Malignant neoplasm of lower lobe, left bronchus or lung: Secondary | ICD-10-CM | POA: Diagnosis not present

## 2015-11-12 ENCOUNTER — Other Ambulatory Visit: Payer: Self-pay | Admitting: *Deleted

## 2015-11-12 ENCOUNTER — Ambulatory Visit: Payer: BLUE CROSS/BLUE SHIELD

## 2015-11-12 ENCOUNTER — Ambulatory Visit: Admission: RE | Admit: 2015-11-12 | Payer: BLUE CROSS/BLUE SHIELD | Source: Ambulatory Visit

## 2015-11-12 ENCOUNTER — Ambulatory Visit
Admission: RE | Admit: 2015-11-12 | Discharge: 2015-11-12 | Disposition: A | Discharge status: Home / Self Care | Payer: BLUE CROSS/BLUE SHIELD | Source: Ambulatory Visit | Attending: Radiation Oncology | Admitting: Radiation Oncology

## 2015-11-12 ENCOUNTER — Telehealth: Payer: Self-pay | Admitting: Gastroenterology

## 2015-11-12 DIAGNOSIS — Z860100 Personal history of colon polyps, unspecified: Secondary | ICD-10-CM

## 2015-11-15 ENCOUNTER — Other Ambulatory Visit (HOSPITAL_BASED_OUTPATIENT_CLINIC_OR_DEPARTMENT_OTHER): Payer: BLUE CROSS/BLUE SHIELD

## 2015-11-15 ENCOUNTER — Ambulatory Visit: Payer: BLUE CROSS/BLUE SHIELD

## 2015-11-15 ENCOUNTER — Ambulatory Visit (HOSPITAL_BASED_OUTPATIENT_CLINIC_OR_DEPARTMENT_OTHER): Payer: BLUE CROSS/BLUE SHIELD

## 2015-11-15 ENCOUNTER — Ambulatory Visit
Admission: RE | Admit: 2015-11-15 | Discharge: 2015-11-15 | Disposition: A | Discharge status: Home / Self Care | Payer: BLUE CROSS/BLUE SHIELD | Source: Ambulatory Visit | Attending: Radiation Oncology | Admitting: Radiation Oncology

## 2015-11-15 ENCOUNTER — Other Ambulatory Visit: Payer: BLUE CROSS/BLUE SHIELD

## 2015-11-15 ENCOUNTER — Encounter: Payer: Self-pay | Admitting: *Deleted

## 2015-11-15 ENCOUNTER — Other Ambulatory Visit: Payer: Self-pay | Admitting: *Deleted

## 2015-11-15 ENCOUNTER — Encounter: Payer: Self-pay | Admitting: Internal Medicine

## 2015-11-15 VITALS — BP 133/87 | HR 62 | Temp 98.0°F | Resp 16

## 2015-11-15 DIAGNOSIS — Z5111 Encounter for antineoplastic chemotherapy: Secondary | ICD-10-CM | POA: Diagnosis not present

## 2015-11-15 DIAGNOSIS — Z510 Encounter for antineoplastic radiation therapy: Secondary | ICD-10-CM | POA: Diagnosis not present

## 2015-11-15 DIAGNOSIS — C3432 Malignant neoplasm of lower lobe, left bronchus or lung: Secondary | ICD-10-CM

## 2015-11-15 LAB — CBC WITH DIFFERENTIAL/PLATELET
BASO%: 0.2 % (ref 0.0–2.0)
Basophils Absolute: 0 10*3/uL (ref 0.0–0.1)
EOS%: 1.5 % (ref 0.0–7.0)
Eosinophils Absolute: 0.1 10*3/uL (ref 0.0–0.5)
HCT: 49 % (ref 38.4–49.9)
HGB: 17.2 g/dL — ABNORMAL HIGH (ref 13.0–17.1)
LYMPH%: 28 % (ref 14.0–49.0)
MCH: 36.3 pg — ABNORMAL HIGH (ref 27.2–33.4)
MCHC: 35.1 g/dL (ref 32.0–36.0)
MCV: 103.4 fL — ABNORMAL HIGH (ref 79.3–98.0)
MONO#: 0.5 10*3/uL (ref 0.1–0.9)
MONO%: 10.4 % (ref 0.0–14.0)
NEUT#: 3.1 10*3/uL (ref 1.5–6.5)
NEUT%: 59.9 % (ref 39.0–75.0)
Platelets: 123 10*3/uL — ABNORMAL LOW (ref 140–400)
RBC: 4.74 10*6/uL (ref 4.20–5.82)
RDW: 14.1 % (ref 11.0–14.6)
WBC: 5.2 10*3/uL (ref 4.0–10.3)
lymph#: 1.5 10*3/uL (ref 0.9–3.3)

## 2015-11-15 LAB — COMPREHENSIVE METABOLIC PANEL
ALT: 20 U/L (ref 0–55)
AST: 27 U/L (ref 5–34)
Albumin: 3.9 g/dL (ref 3.5–5.0)
Alkaline Phosphatase: 61 U/L (ref 40–150)
Anion Gap: 10 mEq/L (ref 3–11)
BUN: 19.6 mg/dL (ref 7.0–26.0)
CO2: 26 mEq/L (ref 22–29)
Calcium: 9.3 mg/dL (ref 8.4–10.4)
Chloride: 105 mEq/L (ref 98–109)
Creatinine: 0.8 mg/dL (ref 0.7–1.3)
EGFR: >90 mL/min/{1.73_m2} (ref >90)
Glucose: 91 mg/dl (ref 70–140)
Potassium: 4.1 mEq/L (ref 3.5–5.1)
Sodium: 141 mEq/L (ref 136–145)
Total Bilirubin: 1.2 mg/dL (ref 0.20–1.20)
Total Protein: 7 g/dL (ref 6.4–8.3)

## 2015-11-15 MED ORDER — SODIUM CHLORIDE 0.9 % IV SOLN
45.0000 mg/m2 | Freq: Once | INTRAVENOUS | Status: AC
  Administered 2015-11-15: 102 mg via INTRAVENOUS
  Filled 2015-11-15: qty 17

## 2015-11-15 MED ORDER — PALONOSETRON HCL INJECTION 0.25 MG/5ML
INTRAVENOUS | Status: AC
  Filled 2015-11-15: qty 5

## 2015-11-15 MED ORDER — SODIUM CHLORIDE 0.9 % IV SOLN
Freq: Once | INTRAVENOUS | Status: AC
  Administered 2015-11-15: 12:00:00 via INTRAVENOUS

## 2015-11-15 MED ORDER — FAMOTIDINE IN NACL 20-0.9 MG/50ML-% IV SOLN
INTRAVENOUS | Status: AC
  Filled 2015-11-15: qty 50

## 2015-11-15 MED ORDER — FAMOTIDINE IN NACL 20-0.9 MG/50ML-% IV SOLN
20.0000 mg | Freq: Once | INTRAVENOUS | Status: AC
  Administered 2015-11-15: 20 mg via INTRAVENOUS

## 2015-11-15 MED ORDER — RADIAPLEXRX EX GEL
Freq: Once | CUTANEOUS | Status: AC
  Administered 2015-11-15: 09:00:00 via TOPICAL

## 2015-11-15 MED ORDER — SODIUM CHLORIDE 0.9 % IV SOLN
20.0000 mg | Freq: Once | INTRAVENOUS | Status: AC
  Administered 2015-11-15: 20 mg via INTRAVENOUS
  Filled 2015-11-15: qty 2

## 2015-11-15 MED ORDER — PALONOSETRON HCL INJECTION 0.25 MG/5ML
0.2500 mg | Freq: Once | INTRAVENOUS | Status: AC
  Administered 2015-11-15: 0.25 mg via INTRAVENOUS

## 2015-11-15 MED ORDER — DIPHENHYDRAMINE HCL 50 MG/ML IJ SOLN
50.0000 mg | Freq: Once | INTRAMUSCULAR | Status: AC
  Administered 2015-11-15: 50 mg via INTRAVENOUS

## 2015-11-15 MED ORDER — SODIUM CHLORIDE 0.9 % IV SOLN
250.0000 mg | Freq: Once | INTRAVENOUS | Status: AC
  Administered 2015-11-15: 250 mg via INTRAVENOUS
  Filled 2015-11-15: qty 25

## 2015-11-15 MED ORDER — DIPHENHYDRAMINE HCL 50 MG/ML IJ SOLN
INTRAMUSCULAR | Status: AC
  Filled 2015-11-15: qty 1

## 2015-11-15 NOTE — Progress Notes (Signed)
Introduced myself as his FA. Informed him that unfortunately there aren't any foundations offering copay assistance for his Dx. Pt doesn't need assistance w/ transportation or personal bills at this time. He has my card for any questions or concerns he may have in the future.

## 2015-11-15 NOTE — Progress Notes (Signed)
Pt here for patient teaching.  Pt given Radiation and You booklet and Radiaplex gel. Reviewed areas of pertinence such as fatigue, skin changes, throat changes and cough . Pt able to give teach back of to pat skin and use unscented/gentle soap,apply Radiaplex bid and avoid applying anything to skin within 4 hours of treatment. Pt demonstrated understanding and verbalizes understanding of information given and will contact nursing with any questions or concerns.

## 2015-11-15 NOTE — Patient Instructions (Signed)
[facility] Discharge Instructions for Patients Receiving Chemotherapy  Today you received the following chemotherapy agents:  Taxol and Carboplatin.  To help prevent nausea and vomiting after your treatment, we encourage you to take your nausea medication as prescribed.   If you develop nausea and vomiting that is not controlled by your nausea medication, call the clinic.   BELOW ARE SYMPTOMS THAT SHOULD BE REPORTED IMMEDIATELY:  *FEVER GREATER THAN 100.5 F  *CHILLS WITH OR WITHOUT FEVER  NAUSEA AND VOMITING THAT IS NOT CONTROLLED WITH YOUR NAUSEA MEDICATION  *UNUSUAL SHORTNESS OF BREATH  *UNUSUAL BRUISING OR BLEEDING  TENDERNESS IN MOUTH AND THROAT WITH OR WITHOUT PRESENCE OF ULCERS  *URINARY PROBLEMS  *BOWEL PROBLEMS  UNUSUAL RASH Items with * indicate a potential emergency and should be followed up as soon as possible.  Feel free to call the clinic you have any questions or concerns. The clinic phone number is [phone].  Please show the [facility] at check-in to the Emergency Department and triage nurse.  Paclitaxel injection What is this medicine? PACLITAXEL (PAK li TAX el) is a chemotherapy drug. It targets fast dividing cells, like cancer cells, and causes these cells to die. This medicine is used to treat ovarian cancer, breast cancer, and other cancers. This medicine may be used for other purposes; ask your health care provider or pharmacist if you have questions. What should I tell my health care provider before I take this medicine? They need to know if you have any of these conditions: -blood disorders -irregular heartbeat -infection (especially a virus infection such as chickenpox, cold sores, or herpes) -liver disease -previous or ongoing radiation therapy -an unusual or allergic reaction to paclitaxel, alcohol, polyoxyethylated castor oil, other chemotherapy agents, other medicines, foods, dyes, or preservatives -pregnant or  trying to get pregnant -breast-feeding How should I use this medicine? This drug is given as an infusion into a vein. It is administered in a hospital or clinic by a specially trained health care professional. Talk to your pediatrician regarding the use of this medicine in children. Special care may be needed. Overdosage: If you think you have taken too much of this medicine contact a poison control center or emergency room at once. NOTE: This medicine is only for you. Do not share this medicine with others. What if I miss a dose? It is important not to miss your dose. Call your doctor or health care professional if you are unable to keep an appointment. What may interact with this medicine? Do not take this medicine with any of the following medications: -disulfiram -metronidazole This medicine may also interact with the following medications: -cyclosporine -diazepam -ketoconazole -medicines to increase blood counts like filgrastim, pegfilgrastim, sargramostim -other chemotherapy drugs like cisplatin, doxorubicin, epirubicin, etoposide, teniposide, vincristine -quinidine -testosterone -vaccines -verapamil Talk to your doctor or health care professional before taking any of these medicines: -acetaminophen -aspirin -ibuprofen -ketoprofen -naproxen This list may not describe all possible interactions. Give your health care provider a list of all the medicines, herbs, non-prescription drugs, or dietary supplements you use. Also tell them if you smoke, drink alcohol, or use illegal drugs. Some items may interact with your medicine. What should I watch for while using this medicine? Your condition will be monitored carefully while you are receiving this medicine. You will need important blood work done while you are taking this medicine. This drug may make you feel generally unwell. This is not uncommon, as chemotherapy can affect healthy cells as  well as cancer cells. Report any side  effects. Continue your course of treatment even though you feel ill unless your doctor tells you to stop. This medicine can cause serious allergic reactions. To reduce your risk you will need to take other medicine(s) before treatment with this medicine. In some cases, you may be given additional medicines to help with side effects. Follow all directions for their use. Call your doctor or health care professional for advice if you get a fever, chills or sore throat, or other symptoms of a cold or flu. Do not treat yourself. This drug decreases your body's ability to fight infections. Try to avoid being around people who are sick. This medicine may increase your risk to bruise or bleed. Call your doctor or health care professional if you notice any unusual bleeding. Be careful brushing and flossing your teeth or using a toothpick because you may get an infection or bleed more easily. If you have any dental work done, tell your dentist you are receiving this medicine. Avoid taking products that contain aspirin, acetaminophen, ibuprofen, naproxen, or ketoprofen unless instructed by your doctor. These medicines may hide a fever. Do not become pregnant while taking this medicine. Women should inform their doctor if they wish to become pregnant or think they might be pregnant. There is a potential for serious side effects to an unborn child. Talk to your health care professional or pharmacist for more information. Do not breast-feed an infant while taking this medicine. Men are advised not to father a child while receiving this medicine. This product may contain alcohol. Ask your pharmacist or healthcare provider if this medicine contains alcohol. Be sure to tell all healthcare providers you are taking this medicine. Certain medicines, like metronidazole and disulfiram, can cause an unpleasant reaction when taken with alcohol. The reaction includes flushing, headache, nausea, vomiting, sweating, and increased  thirst. The reaction can last from 30 minutes to several hours. What side effects may I notice from receiving this medicine? Side effects that you should report to your doctor or health care professional as soon as possible: -allergic reactions like skin rash, itching or hives, swelling of the face, lips, or tongue -low blood counts - This drug may decrease the number of white blood cells, red blood cells and platelets. You may be at increased risk for infections and bleeding. -signs of infection - fever or chills, cough, sore throat, pain or difficulty passing urine -signs of decreased platelets or bleeding - bruising, pinpoint red spots on the skin, black, tarry stools, nosebleeds -signs of decreased red blood cells - unusually weak or tired, fainting spells, lightheadedness -breathing problems -chest pain -high or low blood pressure -mouth sores -nausea and vomiting -pain, swelling, redness or irritation at the injection site -pain, tingling, numbness in the hands or feet -slow or irregular heartbeat -swelling of the ankle, feet, hands Side effects that usually do not require medical attention (report to your doctor or health care professional if they continue or are bothersome): -bone pain -complete hair loss including hair on your head, underarms, pubic hair, eyebrows, and eyelashes -changes in the color of fingernails -diarrhea -loosening of the fingernails -loss of appetite -muscle or joint pain -red flush to skin -sweating This list may not describe all possible side effects. Call your doctor for medical advice about side effects. You may report side effects to FDA at 1-800-FDA-1088. Where should I keep my medicine? This drug is given in a hospital or clinic and will not be stored  at home. NOTE: This sheet is a summary. It may not cover all possible information. If you have questions about this medicine, talk to your doctor, pharmacist, or health care provider.    2016,  Elsevier/Gold Standard. (2015-02-18 13:02:56)  Carboplatin injection What is this medicine? CARBOPLATIN (KAR boe pla tin) is a chemotherapy drug. It targets fast dividing cells, like cancer cells, and causes these cells to die. This medicine is used to treat ovarian cancer and many other cancers. This medicine may be used for other purposes; ask your health care provider or pharmacist if you have questions. What should I tell my health care provider before I take this medicine? They need to know if you have any of these conditions: -blood disorders -hearing problems -kidney disease -recent or ongoing radiation therapy -an unusual or allergic reaction to carboplatin, cisplatin, other chemotherapy, other medicines, foods, dyes, or preservatives -pregnant or trying to get pregnant -breast-feeding How should I use this medicine? This drug is usually given as an infusion into a vein. It is administered in a hospital or clinic by a specially trained health care professional. Talk to your pediatrician regarding the use of this medicine in children. Special care may be needed. Overdosage: If you think you have taken too much of this medicine contact a poison control center or emergency room at once. NOTE: This medicine is only for you. Do not share this medicine with others. What if I miss a dose? It is important not to miss a dose. Call your doctor or health care professional if you are unable to keep an appointment. What may interact with this medicine? -medicines for seizures -medicines to increase blood counts like filgrastim, pegfilgrastim, sargramostim -some antibiotics like amikacin, gentamicin, neomycin, streptomycin, tobramycin -vaccines Talk to your doctor or health care professional before taking any of these medicines: -acetaminophen -aspirin -ibuprofen -ketoprofen -naproxen This list may not describe all possible interactions. Give your health care provider a list of all the  medicines, herbs, non-prescription drugs, or dietary supplements you use. Also tell them if you smoke, drink alcohol, or use illegal drugs. Some items may interact with your medicine. What should I watch for while using this medicine? Your condition will be monitored carefully while you are receiving this medicine. You will need important blood work done while you are taking this medicine. This drug may make you feel generally unwell. This is not uncommon, as chemotherapy can affect healthy cells as well as cancer cells. Report any side effects. Continue your course of treatment even though you feel ill unless your doctor tells you to stop. In some cases, you may be given additional medicines to help with side effects. Follow all directions for their use. Call your doctor or health care professional for advice if you get a fever, chills or sore throat, or other symptoms of a cold or flu. Do not treat yourself. This drug decreases your body's ability to fight infections. Try to avoid being around people who are sick. This medicine may increase your risk to bruise or bleed. Call your doctor or health care professional if you notice any unusual bleeding. Be careful brushing and flossing your teeth or using a toothpick because you may get an infection or bleed more easily. If you have any dental work done, tell your dentist you are receiving this medicine. Avoid taking products that contain aspirin, acetaminophen, ibuprofen, naproxen, or ketoprofen unless instructed by your doctor. These medicines may hide a fever. Do not become pregnant while taking this  medicine. Women should inform their doctor if they wish to become pregnant or think they might be pregnant. There is a potential for serious side effects to an unborn child. Talk to your health care professional or pharmacist for more information. Do not breast-feed an infant while taking this medicine. What side effects may I notice from receiving this  medicine? Side effects that you should report to your doctor or health care professional as soon as possible: -allergic reactions like skin rash, itching or hives, swelling of the face, lips, or tongue -signs of infection - fever or chills, cough, sore throat, pain or difficulty passing urine -signs of decreased platelets or bleeding - bruising, pinpoint red spots on the skin, black, tarry stools, nosebleeds -signs of decreased red blood cells - unusually weak or tired, fainting spells, lightheadedness -breathing problems -changes in hearing -changes in vision -chest pain -high blood pressure -low blood counts - This drug may decrease the number of white blood cells, red blood cells and platelets. You may be at increased risk for infections and bleeding. -nausea and vomiting -pain, swelling, redness or irritation at the injection site -pain, tingling, numbness in the hands or feet -problems with balance, talking, walking -trouble passing urine or change in the amount of urine Side effects that usually do not require medical attention (report to your doctor or health care professional if they continue or are bothersome): -hair loss -loss of appetite -metallic taste in the mouth or changes in taste This list may not describe all possible side effects. Call your doctor for medical advice about side effects. You may report side effects to FDA at 1-800-FDA-1088. Where should I keep my medicine? This drug is given in a hospital or clinic and will not be stored at home. NOTE: This sheet is a summary. It may not cover all possible information. If you have questions about this medicine, talk to your doctor, pharmacist, or health care provider.    2016, Elsevier/Gold Standard. (2007-10-08 14:38:05)

## 2015-11-15 NOTE — Progress Notes (Unsigned)
Reviewed side effects of Taxol and [place] with patient and his wife in chemo class today.  See education flowsheet

## 2015-11-16 ENCOUNTER — Ambulatory Visit: Payer: BLUE CROSS/BLUE SHIELD

## 2015-11-16 ENCOUNTER — Ambulatory Visit
Admission: RE | Admit: 2015-11-16 | Discharge: 2015-11-16 | Disposition: A | Discharge status: Home / Self Care | Payer: BLUE CROSS/BLUE SHIELD | Source: Ambulatory Visit | Attending: Radiation Oncology | Admitting: Radiation Oncology

## 2015-11-16 ENCOUNTER — Other Ambulatory Visit: Payer: Self-pay | Admitting: *Deleted

## 2015-11-16 ENCOUNTER — Encounter: Payer: Self-pay | Admitting: Radiation Oncology

## 2015-11-16 VITALS — BP 124/69 | HR 55 | Temp 98.0°F | Ht 71.0 in | Wt 222.8 lb

## 2015-11-16 DIAGNOSIS — Z86010 Personal history of colonic polyps: Secondary | ICD-10-CM

## 2015-11-16 DIAGNOSIS — Z510 Encounter for antineoplastic radiation therapy: Secondary | ICD-10-CM | POA: Diagnosis not present

## 2015-11-16 DIAGNOSIS — C3432 Malignant neoplasm of lower lobe, left bronchus or lung: Secondary | ICD-10-CM | POA: Diagnosis not present

## 2015-11-16 NOTE — Progress Notes (Signed)
  Radiation Oncology         [phone]) [phone] ________________________________  Name: Brian White MRN: [id number]  Date: 11/16/2015  DOB: 1950/12/20  Weekly Radiation Therapy Management    ICD-9-CM ICD-10-CM   1. Cancer of lower lobe of left lung (HCC) 162.5 C34.32     Current Dose: 10 Gy     Planned Dose:  60 Gy  Narrative . . . . . . . . The patient presents for routine under treatment assessment.                     Brian White has completed 5 fractions to his chest. He denies pain or shortness of breath. He reports having an occasional dry cough and some wheezing. He reports he stopped smoking 2 weeks ago. He denies having a sore throat or trouble swallowing. He had chemotherapy yesterday. He denies having fatigue. The patient had a biopsy of a right supraclavicular lymph node on 11/08/15. 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                                 Set-up films were reviewed.                                 The chart was checked. Physical Findings. . .  height is '5\' 11"'$  (1.803 m) and weight is 222 lb 12.8 oz (101.061 kg). His oral temperature is 98 F (36.7 C). His blood pressure is 124/69 and his pulse is 55. His oxygen saturation is 99%. . Weight essentially stable.  No significant changes.  The lungs are clear. The heart has a regular rhythm and rate. Impression . . . . . . . The patient is tolerating radiation. Plan . . . . . . . . . . . . Continue treatment as planned. ________________________________   Blair Promise, PhD, MD  This document serves as a record of services personally performed by Gery Pray, MD. It was created on his behalf by Darcus Austin, a trained medical scribe. The creation of this record is based on the scribe's personal observations and the provider's statements to them. This document has been checked and approved by the attending provider.

## 2015-11-16 NOTE — Progress Notes (Signed)
Brian White has completed 5 fractions to his chest.  He denies pain and shortness of breath.  He reports having an occasional dry cough.  He reports he stopped smoking 2 weeks ago.  He denies having a sore throat or trouble swallowing.  He had chemotherapy yesterday.  He denies having fatigue.  BP 124/69 mmHg  Pulse 55  Temp(Src) 98 F (36.7 C) (Oral)  Ht '5\' 11"'$  (1.803 m)  Wt 222 lb 12.8 oz (101.061 kg)  BMI 31.09 kg/m2  SpO2 99% Wt Readings from Last 3 Encounters:  11/16/15 222 lb 12.8 oz (101.061 kg)  11/10/15 217 lb (98.431 kg)  11/09/15 216 lb 1.6 oz (98.022 kg)

## 2015-11-17 ENCOUNTER — Ambulatory Visit: Payer: BLUE CROSS/BLUE SHIELD

## 2015-11-17 ENCOUNTER — Ambulatory Visit
Admission: RE | Admit: 2015-11-17 | Discharge: 2015-11-17 | Disposition: A | Discharge status: Home / Self Care | Payer: BLUE CROSS/BLUE SHIELD | Source: Ambulatory Visit | Attending: Radiation Oncology | Admitting: Radiation Oncology

## 2015-11-17 DIAGNOSIS — Z510 Encounter for antineoplastic radiation therapy: Secondary | ICD-10-CM | POA: Diagnosis not present

## 2015-11-17 DIAGNOSIS — C3432 Malignant neoplasm of lower lobe, left bronchus or lung: Secondary | ICD-10-CM | POA: Diagnosis not present

## 2015-11-18 ENCOUNTER — Ambulatory Visit
Admission: RE | Admit: 2015-11-18 | Discharge: 2015-11-18 | Disposition: A | Discharge status: Home / Self Care | Payer: BLUE CROSS/BLUE SHIELD | Source: Ambulatory Visit | Attending: Radiation Oncology | Admitting: Radiation Oncology

## 2015-11-18 ENCOUNTER — Ambulatory Visit: Payer: BLUE CROSS/BLUE SHIELD

## 2015-11-18 DIAGNOSIS — C3432 Malignant neoplasm of lower lobe, left bronchus or lung: Secondary | ICD-10-CM | POA: Diagnosis not present

## 2015-11-18 DIAGNOSIS — Z510 Encounter for antineoplastic radiation therapy: Secondary | ICD-10-CM | POA: Diagnosis not present

## 2015-11-18 LAB — ALPHA-1 ANTITRYPSIN PHENOTYPE: A-1 Antitrypsin: 161 mg/dL (ref 83–199)

## 2015-11-19 ENCOUNTER — Ambulatory Visit: Payer: BLUE CROSS/BLUE SHIELD

## 2015-11-19 ENCOUNTER — Ambulatory Visit
Admission: RE | Admit: 2015-11-19 | Discharge: 2015-11-19 | Disposition: A | Discharge status: Home / Self Care | Payer: BLUE CROSS/BLUE SHIELD | Source: Ambulatory Visit | Attending: Radiation Oncology | Admitting: Radiation Oncology

## 2015-11-19 DIAGNOSIS — C3432 Malignant neoplasm of lower lobe, left bronchus or lung: Secondary | ICD-10-CM | POA: Diagnosis not present

## 2015-11-19 DIAGNOSIS — Z510 Encounter for antineoplastic radiation therapy: Secondary | ICD-10-CM | POA: Diagnosis not present

## 2015-11-22 ENCOUNTER — Telehealth: Payer: Self-pay | Admitting: Internal Medicine

## 2015-11-22 ENCOUNTER — Other Ambulatory Visit (HOSPITAL_BASED_OUTPATIENT_CLINIC_OR_DEPARTMENT_OTHER): Payer: BLUE CROSS/BLUE SHIELD

## 2015-11-22 ENCOUNTER — Encounter: Payer: Self-pay | Admitting: Oncology

## 2015-11-22 ENCOUNTER — Encounter: Payer: Self-pay | Admitting: *Deleted

## 2015-11-22 ENCOUNTER — Ambulatory Visit (HOSPITAL_BASED_OUTPATIENT_CLINIC_OR_DEPARTMENT_OTHER): Payer: BLUE CROSS/BLUE SHIELD

## 2015-11-22 ENCOUNTER — Ambulatory Visit (HOSPITAL_BASED_OUTPATIENT_CLINIC_OR_DEPARTMENT_OTHER): Payer: BLUE CROSS/BLUE SHIELD | Admitting: Oncology

## 2015-11-22 ENCOUNTER — Ambulatory Visit: Payer: BLUE CROSS/BLUE SHIELD

## 2015-11-22 ENCOUNTER — Telehealth: Payer: Self-pay | Admitting: Oncology

## 2015-11-22 ENCOUNTER — Ambulatory Visit
Admission: RE | Admit: 2015-11-22 | Discharge: 2015-11-22 | Disposition: A | Discharge status: Home / Self Care | Payer: BLUE CROSS/BLUE SHIELD | Source: Ambulatory Visit | Attending: Radiation Oncology | Admitting: Radiation Oncology

## 2015-11-22 VITALS — BP 128/59 | HR 57 | Temp 97.8°F | Resp 18 | Ht 71.0 in | Wt 224.1 lb

## 2015-11-22 DIAGNOSIS — C3432 Malignant neoplasm of lower lobe, left bronchus or lung: Secondary | ICD-10-CM

## 2015-11-22 DIAGNOSIS — C7A8 Other malignant neuroendocrine tumors: Secondary | ICD-10-CM | POA: Diagnosis not present

## 2015-11-22 DIAGNOSIS — Z510 Encounter for antineoplastic radiation therapy: Secondary | ICD-10-CM | POA: Diagnosis not present

## 2015-11-22 DIAGNOSIS — Z5111 Encounter for antineoplastic chemotherapy: Secondary | ICD-10-CM | POA: Diagnosis not present

## 2015-11-22 LAB — COMPREHENSIVE METABOLIC PANEL
ALT: 29 U/L (ref 0–55)
AST: 24 U/L (ref 5–34)
Albumin: 3.9 g/dL (ref 3.5–5.0)
Alkaline Phosphatase: 57 U/L (ref 40–150)
Anion Gap: 8 mEq/L (ref 3–11)
BUN: 16.1 mg/dL (ref 7.0–26.0)
CO2: 30 mEq/L — ABNORMAL HIGH (ref 22–29)
Calcium: 10.2 mg/dL (ref 8.4–10.4)
Chloride: 103 mEq/L (ref 98–109)
Creatinine: 0.8 mg/dL (ref 0.7–1.3)
EGFR: >90 mL/min/{1.73_m2} (ref >90)
Glucose: 74 mg/dl (ref 70–140)
Potassium: 5.1 mEq/L (ref 3.5–5.1)
Sodium: 141 mEq/L (ref 136–145)
Total Bilirubin: 0.85 mg/dL (ref 0.20–1.20)
Total Protein: 7 g/dL (ref 6.4–8.3)

## 2015-11-22 LAB — CBC WITH DIFFERENTIAL/PLATELET
BASO%: 0.7 % (ref 0.0–2.0)
Basophils Absolute: 0 10*3/uL (ref 0.0–0.1)
EOS%: 2.3 % (ref 0.0–7.0)
Eosinophils Absolute: 0.1 10*3/uL (ref 0.0–0.5)
HCT: 49.8 % (ref 38.4–49.9)
HGB: 16.7 g/dL (ref 13.0–17.1)
LYMPH%: 23.1 % (ref 14.0–49.0)
MCH: 35.7 pg — ABNORMAL HIGH (ref 27.2–33.4)
MCHC: 33.6 g/dL (ref 32.0–36.0)
MCV: 106.3 fL — ABNORMAL HIGH (ref 79.3–98.0)
MONO#: 0.4 10*3/uL (ref 0.1–0.9)
MONO%: 8.6 % (ref 0.0–14.0)
NEUT#: 2.7 10*3/uL (ref 1.5–6.5)
NEUT%: 65.3 % (ref 39.0–75.0)
Platelets: 106 10*3/uL — ABNORMAL LOW (ref 140–400)
RBC: 4.69 10*6/uL (ref 4.20–5.82)
RDW: 14.3 % (ref 11.0–14.6)
WBC: 4.1 10*3/uL (ref 4.0–10.3)
lymph#: 1 10*3/uL (ref 0.9–3.3)

## 2015-11-22 MED ORDER — SODIUM CHLORIDE 0.9 % IV SOLN
Freq: Once | INTRAVENOUS | Status: AC
  Administered 2015-11-22: 11:00:00 via INTRAVENOUS

## 2015-11-22 MED ORDER — SODIUM CHLORIDE 0.9 % IV SOLN
45.0000 mg/m2 | Freq: Once | INTRAVENOUS | Status: AC
  Administered 2015-11-22: 102 mg via INTRAVENOUS
  Filled 2015-11-22: qty 17

## 2015-11-22 MED ORDER — FAMOTIDINE IN NACL 20-0.9 MG/50ML-% IV SOLN
20.0000 mg | Freq: Once | INTRAVENOUS | Status: AC
  Administered 2015-11-22: 20 mg via INTRAVENOUS

## 2015-11-22 MED ORDER — PALONOSETRON HCL INJECTION 0.25 MG/5ML
0.2500 mg | Freq: Once | INTRAVENOUS | Status: AC
  Administered 2015-11-22: 0.25 mg via INTRAVENOUS

## 2015-11-22 MED ORDER — DIPHENHYDRAMINE HCL 50 MG/ML IJ SOLN
50.0000 mg | Freq: Once | INTRAMUSCULAR | Status: AC
  Administered 2015-11-22: 50 mg via INTRAVENOUS

## 2015-11-22 MED ORDER — SODIUM CHLORIDE 0.9 % IV SOLN
250.0000 mg | Freq: Once | INTRAVENOUS | Status: AC
  Administered 2015-11-22: 250 mg via INTRAVENOUS
  Filled 2015-11-22: qty 25

## 2015-11-22 MED ORDER — PALONOSETRON HCL INJECTION 0.25 MG/5ML
INTRAVENOUS | Status: AC
  Filled 2015-11-22: qty 5

## 2015-11-22 MED ORDER — DIPHENHYDRAMINE HCL 50 MG/ML IJ SOLN
INTRAMUSCULAR | Status: AC
  Filled 2015-11-22: qty 1

## 2015-11-22 MED ORDER — FAMOTIDINE IN NACL 20-0.9 MG/50ML-% IV SOLN
INTRAVENOUS | Status: AC
  Filled 2015-11-22: qty 50

## 2015-11-22 MED ORDER — SODIUM CHLORIDE 0.9 % IV SOLN
20.0000 mg | Freq: Once | INTRAVENOUS | Status: AC
  Administered 2015-11-22: 20 mg via INTRAVENOUS
  Filled 2015-11-22: qty 2

## 2015-11-22 NOTE — Patient Instructions (Signed)
Constipation: Begin docusate 100 mg twice a day. If this does not completely work, add MiraLax 1 capful mixed in water or juice daily.  Cough: Try cough drops. If not effective, call our office.

## 2015-11-22 NOTE — Progress Notes (Signed)
Oncology Nurse Navigator Documentation  Oncology Nurse Navigator Flowsheets 11/22/2015  Navigator Location [facility]  Navigator Encounter Type Clinic/MDC  Patient Visit Type MedOnc  Treatment Phase Treatment  Barriers/Navigation Needs Coordination of Care;Education  Education Pain/ Symptom Management  Interventions Education Method;Coordination of Care-referral to neurology  Coordination of Care Appts  Education Method Verbal  Acuity Level 1  Acuity Level 1 Minimal follow up required  Time Spent with Patient 30   1. Educational Needs: Yes Understanding Treatment:  Patient had complaints of some mild constipation.  Instructed patient on how to help with issue.    2. Nutritional Needs: Yes Referral Nutritionist No. Instructed patient on high fiber diet  3. Physical Activity Needs: No. Patient states no issues   4. Support System Needs: No.   5. Transportation Needs:No.

## 2015-11-22 NOTE — Progress Notes (Signed)
Oncology Nurse Navigator Documentation  Oncology Nurse Navigator Flowsheets 11/22/2015  Navigator Location [facility]  Navigator Encounter Type Diagnostic Results  Patient Visit Type Other  Treatment Phase Other  Barriers/Navigation Needs Coordination of Care  Interventions Coordination of Care  Coordination of Care Other  Acuity Level 2  Acuity Level 2 Other  Time Spent with Patient 15   Per Dr. Julien Nordmann, I notified [facility] pathology dept to complete PDL 1 test.

## 2015-11-22 NOTE — Telephone Encounter (Signed)
Authorized referrral, [place] will be reaching out to pt

## 2015-11-22 NOTE — Progress Notes (Signed)
[place] VISIT PROGRESS NOTE  [name], MD [place] [address]  DIAGNOSIS: This is a 65 year old white male diagnosed with stage III B (T1b, N3, M0) non-small cell lung cancer favoring adenocarcinoma presented with left lower lobe pulmonary nodule in addition to left hilar and bilateral mediastinal lymphadenopathy in addition to right supraclavicular lymph nodes diagnosed in April 2017.  The patient also has a history of early-stage prostate cancer status post radiotherapy and currently on observation by his urologist.  PRIOR THERAPY: None  CURRENT THERAPY: Concurrent chemoradiation with weekly carboplatin for AUC of 2 and paclitaxel 45 mg/M2 started on 11/15/2015. He is status post 1 cycle.  INTERVAL HISTORY: Brian White 65 y.o. male returns for routine visit for followup of lung cancer with his wife. He received his first dose of chemotherapy last week and overall tolerated it well. He does report an ongoing cough without hemoptysis. He has occasional clear mucus that he coughs up. He also reports constipation for the past 3-4 days and has not tried anything for this. He denies fevers, chills, abdominal pain, nausea, vomiting. Appetite has been good and he is gaining weight. He denies dizziness and neuropathy. He has talked to his primary care provider regarding a referral to neurology for probable MS on his MRI. It was recommended that he wait until after his treatment was over before pursuing this referral. The patient is here today to discuss his biopsy results and to proceed with cycle 2 of his chemotherapy.  MEDICAL HISTORY: Past Medical History  Diagnosis Date  . Hypertension   . Arthritis   . CAD (coronary artery disease)     mild MI; pt unaware - seen on stress test  . H/O seasonal allergies   . H/O anaphylactic shock 2013    hives,hypotension,syncope  . Smoker   . ED (erectile dysfunction)   . Polycythemia 01/25/2014  .  Allergy   . Thoracic aorta atherosclerosis ([place]) 10/08/2015    Mild by CT 09/2015   . COPD (chronic obstructive pulmonary disease) ([place]) 06/26/2011    Mild centrilobular and paraseptal emphysema by CT 09/2015   . Radiation 09/18/2011 through 11/10/2011    Prostate 7600 cGy 40 sessions, seminal vesicles 5600 cGy 40 sessions    . Shortness of breath dyspnea   . Prostate cancer [facility]) 2013    s/p radiation therapy March 2013, planned f/u with uro after XRT  . Adenocarcinoma, lung ([place]) 10/08/2015    2.4cm spiculated LLL nodule suspicious for primary bronchogenic carcinoma by CT 09/2015 - adenocarcinoma by endobronchial biopsy    ALLERGIES:  is allergic to onion.  MEDICATIONS:  Current Outpatient Prescriptions  Medication Sig Dispense Refill  . aspirin 81 MG tablet Take 81 mg by mouth daily.      . budesonide-formoterol (SYMBICORT) 160-4.5 MCG/ACT inhaler Inhale 1 puff into the lungs 2 (two) times daily. 1 Inhaler 3  . cetirizine (ZYRTEC) 10 MG chewable tablet Chew 10 mg by mouth daily.    Marland Kitchen EPINEPHrine (EPIPEN) 0.3 mg/0.3 mL DEVI Inject 0.3 mLs (0.3 mg total) into the muscle once. 1 Device prn  . hyaluronate sodium (RADIAPLEXRX) GEL Apply 1 application topically 2 (two) times daily.    Marland Kitchen ibuprofen (ADVIL,MOTRIN) 200 MG tablet Take 600 mg by mouth every 6 (six) hours as needed (Pain). Reported on 11/16/2015    . losartan-hydrochlorothiazide (HYZAAR) 100-25 MG tablet Take 1 tablet by mouth daily. 90 tablet 3  . prochlorperazine (COMPAZINE) 10 MG tablet  Take 1 tablet (10 mg total) by mouth every 6 (six) hours as needed for nausea or vomiting. 30 tablet 0  . sildenafil (VIAGRA) 100 MG tablet Take 0.5-1 tablets (50-100 mg total) by mouth daily as needed for erectile dysfunction. 4 tablet 0   No current facility-administered medications for this visit.    SURGICAL HISTORY:  Past Surgical History  Procedure Laterality Date  . Hernia repair Right 2005    with mesh  . Insertion  prostate radiation seed  08/2011    prostate cancer, [place]  . Colonoscopy w/ biopsies and polypectomy  10/11/15    mult TAs, 3cm rectal polypoid lesion; rec further treatment, diverticulosis ([place])  . Endobronchial ultrasound Bilateral 10/18/2015    Procedure: ENDOBRONCHIAL ULTRASOUND;  Surgeon: Javier Glazier, MD;  Location: [facility];  Service: Cardiopulmonary;  Laterality: Bilateral;    REVIEW OF SYSTEMS:  Review of Systems  Constitutional: Negative.   HENT: Negative.   Eyes: Negative.   Respiratory: Positive for cough and sputum production. Negative for hemoptysis and shortness of breath.   Cardiovascular: Negative.   Gastrointestinal: Positive for constipation. Negative for heartburn, nausea, vomiting, abdominal pain, diarrhea and blood in stool.  Genitourinary: Negative.   Musculoskeletal: Negative.   Skin: Negative.   Neurological: Negative.   Endo/Heme/Allergies: Negative.   Psychiatric/Behavioral: Negative.      PHYSICAL EXAMINATION: Physical Exam  Constitutional: He is oriented to person, place, and time and well-developed, well-nourished, and in no distress.  HENT:  Head: Normocephalic and atraumatic.  Mouth/Throat: Oropharynx is clear and moist.  Eyes: Conjunctivae and EOM are normal. Pupils are equal, round, and reactive to light.  Neck: Normal range of motion. Neck supple.  Cardiovascular: Normal rate and regular rhythm.  Exam reveals no gallop and no friction rub.   No murmur heard. Pulmonary/Chest: No respiratory distress. He has wheezes. He has no rales. He exhibits no tenderness.  Abdominal: Soft. Bowel sounds are normal.  Musculoskeletal: Normal range of motion. He exhibits no edema.  Lymphadenopathy:    He has no cervical adenopathy.  Neurological: He is alert and oriented to person, place, and time. Gait normal.  Skin: Skin is warm and dry. No rash noted. No erythema.  Psychiatric: Mood, memory, affect and judgment normal.  Nursing note and  vitals reviewed.   ECOG PERFORMANCE STATUS: 1 - Symptomatic but completely ambulatory  Blood pressure 128/59, pulse 57, temperature 97.8 F (36.6 C), temperature source Oral, resp. rate 18, height '5\' 11"'$  (1.803 m), weight 224 lb 1.6 oz (101.651 kg), SpO2 100 %.  LABORATORY DATA: Lab Results  Component Value Date   WBC 4.1 11/22/2015   HGB 16.7 11/22/2015   HCT 49.8 11/22/2015   MCV 106.3* 11/22/2015   PLT 106* 11/22/2015      Chemistry      Component Value Date/Time   NA 141 11/22/2015 0910   NA 139 08/27/2015 0946   K 5.1 11/22/2015 0910   K 4.5 08/27/2015 0946   CL 99 08/27/2015 0946   CO2 30* 11/22/2015 0910   CO2 35* 08/27/2015 0946   BUN 16.1 11/22/2015 0910   BUN 14 10/25/2015 1036   CREATININE 0.8 11/22/2015 0910   CREATININE 0.85 10/25/2015 1036      Component Value Date/Time   CALCIUM 10.2 11/22/2015 0910   CALCIUM 10.0 08/27/2015 0946   ALKPHOS 57 11/22/2015 0910   ALKPHOS 62 08/27/2015 0946   AST 24 11/22/2015 0910   AST 21 08/27/2015 0946   ALT 29 11/22/2015 0910  ALT 20 08/27/2015 0946   BILITOT 0.85 11/22/2015 0910   BILITOT 1.2 08/27/2015 0946       RADIOGRAPHIC STUDIES:  Mr Jeri Cos QQ Contrast  10/29/2015  CLINICAL DATA:  Staging for metastatic lung cancer. No reported neurologic symptoms. EXAM: MRI HEAD WITHOUT AND WITH CONTRAST TECHNIQUE: Multiplanar, multiecho pulse sequences of the brain and surrounding structures were obtained without and with intravenous contrast. CONTRAST:  37m MULTIHANCE GADOBENATE DIMEGLUMINE 529 MG/ML IV SOLN COMPARISON:  PET scan 10/13/2015. FINDINGS: Widespread white matter signal abnormality, predominantly periventricular, both focal and confluent, largely sparing the brainstem and cerebellum. Mixed pattern on diffusion-weighted imaging, largely facilitated, corresponding to the areas of T2 and FLAIR hyperintensity, argues against acute pathology. Multiple black holes on T1 sagittal and axial imaging. Corpus callosum  atrophy, particularly in the proximal body. No abnormal postcontrast enhancement. Findings are consistent with chronic multiple sclerosis even though no specific history of MS is given. No acute stroke, acute hemorrhage, mass lesion, hydrocephalus, or extra-axial fluid. Premature for age atrophy. No chronic hemorrhage. Post infusion, no abnormal enhancement of the brain or meninges. No osseous findings. Visualized extracranial soft tissues unremarkable. IMPRESSION: No intracranial metastatic disease is evident. Widespread areas of white matter signal abnormality, favored to represent chronic multiple sclerosis. See discussion above. Generalized atrophy. No osseous lesions or extracranial soft tissue abnormalities. Electronically Signed   By: JStaci RighterM.D.   On: 10/29/2015 17:26   [place]  11/08/2015  CLINICAL DATA:  Left lower lobe lung carcinoma. PET-CT demonstrated hypermetabolic supraclavicular adenopathy, right. EXAM: ULTRASOUND GUIDED CORE BIOPSY OF RIGHT SUPRACLAVICULAR ADENOPATHY MEDICATIONS: Intravenous Fentanyl and Versed were administered as conscious sedation during continuous monitoring of the patient's level of consciousness and physiological / cardiorespiratory status by the radiology RN, with a total moderate sedation time of 20 minutes. PROCEDURE: The procedure, risks, benefits, and alternatives were explained to the patient. Questions regarding the procedure were encouraged and answered. The patient understands and consents to the procedure. Survey ultrasound of the right supraclavicular region performed and adenopathy corresponding to findings on recent PET-CT. An appropriate skin entry site was determined. The operative field was prepped with chlorhexidine in a sterile fashion, and a sterile drape was applied covering the operative field. A sterile gown and sterile gloves were used for the procedure. Local anesthesia was provided with 1% Lidocaine. Under ultrasound guidance, a 17 gauge  trocar needle was advanced to the margin of the lesion. Once needle tip position was confirmed, coaxial 18-gauge core biopsy samples were obtained, submitted in formalin to surgical pathology. The guide needle was removed. The patient tolerated the procedure well. COMPLICATIONS: None immediate FINDINGS: Ultrasound demonstrated hypoechoic enlarged right supraclavicular adenopathy, nodes measured up to 16 mm diameter. Ultrasound-guided core biopsy samples obtained. IMPRESSION: 1. Technically successful ultrasound-guided core biopsy, right supraclavicular adenopathy. Electronically Signed   By: DLucrezia EuropeM.D.   On: 11/08/2015 15:00     ASSESSMENT/PLAN:  No problem-specific assessment & plan notes found for this encounter. This is a pleasant 65year old white male recently diagnosed with non-small cell lung cancer. Currently receiving chemoradiation with carboplatin for an AUC of 2 and paclitaxel 45 mg/m. Status post one cycle of his chemotherapy.  The patient was seen and discussed with Dr. MJulien Nordmann Recent biopsy of the right supraclavicular lymph node was discussed. 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 This result is slightly different from his previous biopsy but still consistent with non-small cell lung cancer. Will send this for testing for  PDL 1. Recommend that he proceed with cycle 2 of his chemotherapy today as scheduled.  For his constipation he was advised to begin a stool softener twice a day and if not fully effective he can add MiraLAX 1 capful mixed in water or juice daily.  We discussed his cough and he prefers to use cough drops for now. Discussed that he can let us know if this is not working and we consider giving him something stronger.  We will go ahead and refer him to neurology given the results of his MRI which is favored to represent chronic multiple sclerosis. At this time, he is completely asymptomatic.  The  patient return next week for labs and chemotherapy and in 2 weeks for a visit with Dr. Julien Nordmann. He will continue radiation under the direction of Dr. Sondra Come.  All questions were answered. The patient knows to call the clinic with any problems, questions or concerns. We can certainly see the patient much sooner if necessary.   Mikey Bussing, DNP, AGPCNP-BC, AOCNP 11/22/2015  ADDENDUM: Hematology/Oncology Attending: I had a face to face encounter with the patient today. I recommended his care plan. This is a very pleasant 65 years old white male with a stage IIIB non-small cell lung cancer questionable for adenocarcinoma but the recent biopsy from the right supraclavicular lymph node was consistent with large cell neuroendocrine carcinoma. The patient is currently undergoing a course of concurrent chemoradiation with weekly carboplatin and paclitaxel is status post 1 cycle. He tolerated the first cycle of his treatment well with no significant adverse effects. I recommended for the patient to proceed with cycle #2 today as a scheduled. I will also request the pathology department to send his tissue block from the right supraclavicular lymph node to be tested for PDL 1 expression. I will see the patient back for follow-up visit in 2 weeks for evaluation and management of any adverse effect of his treatment. For the incidental finding of chronic multiple sclerosis on his brain MRI, I recommended for the patient to see neurology for evaluation of this condition. We will make the referral for him. He was advised to call immediately if he has any concerning symptoms in the interval.  Disclaimer: This note was dictated with voice recognition software. Similar sounding words can inadvertently be transcribed and may be missed upon review.  Eilleen Kempf., MD 11/22/2015

## 2015-11-22 NOTE — Patient Instructions (Signed)
[facility] Discharge Instructions for Patients Receiving Chemotherapy  Today you received the following chemotherapy agents:  Taxol and Carboplatin.  To help prevent nausea and vomiting after your treatment, we encourage you to take your nausea medication as prescribed.   If you develop nausea and vomiting that is not controlled by your nausea medication, call the clinic.   BELOW ARE SYMPTOMS THAT SHOULD BE REPORTED IMMEDIATELY:  *FEVER GREATER THAN 100.5 F  *CHILLS WITH OR WITHOUT FEVER  NAUSEA AND VOMITING THAT IS NOT CONTROLLED WITH YOUR NAUSEA MEDICATION  *UNUSUAL SHORTNESS OF BREATH  *UNUSUAL BRUISING OR BLEEDING  TENDERNESS IN MOUTH AND THROAT WITH OR WITHOUT PRESENCE OF ULCERS  *URINARY PROBLEMS  *BOWEL PROBLEMS  UNUSUAL RASH Items with * indicate a potential emergency and should be followed up as soon as possible.  Feel free to call the clinic you have any questions or concerns. The clinic phone number is [phone].  Please show the [place] at check-in to the Emergency Department and triage nurse.  Paclitaxel injection What is this medicine? PACLITAXEL (PAK li TAX el) is a chemotherapy drug. It targets fast dividing cells, like cancer cells, and causes these cells to die. This medicine is used to treat ovarian cancer, breast cancer, and other cancers. This medicine may be used for other purposes; ask your health care provider or pharmacist if you have questions. What should I tell my health care provider before I take this medicine? They need to know if you have any of these conditions: -blood disorders -irregular heartbeat -infection (especially a virus infection such as chickenpox, cold sores, or herpes) -liver disease -previous or ongoing radiation therapy -an unusual or allergic reaction to paclitaxel, alcohol, polyoxyethylated castor oil, other chemotherapy agents, other medicines, foods, dyes, or preservatives -pregnant or  trying to get pregnant -breast-feeding How should I use this medicine? This drug is given as an infusion into a vein. It is administered in a hospital or clinic by a specially trained health care professional. Talk to your pediatrician regarding the use of this medicine in children. Special care may be needed. Overdosage: If you think you have taken too much of this medicine contact a poison control center or emergency room at once. NOTE: This medicine is only for you. Do not share this medicine with others. What if I miss a dose? It is important not to miss your dose. Call your doctor or health care professional if you are unable to keep an appointment. What may interact with this medicine? Do not take this medicine with any of the following medications: -disulfiram -metronidazole This medicine may also interact with the following medications: -cyclosporine -diazepam -ketoconazole -medicines to increase blood counts like filgrastim, pegfilgrastim, sargramostim -other chemotherapy drugs like cisplatin, doxorubicin, epirubicin, etoposide, teniposide, vincristine -quinidine -testosterone -vaccines -verapamil Talk to your doctor or health care professional before taking any of these medicines: -acetaminophen -aspirin -ibuprofen -ketoprofen -naproxen This list may not describe all possible interactions. Give your health care provider a list of all the medicines, herbs, non-prescription drugs, or dietary supplements you use. Also tell them if you smoke, drink alcohol, or use illegal drugs. Some items may interact with your medicine. What should I watch for while using this medicine? Your condition will be monitored carefully while you are receiving this medicine. You will need important blood work done while you are taking this medicine. This drug may make you feel generally unwell. This is not uncommon, as chemotherapy can affect healthy cells as  well as cancer cells. Report any side  effects. Continue your course of treatment even though you feel ill unless your doctor tells you to stop. This medicine can cause serious allergic reactions. To reduce your risk you will need to take other medicine(s) before treatment with this medicine. In some cases, you may be given additional medicines to help with side effects. Follow all directions for their use. Call your doctor or health care professional for advice if you get a fever, chills or sore throat, or other symptoms of a cold or flu. Do not treat yourself. This drug decreases your body's ability to fight infections. Try to avoid being around people who are sick. This medicine may increase your risk to bruise or bleed. Call your doctor or health care professional if you notice any unusual bleeding. Be careful brushing and flossing your teeth or using a toothpick because you may get an infection or bleed more easily. If you have any dental work done, tell your dentist you are receiving this medicine. Avoid taking products that contain aspirin, acetaminophen, ibuprofen, naproxen, or ketoprofen unless instructed by your doctor. These medicines may hide a fever. Do not become pregnant while taking this medicine. Women should inform their doctor if they wish to become pregnant or think they might be pregnant. There is a potential for serious side effects to an unborn child. Talk to your health care professional or pharmacist for more information. Do not breast-feed an infant while taking this medicine. Men are advised not to father a child while receiving this medicine. This product may contain alcohol. Ask your pharmacist or healthcare provider if this medicine contains alcohol. Be sure to tell all healthcare providers you are taking this medicine. Certain medicines, like metronidazole and disulfiram, can cause an unpleasant reaction when taken with alcohol. The reaction includes flushing, headache, nausea, vomiting, sweating, and increased  thirst. The reaction can last from 30 minutes to several hours. What side effects may I notice from receiving this medicine? Side effects that you should report to your doctor or health care professional as soon as possible: -allergic reactions like skin rash, itching or hives, swelling of the face, lips, or tongue -low blood counts - This drug may decrease the number of white blood cells, red blood cells and platelets. You may be at increased risk for infections and bleeding. -signs of infection - fever or chills, cough, sore throat, pain or difficulty passing urine -signs of decreased platelets or bleeding - bruising, pinpoint red spots on the skin, black, tarry stools, nosebleeds -signs of decreased red blood cells - unusually weak or tired, fainting spells, lightheadedness -breathing problems -chest pain -high or low blood pressure -mouth sores -nausea and vomiting -pain, swelling, redness or irritation at the injection site -pain, tingling, numbness in the hands or feet -slow or irregular heartbeat -swelling of the ankle, feet, hands Side effects that usually do not require medical attention (report to your doctor or health care professional if they continue or are bothersome): -bone pain -complete hair loss including hair on your head, underarms, pubic hair, eyebrows, and eyelashes -changes in the color of fingernails -diarrhea -loosening of the fingernails -loss of appetite -muscle or joint pain -red flush to skin -sweating This list may not describe all possible side effects. Call your doctor for medical advice about side effects. You may report side effects to FDA at 1-800-FDA-1088. Where should I keep my medicine? This drug is given in a hospital or clinic and will not be stored  at home. NOTE: This sheet is a summary. It may not cover all possible information. If you have questions about this medicine, talk to your doctor, pharmacist, or health care provider.    2016,  Elsevier/Gold Standard. (2015-02-18 13:02:56)  Carboplatin injection What is this medicine? CARBOPLATIN (KAR boe pla tin) is a chemotherapy drug. It targets fast dividing cells, like cancer cells, and causes these cells to die. This medicine is used to treat ovarian cancer and many other cancers. This medicine may be used for other purposes; ask your health care provider or pharmacist if you have questions. What should I tell my health care provider before I take this medicine? They need to know if you have any of these conditions: -blood disorders -hearing problems -kidney disease -recent or ongoing radiation therapy -an unusual or allergic reaction to carboplatin, cisplatin, other chemotherapy, other medicines, foods, dyes, or preservatives -pregnant or trying to get pregnant -breast-feeding How should I use this medicine? This drug is usually given as an infusion into a vein. It is administered in a hospital or clinic by a specially trained health care professional. Talk to your pediatrician regarding the use of this medicine in children. Special care may be needed. Overdosage: If you think you have taken too much of this medicine contact a poison control center or emergency room at once. NOTE: This medicine is only for you. Do not share this medicine with others. What if I miss a dose? It is important not to miss a dose. Call your doctor or health care professional if you are unable to keep an appointment. What may interact with this medicine? -medicines for seizures -medicines to increase blood counts like filgrastim, pegfilgrastim, sargramostim -some antibiotics like amikacin, gentamicin, neomycin, streptomycin, tobramycin -vaccines Talk to your doctor or health care professional before taking any of these medicines: -acetaminophen -aspirin -ibuprofen -ketoprofen -naproxen This list may not describe all possible interactions. Give your health care provider a list of all the  medicines, herbs, non-prescription drugs, or dietary supplements you use. Also tell them if you smoke, drink alcohol, or use illegal drugs. Some items may interact with your medicine. What should I watch for while using this medicine? Your condition will be monitored carefully while you are receiving this medicine. You will need important blood work done while you are taking this medicine. This drug may make you feel generally unwell. This is not uncommon, as chemotherapy can affect healthy cells as well as cancer cells. Report any side effects. Continue your course of treatment even though you feel ill unless your doctor tells you to stop. In some cases, you may be given additional medicines to help with side effects. Follow all directions for their use. Call your doctor or health care professional for advice if you get a fever, chills or sore throat, or other symptoms of a cold or flu. Do not treat yourself. This drug decreases your body's ability to fight infections. Try to avoid being around people who are sick. This medicine may increase your risk to bruise or bleed. Call your doctor or health care professional if you notice any unusual bleeding. Be careful brushing and flossing your teeth or using a toothpick because you may get an infection or bleed more easily. If you have any dental work done, tell your dentist you are receiving this medicine. Avoid taking products that contain aspirin, acetaminophen, ibuprofen, naproxen, or ketoprofen unless instructed by your doctor. These medicines may hide a fever. Do not become pregnant while taking this  medicine. Women should inform their doctor if they wish to become pregnant or think they might be pregnant. There is a potential for serious side effects to an unborn child. Talk to your health care professional or pharmacist for more information. Do not breast-feed an infant while taking this medicine. What side effects may I notice from receiving this  medicine? Side effects that you should report to your doctor or health care professional as soon as possible: -allergic reactions like skin rash, itching or hives, swelling of the face, lips, or tongue -signs of infection - fever or chills, cough, sore throat, pain or difficulty passing urine -signs of decreased platelets or bleeding - bruising, pinpoint red spots on the skin, black, tarry stools, nosebleeds -signs of decreased red blood cells - unusually weak or tired, fainting spells, lightheadedness -breathing problems -changes in hearing -changes in vision -chest pain -high blood pressure -low blood counts - This drug may decrease the number of white blood cells, red blood cells and platelets. You may be at increased risk for infections and bleeding. -nausea and vomiting -pain, swelling, redness or irritation at the injection site -pain, tingling, numbness in the hands or feet -problems with balance, talking, walking -trouble passing urine or change in the amount of urine Side effects that usually do not require medical attention (report to your doctor or health care professional if they continue or are bothersome): -hair loss -loss of appetite -metallic taste in the mouth or changes in taste This list may not describe all possible side effects. Call your doctor for medical advice about side effects. You may report side effects to FDA at 1-800-FDA-1088. Where should I keep my medicine? This drug is given in a hospital or clinic and will not be stored at home. NOTE: This sheet is a summary. It may not cover all possible information. If you have questions about this medicine, talk to your doctor, pharmacist, or health care provider.    2016, Elsevier/Gold Standard. (2007-10-08 14:38:05)

## 2015-11-22 NOTE — Telephone Encounter (Signed)
perp of to sch pt appt-pt appt already made out-pt had copy

## 2015-11-23 ENCOUNTER — Ambulatory Visit: Payer: BLUE CROSS/BLUE SHIELD

## 2015-11-23 ENCOUNTER — Ambulatory Visit
Admission: RE | Admit: 2015-11-23 | Discharge: 2015-11-23 | Disposition: A | Discharge status: Home / Self Care | Payer: BLUE CROSS/BLUE SHIELD | Source: Ambulatory Visit | Attending: Radiation Oncology | Admitting: Radiation Oncology

## 2015-11-23 ENCOUNTER — Encounter: Payer: Self-pay | Admitting: Radiation Oncology

## 2015-11-23 VITALS — BP 140/79 | HR 60 | Temp 97.9°F | Ht 71.0 in | Wt 225.1 lb

## 2015-11-23 DIAGNOSIS — Z510 Encounter for antineoplastic radiation therapy: Secondary | ICD-10-CM | POA: Diagnosis not present

## 2015-11-23 DIAGNOSIS — C3432 Malignant neoplasm of lower lobe, left bronchus or lung: Secondary | ICD-10-CM | POA: Diagnosis not present

## 2015-11-23 NOTE — Progress Notes (Signed)
Weekly Management Note Current Dose: 20 Gy  Projected Dose: 60 Gy   Narrative:  The patient presents for routine under treatment assessment.  CBCT/MVCT images/Port film x-rays were reviewed.  The chart was checked. Brian White has completed 10 fractions to his chest. He denies having pain. He reports noticing a change in his voice and his chest is feeling tighter. He continues to have a cough with clear sputum. He thinks the cough is due to quitting smoking before treatment. He denies having shortness breath. He had chemotherapy yesterday. He reports his skin is intact and is using radiaplex. He is eating well and has no dysphagia.    Physical Findings:  Unchanged  Vitals:  Filed Vitals:   11/23/15 1140  BP: 140/79  Pulse: 60  Temp: 97.9 F (36.6 C)   Weight:  Wt Readings from Last 3 Encounters:  11/23/15 225 lb 1.6 oz (102.105 kg)  11/22/15 224 lb 1.6 oz (101.651 kg)  11/16/15 222 lb 12.8 oz (101.061 kg)   Lab Results  Component Value Date   WBC 4.1 11/22/2015   HGB 16.7 11/22/2015   HCT 49.8 11/22/2015   MCV 106.3* 11/22/2015   PLT 106* 11/22/2015   Lab Results  Component Value Date   CREATININE 0.8 11/22/2015   BUN 16.1 11/22/2015   NA 141 11/22/2015   K 5.1 11/22/2015   CL 99 08/27/2015   CO2 30* 11/22/2015     Impression:  The patient is tolerating radiation.  Plan:  Continue treatment as planned. Continues using Radiaplex.     ------------------------------------------------  Thea Silversmith, MD    This document serves as a record of services personally performed by Thea Silversmith, MD. It was created on her behalf by  Lendon Collar, a trained medical scribe. The creation of this record is based on the scribe's personal observations and the provider's statements to them. This document has been checked and approved by the attending provider.

## 2015-11-23 NOTE — Progress Notes (Addendum)
Brian White has completed 10 fractions to his chest.  He denies having pain.  He reports noticing a change in his voice and his chest is feeling tighter.  He continues to have a cough with clear sputum.  He thinks the cough is due to quitting smoking before treatment.  He denies having shortness breath.  He had chemotherapy yesterday.  He reports his skin is intact and is using radiaplex.  BP 140/79 mmHg  Pulse 60  Temp(Src) 97.9 F (36.6 C) (Oral)  Ht '5\' 11"'$  (1.803 m)  Wt 225 lb 1.6 oz (102.105 kg)  BMI 31.41 kg/m2  SpO2 99%   Wt Readings from Last 3 Encounters:  11/23/15 225 lb 1.6 oz (102.105 kg)  11/22/15 224 lb 1.6 oz (101.651 kg)  11/16/15 222 lb 12.8 oz (101.061 kg)

## 2015-11-24 ENCOUNTER — Ambulatory Visit
Admission: RE | Admit: 2015-11-24 | Discharge: 2015-11-24 | Disposition: A | Discharge status: Home / Self Care | Payer: BLUE CROSS/BLUE SHIELD | Source: Ambulatory Visit | Attending: Radiation Oncology | Admitting: Radiation Oncology

## 2015-11-24 ENCOUNTER — Ambulatory Visit: Payer: BLUE CROSS/BLUE SHIELD

## 2015-11-24 DIAGNOSIS — Z510 Encounter for antineoplastic radiation therapy: Secondary | ICD-10-CM | POA: Diagnosis not present

## 2015-11-25 ENCOUNTER — Ambulatory Visit: Payer: BLUE CROSS/BLUE SHIELD

## 2015-11-25 ENCOUNTER — Ambulatory Visit
Admission: RE | Admit: 2015-11-25 | Discharge: 2015-11-25 | Disposition: A | Discharge status: Home / Self Care | Payer: BLUE CROSS/BLUE SHIELD | Source: Ambulatory Visit | Attending: Radiation Oncology | Admitting: Radiation Oncology

## 2015-11-25 DIAGNOSIS — C3432 Malignant neoplasm of lower lobe, left bronchus or lung: Secondary | ICD-10-CM | POA: Diagnosis not present

## 2015-11-25 DIAGNOSIS — Z510 Encounter for antineoplastic radiation therapy: Secondary | ICD-10-CM | POA: Diagnosis not present

## 2015-11-26 ENCOUNTER — Ambulatory Visit
Admission: RE | Admit: 2015-11-26 | Discharge: 2015-11-26 | Disposition: A | Discharge status: Home / Self Care | Payer: BLUE CROSS/BLUE SHIELD | Source: Ambulatory Visit | Attending: Radiation Oncology | Admitting: Radiation Oncology

## 2015-11-26 ENCOUNTER — Ambulatory Visit: Payer: BLUE CROSS/BLUE SHIELD

## 2015-11-26 DIAGNOSIS — Z510 Encounter for antineoplastic radiation therapy: Secondary | ICD-10-CM | POA: Diagnosis not present

## 2015-11-29 ENCOUNTER — Other Ambulatory Visit (HOSPITAL_COMMUNITY)
Admission: RE | Admit: 2015-11-29 | Discharge: 2015-11-29 | Disposition: A | Discharge status: Home / Self Care | Payer: BLUE CROSS/BLUE SHIELD | Source: Ambulatory Visit | Attending: Internal Medicine | Admitting: Internal Medicine

## 2015-11-29 ENCOUNTER — Ambulatory Visit: Payer: BLUE CROSS/BLUE SHIELD

## 2015-11-29 ENCOUNTER — Ambulatory Visit
Admission: RE | Admit: 2015-11-29 | Discharge: 2015-11-29 | Disposition: A | Discharge status: Home / Self Care | Payer: BLUE CROSS/BLUE SHIELD | Source: Ambulatory Visit | Attending: Radiation Oncology | Admitting: Radiation Oncology

## 2015-11-29 ENCOUNTER — Ambulatory Visit (HOSPITAL_BASED_OUTPATIENT_CLINIC_OR_DEPARTMENT_OTHER): Payer: BLUE CROSS/BLUE SHIELD

## 2015-11-29 ENCOUNTER — Other Ambulatory Visit (HOSPITAL_BASED_OUTPATIENT_CLINIC_OR_DEPARTMENT_OTHER): Payer: BLUE CROSS/BLUE SHIELD

## 2015-11-29 VITALS — BP 133/73 | HR 64 | Temp 98.3°F | Resp 18

## 2015-11-29 DIAGNOSIS — Z5111 Encounter for antineoplastic chemotherapy: Secondary | ICD-10-CM

## 2015-11-29 DIAGNOSIS — C3432 Malignant neoplasm of lower lobe, left bronchus or lung: Secondary | ICD-10-CM | POA: Insufficient documentation

## 2015-11-29 DIAGNOSIS — Z510 Encounter for antineoplastic radiation therapy: Secondary | ICD-10-CM | POA: Diagnosis not present

## 2015-11-29 LAB — CBC WITH DIFFERENTIAL/PLATELET
BASO%: 0.7 % (ref 0.0–2.0)
Basophils Absolute: 0 10*3/uL (ref 0.0–0.1)
EOS%: 1.7 % (ref 0.0–7.0)
Eosinophils Absolute: 0.1 10*3/uL (ref 0.0–0.5)
HCT: 43.7 % (ref 38.4–49.9)
HGB: 15.3 g/dL (ref 13.0–17.1)
LYMPH%: 23 % (ref 14.0–49.0)
MCH: 36.3 pg — ABNORMAL HIGH (ref 27.2–33.4)
MCHC: 35 g/dL (ref 32.0–36.0)
MCV: 103.8 fL — ABNORMAL HIGH (ref 79.3–98.0)
MONO#: 0.3 10*3/uL (ref 0.1–0.9)
MONO%: 11.7 % (ref 0.0–14.0)
NEUT#: 1.8 10*3/uL (ref 1.5–6.5)
NEUT%: 62.9 % (ref 39.0–75.0)
Platelets: 92 10*3/uL — ABNORMAL LOW (ref 140–400)
RBC: 4.21 10*6/uL (ref 4.20–5.82)
RDW: 13.5 % (ref 11.0–14.6)
WBC: 2.9 10*3/uL — ABNORMAL LOW (ref 4.0–10.3)
lymph#: 0.7 10*3/uL — ABNORMAL LOW (ref 0.9–3.3)

## 2015-11-29 LAB — COMPREHENSIVE METABOLIC PANEL
ALT: 31 U/L (ref 0–55)
AST: 25 U/L (ref 5–34)
Albumin: 3.8 g/dL (ref 3.5–5.0)
Alkaline Phosphatase: 50 U/L (ref 40–150)
Anion Gap: 7 mEq/L (ref 3–11)
BUN: 17.2 mg/dL (ref 7.0–26.0)
CO2: 28 mEq/L (ref 22–29)
Calcium: 9.3 mg/dL (ref 8.4–10.4)
Chloride: 105 mEq/L (ref 98–109)
Creatinine: 0.8 mg/dL (ref 0.7–1.3)
EGFR: >90 mL/min/{1.73_m2} (ref >90)
Glucose: 80 mg/dl (ref 70–140)
Potassium: 4 mEq/L (ref 3.5–5.1)
Sodium: 140 mEq/L (ref 136–145)
Total Bilirubin: 0.72 mg/dL (ref 0.20–1.20)
Total Protein: 6.6 g/dL (ref 6.4–8.3)

## 2015-11-29 MED ORDER — PALONOSETRON HCL INJECTION 0.25 MG/5ML
INTRAVENOUS | Status: AC
  Filled 2015-11-29: qty 5

## 2015-11-29 MED ORDER — DIPHENHYDRAMINE HCL 50 MG/ML IJ SOLN
50.0000 mg | Freq: Once | INTRAMUSCULAR | Status: AC
  Administered 2015-11-29: 50 mg via INTRAVENOUS

## 2015-11-29 MED ORDER — SODIUM CHLORIDE 0.9 % IV SOLN
Freq: Once | INTRAVENOUS | Status: AC
  Administered 2015-11-29: 10:00:00 via INTRAVENOUS

## 2015-11-29 MED ORDER — FAMOTIDINE IN NACL 20-0.9 MG/50ML-% IV SOLN
20.0000 mg | Freq: Once | INTRAVENOUS | Status: AC
  Administered 2015-11-29: 20 mg via INTRAVENOUS

## 2015-11-29 MED ORDER — FAMOTIDINE IN NACL 20-0.9 MG/50ML-% IV SOLN
INTRAVENOUS | Status: AC
  Filled 2015-11-29: qty 50

## 2015-11-29 MED ORDER — SODIUM CHLORIDE 0.9 % IV SOLN
250.0000 mg | Freq: Once | INTRAVENOUS | Status: AC
  Administered 2015-11-29: 250 mg via INTRAVENOUS
  Filled 2015-11-29: qty 25

## 2015-11-29 MED ORDER — DIPHENHYDRAMINE HCL 50 MG/ML IJ SOLN
INTRAMUSCULAR | Status: AC
  Filled 2015-11-29: qty 1

## 2015-11-29 MED ORDER — SODIUM CHLORIDE 0.9 % IV SOLN
45.0000 mg/m2 | Freq: Once | INTRAVENOUS | Status: AC
  Administered 2015-11-29: 102 mg via INTRAVENOUS
  Filled 2015-11-29: qty 17

## 2015-11-29 MED ORDER — SODIUM CHLORIDE 0.9 % IV SOLN
20.0000 mg | Freq: Once | INTRAVENOUS | Status: AC
  Administered 2015-11-29: 20 mg via INTRAVENOUS
  Filled 2015-11-29: qty 2

## 2015-11-29 MED ORDER — PALONOSETRON HCL INJECTION 0.25 MG/5ML
0.2500 mg | Freq: Once | INTRAVENOUS | Status: AC
  Administered 2015-11-29: 0.25 mg via INTRAVENOUS

## 2015-11-29 NOTE — Patient Instructions (Signed)
[facility] Discharge Instructions for Patients Receiving Chemotherapy  Today you received the following chemotherapy agents Taxol and Carboplatin.  To help prevent nausea and vomiting after your treatment, we encourage you to take your nausea medication as prescribed.   If you develop nausea and vomiting that is not controlled by your nausea medication, call the clinic.   BELOW ARE SYMPTOMS THAT SHOULD BE REPORTED IMMEDIATELY:  *FEVER GREATER THAN 100.5 F  *CHILLS WITH OR WITHOUT FEVER  NAUSEA AND VOMITING THAT IS NOT CONTROLLED WITH YOUR NAUSEA MEDICATION  *UNUSUAL SHORTNESS OF BREATH  *UNUSUAL BRUISING OR BLEEDING  TENDERNESS IN MOUTH AND THROAT WITH OR WITHOUT PRESENCE OF ULCERS  *URINARY PROBLEMS  *BOWEL PROBLEMS  UNUSUAL RASH Items with * indicate a potential emergency and should be followed up as soon as possible.  Feel free to call the clinic you have any questions or concerns. The clinic phone number is [phone].  Please show the CHEMO ALERT CARD at check-in to the Emergency Department and triage nurse.   

## 2015-11-29 NOTE — Progress Notes (Signed)
Okay to treat with platelet count results today, per Dr. Julien Nordmann.

## 2015-11-30 ENCOUNTER — Ambulatory Visit: Payer: BLUE CROSS/BLUE SHIELD

## 2015-11-30 ENCOUNTER — Ambulatory Visit: Payer: BLUE CROSS/BLUE SHIELD | Admitting: Radiation Oncology

## 2015-11-30 ENCOUNTER — Ambulatory Visit
Admission: RE | Admit: 2015-11-30 | Discharge: 2015-11-30 | Disposition: A | Discharge status: Home / Self Care | Payer: BLUE CROSS/BLUE SHIELD | Source: Ambulatory Visit | Attending: Radiation Oncology | Admitting: Radiation Oncology

## 2015-11-30 DIAGNOSIS — C3432 Malignant neoplasm of lower lobe, left bronchus or lung: Secondary | ICD-10-CM | POA: Diagnosis not present

## 2015-11-30 DIAGNOSIS — Z510 Encounter for antineoplastic radiation therapy: Secondary | ICD-10-CM | POA: Diagnosis not present

## 2015-12-01 ENCOUNTER — Ambulatory Visit
Admission: RE | Admit: 2015-12-01 | Discharge: 2015-12-01 | Disposition: A | Discharge status: Home / Self Care | Payer: BLUE CROSS/BLUE SHIELD | Source: Ambulatory Visit | Attending: Radiation Oncology | Admitting: Radiation Oncology

## 2015-12-01 ENCOUNTER — Encounter: Payer: Self-pay | Admitting: Radiation Oncology

## 2015-12-01 ENCOUNTER — Ambulatory Visit: Payer: BLUE CROSS/BLUE SHIELD

## 2015-12-01 VITALS — BP 141/75 | HR 56 | Temp 97.9°F | Ht 71.0 in | Wt 220.9 lb

## 2015-12-01 DIAGNOSIS — Z510 Encounter for antineoplastic radiation therapy: Secondary | ICD-10-CM | POA: Diagnosis not present

## 2015-12-01 DIAGNOSIS — C3432 Malignant neoplasm of lower lobe, left bronchus or lung: Secondary | ICD-10-CM

## 2015-12-01 NOTE — Progress Notes (Signed)
Brian White has completed 16 fractions to his chest.  He denies having pain and shortness of breath..  He reports having a cough with clear sputum.  He reports feeling like his throat is tight and has been taking smaller bites of food.  His weight is down 5 lbs from last week.  He reports having a good appetite and is eating the same amount.  He denies having fatigue.  He had chemotherapy Monday.  The skin on his central chest is red with dermatitis.  He is using radiapelx.  He also reports having itching and has used hydrocortisone cream with aloe.  BP 141/75 mmHg  Pulse 56  Temp(Src) 97.9 F (36.6 C) (Oral)  Ht '5\' 11"'$  (1.803 m)  Wt 220 lb 14.4 oz (100.2 kg)  BMI 30.82 kg/m2  SpO2 100%   Wt Readings from Last 3 Encounters:  12/01/15 220 lb 14.4 oz (100.2 kg)  11/23/15 225 lb 1.6 oz (102.105 kg)  11/22/15 224 lb 1.6 oz (101.651 kg)

## 2015-12-01 NOTE — Progress Notes (Signed)
  Radiation Oncology         [phone]) [phone] ________________________________  Name: Brian White MRN: [id number]  Date: 12/01/2015  DOB: [birth year]  Weekly Radiation Therapy Management    ICD-9-CM ICD-10-CM   1. Cancer of lower lobe of left lung (HCC) 162.5 C34.32     Current Dose: 32 Gy     Planned Dose:  60 Gy  Narrative . . . . . . . . The patient presents for routine under treatment assessment.                     Brian White has completed 16 fractions to his chest. He denies having pain and shortness of breath.. He reports having a cough with clear sputum. He reports feeling like his throat is tight and has been taking smaller bites of food. Describes his throat as not sore or painful. His weight is down 5 lbs from last week. He reports having a good appetite and is eating the same amount. He is not taking carafate. He denies having fatigue. He had chemotherapy Monday. The skin on his central chest is red with dermatitis. He is using radiapelx. He also reports having itching and has used hydrocortisone cream with aloe. Describes this skin as not feeling painful or sore, just itchy.                                  Set-up films were reviewed.                                 The chart was checked. Physical Findings. . .  height is '5\' 11"'$  (1.803 m) and weight is 220 lb 14.4 oz (100.2 kg). His oral temperature is 97.9 F (36.6 C). His blood pressure is 141/75 and his pulse is 56. His oxygen saturation is 100%. . Weight essentially stable.  No significant changes.  The lungs are clear. The heart has a regular rhythm and rate. Radiation changes on the anterior chest.  Impression . . . . . . . The patient is tolerating radiation. Plan . . . . . . . . . . . . Continue treatment as planned. Prescription written for carafate. ________________________________   Blair Promise, PhD, MD  This document serves as a record of services personally performed by Gery Pray, MD. It was created on his  behalf by Arlyce Harman, a trained medical scribe. The creation of this record is based on the scribe's personal observations and the provider's statements to them. This document has been checked and approved by the attending provider.

## 2015-12-02 ENCOUNTER — Ambulatory Visit
Admission: RE | Admit: 2015-12-02 | Discharge: 2015-12-02 | Disposition: A | Discharge status: Home / Self Care | Payer: BLUE CROSS/BLUE SHIELD | Source: Ambulatory Visit | Attending: Radiation Oncology | Admitting: Radiation Oncology

## 2015-12-02 ENCOUNTER — Ambulatory Visit: Payer: BLUE CROSS/BLUE SHIELD

## 2015-12-02 DIAGNOSIS — Z510 Encounter for antineoplastic radiation therapy: Secondary | ICD-10-CM | POA: Diagnosis not present

## 2015-12-03 ENCOUNTER — Other Ambulatory Visit: Payer: Self-pay | Admitting: Medical Oncology

## 2015-12-03 ENCOUNTER — Ambulatory Visit
Admission: RE | Admit: 2015-12-03 | Discharge: 2015-12-03 | Disposition: A | Discharge status: Home / Self Care | Payer: BLUE CROSS/BLUE SHIELD | Source: Ambulatory Visit | Attending: Radiation Oncology | Admitting: Radiation Oncology

## 2015-12-03 ENCOUNTER — Ambulatory Visit: Payer: BLUE CROSS/BLUE SHIELD

## 2015-12-03 DIAGNOSIS — Z510 Encounter for antineoplastic radiation therapy: Secondary | ICD-10-CM | POA: Diagnosis not present

## 2015-12-06 ENCOUNTER — Other Ambulatory Visit (HOSPITAL_BASED_OUTPATIENT_CLINIC_OR_DEPARTMENT_OTHER): Payer: BLUE CROSS/BLUE SHIELD

## 2015-12-06 ENCOUNTER — Ambulatory Visit (HOSPITAL_BASED_OUTPATIENT_CLINIC_OR_DEPARTMENT_OTHER): Payer: BLUE CROSS/BLUE SHIELD

## 2015-12-06 ENCOUNTER — Ambulatory Visit: Payer: BLUE CROSS/BLUE SHIELD

## 2015-12-06 ENCOUNTER — Ambulatory Visit
Admission: RE | Admit: 2015-12-06 | Discharge: 2015-12-06 | Disposition: A | Discharge status: Home / Self Care | Payer: BLUE CROSS/BLUE SHIELD | Source: Ambulatory Visit | Attending: Radiation Oncology | Admitting: Radiation Oncology

## 2015-12-06 ENCOUNTER — Encounter (HOSPITAL_COMMUNITY): Payer: Self-pay

## 2015-12-06 ENCOUNTER — Ambulatory Visit (HOSPITAL_BASED_OUTPATIENT_CLINIC_OR_DEPARTMENT_OTHER): Payer: BLUE CROSS/BLUE SHIELD | Admitting: Internal Medicine

## 2015-12-06 ENCOUNTER — Encounter: Payer: Self-pay | Admitting: Internal Medicine

## 2015-12-06 VITALS — BP 140/85 | HR 70 | Temp 98.2°F | Resp 18 | Ht 71.0 in | Wt 224.3 lb

## 2015-12-06 DIAGNOSIS — R21 Rash and other nonspecific skin eruption: Secondary | ICD-10-CM | POA: Diagnosis not present

## 2015-12-06 DIAGNOSIS — Z5111 Encounter for antineoplastic chemotherapy: Secondary | ICD-10-CM | POA: Diagnosis not present

## 2015-12-06 DIAGNOSIS — C3432 Malignant neoplasm of lower lobe, left bronchus or lung: Secondary | ICD-10-CM

## 2015-12-06 DIAGNOSIS — B029 Zoster without complications: Secondary | ICD-10-CM

## 2015-12-06 DIAGNOSIS — Z510 Encounter for antineoplastic radiation therapy: Secondary | ICD-10-CM | POA: Diagnosis not present

## 2015-12-06 HISTORY — DX: Zoster without complications: B02.9

## 2015-12-06 LAB — CBC WITH DIFFERENTIAL/PLATELET
BASO%: 1 % (ref 0.0–2.0)
Basophils Absolute: 0 10*3/uL (ref 0.0–0.1)
EOS%: 1.3 % (ref 0.0–7.0)
Eosinophils Absolute: 0 10*3/uL (ref 0.0–0.5)
HCT: 42.5 % (ref 38.4–49.9)
HGB: 14.8 g/dL (ref 13.0–17.1)
LYMPH%: 20.9 % (ref 14.0–49.0)
MCH: 35.7 pg — ABNORMAL HIGH (ref 27.2–33.4)
MCHC: 34.8 g/dL (ref 32.0–36.0)
MCV: 102.7 fL — ABNORMAL HIGH (ref 79.3–98.0)
MONO#: 0.4 10*3/uL (ref 0.1–0.9)
MONO%: 13.2 % (ref 0.0–14.0)
NEUT#: 2 10*3/uL (ref 1.5–6.5)
NEUT%: 63.6 % (ref 39.0–75.0)
Platelets: 81 10*3/uL — ABNORMAL LOW (ref 140–400)
RBC: 4.14 10*6/uL — ABNORMAL LOW (ref 4.20–5.82)
RDW: 13.3 % (ref 11.0–14.6)
WBC: 3.1 10*3/uL — ABNORMAL LOW (ref 4.0–10.3)
lymph#: 0.7 10*3/uL — ABNORMAL LOW (ref 0.9–3.3)

## 2015-12-06 LAB — COMPREHENSIVE METABOLIC PANEL
ALT: 35 U/L (ref 0–55)
AST: 29 U/L (ref 5–34)
Albumin: 3.8 g/dL (ref 3.5–5.0)
Alkaline Phosphatase: 52 U/L (ref 40–150)
Anion Gap: 7 mEq/L (ref 3–11)
BUN: 13.3 mg/dL (ref 7.0–26.0)
CO2: 29 mEq/L (ref 22–29)
Calcium: 9.6 mg/dL (ref 8.4–10.4)
Chloride: 105 mEq/L (ref 98–109)
Creatinine: 0.8 mg/dL (ref 0.7–1.3)
EGFR: >90 mL/min/{1.73_m2} (ref >90)
Glucose: 98 mg/dl (ref 70–140)
Potassium: 5.1 mEq/L (ref 3.5–5.1)
Sodium: 141 mEq/L (ref 136–145)
Total Bilirubin: 0.73 mg/dL (ref 0.20–1.20)
Total Protein: 6.6 g/dL (ref 6.4–8.3)

## 2015-12-06 MED ORDER — PALONOSETRON HCL INJECTION 0.25 MG/5ML
0.2500 mg | Freq: Once | INTRAVENOUS | Status: AC
  Administered 2015-12-06: 0.25 mg via INTRAVENOUS

## 2015-12-06 MED ORDER — FAMOTIDINE IN NACL 20-0.9 MG/50ML-% IV SOLN
20.0000 mg | Freq: Once | INTRAVENOUS | Status: AC
  Administered 2015-12-06: 20 mg via INTRAVENOUS

## 2015-12-06 MED ORDER — DIPHENHYDRAMINE HCL 50 MG/ML IJ SOLN
INTRAMUSCULAR | Status: AC
  Filled 2015-12-06: qty 1

## 2015-12-06 MED ORDER — DEXAMETHASONE SODIUM PHOSPHATE 100 MG/10ML IJ SOLN
20.0000 mg | Freq: Once | INTRAMUSCULAR | Status: AC
  Administered 2015-12-06: 20 mg via INTRAVENOUS
  Filled 2015-12-06: qty 2

## 2015-12-06 MED ORDER — FAMOTIDINE IN NACL 20-0.9 MG/50ML-% IV SOLN
INTRAVENOUS | Status: AC
  Filled 2015-12-06: qty 50

## 2015-12-06 MED ORDER — CARBOPLATIN CHEMO INJECTION 450 MG/45ML
250.0000 mg | Freq: Once | INTRAVENOUS | Status: AC
  Administered 2015-12-06: 250 mg via INTRAVENOUS
  Filled 2015-12-06: qty 25

## 2015-12-06 MED ORDER — PALONOSETRON HCL INJECTION 0.25 MG/5ML
INTRAVENOUS | Status: AC
  Filled 2015-12-06: qty 5

## 2015-12-06 MED ORDER — DIPHENHYDRAMINE HCL 50 MG/ML IJ SOLN
50.0000 mg | Freq: Once | INTRAMUSCULAR | Status: AC
  Administered 2015-12-06: 50 mg via INTRAVENOUS

## 2015-12-06 MED ORDER — SODIUM CHLORIDE 0.9 % IV SOLN
45.0000 mg/m2 | Freq: Once | INTRAVENOUS | Status: AC
  Administered 2015-12-06: 102 mg via INTRAVENOUS
  Filled 2015-12-06: qty 17

## 2015-12-06 MED ORDER — VALACYCLOVIR HCL 500 MG PO TABS: 500.0000 mg | ORAL_TABLET | Freq: Two times a day (BID) | ORAL | Status: DC

## 2015-12-06 MED ORDER — SODIUM CHLORIDE 0.9 % IV SOLN
Freq: Once | INTRAVENOUS | Status: AC
  Administered 2015-12-06: 10:00:00 via INTRAVENOUS

## 2015-12-06 NOTE — Patient Instructions (Signed)
[facility] Discharge Instructions for Patients Receiving Chemotherapy  Today you received the following chemotherapy agents:  Carboplatin, Taxol  To help prevent nausea and vomiting after your treatment, we encourage you to take your nausea medication as prescribed.   If you develop nausea and vomiting that is not controlled by your nausea medication, call the clinic.   BELOW ARE SYMPTOMS THAT SHOULD BE REPORTED IMMEDIATELY:  *FEVER GREATER THAN 100.5 F  *CHILLS WITH OR WITHOUT FEVER  NAUSEA AND VOMITING THAT IS NOT CONTROLLED WITH YOUR NAUSEA MEDICATION  *UNUSUAL SHORTNESS OF BREATH  *UNUSUAL BRUISING OR BLEEDING  TENDERNESS IN MOUTH AND THROAT WITH OR WITHOUT PRESENCE OF ULCERS  *URINARY PROBLEMS  *BOWEL PROBLEMS  UNUSUAL RASH Items with * indicate a potential emergency and should be followed up as soon as possible.  Feel free to call the clinic you have any questions or concerns. The clinic phone number is [phone].  Please show the CHEMO ALERT CARD at check-in to the Emergency Department and triage nurse.   

## 2015-12-06 NOTE — Progress Notes (Signed)
[place] [phone]   [phone]  OFFICE PROGRESS NOTE  [name], MD [place] [employer] [place] [address]  DIAGNOSIS: Stage IIIB (T1b, N3, M0) non-small cell lung cancer favoring adenocarcinoma presented with left lower lobe pulmonary nodule in addition to left hilar and bilateral mediastinal lymphadenopathy in addition to right supraclavicular lymph nodes diagnosed in April 2017.  PRIOR THERAPY: None  CURRENT THERAPY: Concurrent chemoradiation with weekly carboplatin for AUC of 2 and paclitaxel 45 mg/M2 started on 11/15/2015. He is status post 3 cycles.  INTERVAL HISTORY: Brian White 65 y.o. male returns to the clinic today for follow-up visit accompanied by his wife. The patient is feeling fine today was no specific complaints except for rash on the anterior part of the chest. It is itching and painful at times. He applied hydrocortisone cream with no improvement. The patient denied having any significant chest pain, shortness breath, cough or hemoptysis. He has no nausea or vomiting, no fever or chills. He is tolerating his treatment with concurrent chemoradiation fairly well. He is here today to start cycle #4 of his chemotherapy.  MEDICAL HISTORY: Past Medical History  Diagnosis Date  . Hypertension   . Arthritis   . CAD (coronary artery disease)     mild MI; pt unaware - seen on stress test  . H/O seasonal allergies   . H/O anaphylactic shock 2013    hives,hypotension,syncope  . Smoker   . ED (erectile dysfunction)   . Polycythemia 01/25/2014  . Allergy   . Thoracic aorta atherosclerosis ([place] 10/08/2015    Mild by CT 09/2015   . COPD (chronic obstructive pulmonary disease) ([place]) 06/26/2011    Mild centrilobular and paraseptal emphysema by CT 09/2015   . Radiation 09/18/2011 through 11/10/2011    Prostate 7600 cGy 40 sessions, seminal vesicles 5600 cGy 40 sessions    . Shortness of breath dyspnea    . Prostate cancer [facility]) 2013    s/p radiation therapy March 2013, planned f/u with uro after XRT  . Adenocarcinoma, lung ([place]) 10/08/2015    2.4cm spiculated LLL nodule suspicious for primary bronchogenic carcinoma by CT 09/2015 - adenocarcinoma by endobronchial biopsy    ALLERGIES:  is allergic to onion.  MEDICATIONS:  Current Outpatient Prescriptions  Medication Sig Dispense Refill  . aspirin 81 MG tablet Take 81 mg by mouth daily. Reported on 11/23/2015    . budesonide-formoterol (SYMBICORT) 160-4.5 MCG/ACT inhaler Inhale 1 puff into the lungs 2 (two) times daily. 1 Inhaler 3  . cetirizine (ZYRTEC) 10 MG chewable tablet Chew 10 mg by mouth daily.    Marland Kitchen EPINEPHrine (EPIPEN) 0.3 mg/0.3 mL DEVI Inject 0.3 mLs (0.3 mg total) into the muscle once. 1 Device prn  . hyaluronate sodium (RADIAPLEXRX) GEL Apply 1 application topically 2 (two) times daily.    Marland Kitchen ibuprofen (ADVIL,MOTRIN) 200 MG tablet Take 600 mg by mouth every 6 (six) hours as needed (Pain). Reported on 11/16/2015    . losartan-hydrochlorothiazide (HYZAAR) 100-25 MG tablet Take 1 tablet by mouth daily. 90 tablet 3  . prochlorperazine (COMPAZINE) 10 MG tablet Take 1 tablet (10 mg total) by mouth every 6 (six) hours as needed for nausea or vomiting. 30 tablet 0  . sildenafil (VIAGRA) 100 MG tablet Take 0.5-1 tablets (50-100 mg total) by mouth daily as needed for erectile dysfunction. 4 tablet 0   No current facility-administered medications for this visit.    SURGICAL HISTORY:  Past Surgical History  Procedure Laterality Date  . Hernia repair Right 2005    with mesh  . Insertion prostate radiation seed  08/2011    prostate cancer, [place]  . Colonoscopy w/ biopsies and polypectomy  10/11/15    mult TAs, 3cm rectal polypoid lesion; rec further treatment, diverticulosis ([place])  . Endobronchial ultrasound Bilateral 10/18/2015    Procedure: ENDOBRONCHIAL ULTRASOUND;  Surgeon: Javier Glazier, MD;  Location: [facility];   Service: Cardiopulmonary;  Laterality: Bilateral;    REVIEW OF SYSTEMS:  Constitutional: negative Eyes: negative Ears, nose, mouth, throat, and face: negative Respiratory: negative Cardiovascular: negative Gastrointestinal: negative Genitourinary:negative Integument/breast: positive for rash Hematologic/lymphatic: negative Musculoskeletal:negative Neurological: negative Behavioral/Psych: negative Endocrine: negative Allergic/Immunologic: negative   PHYSICAL EXAMINATION: General appearance: alert, cooperative and no distress Head: Normocephalic, without obvious abnormality, atraumatic Neck: no adenopathy, no JVD, supple, symmetrical, trachea midline and thyroid not enlarged, symmetric, no tenderness/mass/nodules Lymph nodes: Cervical, supraclavicular, and axillary nodes normal. Resp: clear to auscultation bilaterally Back: symmetric, no curvature. ROM normal. No CVA tenderness. Cardio: regular rate and rhythm, S1, S2 normal, no murmur, click, rub or gallop GI: soft, non-tender; bowel sounds normal; no masses,  no organomegaly Extremities: extremities normal, atraumatic, no cyanosis or edema Neurologic: Alert and oriented X 3, normal strength and tone. Normal symmetric reflexes. Normal coordination and gait  Skin exam: Follicular rash on the anterior part of the chest as seen below.     ECOG PERFORMANCE STATUS: 1 - Symptomatic but completely ambulatory  Blood pressure 140/85, pulse 70, temperature 98.2 F (36.8 C), temperature source Oral, resp. rate 18, height '5\' 11"'$  (1.803 m), weight 224 lb 4.8 oz (101.742 kg), SpO2 100 %.  LABORATORY DATA: Lab Results  Component Value Date   WBC 3.1* 12/06/2015   HGB 14.8 12/06/2015   HCT 42.5 12/06/2015   MCV 102.7* 12/06/2015   PLT 81* 12/06/2015      Chemistry      Component Value Date/Time   NA 140 11/29/2015 0927   NA 139 08/27/2015 0946   K 4.0 11/29/2015 0927   K 4.5 08/27/2015 0946   CL 99 08/27/2015 0946   CO2 28  11/29/2015 0927   CO2 35* 08/27/2015 0946   BUN 17.2 11/29/2015 0927   BUN 14 10/25/2015 1036   CREATININE 0.8 11/29/2015 0927   CREATININE 0.85 10/25/2015 1036      Component Value Date/Time   CALCIUM 9.3 11/29/2015 0927   CALCIUM 10.0 08/27/2015 0946   ALKPHOS 50 11/29/2015 0927   ALKPHOS 62 08/27/2015 0946   AST 25 11/29/2015 0927   AST 21 08/27/2015 0946   ALT 31 11/29/2015 0927   ALT 20 08/27/2015 0946   BILITOT 0.72 11/29/2015 0927   BILITOT 1.2 08/27/2015 0946       RADIOGRAPHIC STUDIES: US Biopsy  11/08/2015  CLINICAL DATA:  Left lower lobe lung carcinoma. PET-CT demonstrated hypermetabolic supraclavicular adenopathy, right. EXAM: ULTRASOUND GUIDED CORE BIOPSY OF RIGHT SUPRACLAVICULAR ADENOPATHY MEDICATIONS: Intravenous Fentanyl and Versed were administered as conscious sedation during continuous monitoring of the patient's level of consciousness and physiological / cardiorespiratory status by the radiology RN, with a total moderate sedation time of 20 minutes. PROCEDURE: The procedure, risks, benefits, and alternatives were explained to the patient. Questions regarding the procedure were encouraged and answered. The patient understands and consents to the procedure. Survey ultrasound of the right supraclavicular region performed and adenopathy corresponding to findings on recent PET-CT. An appropriate skin entry site was determined. The operative field was prepped with chlorhexidine in  a sterile fashion, and a sterile drape was applied covering the operative field. A sterile gown and sterile gloves were used for the procedure. Local anesthesia was provided with 1% Lidocaine. Under ultrasound guidance, a 17 gauge trocar needle was advanced to the margin of the lesion. Once needle tip position was confirmed, coaxial 18-gauge core biopsy samples were obtained, submitted in formalin to surgical pathology. The guide needle was removed. The patient tolerated the procedure well.  COMPLICATIONS: None immediate FINDINGS: Ultrasound demonstrated hypoechoic enlarged right supraclavicular adenopathy, nodes measured up to 16 mm diameter. Ultrasound-guided core biopsy samples obtained. IMPRESSION: 1. Technically successful ultrasound-guided core biopsy, right supraclavicular adenopathy. Electronically Signed   By: Lucrezia Europe M.D.   On: 11/08/2015 15:00    ASSESSMENT AND PLAN: This is a very pleasant 65 years old white male with stage IIIB non-small cell lung cancer currently undergoing a course of concurrent chemoradiation with weekly carboplatin and paclitaxel is status post 3 cycles. He is tolerating his treatment well with no significant adverse effects. We will proceed with cycle #4 today as a scheduled despite blood count. For the anterior chest skin rash, this is suspicious for herpes zoster, I will start the patient on Valtrex 500 mg by mouth twice a day for 10 days. The patient would come back for follow-up visit in 2 weeks for evaluation before starting cycle #6 of his concurrent chemoradiation. He was advised to call immediately if he has any concerning symptoms in the interval. The patient voices understanding of current disease status and treatment options and is in agreement with the current care plan.  All questions were answered. The patient knows to call the clinic with any problems, questions or concerns. We can certainly see the patient much sooner if necessary.  I spent 15 minutes counseling the patient face to face. The total time spent in the appointment was 25 minutes.  Disclaimer: This note was dictated with voice recognition software. Similar sounding words can inadvertently be transcribed and may not be corrected upon review.

## 2015-12-06 NOTE — Progress Notes (Signed)
PER MD ok to treat with low counts PLT 81

## 2015-12-07 ENCOUNTER — Ambulatory Visit
Admission: RE | Admit: 2015-12-07 | Discharge: 2015-12-07 | Disposition: A | Discharge status: Home / Self Care | Payer: BLUE CROSS/BLUE SHIELD | Source: Ambulatory Visit | Attending: Radiation Oncology | Admitting: Radiation Oncology

## 2015-12-07 ENCOUNTER — Ambulatory Visit: Payer: BLUE CROSS/BLUE SHIELD

## 2015-12-07 ENCOUNTER — Encounter: Payer: Self-pay | Admitting: Radiation Oncology

## 2015-12-07 VITALS — BP 118/56 | HR 60 | Temp 97.8°F | Resp 16 | Ht 71.0 in | Wt 224.3 lb

## 2015-12-07 DIAGNOSIS — C3432 Malignant neoplasm of lower lobe, left bronchus or lung: Secondary | ICD-10-CM | POA: Insufficient documentation

## 2015-12-07 DIAGNOSIS — Z510 Encounter for antineoplastic radiation therapy: Secondary | ICD-10-CM | POA: Diagnosis not present

## 2015-12-07 MED ORDER — SONAFINE EX EMUL
1.0000 "application " | Freq: Once | CUTANEOUS | Status: AC
  Administered 2015-12-07: 1 via TOPICAL
  Filled 2015-12-07: qty 45

## 2015-12-07 MED ORDER — SUCRALFATE 1 GM/10ML PO SUSP: 1.0000 g | Freq: Three times a day (TID) | ORAL | Status: DC

## 2015-12-07 NOTE — Progress Notes (Signed)
  Radiation Oncology         [phone]) [phone] ________________________________  Name: Brian White MRN: [id number]  Date: 12/07/2015  DOB: 1951/03/09  Weekly Radiation Therapy Management    ICD-9-CM ICD-10-CM   1. Cancer of lower lobe of left lung (HCC) 162.5 C34.32 SONAFINE emulsion 1 application     Current Dose: 40 Gy     Planned Dose:  60 Gy  Narrative . . . . . . . . The patient presents for routine under treatment assessment.                                   Brian White has completed 20 fractions. He reports feeling like his throat is tight when food is going down. He is wondering if he can get a prescription for carafate. She denies having shortness of breath. He reports an occasional cough with clear sputum. He had chemotherapy yesterday. He reports having slight fatigue. He also reports he looses his voice when talking a lot. He was given valtrex yesterday by Dr. Julien Nordmann for the rash on his chest. The skin on his mid chest is red with scabbed blisters.                                 Set-up films were reviewed.                                 The chart was checked. Physical Findings. . .  height is '5\' 11"'$  (1.803 m) and weight is 224 lb 4.8 oz (101.742 kg). His oral temperature is 97.8 F (36.6 C). His blood pressure is 118/56 and his pulse is 60. His respiration is 16 and oxygen saturation is 100%. . The lungs are clear. The heart has a regular rhythm and rate. Patient has a significant rash along his anterior chest which is in and outside of his radiation field. This will be consistent with shingles and Valtrex started as above. Impression . . . . . . . The patient is tolerating radiation. Plan . . . . . . . . . . . . Continue treatment as planned.  ________________________________   Blair Promise, PhD, MD

## 2015-12-07 NOTE — Progress Notes (Signed)
Brian White has completed 20 fractions.  He reports feeling like his throat is tight when food is going down.  He is wondering if he can get a prescription for carafate.  She denies having shortness of breath.  He reports an occasional cough with clear sputum.  He had chemotherapy yesterday.  He reports having slight fatigue.  He also reports he looses his voice when talking a lot.  He was given valtrex yesterday by Dr. Julien Nordmann for the rash on his chest.  The skin on his mid chest is red with scabbed blisters.  His upper back is also red.  He reports that he has been using radiaplex on and off.  He said it makes the area itch more and he has to wash it off.  He has been given sonafine to try.  BP 118/56 mmHg  Pulse 60  Temp(Src) 97.8 F (36.6 C) (Oral)  Resp 16  Ht '5\' 11"'$  (1.803 m)  Wt 224 lb 4.8 oz (101.742 kg)  BMI 31.30 kg/m2  SpO2 100%   Wt Readings from Last 3 Encounters:  12/07/15 224 lb 4.8 oz (101.742 kg)  12/06/15 224 lb 4.8 oz (101.742 kg)  12/01/15 220 lb 14.4 oz (100.2 kg)

## 2015-12-08 ENCOUNTER — Ambulatory Visit
Admission: RE | Admit: 2015-12-08 | Discharge: 2015-12-08 | Disposition: A | Discharge status: Home / Self Care | Payer: BLUE CROSS/BLUE SHIELD | Source: Ambulatory Visit | Attending: Radiation Oncology | Admitting: Radiation Oncology

## 2015-12-08 ENCOUNTER — Ambulatory Visit: Payer: BLUE CROSS/BLUE SHIELD

## 2015-12-08 DIAGNOSIS — C3432 Malignant neoplasm of lower lobe, left bronchus or lung: Secondary | ICD-10-CM | POA: Diagnosis not present

## 2015-12-08 DIAGNOSIS — Z510 Encounter for antineoplastic radiation therapy: Secondary | ICD-10-CM | POA: Diagnosis not present

## 2015-12-09 ENCOUNTER — Ambulatory Visit: Payer: BLUE CROSS/BLUE SHIELD

## 2015-12-09 ENCOUNTER — Ambulatory Visit
Admission: RE | Admit: 2015-12-09 | Discharge: 2015-12-09 | Disposition: A | Discharge status: Home / Self Care | Payer: BLUE CROSS/BLUE SHIELD | Source: Ambulatory Visit | Attending: Radiation Oncology | Admitting: Radiation Oncology

## 2015-12-09 DIAGNOSIS — Z510 Encounter for antineoplastic radiation therapy: Secondary | ICD-10-CM | POA: Diagnosis not present

## 2015-12-09 DIAGNOSIS — C3432 Malignant neoplasm of lower lobe, left bronchus or lung: Secondary | ICD-10-CM | POA: Diagnosis not present

## 2015-12-10 ENCOUNTER — Ambulatory Visit
Admission: RE | Admit: 2015-12-10 | Discharge: 2015-12-10 | Disposition: A | Discharge status: Home / Self Care | Payer: BLUE CROSS/BLUE SHIELD | Source: Ambulatory Visit | Attending: Radiation Oncology | Admitting: Radiation Oncology

## 2015-12-10 ENCOUNTER — Ambulatory Visit: Payer: BLUE CROSS/BLUE SHIELD

## 2015-12-10 DIAGNOSIS — Z510 Encounter for antineoplastic radiation therapy: Secondary | ICD-10-CM | POA: Diagnosis not present

## 2015-12-14 ENCOUNTER — Ambulatory Visit
Admission: RE | Admit: 2015-12-14 | Discharge: 2015-12-14 | Disposition: A | Discharge status: Home / Self Care | Payer: BLUE CROSS/BLUE SHIELD | Source: Ambulatory Visit | Attending: Radiation Oncology | Admitting: Radiation Oncology

## 2015-12-14 ENCOUNTER — Other Ambulatory Visit: Payer: Self-pay | Admitting: *Deleted

## 2015-12-14 ENCOUNTER — Ambulatory Visit (HOSPITAL_BASED_OUTPATIENT_CLINIC_OR_DEPARTMENT_OTHER): Payer: BLUE CROSS/BLUE SHIELD

## 2015-12-14 ENCOUNTER — Other Ambulatory Visit (HOSPITAL_BASED_OUTPATIENT_CLINIC_OR_DEPARTMENT_OTHER): Payer: BLUE CROSS/BLUE SHIELD

## 2015-12-14 ENCOUNTER — Ambulatory Visit: Payer: BLUE CROSS/BLUE SHIELD

## 2015-12-14 VITALS — BP 138/74 | HR 61 | Temp 98.7°F | Resp 16

## 2015-12-14 DIAGNOSIS — C3432 Malignant neoplasm of lower lobe, left bronchus or lung: Secondary | ICD-10-CM

## 2015-12-14 DIAGNOSIS — Z5111 Encounter for antineoplastic chemotherapy: Secondary | ICD-10-CM

## 2015-12-14 DIAGNOSIS — F17210 Nicotine dependence, cigarettes, uncomplicated: Secondary | ICD-10-CM | POA: Diagnosis not present

## 2015-12-14 DIAGNOSIS — I2510 Atherosclerotic heart disease of native coronary artery without angina pectoris: Secondary | ICD-10-CM | POA: Diagnosis not present

## 2015-12-14 DIAGNOSIS — M1990 Unspecified osteoarthritis, unspecified site: Secondary | ICD-10-CM | POA: Diagnosis not present

## 2015-12-14 DIAGNOSIS — N529 Male erectile dysfunction, unspecified: Secondary | ICD-10-CM | POA: Diagnosis not present

## 2015-12-14 DIAGNOSIS — Z510 Encounter for antineoplastic radiation therapy: Secondary | ICD-10-CM | POA: Diagnosis present

## 2015-12-14 DIAGNOSIS — D751 Secondary polycythemia: Secondary | ICD-10-CM | POA: Diagnosis not present

## 2015-12-14 DIAGNOSIS — I700 Atherosclerosis of aorta: Secondary | ICD-10-CM | POA: Diagnosis not present

## 2015-12-14 DIAGNOSIS — I10 Essential (primary) hypertension: Secondary | ICD-10-CM | POA: Diagnosis not present

## 2015-12-14 DIAGNOSIS — J449 Chronic obstructive pulmonary disease, unspecified: Secondary | ICD-10-CM | POA: Diagnosis not present

## 2015-12-14 LAB — CBC WITH DIFFERENTIAL/PLATELET
BASO%: 0.4 % (ref 0.0–2.0)
Basophils Absolute: 0 10*3/uL (ref 0.0–0.1)
EOS%: 1 % (ref 0.0–7.0)
Eosinophils Absolute: 0 10*3/uL (ref 0.0–0.5)
HCT: 42 % (ref 38.4–49.9)
HGB: 14.4 g/dL (ref 13.0–17.1)
LYMPH%: 16.2 % (ref 14.0–49.0)
MCH: 35.5 pg — ABNORMAL HIGH (ref 27.2–33.4)
MCHC: 34.2 g/dL (ref 32.0–36.0)
MCV: 103.8 fL — ABNORMAL HIGH (ref 79.3–98.0)
MONO#: 0.5 10*3/uL (ref 0.1–0.9)
MONO%: 16.4 % — ABNORMAL HIGH (ref 0.0–14.0)
NEUT#: 1.9 10*3/uL (ref 1.5–6.5)
NEUT%: 66 % (ref 39.0–75.0)
Platelets: 81 10*3/uL — ABNORMAL LOW (ref 140–400)
RBC: 4.05 10*6/uL — ABNORMAL LOW (ref 4.20–5.82)
RDW: 13.6 % (ref 11.0–14.6)
WBC: 2.9 10*3/uL — ABNORMAL LOW (ref 4.0–10.3)
lymph#: 0.5 10*3/uL — ABNORMAL LOW (ref 0.9–3.3)

## 2015-12-14 LAB — COMPREHENSIVE METABOLIC PANEL
ALT: 42 U/L (ref 0–55)
AST: 30 U/L (ref 5–34)
Albumin: 3.7 g/dL (ref 3.5–5.0)
Alkaline Phosphatase: 61 U/L (ref 40–150)
Anion Gap: 8 mEq/L (ref 3–11)
BUN: 14.5 mg/dL (ref 7.0–26.0)
CO2: 27 mEq/L (ref 22–29)
Calcium: 9.3 mg/dL (ref 8.4–10.4)
Chloride: 105 mEq/L (ref 98–109)
Creatinine: 0.9 mg/dL (ref 0.7–1.3)
EGFR: >90 mL/min/{1.73_m2} (ref >90)
Glucose: 95 mg/dl (ref 70–140)
Potassium: 4.3 mEq/L (ref 3.5–5.1)
Sodium: 140 mEq/L (ref 136–145)
Total Bilirubin: 0.83 mg/dL (ref 0.20–1.20)
Total Protein: 6.7 g/dL (ref 6.4–8.3)

## 2015-12-14 MED ORDER — SODIUM CHLORIDE 0.9 % IV SOLN
Freq: Once | INTRAVENOUS | Status: AC
  Administered 2015-12-14: 10:00:00 via INTRAVENOUS

## 2015-12-14 MED ORDER — SODIUM CHLORIDE 0.9 % IV SOLN
250.0000 mg | Freq: Once | INTRAVENOUS | Status: AC
  Administered 2015-12-14: 250 mg via INTRAVENOUS
  Filled 2015-12-14: qty 25

## 2015-12-14 MED ORDER — DIPHENHYDRAMINE HCL 50 MG/ML IJ SOLN
INTRAMUSCULAR | Status: AC
  Filled 2015-12-14: qty 1

## 2015-12-14 MED ORDER — PROCHLORPERAZINE MALEATE 10 MG PO TABS: 10.0000 mg | ORAL_TABLET | Freq: Four times a day (QID) | ORAL | Status: DC | PRN

## 2015-12-14 MED ORDER — FAMOTIDINE IN NACL 20-0.9 MG/50ML-% IV SOLN
20.0000 mg | Freq: Once | INTRAVENOUS | Status: AC
  Administered 2015-12-14: 20 mg via INTRAVENOUS

## 2015-12-14 MED ORDER — FAMOTIDINE IN NACL 20-0.9 MG/50ML-% IV SOLN
INTRAVENOUS | Status: AC
  Filled 2015-12-14: qty 50

## 2015-12-14 MED ORDER — SODIUM CHLORIDE 0.9 % IV SOLN
20.0000 mg | Freq: Once | INTRAVENOUS | Status: AC
  Administered 2015-12-14: 20 mg via INTRAVENOUS
  Filled 2015-12-14: qty 2

## 2015-12-14 MED ORDER — PALONOSETRON HCL INJECTION 0.25 MG/5ML
INTRAVENOUS | Status: AC
  Filled 2015-12-14: qty 5

## 2015-12-14 MED ORDER — DIPHENHYDRAMINE HCL 50 MG/ML IJ SOLN
50.0000 mg | Freq: Once | INTRAMUSCULAR | Status: AC
  Administered 2015-12-14: 50 mg via INTRAVENOUS

## 2015-12-14 MED ORDER — SODIUM CHLORIDE 0.9 % IV SOLN
45.0000 mg/m2 | Freq: Once | INTRAVENOUS | Status: AC
  Administered 2015-12-14: 102 mg via INTRAVENOUS
  Filled 2015-12-14: qty 17

## 2015-12-14 MED ORDER — PALONOSETRON HCL INJECTION 0.25 MG/5ML
0.2500 mg | Freq: Once | INTRAVENOUS | Status: AC
  Administered 2015-12-14: 0.25 mg via INTRAVENOUS

## 2015-12-14 NOTE — Patient Instructions (Signed)
[facility] Discharge Instructions for Patients Receiving Chemotherapy  Today you received the following chemotherapy agents:  Taxol and Carboplatin  To help prevent nausea and vomiting after your treatment, we encourage you to take your nausea medication as ordered per MD.   If you develop nausea and vomiting that is not controlled by your nausea medication, call the clinic.   BELOW ARE SYMPTOMS THAT SHOULD BE REPORTED IMMEDIATELY:  *FEVER GREATER THAN 100.5 F  *CHILLS WITH OR WITHOUT FEVER  NAUSEA AND VOMITING THAT IS NOT CONTROLLED WITH YOUR NAUSEA MEDICATION  *UNUSUAL SHORTNESS OF BREATH  *UNUSUAL BRUISING OR BLEEDING  TENDERNESS IN MOUTH AND THROAT WITH OR WITHOUT PRESENCE OF ULCERS  *URINARY PROBLEMS  *BOWEL PROBLEMS  UNUSUAL RASH Items with * indicate a potential emergency and should be followed up as soon as possible.  Feel free to call the clinic you have any questions or concerns. The clinic phone number is [phone].  Please show the CHEMO ALERT CARD at check-in to the Emergency Department and triage nurse.   

## 2015-12-14 NOTE — Progress Notes (Signed)
OK to treat with platelet count of 81 per Dr. Julien Nordmann.  1345-Patient asking for refill for Valtrex.  Pt has 2 days left on 10 day prescription.  Rash to chest remains red, raised and itches per patient.  Dr. Julien Nordmann notified of pt.'s request and condition of rash.  Per Dr. Julien Nordmann, if rash is not better at the end of 10 day Valtrex prescription, pt is to see Dermatologist. Patient instructed of MD orders and has no questions at this time.

## 2015-12-15 ENCOUNTER — Ambulatory Visit
Admission: RE | Admit: 2015-12-15 | Discharge: 2015-12-15 | Disposition: A | Discharge status: Home / Self Care | Payer: BLUE CROSS/BLUE SHIELD | Source: Ambulatory Visit | Attending: Radiation Oncology | Admitting: Radiation Oncology

## 2015-12-15 ENCOUNTER — Encounter: Payer: Self-pay | Admitting: Radiation Oncology

## 2015-12-15 ENCOUNTER — Telehealth: Payer: Self-pay | Admitting: Gastroenterology

## 2015-12-15 ENCOUNTER — Ambulatory Visit: Payer: BLUE CROSS/BLUE SHIELD

## 2015-12-15 VITALS — BP 113/63 | HR 61 | Temp 97.8°F | Ht 71.0 in | Wt 218.3 lb

## 2015-12-15 DIAGNOSIS — Z510 Encounter for antineoplastic radiation therapy: Secondary | ICD-10-CM | POA: Diagnosis not present

## 2015-12-15 DIAGNOSIS — C3432 Malignant neoplasm of lower lobe, left bronchus or lung: Secondary | ICD-10-CM

## 2015-12-15 NOTE — Progress Notes (Signed)
Brian White has completed 25 fractions to his chest.  He reports burning with swallowing.  He is taking Carafate and eating softer foods.  He has lost 6 lbs since 12/07/15.  Orthostatic vitals taken: bp sitting 113/63, hr 61, bp standing 135/66, hr 67. He reports having a more frequent cough with clear sputum.  He denies shortness of breath.  He reports feeling fatigued.  He had chemotherapy yesterday.  The rash on his chest is drying up.  He has one more does of valtrex left and is using sonafine, hydrocortisone cream and calamine lotion.  The skin on his central back is also red.  BP 113/63 mmHg  Pulse 61  Temp(Src) 97.8 F (36.6 C) (Oral)  Ht '5\' 11"'$  (1.803 m)  Wt 218 lb 4.8 oz (99.02 kg)  BMI 30.46 kg/m2  SpO2 100%   Wt Readings from Last 3 Encounters:  12/15/15 218 lb 4.8 oz (99.02 kg)  12/07/15 224 lb 4.8 oz (101.742 kg)  12/06/15 224 lb 4.8 oz (101.742 kg)

## 2015-12-15 NOTE — Telephone Encounter (Signed)
Cancelled procedure per patient request(Jill)

## 2015-12-15 NOTE — Progress Notes (Signed)
  Radiation Oncology         [phone]) [phone] ________________________________  Name: Brian White MRN: [id number]  Date: 12/15/2015  DOB: 25-Mar-1951  Weekly Radiation Therapy Management    ICD-9-CM ICD-10-CM   1. Cancer of lower lobe of left lung (HCC) 162.5 C34.32      Current Dose: 50 Gy     Planned Dose:  60 Gy  Narrative . . . . . . . . The patient presents for routine under treatment assessment.                                   Brian White has completed 25 fractions to his chest. He reports burning with swallowing. He is taking Carafate and is eating softer foods. He has lost 6 lbs since 12/07/15. Orthostatic vitals taken: bp sitting 113/63, hr 61, bp standing 135/66, hr 67. He reports having a more frequent cough with clear sputum. He denies shortness of breath. He reports feeling fatigued. He had chemotherapy yesterday. The rash on his chest is drying up. He has one more does of valtrex left and is using sonafine, hydrocortisone cream and calamine lotion. The skin on his central back is also red.                                 Set-up films were reviewed.                                 The chart was checked. Physical Findings. . .  height is '5\' 11"'$  (1.803 m) and weight is 218 lb 4.8 oz (99.02 kg). His oral temperature is 97.8 F (36.6 C). His blood pressure is 113/63 and his pulse is 61. His oxygen saturation is 100%. . The lungs are clear. The heart has a regular rhythm and rate. Patient has a significant rash along his anterior chest which is in and outside of his radiation field that is slightly better on evaluation today. No drainage noted. Impression . . . . . . . The patient is tolerating radiation. Plan . . . . . . . . . . . . Continue treatment as planned. I advised the patient to eat more protein; such as eggs. He will complete his valtrex as prescribed. ________________________________   Blair Promise, PhD, MD  This document serves as a record of services personally  performed by Gery Pray, MD. It was created on his behalf by Darcus Austin, a trained medical scribe. The creation of this record is based on the scribe's personal observations and the provider's statements to them. This document has been checked and approved by the attending provider.

## 2015-12-15 NOTE — Telephone Encounter (Signed)
That's okay, maybe we can touch base with him in a month to see how he is doing and how he wishes to proceed. Otherwise, if his spot is open on the schedule, perhaps one of the other patient's waiting for a July spot can be pushed up earlier, we can discuss. Thanks

## 2015-12-16 ENCOUNTER — Ambulatory Visit: Payer: BLUE CROSS/BLUE SHIELD

## 2015-12-16 ENCOUNTER — Ambulatory Visit
Admission: RE | Admit: 2015-12-16 | Discharge: 2015-12-16 | Disposition: A | Discharge status: Home / Self Care | Payer: BLUE CROSS/BLUE SHIELD | Source: Ambulatory Visit | Attending: Radiation Oncology | Admitting: Radiation Oncology

## 2015-12-16 DIAGNOSIS — Z510 Encounter for antineoplastic radiation therapy: Secondary | ICD-10-CM | POA: Diagnosis not present

## 2015-12-16 DIAGNOSIS — C3432 Malignant neoplasm of lower lobe, left bronchus or lung: Secondary | ICD-10-CM | POA: Diagnosis not present

## 2015-12-16 NOTE — Telephone Encounter (Signed)
Note in EPIC

## 2015-12-17 ENCOUNTER — Ambulatory Visit
Admission: RE | Admit: 2015-12-17 | Discharge: 2015-12-17 | Disposition: A | Discharge status: Home / Self Care | Payer: BLUE CROSS/BLUE SHIELD | Source: Ambulatory Visit | Attending: Radiation Oncology | Admitting: Radiation Oncology

## 2015-12-17 ENCOUNTER — Ambulatory Visit: Payer: BLUE CROSS/BLUE SHIELD

## 2015-12-17 DIAGNOSIS — Z510 Encounter for antineoplastic radiation therapy: Secondary | ICD-10-CM | POA: Diagnosis not present

## 2015-12-17 DIAGNOSIS — C3432 Malignant neoplasm of lower lobe, left bronchus or lung: Secondary | ICD-10-CM | POA: Diagnosis not present

## 2015-12-17 MED ORDER — SONAFINE EX EMUL
1.0000 "application " | Freq: Once | CUTANEOUS | Status: AC
  Administered 2015-12-17: 1 via TOPICAL

## 2015-12-20 ENCOUNTER — Telehealth: Payer: Self-pay | Admitting: Internal Medicine

## 2015-12-20 ENCOUNTER — Ambulatory Visit: Payer: BLUE CROSS/BLUE SHIELD

## 2015-12-20 ENCOUNTER — Ambulatory Visit (HOSPITAL_BASED_OUTPATIENT_CLINIC_OR_DEPARTMENT_OTHER): Payer: BLUE CROSS/BLUE SHIELD

## 2015-12-20 ENCOUNTER — Ambulatory Visit
Admission: RE | Admit: 2015-12-20 | Discharge: 2015-12-20 | Disposition: A | Discharge status: Home / Self Care | Payer: BLUE CROSS/BLUE SHIELD | Source: Ambulatory Visit | Attending: Radiation Oncology | Admitting: Radiation Oncology

## 2015-12-20 ENCOUNTER — Encounter: Payer: Self-pay | Admitting: Internal Medicine

## 2015-12-20 ENCOUNTER — Other Ambulatory Visit (HOSPITAL_BASED_OUTPATIENT_CLINIC_OR_DEPARTMENT_OTHER): Payer: BLUE CROSS/BLUE SHIELD

## 2015-12-20 ENCOUNTER — Ambulatory Visit (HOSPITAL_BASED_OUTPATIENT_CLINIC_OR_DEPARTMENT_OTHER): Payer: BLUE CROSS/BLUE SHIELD | Admitting: Internal Medicine

## 2015-12-20 VITALS — BP 134/48 | HR 58 | Resp 98 | Ht 71.0 in | Wt 211.7 lb

## 2015-12-20 DIAGNOSIS — Z510 Encounter for antineoplastic radiation therapy: Secondary | ICD-10-CM | POA: Diagnosis not present

## 2015-12-20 DIAGNOSIS — Z5111 Encounter for antineoplastic chemotherapy: Secondary | ICD-10-CM | POA: Diagnosis not present

## 2015-12-20 DIAGNOSIS — R1310 Dysphagia, unspecified: Secondary | ICD-10-CM | POA: Diagnosis not present

## 2015-12-20 DIAGNOSIS — C3432 Malignant neoplasm of lower lobe, left bronchus or lung: Secondary | ICD-10-CM

## 2015-12-20 LAB — CBC WITH DIFFERENTIAL/PLATELET
BASO%: 0.6 % (ref 0.0–2.0)
Basophils Absolute: 0 10*3/uL (ref 0.0–0.1)
EOS%: 1 % (ref 0.0–7.0)
Eosinophils Absolute: 0 10*3/uL (ref 0.0–0.5)
HCT: 42.2 % (ref 38.4–49.9)
HGB: 14.6 g/dL (ref 13.0–17.1)
LYMPH%: 14.5 % (ref 14.0–49.0)
MCH: 35.5 pg — ABNORMAL HIGH (ref 27.2–33.4)
MCHC: 34.5 g/dL (ref 32.0–36.0)
MCV: 102.9 fL — ABNORMAL HIGH (ref 79.3–98.0)
MONO#: 0.3 10*3/uL (ref 0.1–0.9)
MONO%: 10.3 % (ref 0.0–14.0)
NEUT#: 1.9 10*3/uL (ref 1.5–6.5)
NEUT%: 73.6 % (ref 39.0–75.0)
Platelets: 83 10*3/uL — ABNORMAL LOW (ref 140–400)
RBC: 4.1 10*6/uL — ABNORMAL LOW (ref 4.20–5.82)
RDW: 13.9 % (ref 11.0–14.6)
WBC: 2.6 10*3/uL — ABNORMAL LOW (ref 4.0–10.3)
lymph#: 0.4 10*3/uL — ABNORMAL LOW (ref 0.9–3.3)

## 2015-12-20 LAB — COMPREHENSIVE METABOLIC PANEL
ALT: 32 U/L (ref 0–55)
AST: 27 U/L (ref 5–34)
Albumin: 3.8 g/dL (ref 3.5–5.0)
Alkaline Phosphatase: 64 U/L (ref 40–150)
Anion Gap: 9 mEq/L (ref 3–11)
BUN: 16.2 mg/dL (ref 7.0–26.0)
CO2: 26 mEq/L (ref 22–29)
Calcium: 9.3 mg/dL (ref 8.4–10.4)
Chloride: 103 mEq/L (ref 98–109)
Creatinine: 0.8 mg/dL (ref 0.7–1.3)
EGFR: >90 mL/min/{1.73_m2} (ref >90)
Glucose: 93 mg/dl (ref 70–140)
Potassium: 3.8 mEq/L (ref 3.5–5.1)
Sodium: 139 mEq/L (ref 136–145)
Total Bilirubin: 1.66 mg/dL — ABNORMAL HIGH (ref 0.20–1.20)
Total Protein: 6.9 g/dL (ref 6.4–8.3)

## 2015-12-20 MED ORDER — PALONOSETRON HCL INJECTION 0.25 MG/5ML
0.2500 mg | Freq: Once | INTRAVENOUS | Status: AC
  Administered 2015-12-20: 0.25 mg via INTRAVENOUS

## 2015-12-20 MED ORDER — SODIUM CHLORIDE 0.9% FLUSH
10.0000 mL | INTRAVENOUS | Status: DC | PRN
  Filled 2015-12-20: qty 10

## 2015-12-20 MED ORDER — FAMOTIDINE IN NACL 20-0.9 MG/50ML-% IV SOLN
20.0000 mg | Freq: Once | INTRAVENOUS | Status: AC
  Administered 2015-12-20: 20 mg via INTRAVENOUS

## 2015-12-20 MED ORDER — SODIUM CHLORIDE 0.9 % IV SOLN
45.0000 mg/m2 | Freq: Once | INTRAVENOUS | Status: AC
  Administered 2015-12-20: 102 mg via INTRAVENOUS
  Filled 2015-12-20: qty 17

## 2015-12-20 MED ORDER — PALONOSETRON HCL INJECTION 0.25 MG/5ML
INTRAVENOUS | Status: AC
  Filled 2015-12-20: qty 5

## 2015-12-20 MED ORDER — HEPARIN SOD (PORK) LOCK FLUSH 100 UNIT/ML IV SOLN
500.0000 [IU] | Freq: Once | INTRAVENOUS | Status: DC | PRN
  Filled 2015-12-20: qty 5

## 2015-12-20 MED ORDER — SODIUM CHLORIDE 0.9 % IV SOLN
Freq: Once | INTRAVENOUS | Status: AC
  Administered 2015-12-20: 10:00:00 via INTRAVENOUS

## 2015-12-20 MED ORDER — SODIUM CHLORIDE 0.9 % IV SOLN
20.0000 mg | Freq: Once | INTRAVENOUS | Status: AC
Start: 2015-12-20 — End: 2015-12-20
  Administered 2015-12-20: 20 mg via INTRAVENOUS
  Filled 2015-12-20: qty 2

## 2015-12-20 MED ORDER — SODIUM CHLORIDE 0.9 % IV SOLN
250.0000 mg | Freq: Once | INTRAVENOUS | Status: AC
  Administered 2015-12-20: 250 mg via INTRAVENOUS
  Filled 2015-12-20: qty 25

## 2015-12-20 MED ORDER — FAMOTIDINE IN NACL 20-0.9 MG/50ML-% IV SOLN
INTRAVENOUS | Status: AC
  Filled 2015-12-20: qty 50

## 2015-12-20 MED ORDER — DIPHENHYDRAMINE HCL 50 MG/ML IJ SOLN
50.0000 mg | Freq: Once | INTRAMUSCULAR | Status: AC
  Administered 2015-12-20: 50 mg via INTRAVENOUS

## 2015-12-20 MED ORDER — DIPHENHYDRAMINE HCL 50 MG/ML IJ SOLN
INTRAMUSCULAR | Status: AC
  Filled 2015-12-20: qty 1

## 2015-12-20 NOTE — Patient Instructions (Signed)
[facility] Discharge Instructions for Patients Receiving Chemotherapy  Today you received the following chemotherapy agents Taxol and Carboplatin.  To help prevent nausea and vomiting after your treatment, we encourage you to take your nausea medication as prescribed.   If you develop nausea and vomiting that is not controlled by your nausea medication, call the clinic.   BELOW ARE SYMPTOMS THAT SHOULD BE REPORTED IMMEDIATELY:  *FEVER GREATER THAN 100.5 F  *CHILLS WITH OR WITHOUT FEVER  NAUSEA AND VOMITING THAT IS NOT CONTROLLED WITH YOUR NAUSEA MEDICATION  *UNUSUAL SHORTNESS OF BREATH  *UNUSUAL BRUISING OR BLEEDING  TENDERNESS IN MOUTH AND THROAT WITH OR WITHOUT PRESENCE OF ULCERS  *URINARY PROBLEMS  *BOWEL PROBLEMS  UNUSUAL RASH Items with * indicate a potential emergency and should be followed up as soon as possible.  Feel free to call the clinic you have any questions or concerns. The clinic phone number is [phone].  Please show the CHEMO ALERT CARD at check-in to the Emergency Department and triage nurse.   

## 2015-12-20 NOTE — Telephone Encounter (Signed)
Gave and printed appt sched and avs for pt for July  °

## 2015-12-20 NOTE — Progress Notes (Signed)
OK to treat with today's PLT count per MD Mohamed.

## 2015-12-20 NOTE — Progress Notes (Signed)
[place] [phone]   [phone]  OFFICE PROGRESS NOTE  [name], MD [name] [address]  DIAGNOSIS: Stage IIIB (T1b, N3, M0) non-small cell lung cancer favoring adenocarcinoma presented with left lower lobe pulmonary nodule in addition to left hilar and bilateral mediastinal lymphadenopathy in addition to right supraclavicular lymph nodes diagnosed in April 2017.  PRIOR THERAPY: None  CURRENT THERAPY: Concurrent chemoradiation with weekly carboplatin for AUC of 2 and paclitaxel 45 mg/M2 started on 11/15/2015. He is status post 5 cycles.  INTERVAL HISTORY: Brian White 65 y.o. male returns to the clinic today for follow-up visit accompanied by his wife. The patient is feeling fine today with no specific complaints except for odynophagia started recently. The patient denied having any significant chest pain, shortness breath, cough or hemoptysis. He has no nausea or vomiting, no fever or chills. He is tolerating his treatment with concurrent chemoradiation fairly well. He is here today to start cycle #6 of his chemotherapy. He is expected to complete radiotherapy on 12/22/2015.  MEDICAL HISTORY: Past Medical History  Diagnosis Date  . Hypertension   . Arthritis   . CAD (coronary artery disease)     mild MI; pt unaware - seen on stress test  . H/O seasonal allergies   . H/O anaphylactic shock 2013    hives,hypotension,syncope  . Smoker   . ED (erectile dysfunction)   . Polycythemia 01/25/2014  . Allergy   . Thoracic aorta atherosclerosis ([place]) 10/08/2015    Mild by CT 09/2015   . COPD (chronic obstructive pulmonary disease) ([place]) 06/26/2011    Mild centrilobular and paraseptal emphysema by CT 09/2015   . Radiation 09/18/2011 through 11/10/2011    Prostate 7600 cGy 40 sessions, seminal vesicles 5600 cGy 40 sessions    . Shortness of breath dyspnea   . Prostate cancer [facility]) 2013    s/p  radiation therapy March 2013, planned f/u with uro after XRT  . Adenocarcinoma, lung ([place]) 10/08/2015    2.4cm spiculated LLL nodule suspicious for primary bronchogenic carcinoma by CT 09/2015 - adenocarcinoma by endobronchial biopsy  . Herpes zoster 12/06/2015    ALLERGIES:  is allergic to onion.  MEDICATIONS:  Current Outpatient Prescriptions  Medication Sig Dispense Refill  . aspirin 81 MG tablet Take 81 mg by mouth daily. Reported on 12/15/2015    . budesonide-formoterol (SYMBICORT) 160-4.5 MCG/ACT inhaler Inhale 1 puff into the lungs 2 (two) times daily. 1 Inhaler 3  . cetirizine (ZYRTEC) 10 MG chewable tablet Chew 10 mg by mouth daily.    Marland Kitchen EPINEPHrine (EPIPEN) 0.3 mg/0.3 mL DEVI Inject 0.3 mLs (0.3 mg total) into the muscle once. 1 Device prn  . hyaluronate sodium (RADIAPLEXRX) GEL Apply 1 application topically 2 (two) times daily. Reported on 12/15/2015    . ibuprofen (ADVIL,MOTRIN) 200 MG tablet Take 600 mg by mouth every 6 (six) hours as needed (Pain). Reported on 12/15/2015    . losartan-hydrochlorothiazide (HYZAAR) 100-25 MG tablet Take 1 tablet by mouth daily. 90 tablet 3  . prochlorperazine (COMPAZINE) 10 MG tablet Take 1 tablet (10 mg total) by mouth every 6 (six) hours as needed for nausea or vomiting. 30 tablet 0  . sildenafil (VIAGRA) 100 MG tablet Take 0.5-1 tablets (50-100 mg total) by mouth daily as needed for erectile dysfunction. 4 tablet 0  . sucralfate (CARAFATE) 1 GM/10ML suspension Take 10 mLs (1 g total) by mouth 4 (four) times daily -  with  meals and at bedtime. 420 mL 0  . valACYclovir (VALTREX) 500 MG tablet Take 1 tablet (500 mg total) by mouth 2 (two) times daily. 20 tablet 0  . Wound Dressings (SONAFINE) Apply 1 application topically 2 (two) times daily.     No current facility-administered medications for this visit.    SURGICAL HISTORY:  Past Surgical History  Procedure Laterality Date  . Hernia repair Right 2005    with mesh  . Insertion prostate  radiation seed  08/2011    prostate cancer, [place]  . Colonoscopy w/ biopsies and polypectomy  10/11/15    mult TAs, 3cm rectal polypoid lesion; rec further treatment, diverticulosis ([place])  . Endobronchial ultrasound Bilateral 10/18/2015    Procedure: ENDOBRONCHIAL ULTRASOUND;  Surgeon: Javier Glazier, MD;  Location: [facility];  Service: Cardiopulmonary;  Laterality: Bilateral;    REVIEW OF SYSTEMS:  A comprehensive review of systems was negative except for: Gastrointestinal: positive for odynophagia   PHYSICAL EXAMINATION: General appearance: alert, cooperative and no distress Head: Normocephalic, without obvious abnormality, atraumatic Neck: no adenopathy, no JVD, supple, symmetrical, trachea midline and thyroid not enlarged, symmetric, no tenderness/mass/nodules Lymph nodes: Cervical, supraclavicular, and axillary nodes normal. Resp: clear to auscultation bilaterally Back: symmetric, no curvature. ROM normal. No CVA tenderness. Cardio: regular rate and rhythm, S1, S2 normal, no murmur, click, rub or gallop GI: soft, non-tender; bowel sounds normal; no masses,  no organomegaly Extremities: extremities normal, atraumatic, no cyanosis or edema Neurologic: Alert and oriented X 3, normal strength and tone. Normal symmetric reflexes. Normal coordination and gait    ECOG PERFORMANCE STATUS: 1 - Symptomatic but completely ambulatory  Blood pressure 134/48, pulse 58, resp. rate 98, height '5\' 11"'$  (1.803 m), weight 211 lb 11.2 oz (96.026 kg), SpO2 100 %.  LABORATORY DATA: Lab Results  Component Value Date   WBC 2.6* 12/20/2015   HGB 14.6 12/20/2015   HCT 42.2 12/20/2015   MCV 102.9* 12/20/2015   PLT 83* 12/20/2015      Chemistry      Component Value Date/Time   NA 140 12/14/2015 0859   NA 139 08/27/2015 0946   K 4.3 12/14/2015 0859   K 4.5 08/27/2015 0946   CL 99 08/27/2015 0946   CO2 27 12/14/2015 0859   CO2 35* 08/27/2015 0946   BUN 14.5 12/14/2015 0859   BUN 14  10/25/2015 1036   CREATININE 0.9 12/14/2015 0859   CREATININE 0.85 10/25/2015 1036      Component Value Date/Time   CALCIUM 9.3 12/14/2015 0859   CALCIUM 10.0 08/27/2015 0946   ALKPHOS 61 12/14/2015 0859   ALKPHOS 62 08/27/2015 0946   AST 30 12/14/2015 0859   AST 21 08/27/2015 0946   ALT 42 12/14/2015 0859   ALT 20 08/27/2015 0946   BILITOT 0.83 12/14/2015 0859   BILITOT 1.2 08/27/2015 0946       RADIOGRAPHIC STUDIES: No results found.  ASSESSMENT AND PLAN: This is a very pleasant 65 years old white male with stage IIIB non-small cell lung cancer currently undergoing a course of concurrent chemoradiation with weekly carboplatin and paclitaxel is status post 5 cycles. He is tolerating his treatment well with no significant adverse effects except for radiation induced esophagitis with odynophagia. We will proceed with cycle #6 today as a scheduled despite blood count. The patient would come back for follow-up visit in 6 weeks for evaluation with repeat CT scan of the chest for restaging of his disease. For the odynophagia, the patient will continue his  treatment with Carafate. He was advised to call immediately if he has any concerning symptoms in the interval. The patient voices understanding of current disease status and treatment options and is in agreement with the current care plan.  All questions were answered. The patient knows to call the clinic with any problems, questions or concerns. We can certainly see the patient much sooner if necessary.  Disclaimer: This note was dictated with voice recognition software. Similar sounding words can inadvertently be transcribed and may not be corrected upon review.

## 2015-12-21 ENCOUNTER — Ambulatory Visit
Admission: RE | Admit: 2015-12-21 | Discharge: 2015-12-21 | Disposition: A | Discharge status: Home / Self Care | Payer: BLUE CROSS/BLUE SHIELD | Source: Ambulatory Visit | Attending: Radiation Oncology | Admitting: Radiation Oncology

## 2015-12-21 ENCOUNTER — Ambulatory Visit: Payer: BLUE CROSS/BLUE SHIELD

## 2015-12-21 ENCOUNTER — Encounter: Payer: Self-pay | Admitting: Radiation Oncology

## 2015-12-21 VITALS — BP 139/72 | HR 50 | Temp 98.0°F | Resp 16 | Ht 71.0 in | Wt 211.8 lb

## 2015-12-21 DIAGNOSIS — Z510 Encounter for antineoplastic radiation therapy: Secondary | ICD-10-CM | POA: Diagnosis not present

## 2015-12-21 DIAGNOSIS — C3432 Malignant neoplasm of lower lobe, left bronchus or lung: Secondary | ICD-10-CM

## 2015-12-21 MED ORDER — OXYCODONE-ACETAMINOPHEN 5-325 MG PO TABS: 1.0000 | ORAL_TABLET | ORAL | Status: DC | PRN

## 2015-12-21 NOTE — Progress Notes (Signed)
  Radiation Oncology         [phone]) [phone] ________________________________  Name: Brian White MRN: [id number]  Date: 12/21/2015  DOB: Aug 19, 1950  Weekly Radiation Therapy Management    ICD-9-CM ICD-10-CM   1. Cancer of lower lobe of left lung (HCC) 162.5 C34.32     Current Dose: 58 Gy     Planned Dose:  60 Gy  Narrative . . . . . . . . The patient presents for routine under treatment assessment.                                   Brian White has completed 29 fractions to his chest. He reports pain with swallowing. He is taking carafate 4 times a day which is not helping. He is pureeing his food. He has lost 7 lbs since last week. Orthostatic vitals taken: sitting bp 129/69, hr 66, standing bp 139/72, hr 50. He denies having shortness of breath. He reports having a cough with clear sputum. He reports having fatigue. He had his last chemo yesterday. The skin on his chest and upper back is red. He is using sonafine. He does not feel dizzy when he stands.                                 Set-up films were reviewed.                                 The chart was checked. Physical Findings. . .  height is '5\' 11"'$  (1.803 m) and weight is 211 lb 12.8 oz (96.072 kg). His oral temperature is 98 F (36.7 C). His blood pressure is 139/72 and his pulse is 50. His respiration is 16 and oxygen saturation is 100%. . The lungs are clear. The heart has a regular rhythm and rate. Erythema in the skin of the treatment area but no skin breakdown. Impression . . . . . . . The patient is tolerating radiation. Plan . . . . . . . . . . . Marland Kitchen He finishes treatment tomorrow. He has been given a one month follow up appointment. I prescribed him Vicodin for the pain when swallowing.  ________________________________   Blair Promise, PhD, MD    This document serves as a record of services personally performed by Gery Pray, MD. It was created on his behalf by Lendon Collar, a trained medical scribe. The creation of  this record is based on the scribe's personal observations and the provider's statements to them. This document has been checked and approved by the attending provider.

## 2015-12-21 NOTE — Progress Notes (Signed)
Brian White has completed 29 fractions to his chest.  He reports pain with swallowing.  He is taking carafate 4 times a day which is not helping.  He is pureeing his food.  He has lost 7 lbs since last week.  Orthostatic vitals taken: bp sitting 129/69, hr 66, standing bp 139/72, hr 50He denies having shortness of breath.  He reports having a cough with clear sputum.  He reports having fatigue.  He had his last chemo yesterday.  The skin on his chest and upper back is red.  He is using sonafine.  He has been given a one month follow up.  BP 139/72 mmHg  Pulse 50  Temp(Src) 98 F (36.7 C) (Oral)  Resp 16  Ht '5\' 11"'$  (1.803 m)  Wt 211 lb 12.8 oz (96.072 kg)  BMI 29.55 kg/m2  SpO2 100%   Wt Readings from Last 3 Encounters:  12/21/15 211 lb 12.8 oz (96.072 kg)  12/20/15 211 lb 11.2 oz (96.026 kg)  12/15/15 218 lb 4.8 oz (99.02 kg)

## 2015-12-22 ENCOUNTER — Ambulatory Visit
Admission: RE | Admit: 2015-12-22 | Discharge: 2015-12-22 | Disposition: A | Discharge status: Home / Self Care | Payer: BLUE CROSS/BLUE SHIELD | Source: Ambulatory Visit | Attending: Radiation Oncology | Admitting: Radiation Oncology

## 2015-12-22 ENCOUNTER — Encounter: Payer: Self-pay | Admitting: Radiation Oncology

## 2015-12-22 DIAGNOSIS — Z510 Encounter for antineoplastic radiation therapy: Secondary | ICD-10-CM | POA: Diagnosis not present

## 2015-12-22 DIAGNOSIS — C3432 Malignant neoplasm of lower lobe, left bronchus or lung: Secondary | ICD-10-CM | POA: Diagnosis not present

## 2015-12-29 ENCOUNTER — Encounter: Payer: Self-pay | Admitting: Skilled Nursing Facility1

## 2015-12-29 NOTE — Progress Notes (Signed)
Subjective:     Patient ID: Brian White, male   DOB: 1950/12/22, 65 y.o.   MRN: [id number]  HPI   Review of Systems     Objective:   Physical Exam To assist the pt in identifying dietary strategies to gain lost wt.     Assessment:     Pt identified as being malnourished due to lost wt. Pt was contacted via the telephone at [phone]. Pt was napping so Dietitian spoke with his wife Brian White. Pts wife states she will call if she/the pt has any questions.    Plan:     Dietitian left Brian White CSO,RD,LDN number [phone]).

## 2015-12-30 ENCOUNTER — Ambulatory Visit: Payer: BLUE CROSS/BLUE SHIELD

## 2015-12-30 NOTE — Progress Notes (Incomplete)
°  Radiation Oncology         [phone]) [phone] ________________________________  Name: Brian White MRN: [id number]  Date: 12/22/2015  DOB: 1950/11/11  End of Treatment Note  Diagnosis: Clinical T1b,N3,Mx (Stage III-B) Adenocarcinoma of the Left Lower Lobe     Indication for treatment:  Curative with chemotherapy        Radiation treatment dates:   11/10/15- 12/22/15  Site/dose: Chest treated to 60 Gy in 30 fractions   Beams/energy:   Conformal / 10X   Narrative: The patient tolerated radiation treatment relatively well. During treatment he had pain and burning with swallowing, he was given Carafate to address this. He also had erythema in the treatment area and a persistent cough throughout treatment.   Plan: The patient has completed radiation treatment. The patient will return to radiation oncology clinic for routine followup in one month. I advised them to call or return sooner if they have any questions or concerns related to their recovery or treatment.  -----------------------------------  Blair Promise, PhD, MD   This document serves as a record of services personally performed by Gery Pray, MD. It was created on his behalf by Derek Mound, a trained medical scribe. The creation of this record is based on the scribe's personal observations and the provider's statements to them. This document has been checked and approved by the attending provider.

## 2015-12-31 ENCOUNTER — Encounter (HOSPITAL_COMMUNITY): Admission: RE | Payer: Self-pay | Source: Ambulatory Visit

## 2015-12-31 ENCOUNTER — Ambulatory Visit (HOSPITAL_COMMUNITY)
Admission: RE | Admit: 2015-12-31 | Payer: BLUE CROSS/BLUE SHIELD | Source: Ambulatory Visit | Admitting: Gastroenterology

## 2015-12-31 SURGERY — COLONOSCOPY
Anesthesia: Monitor Anesthesia Care

## 2016-01-03 ENCOUNTER — Ambulatory Visit: Payer: BLUE CROSS/BLUE SHIELD | Admitting: Pulmonary Disease

## 2016-01-12 ENCOUNTER — Other Ambulatory Visit: Payer: Self-pay | Admitting: Radiation Oncology

## 2016-01-12 DIAGNOSIS — C3432 Malignant neoplasm of lower lobe, left bronchus or lung: Secondary | ICD-10-CM

## 2016-01-12 MED ORDER — SUCRALFATE 1 GM/10ML PO SUSP: 1.0000 g | Freq: Three times a day (TID) | ORAL | Status: DC

## 2016-01-24 ENCOUNTER — Other Ambulatory Visit (HOSPITAL_BASED_OUTPATIENT_CLINIC_OR_DEPARTMENT_OTHER): Payer: BLUE CROSS/BLUE SHIELD

## 2016-01-24 DIAGNOSIS — C3432 Malignant neoplasm of lower lobe, left bronchus or lung: Secondary | ICD-10-CM | POA: Diagnosis not present

## 2016-01-24 DIAGNOSIS — Z5111 Encounter for antineoplastic chemotherapy: Secondary | ICD-10-CM

## 2016-01-24 LAB — CBC WITH DIFFERENTIAL/PLATELET
BASO%: 0.8 % (ref 0.0–2.0)
Basophils Absolute: 0 10*3/uL (ref 0.0–0.1)
EOS%: 2.4 % (ref 0.0–7.0)
Eosinophils Absolute: 0.1 10*3/uL (ref 0.0–0.5)
HCT: 35.4 % — ABNORMAL LOW (ref 38.4–49.9)
HGB: 12.1 g/dL — ABNORMAL LOW (ref 13.0–17.1)
LYMPH%: 22.3 % (ref 14.0–49.0)
MCH: 34.8 pg — ABNORMAL HIGH (ref 27.2–33.4)
MCHC: 34.2 g/dL (ref 32.0–36.0)
MCV: 101.8 fL — ABNORMAL HIGH (ref 79.3–98.0)
MONO#: 0.7 10*3/uL (ref 0.1–0.9)
MONO%: 16.1 % — ABNORMAL HIGH (ref 0.0–14.0)
NEUT#: 2.5 10*3/uL (ref 1.5–6.5)
NEUT%: 58.4 % (ref 39.0–75.0)
Platelets: 137 10*3/uL — ABNORMAL LOW (ref 140–400)
RBC: 3.48 10*6/uL — ABNORMAL LOW (ref 4.20–5.82)
RDW: 14.5 % (ref 11.0–14.6)
WBC: 4.3 10*3/uL (ref 4.0–10.3)
lymph#: 1 10*3/uL (ref 0.9–3.3)

## 2016-01-24 LAB — COMPREHENSIVE METABOLIC PANEL
ALT: 17 U/L (ref 0–55)
AST: 21 U/L (ref 5–34)
Albumin: 3.5 g/dL (ref 3.5–5.0)
Alkaline Phosphatase: 77 U/L (ref 40–150)
Anion Gap: 10 mEq/L (ref 3–11)
BUN: 15.7 mg/dL (ref 7.0–26.0)
CO2: 27 mEq/L (ref 22–29)
Calcium: 9.4 mg/dL (ref 8.4–10.4)
Chloride: 106 mEq/L (ref 98–109)
Creatinine: 0.9 mg/dL (ref 0.7–1.3)
EGFR: >90 mL/min/{1.73_m2} (ref >90)
Glucose: 101 mg/dl (ref 70–140)
Potassium: 4 mEq/L (ref 3.5–5.1)
Sodium: 143 mEq/L (ref 136–145)
Total Bilirubin: 0.79 mg/dL (ref 0.20–1.20)
Total Protein: 6.9 g/dL (ref 6.4–8.3)

## 2016-01-27 ENCOUNTER — Ambulatory Visit (HOSPITAL_COMMUNITY)
Admission: RE | Admit: 2016-01-27 | Discharge: 2016-01-27 | Disposition: A | Discharge status: Home / Self Care | Payer: BLUE CROSS/BLUE SHIELD | Source: Ambulatory Visit | Attending: Internal Medicine | Admitting: Internal Medicine

## 2016-01-27 ENCOUNTER — Encounter (HOSPITAL_COMMUNITY): Payer: Self-pay

## 2016-01-27 DIAGNOSIS — Z9221 Personal history of antineoplastic chemotherapy: Secondary | ICD-10-CM | POA: Insufficient documentation

## 2016-01-27 DIAGNOSIS — I700 Atherosclerosis of aorta: Secondary | ICD-10-CM | POA: Diagnosis not present

## 2016-01-27 DIAGNOSIS — R590 Localized enlarged lymph nodes: Secondary | ICD-10-CM | POA: Insufficient documentation

## 2016-01-27 DIAGNOSIS — C3432 Malignant neoplasm of lower lobe, left bronchus or lung: Secondary | ICD-10-CM | POA: Insufficient documentation

## 2016-01-27 DIAGNOSIS — Z5111 Encounter for antineoplastic chemotherapy: Secondary | ICD-10-CM

## 2016-01-27 DIAGNOSIS — I2510 Atherosclerotic heart disease of native coronary artery without angina pectoris: Secondary | ICD-10-CM | POA: Insufficient documentation

## 2016-01-27 DIAGNOSIS — I080 Rheumatic disorders of both mitral and aortic valves: Secondary | ICD-10-CM | POA: Insufficient documentation

## 2016-01-27 DIAGNOSIS — I252 Old myocardial infarction: Secondary | ICD-10-CM | POA: Insufficient documentation

## 2016-01-27 MED ORDER — IOPAMIDOL (ISOVUE-300) INJECTION 61%
75.0000 mL | Freq: Once | INTRAVENOUS | Status: AC | PRN
  Administered 2016-01-27: 75 mL via INTRAVENOUS

## 2016-02-01 ENCOUNTER — Ambulatory Visit (HOSPITAL_BASED_OUTPATIENT_CLINIC_OR_DEPARTMENT_OTHER): Payer: BLUE CROSS/BLUE SHIELD | Admitting: Internal Medicine

## 2016-02-01 ENCOUNTER — Telehealth: Payer: Self-pay | Admitting: Internal Medicine

## 2016-02-01 ENCOUNTER — Encounter: Payer: Self-pay | Admitting: Internal Medicine

## 2016-02-01 VITALS — BP 138/70 | HR 52 | Temp 97.8°F | Resp 18 | Ht 71.0 in | Wt 207.0 lb

## 2016-02-01 DIAGNOSIS — C3432 Malignant neoplasm of lower lobe, left bronchus or lung: Secondary | ICD-10-CM

## 2016-02-01 DIAGNOSIS — Z5111 Encounter for antineoplastic chemotherapy: Secondary | ICD-10-CM

## 2016-02-01 NOTE — Telephone Encounter (Signed)
Gave pt cal & avs °

## 2016-02-01 NOTE — Progress Notes (Signed)
[place] [phone]   [phone]  OFFICE PROGRESS NOTE  [name], MD [place] [address]  DIAGNOSIS: Stage IIIB (T1b, N3, M0) non-small cell lung cancer favoring adenocarcinoma presented with left lower lobe pulmonary nodule in addition to left hilar and bilateral mediastinal lymphadenopathy in addition to right supraclavicular lymph nodes diagnosed in April 2017.  PRIOR THERAPY: Concurrent chemoradiation with weekly carboplatin for AUC of 2 and paclitaxel 45 mg/M2 started on 11/15/2015. He is status post 6 cycles. Last dose was getting 12/20/2015 with partial response.   CURRENT THERAPY: Consolidation chemotherapy with carboplatin for AUC of 5 and paclitaxel 175 MG/M2 every 3 weeks with Neulasta support.  INTERVAL HISTORY: Brian White 65 y.o. male returns to the clinic today for follow-up visit accompanied by his wife. The patient is feeling fine today with no specific complaints except for mild odynophagia and dysphagia. He has been enjoying his time at the [facility]. The patient denied having any significant chest pain, shortness breath, cough or hemoptysis. He has no nausea or vomiting, no fever or chills. He is tolerating his treatment with concurrent chemoradiation fairly well. He had repeat CT scan of the chest performed recently and he is here for evaluation and discussion of his scan results.  MEDICAL HISTORY: Past Medical History  Diagnosis Date  . Hypertension   . Arthritis   . CAD (coronary artery disease)     mild MI; pt unaware - seen on stress test  . H/O seasonal allergies   . H/O anaphylactic shock 2013    hives,hypotension,syncope  . Smoker   . ED (erectile dysfunction)   . Polycythemia 01/25/2014  . Allergy   . Thoracic aorta atherosclerosis ([place]) 10/08/2015    Mild by CT 09/2015   . COPD (chronic obstructive pulmonary disease) ([place]) 06/26/2011    Mild centrilobular and paraseptal  emphysema by CT 09/2015   . Radiation 09/18/2011 through 11/10/2011    Prostate 7600 cGy 40 sessions, seminal vesicles 5600 cGy 40 sessions    . Shortness of breath dyspnea   . Herpes zoster 12/06/2015  . Prostate cancer [facility]) 2013    s/p radiation therapy March 2013, planned f/u with uro after XRT  . Adenocarcinoma, lung ([place]) 10/08/2015    2.4cm spiculated LLL nodule suspicious for primary bronchogenic carcinoma by CT 09/2015 - adenocarcinoma by endobronchial biopsy    ALLERGIES:  is allergic to onion.  MEDICATIONS:  Current Outpatient Prescriptions  Medication Sig Dispense Refill  . aspirin 81 MG tablet Take 81 mg by mouth daily. Reported on 12/21/2015    . budesonide-formoterol (SYMBICORT) 160-4.5 MCG/ACT inhaler Inhale 1 puff into the lungs 2 (two) times daily. 1 Inhaler 3  . cetirizine (ZYRTEC) 10 MG chewable tablet Chew 10 mg by mouth daily. Reported on 12/20/2015    . EPINEPHrine (EPIPEN) 0.3 mg/0.3 mL DEVI Inject 0.3 mLs (0.3 mg total) into the muscle once. 1 Device prn  . ibuprofen (ADVIL,MOTRIN) 200 MG tablet Take 600 mg by mouth every 6 (six) hours as needed (Pain). Reported on 12/21/2015    . losartan-hydrochlorothiazide (HYZAAR) 100-25 MG tablet Take 1 tablet by mouth daily. 90 tablet 3  . oxyCODONE-acetaminophen (PERCOCET/ROXICET) 5-325 MG tablet Take 1 tablet by mouth every 4 (four) hours as needed for severe pain. 30 tablet 0  . prochlorperazine (COMPAZINE) 10 MG tablet Take 1 tablet (10 mg total) by mouth every 6 (six) hours as needed for nausea or vomiting. 30 tablet  0  . sildenafil (VIAGRA) 100 MG tablet Take 0.5-1 tablets (50-100 mg total) by mouth daily as needed for erectile dysfunction. 4 tablet 0  . sucralfate (CARAFATE) 1 GM/10ML suspension Take 10 mLs (1 g total) by mouth 4 (four) times daily -  with meals and at bedtime. 420 mL 0  . Wound Dressings (SONAFINE) Apply 1 application topically 2 (two) times daily.     No current facility-administered  medications for this visit.    SURGICAL HISTORY:  Past Surgical History  Procedure Laterality Date  . Hernia repair Right 2005    with mesh  . Insertion prostate radiation seed  08/2011    prostate cancer, [place]  . Colonoscopy w/ biopsies and polypectomy  10/11/15    mult TAs, 3cm rectal polypoid lesion; rec further treatment, diverticulosis ([place])  . Endobronchial ultrasound Bilateral 10/18/2015    Procedure: ENDOBRONCHIAL ULTRASOUND;  Surgeon: Javier Glazier, MD;  Location: [facility];  Service: Cardiopulmonary;  Laterality: Bilateral;    REVIEW OF SYSTEMS:  Constitutional: negative Eyes: negative Ears, nose, mouth, throat, and face: negative Respiratory: positive for wheezing Cardiovascular: negative Gastrointestinal: positive for dysphagia and odynophagia Genitourinary:negative Integument/breast: negative Hematologic/lymphatic: negative Musculoskeletal:negative Neurological: negative Behavioral/Psych: negative Endocrine: negative Allergic/Immunologic: negative   PHYSICAL EXAMINATION: General appearance: alert, cooperative and no distress Head: Normocephalic, without obvious abnormality, atraumatic Neck: no adenopathy, no JVD, supple, symmetrical, trachea midline and thyroid not enlarged, symmetric, no tenderness/mass/nodules Lymph nodes: Cervical, supraclavicular, and axillary nodes normal. Resp: clear to auscultation bilaterally Back: symmetric, no curvature. ROM normal. No CVA tenderness. Cardio: regular rate and rhythm, S1, S2 normal, no murmur, click, rub or gallop GI: soft, non-tender; bowel sounds normal; no masses,  no organomegaly Extremities: extremities normal, atraumatic, no cyanosis or edema Neurologic: Alert and oriented X 3, normal strength and tone. Normal symmetric reflexes. Normal coordination and gait    ECOG PERFORMANCE STATUS: 1 - Symptomatic but completely ambulatory  There were no vitals taken for this visit.  LABORATORY DATA: Lab  Results  Component Value Date   WBC 4.3 01/24/2016   HGB 12.1* 01/24/2016   HCT 35.4* 01/24/2016   MCV 101.8* 01/24/2016   PLT 137* 01/24/2016      Chemistry      Component Value Date/Time   NA 143 01/24/2016 0747   NA 139 08/27/2015 0946   K 4.0 01/24/2016 0747   K 4.5 08/27/2015 0946   CL 99 08/27/2015 0946   CO2 27 01/24/2016 0747   CO2 35* 08/27/2015 0946   BUN 15.7 01/24/2016 0747   BUN 14 10/25/2015 1036   CREATININE 0.9 01/24/2016 0747   CREATININE 0.85 10/25/2015 1036      Component Value Date/Time   CALCIUM 9.4 01/24/2016 0747   CALCIUM 10.0 08/27/2015 0946   ALKPHOS 77 01/24/2016 0747   ALKPHOS 62 08/27/2015 0946   AST 21 01/24/2016 0747   AST 21 08/27/2015 0946   ALT 17 01/24/2016 0747   ALT 20 08/27/2015 0946   BILITOT 0.79 01/24/2016 0747   BILITOT 1.2 08/27/2015 0946       RADIOGRAPHIC STUDIES: Ct Chest W Contrast  01/27/2016  CLINICAL DATA:  65 year old male with history of left-sided lung cancer originally diagnosed in March 2017 status post chemotherapy and radiation therapy. Difficulty swallowing. Additional remote history of prostate cancer diagnosed in 2013 status post radiation therapy. EXAM: CT CHEST WITH CONTRAST TECHNIQUE: Multidetector CT imaging of the chest was performed during intravenous contrast administration. CONTRAST:  12m ISOVUE-300 IOPAMIDOL (ISOVUE-300) INJECTION 61% COMPARISON:  PET-CT 10/13/2015.  Chest CT 10/04/2015. FINDINGS: Cardiovascular: Heart size is mildly enlarged with left ventricular dilatation. There are dystrophic calcifications, extensive myocardial thinning and areas of fibrofatty metaplasia involving the mid ventricle and base of the left ventricle, predominantly in the lateral, inferolateral and inferior wall segments, compatible with extensive scarring from prior left circumflex coronary artery territory myocardial infarction. The inferomedial papillary muscle is also densely calcified. Aortic atherosclerosis, in  addition to left main and 3 vessel coronary artery disease. Please note that although the presence of coronary artery calcium documents the presence of coronary artery disease, the severity of this disease and any potential stenosis cannot be assessed on this non-gated CT examination. Assessment for potential risk factor modification, dietary therapy or pharmacologic therapy may be warranted, if clinically indicated. Moderate thickening calcification of the aortic valve. Mediastinum/Nodes: Multiple low borderline enlarged and enlarged mediastinal and right hilar lymph nodes are noted, decreased in size compared to prior examinations. The largest of these measures 1.3 cm in the low right paratracheal nodal station (previously 2.8 cm). Right hilar lymph node measuring 1 cm in short axis is noted. No left hilar lymphadenopathy. Esophagus is normal in appearance. No axillary lymphadenopathy. Lungs/Pleura: The previously noted left lower lobe lesion is substantially smaller than the prior examination, measuring approximately 1.5 x 1.3 cm on today's study (image 101 of series 5). There are some adjacent areas of ground-glass attenuation and mild septal thickening in the left lower lobe, which are most compatible with evolving postradiation changes. The medial basal segment bronchus leading up to this lesion in the left lower lobe appears occluded by the lesion, but there does appear to be some patency of the distal bronchi, indicating that this occlusion is incomplete. No other new suspicious appearing pulmonary nodules or masses are noted elsewhere in the lungs. No pleural effusions. Mild diffuse bronchial wall thickening. Upper Abdomen: Aortic atherosclerosis. Musculoskeletal: There are no aggressive appearing lytic or blastic lesions noted in the visualized portions of the skeleton. IMPRESSION: 1. Today's study demonstrates a positive response to therapy as evidenced by decreased size of previously noted left lower  lobe pulmonary nodule, and improvement in mediastinal/hilar lymphadenopathy, as detailed above. There are evolving postradiation changes in the left lower lobe. 2. Aortic atherosclerosis, in addition to left main and 3 vessel coronary artery disease. In addition, there is evidence of prior transmural left circumflex coronary artery territory myocardial infarction, with associated left ventricular dilatation. Please note that although the presence of coronary artery calcium documents the presence of coronary artery disease, the severity of this disease and any potential stenosis cannot be assessed on this non-gated CT examination. Assessment for potential risk factor modification, dietary therapy or pharmacologic therapy may be warranted, if clinically indicated. 3. There are calcifications of the aortic valve and mitral subvalvular apparatus. Echocardiographic correlation for evaluation of potential valvular dysfunction may be warranted if clinically indicated. Electronically Signed   By: Vinnie Langton M.D.   On: 01/27/2016 10:02    ASSESSMENT AND PLAN: This is a very pleasant 65 years old white male with stage IIIB non-small cell lung cancer status post course of concurrent chemoradiation with weekly carboplatin and paclitaxel for 6 cycles.  He tolerated his treatment fairly well except for mild odynophagia and dysphagia. The recent CT scan of the chest showed significant improvement in his disease was decrease in the size of the left lower lobe as well as the mediastinal lymphadenopathy. I discussed the scan results and showed the images to the patient and  his wife and give him the option of continuous observation versus proceeding with 3 cycles of consolidation chemotherapy with carboplatin and paclitaxel. The patient is interested in proceeding with the chemotherapy and he will be treated with carboplatin for AUC of 5 and paclitaxel 175 MG/M2 every 3 weeks with Neulasta support. I discussed with him  the adverse effect of the treatment. He is expected to start the first dose of his treatment on 02/07/2016. He would come back for follow-up visit in 4 weeks for evaluation before starting cycle #2. He was advised to call immediately if he has any concerning symptoms in the interval. The patient voices understanding of current disease status and treatment options and is in agreement with the current care plan.  All questions were answered. The patient knows to call the clinic with any problems, questions or concerns. We can certainly see the patient much sooner if necessary.  Disclaimer: This note was dictated with voice recognition software. Similar sounding words can inadvertently be transcribed and may not be corrected upon review.

## 2016-02-02 ENCOUNTER — Telehealth: Payer: Self-pay | Admitting: *Deleted

## 2016-02-02 NOTE — Telephone Encounter (Signed)
Spoke with patient and he is starting chemo again on Monday. He will continue this for 9 weeks. He does not want to schedule a colonoscopy during this time.

## 2016-02-02 NOTE — Telephone Encounter (Signed)
-----   Message from Barron Alvine, RN sent at 01/26/2016 10:39 AM EDT -----   ----- Message -----    From: Hulan Saas, RN    Sent: 01/24/2016      To: Hulan Saas, RN  Call and see if patient is ready to schedule procedure with SA

## 2016-02-04 ENCOUNTER — Ambulatory Visit: Payer: BLUE CROSS/BLUE SHIELD

## 2016-02-07 ENCOUNTER — Other Ambulatory Visit (HOSPITAL_BASED_OUTPATIENT_CLINIC_OR_DEPARTMENT_OTHER): Payer: BLUE CROSS/BLUE SHIELD

## 2016-02-07 ENCOUNTER — Ambulatory Visit (HOSPITAL_BASED_OUTPATIENT_CLINIC_OR_DEPARTMENT_OTHER): Payer: BLUE CROSS/BLUE SHIELD

## 2016-02-07 ENCOUNTER — Ambulatory Visit: Payer: BLUE CROSS/BLUE SHIELD | Admitting: Pulmonary Disease

## 2016-02-07 VITALS — BP 122/71 | HR 52 | Temp 98.5°F | Resp 18

## 2016-02-07 DIAGNOSIS — C3432 Malignant neoplasm of lower lobe, left bronchus or lung: Secondary | ICD-10-CM

## 2016-02-07 DIAGNOSIS — Z5111 Encounter for antineoplastic chemotherapy: Secondary | ICD-10-CM | POA: Diagnosis not present

## 2016-02-07 LAB — COMPREHENSIVE METABOLIC PANEL
ALT: 13 U/L (ref 0–55)
AST: 19 U/L (ref 5–34)
Albumin: 3.4 g/dL — ABNORMAL LOW (ref 3.5–5.0)
Alkaline Phosphatase: 93 U/L (ref 40–150)
Anion Gap: 11 mEq/L (ref 3–11)
BUN: 11.5 mg/dL (ref 7.0–26.0)
CO2: 29 mEq/L (ref 22–29)
Calcium: 9.5 mg/dL (ref 8.4–10.4)
Chloride: 106 mEq/L (ref 98–109)
Creatinine: 0.9 mg/dL (ref 0.7–1.3)
EGFR: 89 mL/min/{1.73_m2} — ABNORMAL LOW (ref >90)
Glucose: 87 mg/dl (ref 70–140)
Potassium: 3.5 mEq/L (ref 3.5–5.1)
Sodium: 145 mEq/L (ref 136–145)
Total Bilirubin: 0.64 mg/dL (ref 0.20–1.20)
Total Protein: 7.1 g/dL (ref 6.4–8.3)

## 2016-02-07 LAB — CBC WITH DIFFERENTIAL/PLATELET
BASO%: 0.5 % (ref 0.0–2.0)
Basophils Absolute: 0 10*3/uL (ref 0.0–0.1)
EOS%: 3.4 % (ref 0.0–7.0)
Eosinophils Absolute: 0.1 10*3/uL (ref 0.0–0.5)
HCT: 36.3 % — ABNORMAL LOW (ref 38.4–49.9)
HGB: 12.7 g/dL — ABNORMAL LOW (ref 13.0–17.1)
LYMPH%: 26.1 % (ref 14.0–49.0)
MCH: 35.1 pg — ABNORMAL HIGH (ref 27.2–33.4)
MCHC: 35 g/dL (ref 32.0–36.0)
MCV: 100.3 fL — ABNORMAL HIGH (ref 79.3–98.0)
MONO#: 0.4 10*3/uL (ref 0.1–0.9)
MONO%: 9.5 % (ref 0.0–14.0)
NEUT#: 2.5 10*3/uL (ref 1.5–6.5)
NEUT%: 60.5 % (ref 39.0–75.0)
Platelets: 140 10*3/uL (ref 140–400)
RBC: 3.62 10*6/uL — ABNORMAL LOW (ref 4.20–5.82)
RDW: 14.4 % (ref 11.0–14.6)
WBC: 4.1 10*3/uL (ref 4.0–10.3)
lymph#: 1.1 10*3/uL (ref 0.9–3.3)

## 2016-02-07 MED ORDER — DIPHENHYDRAMINE HCL 50 MG/ML IJ SOLN
50.0000 mg | Freq: Once | INTRAMUSCULAR | Status: AC
  Administered 2016-02-07: 50 mg via INTRAVENOUS

## 2016-02-07 MED ORDER — FAMOTIDINE IN NACL 20-0.9 MG/50ML-% IV SOLN
INTRAVENOUS | Status: AC
  Filled 2016-02-07: qty 50

## 2016-02-07 MED ORDER — SODIUM CHLORIDE 0.9 % IV SOLN
175.0000 mg/m2 | Freq: Once | INTRAVENOUS | Status: AC
  Administered 2016-02-07: 378 mg via INTRAVENOUS
  Filled 2016-02-07: qty 63

## 2016-02-07 MED ORDER — PALONOSETRON HCL INJECTION 0.25 MG/5ML
INTRAVENOUS | Status: AC
  Filled 2016-02-07: qty 5

## 2016-02-07 MED ORDER — DIPHENHYDRAMINE HCL 50 MG/ML IJ SOLN
INTRAMUSCULAR | Status: AC
  Filled 2016-02-07: qty 1

## 2016-02-07 MED ORDER — SODIUM CHLORIDE 0.9% FLUSH
10.0000 mL | INTRAVENOUS | Status: DC | PRN
  Filled 2016-02-07: qty 10

## 2016-02-07 MED ORDER — SODIUM CHLORIDE 0.9 % IV SOLN
675.5000 mg | Freq: Once | INTRAVENOUS | Status: AC
  Administered 2016-02-07: 680 mg via INTRAVENOUS
  Filled 2016-02-07: qty 68

## 2016-02-07 MED ORDER — FAMOTIDINE IN NACL 20-0.9 MG/50ML-% IV SOLN
20.0000 mg | Freq: Once | INTRAVENOUS | Status: AC
Start: 2016-02-07 — End: 2016-02-07
  Administered 2016-02-07: 20 mg via INTRAVENOUS

## 2016-02-07 MED ORDER — PALONOSETRON HCL INJECTION 0.25 MG/5ML
0.2500 mg | Freq: Once | INTRAVENOUS | Status: AC
Start: 2016-02-07 — End: 2016-02-07
  Administered 2016-02-07: 0.25 mg via INTRAVENOUS

## 2016-02-07 MED ORDER — SODIUM CHLORIDE 0.9 % IV SOLN
Freq: Once | INTRAVENOUS | Status: AC
  Administered 2016-02-07: 14:00:00 via INTRAVENOUS

## 2016-02-07 MED ORDER — HEPARIN SOD (PORK) LOCK FLUSH 100 UNIT/ML IV SOLN
500.0000 [IU] | Freq: Once | INTRAVENOUS | Status: DC | PRN
  Filled 2016-02-07: qty 5

## 2016-02-07 MED ORDER — SODIUM CHLORIDE 0.9 % IV SOLN
20.0000 mg | Freq: Once | INTRAVENOUS | Status: AC
  Administered 2016-02-07: 20 mg via INTRAVENOUS
  Filled 2016-02-07: qty 2

## 2016-02-07 NOTE — Patient Instructions (Signed)
[facility] Discharge Instructions for Patients Receiving Chemotherapy  Today you received the following chemotherapy agents Taxol and Carboplatin.  To help prevent nausea and vomiting after your treatment, we encourage you to take your nausea medication as prescribed.   If you develop nausea and vomiting that is not controlled by your nausea medication, call the clinic.   BELOW ARE SYMPTOMS THAT SHOULD BE REPORTED IMMEDIATELY:  *FEVER GREATER THAN 100.5 F  *CHILLS WITH OR WITHOUT FEVER  NAUSEA AND VOMITING THAT IS NOT CONTROLLED WITH YOUR NAUSEA MEDICATION  *UNUSUAL SHORTNESS OF BREATH  *UNUSUAL BRUISING OR BLEEDING  TENDERNESS IN MOUTH AND THROAT WITH OR WITHOUT PRESENCE OF ULCERS  *URINARY PROBLEMS  *BOWEL PROBLEMS  UNUSUAL RASH Items with * indicate a potential emergency and should be followed up as soon as possible.  Feel free to call the clinic you have any questions or concerns. The clinic phone number is [phone].  Please show the CHEMO ALERT CARD at check-in to the Emergency Department and triage nurse.   

## 2016-02-09 ENCOUNTER — Ambulatory Visit: Payer: BLUE CROSS/BLUE SHIELD | Admitting: Neurology

## 2016-02-09 ENCOUNTER — Encounter: Payer: Self-pay | Admitting: Oncology

## 2016-02-09 ENCOUNTER — Ambulatory Visit (HOSPITAL_BASED_OUTPATIENT_CLINIC_OR_DEPARTMENT_OTHER): Payer: BLUE CROSS/BLUE SHIELD

## 2016-02-09 VITALS — HR 60 | Temp 98.3°F | Resp 20

## 2016-02-09 DIAGNOSIS — Z5189 Encounter for other specified aftercare: Secondary | ICD-10-CM

## 2016-02-09 DIAGNOSIS — C3432 Malignant neoplasm of lower lobe, left bronchus or lung: Secondary | ICD-10-CM | POA: Diagnosis not present

## 2016-02-09 MED ORDER — PEGFILGRASTIM INJECTION 6 MG/0.6ML ~~LOC~~
6.0000 mg | PREFILLED_SYRINGE | Freq: Once | SUBCUTANEOUS | Status: AC
  Administered 2016-02-09: 6 mg via SUBCUTANEOUS
  Filled 2016-02-09: qty 0.6

## 2016-02-09 NOTE — Patient Instructions (Signed)
Pegfilgrastim injection What is this medicine? PEGFILGRASTIM (PEG fil gra stim) is a long-acting granulocyte colony-stimulating factor that stimulates the growth of neutrophils, a type of white blood cell important in the body's fight against infection. It is used to reduce the incidence of fever and infection in patients with certain types of cancer who are receiving chemotherapy that affects the bone marrow, and to increase survival after being exposed to high doses of radiation. This medicine may be used for other purposes; ask your health care provider or pharmacist if you have questions. What should I tell my health care provider before I take this medicine? They need to know if you have any of these conditions: -kidney disease -latex allergy -ongoing radiation therapy -sickle cell disease -skin reactions to acrylic adhesives (On-Body Injector only) -an unusual or allergic reaction to pegfilgrastim, filgrastim, other medicines, foods, dyes, or preservatives -pregnant or trying to get pregnant -breast-feeding How should I use this medicine? This medicine is for injection under the skin. If you get this medicine at home, you will be taught how to prepare and give the pre-filled syringe or how to use the On-body Injector. Refer to the patient Instructions for Use for detailed instructions. Use exactly as directed. Take your medicine at regular intervals. Do not take your medicine more often than directed. It is important that you put your used needles and syringes in a special sharps container. Do not put them in a trash can. If you do not have a sharps container, call your pharmacist or healthcare provider to get one. Talk to your pediatrician regarding the use of this medicine in children. While this drug may be prescribed for selected conditions, precautions do apply. Overdosage: If you think you have taken too much of this medicine contact a poison control center or emergency room at  once. NOTE: This medicine is only for you. Do not share this medicine with others. What if I miss a dose? It is important not to miss your dose. Call your doctor or health care professional if you miss your dose. If you miss a dose due to an On-body Injector failure or leakage, a new dose should be administered as soon as possible using a single prefilled syringe for manual use. What may interact with this medicine? Interactions have not been studied. Give your health care provider a list of all the medicines, herbs, non-prescription drugs, or dietary supplements you use. Also tell them if you smoke, drink alcohol, or use illegal drugs. Some items may interact with your medicine. This list may not describe all possible interactions. Give your health care provider a list of all the medicines, herbs, non-prescription drugs, or dietary supplements you use. Also tell them if you smoke, drink alcohol, or use illegal drugs. Some items may interact with your medicine. What should I watch for while using this medicine? You may need blood work done while you are taking this medicine. If you are going to need a MRI, CT scan, or other procedure, tell your doctor that you are using this medicine (On-Body Injector only). What side effects may I notice from receiving this medicine? Side effects that you should report to your doctor or health care professional as soon as possible: -allergic reactions like skin rash, itching or hives, swelling of the face, lips, or tongue -dizziness -fever -pain, redness, or irritation at site where injected -pinpoint red spots on the skin -red or dark-brown urine -shortness of breath or breathing problems -stomach or side pain, or pain   at the shoulder -swelling -tiredness -trouble passing urine or change in the amount of urine Side effects that usually do not require medical attention (report to your doctor or health care professional if they continue or are  bothersome): -bone pain -muscle pain This list may not describe all possible side effects. Call your doctor for medical advice about side effects. You may report side effects to FDA at 1-800-FDA-1088. Where should I keep my medicine? Keep out of the reach of children. Store pre-filled syringes in a refrigerator between 2 and 8 degrees C (36 and 46 degrees F). Do not freeze. Keep in carton to protect from light. Throw away this medicine if it is left out of the refrigerator for more than 48 hours. Throw away any unused medicine after the expiration date. NOTE: This sheet is a summary. It may not cover all possible information. If you have questions about this medicine, talk to your doctor, pharmacist, or health care provider.    2016, [employer]vier/Gold Standard. (2014-07-23 14:30:14)  

## 2016-02-10 ENCOUNTER — Ambulatory Visit
Admission: RE | Admit: 2016-02-10 | Discharge: 2016-02-10 | Disposition: A | Discharge status: Home / Self Care | Payer: BLUE CROSS/BLUE SHIELD | Source: Ambulatory Visit | Attending: Radiation Oncology | Admitting: Radiation Oncology

## 2016-02-10 VITALS — BP 125/67 | HR 58 | Temp 97.8°F | Ht 71.0 in | Wt 207.3 lb

## 2016-02-10 DIAGNOSIS — Y842 Radiological procedure and radiotherapy as the cause of abnormal reaction of the patient, or of later complication, without mention of misadventure at the time of the procedure: Secondary | ICD-10-CM | POA: Insufficient documentation

## 2016-02-10 DIAGNOSIS — C3432 Malignant neoplasm of lower lobe, left bronchus or lung: Secondary | ICD-10-CM | POA: Insufficient documentation

## 2016-02-10 NOTE — Progress Notes (Signed)
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 He denies having pain.  He continues to have burning in the center of his chest but he said it is about 90% better.  He reports still having trouble swallowing bread.  He denies having shortness of breath.  He reports his cough has improved.  He reports having clear sputum.  He reports feeling fatigued.  He had chemotherapy on Monday.  The skin on his chest and back is intact. The patient reports that he has stopped taking Carafate.  He reports it as "90%" gone.  The patient reports taking 2-3 Advil "occasionally." The patient reports that he has not been suffering any ill effects from his chemotherapy, but he does report some soreness in his bones at the injection site, to which he indicates that he may take Zyrtec to treat symptomatically.     ALLERGIES:  is allergic to onion.  Meds: Current Outpatient Prescriptions  Medication Sig Dispense Refill  . aspirin 81 MG tablet Take 81 mg by mouth daily. Reported on 12/21/2015    . budesonide-formoterol (SYMBICORT) 160-4.5 MCG/ACT inhaler Inhale 1 puff into the lungs 2 (two) times daily. 1 Inhaler 3  . cetirizine (ZYRTEC) 10 MG chewable tablet Chew 10 mg by mouth daily. Reported on 12/20/2015    . EPINEPHrine (EPIPEN) 0.3 mg/0.3 mL DEVI Inject 0.3 mLs (0.3 mg total) into the muscle once. 1 Device prn  . ibuprofen (ADVIL,MOTRIN) 200 MG tablet Take 600 mg by mouth every 6 (six) hours as needed (Pain). Reported on 12/21/2015    . losartan-hydrochlorothiazide (HYZAAR) 100-25 MG tablet Take 1 tablet by mouth daily. 90 tablet 3    . prochlorperazine (COMPAZINE) 10 MG tablet Take 1 tablet (10 mg total) by mouth every 6 (six) hours as needed for nausea or vomiting. 30 tablet 0  . sildenafil (VIAGRA) 100 MG tablet Take 0.5-1 tablets (50-100 mg total) by mouth daily as needed for erectile dysfunction. 4 tablet 0  . oxyCODONE-acetaminophen (PERCOCET/ROXICET) 5-325 MG tablet Take 1 tablet by mouth every 4 (four) hours as needed for severe pain. (Patient not taking: Reported on 02/10/2016) 30 tablet 0  . sucralfate (CARAFATE) 1 GM/10ML suspension Take 10 mLs (1 g total) by mouth 4 (four) times daily -  with meals and at bedtime. (Patient not taking: Reported on 02/10/2016) 420 mL 0  . Wound Dressings (SONAFINE) Apply 1 application topically 2 (two) times daily.     No current facility-administered medications for this encounter.     Physical Findings: The patient is in no acute distress. Patient is alert and oriented.  height is '5\' 11"'$  (1.803 m) and weight is 207 lb 4.8 oz (94 kg). His oral temperature is 97.8 F (36.6 C). His blood pressure is 125/67 and his pulse is 58 (abnormal). His oxygen saturation is 99%. .  Lungs are clear to auscultation bilaterally. Heart has regular rate and rhythm. No palpable cervical, supraclavicular, or axillary adenopathy. Abdomen soft, non-tender, normal bowel sounds.   .   Lab Findings: Lab Results  Component Value Date   WBC 4.1 02/07/2016  HGB 12.7 (L) 02/07/2016   HCT 36.3 (L) 02/07/2016   MCV 100.3 (H) 02/07/2016   PLT 140 02/07/2016    Radiographic Findings: Ct Chest W Contrast  Result Date: 01/27/2016 CLINICAL DATA:  65 year old male with history of left-sided lung cancer originally diagnosed in March 2017 status post chemotherapy and radiation therapy. Difficulty swallowing. Additional remote history of prostate cancer diagnosed in 2013 status post radiation therapy. EXAM: CT CHEST WITH CONTRAST TECHNIQUE: Multidetector CT imaging of the chest was performed during intravenous  contrast administration. CONTRAST:  24m ISOVUE-300 IOPAMIDOL (ISOVUE-300) INJECTION 61% COMPARISON:  PET-CT 10/13/2015.  Chest CT 10/04/2015. FINDINGS: Cardiovascular: Heart size is mildly enlarged with left ventricular dilatation. There are dystrophic calcifications, extensive myocardial thinning and areas of fibrofatty metaplasia involving the mid ventricle and base of the left ventricle, predominantly in the lateral, inferolateral and inferior wall segments, compatible with extensive scarring from prior left circumflex coronary artery territory myocardial infarction. The inferomedial papillary muscle is also densely calcified. Aortic atherosclerosis, in addition to left main and 3 vessel coronary artery disease. Please note that although the presence of coronary artery calcium documents the presence of coronary artery disease, the severity of this disease and any potential stenosis cannot be assessed on this non-gated CT examination. Assessment for potential risk factor modification, dietary therapy or pharmacologic therapy may be warranted, if clinically indicated. Moderate thickening calcification of the aortic valve. Mediastinum/Nodes: Multiple low borderline enlarged and enlarged mediastinal and right hilar lymph nodes are noted, decreased in size compared to prior examinations. The largest of these measures 1.3 cm in the low right paratracheal nodal station (previously 2.8 cm). Right hilar lymph node measuring 1 cm in short axis is noted. No left hilar lymphadenopathy. Esophagus is normal in appearance. No axillary lymphadenopathy. Lungs/Pleura: The previously noted left lower lobe lesion is substantially smaller than the prior examination, measuring approximately 1.5 x 1.3 cm on today's study (image 101 of series 5). There are some adjacent areas of ground-glass attenuation and mild septal thickening in the left lower lobe, which are most compatible with evolving postradiation changes. The medial basal  segment bronchus leading up to this lesion in the left lower lobe appears occluded by the lesion, but there does appear to be some patency of the distal bronchi, indicating that this occlusion is incomplete. No other new suspicious appearing pulmonary nodules or masses are noted elsewhere in the lungs. No pleural effusions. Mild diffuse bronchial wall thickening. Upper Abdomen: Aortic atherosclerosis. Musculoskeletal: There are no aggressive appearing lytic or blastic lesions noted in the visualized portions of the skeleton. IMPRESSION: 1. Today's study demonstrates a positive response to therapy as evidenced by decreased size of previously noted left lower lobe pulmonary nodule, and improvement in mediastinal/hilar lymphadenopathy, as detailed above. There are evolving postradiation changes in the left lower lobe. 2. Aortic atherosclerosis, in addition to left main and 3 vessel coronary artery disease. In addition, there is evidence of prior transmural left circumflex coronary artery territory myocardial infarction, with associated left ventricular dilatation. Please note that although the presence of coronary artery calcium documents the presence of coronary artery disease, the severity of this disease and any potential stenosis cannot be assessed on this non-gated CT examination. Assessment for potential risk factor modification, dietary therapy or pharmacologic therapy may be warranted, if clinically indicated. 3. There are calcifications of the aortic valve and mitral subvalvular apparatus. Echocardiographic correlation for evaluation of potential valvular dysfunction may be warranted if clinically indicated. Electronically Signed   By: DQuillian Quince  Entrikin M.D.   On: 01/27/2016 10:02    Impression:  The patient is recovering from the effects of radiation. Good partial response to his initial course of treatment. The patient has started on full dose systemic chemotherapy and is tolerating well thus far.  Plan:   I will release this patient to the care of Dr. Julien Nordmann.  I will follow up appropriately should the patient require additional radiation therapy. Call or return to clinic prn if these symptoms worsen or fail to improve as anticipated.   ____________________________________  This document serves as a record of services personally performed by Gery Pray, MD. It was created on his behalf by Truddie Hidden, a trained medical scribe. The creation of this record is based on the scribe's personal observations and the provider's statements to them. This document has been checked and approved by the attending provider.

## 2016-02-10 NOTE — Telephone Encounter (Signed)
Ok, can you help set up a clinic appointment with me so I can discuss it further with him, in the next 1-2 months? thanks

## 2016-02-10 NOTE — Progress Notes (Signed)
Brian White here for follow up.  He denies having pain.  He continues to have burning in the center of his chest but he said it is about 90% better.  He reports still having trouble swallowing bread.  He denies having shortness of breath.  He reports his cough has improved.  He reports having clear sputum.  He reports feeling fatigued.  He had chemotherapy on Monday.  The skin on his chest and back is intact.  BP 125/67 (BP Location: Right Arm, Patient Position: Sitting)   Pulse (!) 58   Temp 97.8 F (36.6 C) (Oral)   Ht '5\' 11"'$  (1.803 m)   Wt 207 lb 4.8 oz (94 kg)   SpO2 99%   BMI 28.91 kg/m    Wt Readings from Last 3 Encounters:  02/10/16 207 lb 4.8 oz (94 kg)  02/01/16 207 lb (93.9 kg)  12/21/15 211 lb 12.8 oz (96.1 kg)

## 2016-02-11 ENCOUNTER — Encounter: Payer: Self-pay | Admitting: *Deleted

## 2016-02-11 NOTE — Telephone Encounter (Signed)
Scheduled f/u OV on 03/22/16 at 2:30 PM with Dr. Havery Moros. Letter mailed to patient.

## 2016-02-14 ENCOUNTER — Telehealth: Payer: Self-pay | Admitting: Medical Oncology

## 2016-02-14 ENCOUNTER — Other Ambulatory Visit (HOSPITAL_BASED_OUTPATIENT_CLINIC_OR_DEPARTMENT_OTHER): Payer: BLUE CROSS/BLUE SHIELD

## 2016-02-14 DIAGNOSIS — C3432 Malignant neoplasm of lower lobe, left bronchus or lung: Secondary | ICD-10-CM | POA: Diagnosis not present

## 2016-02-14 LAB — CBC WITH DIFFERENTIAL/PLATELET
BASO%: 0.5 % (ref 0.0–2.0)
Basophils Absolute: 0 10*3/uL (ref 0.0–0.1)
EOS%: 6.3 % (ref 0.0–7.0)
Eosinophils Absolute: 0.1 10*3/uL (ref 0.0–0.5)
HCT: 30.8 % — ABNORMAL LOW (ref 38.4–49.9)
HGB: 10.8 g/dL — ABNORMAL LOW (ref 13.0–17.1)
LYMPH%: 50.5 % — ABNORMAL HIGH (ref 14.0–49.0)
MCH: 34.8 pg — ABNORMAL HIGH (ref 27.2–33.4)
MCHC: 35.1 g/dL (ref 32.0–36.0)
MCV: 99.4 fL — ABNORMAL HIGH (ref 79.3–98.0)
MONO#: 0.3 10*3/uL (ref 0.1–0.9)
MONO%: 17.7 % — ABNORMAL HIGH (ref 0.0–14.0)
NEUT#: 0.5 10*3/uL — CL (ref 1.5–6.5)
NEUT%: 25 % — ABNORMAL LOW (ref 39.0–75.0)
Platelets: 55 10*3/uL — ABNORMAL LOW (ref 140–400)
RBC: 3.1 10*6/uL — ABNORMAL LOW (ref 4.20–5.82)
RDW: 13.7 % (ref 11.0–14.6)
WBC: 1.9 10*3/uL — ABNORMAL LOW (ref 4.0–10.3)
lymph#: 1 10*3/uL (ref 0.9–3.3)
nRBC: 0 % (ref 0–0)

## 2016-02-14 LAB — COMPREHENSIVE METABOLIC PANEL
ALT: 28 U/L (ref 0–55)
AST: 25 U/L (ref 5–34)
Albumin: 3.4 g/dL — ABNORMAL LOW (ref 3.5–5.0)
Alkaline Phosphatase: 85 U/L (ref 40–150)
Anion Gap: 9 mEq/L (ref 3–11)
BUN: 18.1 mg/dL (ref 7.0–26.0)
CO2: 28 mEq/L (ref 22–29)
Calcium: 9.6 mg/dL (ref 8.4–10.4)
Chloride: 104 mEq/L (ref 98–109)
Creatinine: 0.8 mg/dL (ref 0.7–1.3)
EGFR: >90 mL/min/{1.73_m2} (ref >90)
Glucose: 99 mg/dl (ref 70–140)
Potassium: 3.7 mEq/L (ref 3.5–5.1)
Sodium: 141 mEq/L (ref 136–145)
Total Bilirubin: 0.79 mg/dL (ref 0.20–1.20)
Total Protein: 6.6 g/dL (ref 6.4–8.3)

## 2016-02-14 NOTE — Telephone Encounter (Signed)
Wife called to report that pt fainted and fell Friday night and " busted his head" . His BP was low. EMS came took care of pt on site -he did not go to ED. Should he continue his BP med ??. BP today is 140/70.Note to mohamed.

## 2016-02-16 ENCOUNTER — Telehealth: Payer: Self-pay | Admitting: Medical Oncology

## 2016-02-16 NOTE — Telephone Encounter (Signed)
-----   Message from [name], MD sent at 02/15/2016  9:54 PM EDT ----- Regarding: RE: continue BP med He needs to call his PCP. ----- Message ----- From: Ardeen Garland, RN Sent: 02/14/2016   9:13 AM To: [name], MD Subject: continue BP med                                Wife called to report that pt fainted and fell Friday night and " busted his head" . "His BP was low 96/50". EMS came took care of pt on site -he did not go to ED. Should he continue his BP med ??. BP today is 140/70.

## 2016-02-16 NOTE — Telephone Encounter (Signed)
Left message on wife's phone to contact PCP re BP medication

## 2016-02-21 ENCOUNTER — Other Ambulatory Visit (HOSPITAL_BASED_OUTPATIENT_CLINIC_OR_DEPARTMENT_OTHER): Payer: BLUE CROSS/BLUE SHIELD

## 2016-02-21 ENCOUNTER — Telehealth: Payer: Self-pay | Admitting: Medical Oncology

## 2016-02-21 DIAGNOSIS — C3432 Malignant neoplasm of lower lobe, left bronchus or lung: Secondary | ICD-10-CM

## 2016-02-21 LAB — CBC WITH DIFFERENTIAL/PLATELET
BASO%: 0.2 % (ref 0.0–2.0)
Basophils Absolute: 0 10*3/uL (ref 0.0–0.1)
EOS%: 0.1 % (ref 0.0–7.0)
Eosinophils Absolute: 0 10*3/uL (ref 0.0–0.5)
HCT: 32.6 % — ABNORMAL LOW (ref 38.4–49.9)
HGB: 10.9 g/dL — ABNORMAL LOW (ref 13.0–17.1)
LYMPH%: 10.7 % — ABNORMAL LOW (ref 14.0–49.0)
MCH: 34.1 pg — ABNORMAL HIGH (ref 27.2–33.4)
MCHC: 33.4 g/dL (ref 32.0–36.0)
MCV: 101.9 fL — ABNORMAL HIGH (ref 79.3–98.0)
MONO#: 0.9 10*3/uL (ref 0.1–0.9)
MONO%: 7.4 % (ref 0.0–14.0)
NEUT#: 9.6 10*3/uL — ABNORMAL HIGH (ref 1.5–6.5)
NEUT%: 81.6 % — ABNORMAL HIGH (ref 39.0–75.0)
Platelets: 66 10*3/uL — ABNORMAL LOW (ref 140–400)
RBC: 3.2 10*6/uL — ABNORMAL LOW (ref 4.20–5.82)
RDW: 14.7 % — ABNORMAL HIGH (ref 11.0–14.6)
WBC: 11.7 10*3/uL — ABNORMAL HIGH (ref 4.0–10.3)
lymph#: 1.3 10*3/uL (ref 0.9–3.3)
nRBC: 0 % (ref 0–0)

## 2016-02-21 LAB — COMPREHENSIVE METABOLIC PANEL
ALT: 14 U/L (ref 0–55)
AST: 18 U/L (ref 5–34)
Albumin: 3.5 g/dL (ref 3.5–5.0)
Alkaline Phosphatase: 111 U/L (ref 40–150)
Anion Gap: 11 mEq/L (ref 3–11)
BUN: 8.7 mg/dL (ref 7.0–26.0)
CO2: 24 mEq/L (ref 22–29)
Calcium: 9.6 mg/dL (ref 8.4–10.4)
Chloride: 103 mEq/L (ref 98–109)
Creatinine: 0.9 mg/dL (ref 0.7–1.3)
EGFR: >90 mL/min/{1.73_m2} (ref >90)
Glucose: 103 mg/dl (ref 70–140)
Potassium: 4.2 mEq/L (ref 3.5–5.1)
Sodium: 137 mEq/L (ref 136–145)
Total Bilirubin: 0.98 mg/dL (ref 0.20–1.20)
Total Protein: 6.9 g/dL (ref 6.4–8.3)

## 2016-02-21 NOTE — Telephone Encounter (Signed)
Left message for pt to return to lab because could not run cbc due to too many clumps.

## 2016-02-22 ENCOUNTER — Ambulatory Visit (INDEPENDENT_AMBULATORY_CARE_PROVIDER_SITE_OTHER): Payer: BLUE CROSS/BLUE SHIELD | Admitting: Family Medicine

## 2016-02-22 ENCOUNTER — Encounter: Payer: Self-pay | Admitting: Family Medicine

## 2016-02-22 ENCOUNTER — Telehealth: Payer: Self-pay | Admitting: Family Medicine

## 2016-02-22 DIAGNOSIS — B029 Zoster without complications: Secondary | ICD-10-CM | POA: Diagnosis not present

## 2016-02-22 DIAGNOSIS — I2510 Atherosclerotic heart disease of native coronary artery without angina pectoris: Secondary | ICD-10-CM

## 2016-02-22 MED ORDER — VALACYCLOVIR HCL 1 G PO TABS: 1000.0000 mg | ORAL_TABLET | Freq: Three times a day (TID) | ORAL | 0 refills | Status: DC

## 2016-02-22 NOTE — Telephone Encounter (Signed)
Noted. Thanks.

## 2016-02-22 NOTE — Telephone Encounter (Signed)
[facility] Patient Name: Brian White DOB: 1951-04-04 Initial Comment Caller states taking chemo, broke out in a rash on chest Nurse Assessment Nurse: Dimas Chyle, RN, [name] Date/Time Eilene Ghazi Time): 02/22/2016 12:18:42 PM Confirm and document reason for call. If symptomatic, describe symptoms. You must click the next button to save text entered. ---Caller states taking chemo, broke out in a rash on chest. Last chemo was 2 weeks ago. Being treated for lung cancer. Has had shingles previously and rash looks similar. Rash goes from chest to under breast and looks like small oblong blisters. No fever. Has the patient traveled out of the country within the last 30 days? ---No Does the patient have any new or worsening symptoms? ---Yes Will a triage be completed? ---Yes Related visit to physician within the last 2 weeks? ---Yes Does the PT have any chronic conditions? (i.e. diabetes, asthma, etc.) ---Yes List chronic conditions. ---Lung cancer Is this a behavioral health or substance abuse call? ---No Guidelines Guideline Title Affirmed Question Affirmed Notes Shingles [1] Shingles rash (matches SYMPTOMS) AND [2] weak immune system (e.g., HIV positive, cancer chemotherapy, chronic steroid treatment, splenectomy) AND [3] NOT taking antiviral medication Final Disposition User See Physician within 4 Hours (or PCP triage) Dimas Chyle, RN, [name] Comments Lung cancer, HTN Referrals REFERRED TO PCP OFFICE Disagree/Comply: Comply

## 2016-02-22 NOTE — Patient Instructions (Signed)
Take the medication as prescribed.  Follow up closely with Dr. Darnell Level.  Take care  Dr. Lacinda Axon

## 2016-02-22 NOTE — Progress Notes (Signed)
Subjective:  Patient ID: Brian White, male    DOB: 1950/08/07  Age: 65 y.o. MRN: [id number]  CC: Rash, ? Shingles  HPI:  65 year old male who is currently undergoing treatment for lung cancer presents with the above complaint.  Patient states that he noticed a rash on his left chest earlier this morning. He reports associated pain and itching. Patient has a history of shingles that was treated previously by his oncologist. No associated fevers or chills. No known exposures. No known exacerbating or relieving factors. No other complaints this time.  Social Hx   Social History   Social History  . Marital status: Married    Spouse name: N/A  . Number of children: Y  . Years of education: N/A   Occupational History  . retired    Social History Main Topics  . Smoking status: Former Smoker    Packs/day: 1.00    Years: 49.00    Types: Cigarettes    Start date: 01/16/1967    Quit date: 11/06/2015  . Smokeless tobacco: Never Used     Comment: Peak rate of 2ppd  . Alcohol use 0.6 oz/week    1 Shots of liquor per week     Comment: 2-3 beers or mixed drinks daily    . Drug use:     Types: Marijuana     Comment: marijuana occasional weekly  . Sexual activity: Yes    Birth control/ protection: None   Other Topics Concern  . None   Social History Narrative   Lives with wife, dogs, chickens and goats   Occupation: retired.  On disability for herinated discs in back   Edu: HS   Activity: works outside [employer], takes care of farm animals   Diet: good water, fruits/vegetables daily      [facility]:   Originally from [place]. 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 No known asbestos exposure. Questionable mold exposure. Has 2 dogs currently. No recent bird exposure. Previously had chickens.    Review of Systems  Constitutional: Negative for fever.  Skin: Positive for rash.   Objective:  BP (!) 141/77  (BP Location: Right Arm, Patient Position: Sitting, Cuff Size: Normal)   Pulse 86   Temp 98.8 F (37.1 C) (Oral)   Wt 206 lb (93.4 kg)   SpO2 100%   BMI 28.73 kg/m   BP/Weight 02/22/2016 02/10/2016 03/17/5175  Systolic BP 160 737 106  Diastolic BP 77 67 71  Wt. (Lbs) 206 207.3 -  BMI 28.73 28.91 -   Physical Exam  Constitutional: He is oriented to person, place, and time. He appears well-developed. No distress.  Pulmonary/Chest: Effort normal.  Neurological: He is alert and oriented to person, place, and time.  Skin:  Vesicular rash noted on the left side of the chest below the nipple.  Psychiatric: He has a normal mood and affect.  Vitals reviewed.  Lab Results  Component Value Date   WBC 11.7 (H) 02/21/2016   HGB 10.9 (L) 02/21/2016   HCT 32.6 (L) 02/21/2016   PLT 66 (L) 02/21/2016   GLUCOSE 103 02/21/2016   CHOL 202 (H) 08/27/2015   TRIG 91.0 08/27/2015   HDL 60.00 08/27/2015   LDLDIRECT 149.6 11/15/2012   LDLCALC 123 (H) 08/27/2015   ALT 14 02/21/2016   AST 18 02/21/2016   NA 137 02/21/2016   K 4.2 02/21/2016   CL 99 08/27/2015  CREATININE 0.9 02/21/2016   BUN 8.7 02/21/2016   CO2 24 02/21/2016   TSH 0.88 08/27/2015   PSA 1.66 08/27/2015   INR 1.01 11/08/2015    Assessment & Plan:   Problem List Items Addressed This Visit    Herpes zoster    Recurrence/exacerbation. Rash consistent with herpes zoster. Treating with Valtrex.      Relevant Medications   valACYclovir (VALTREX) 1000 MG tablet    Other Visit Diagnoses   None.     Meds ordered this encounter  Medications  . valACYclovir (VALTREX) 1000 MG tablet    Sig: Take 1 tablet (1,000 mg total) by mouth 3 (three) times daily.    Dispense:  30 tablet    Refill:  0   Follow-up: PRN  [place]

## 2016-02-22 NOTE — Telephone Encounter (Signed)
Pt has appt with Dr Lacinda Axon on 02/22/16 at 4 pm.

## 2016-02-22 NOTE — Assessment & Plan Note (Signed)
Recurrence/exacerbation. Rash consistent with herpes zoster. Treating with Valtrex.

## 2016-02-22 NOTE — Progress Notes (Signed)
Pre visit review using our clinic review tool, if applicable. No additional management support is needed unless otherwise documented below in the visit note. 

## 2016-02-22 NOTE — Telephone Encounter (Signed)
Please assist this is your patient from [facility], thanks

## 2016-02-28 ENCOUNTER — Other Ambulatory Visit (HOSPITAL_BASED_OUTPATIENT_CLINIC_OR_DEPARTMENT_OTHER): Payer: BLUE CROSS/BLUE SHIELD

## 2016-02-28 ENCOUNTER — Ambulatory Visit (HOSPITAL_BASED_OUTPATIENT_CLINIC_OR_DEPARTMENT_OTHER): Payer: BLUE CROSS/BLUE SHIELD | Admitting: Internal Medicine

## 2016-02-28 ENCOUNTER — Ambulatory Visit: Payer: BLUE CROSS/BLUE SHIELD

## 2016-02-28 ENCOUNTER — Encounter: Payer: Self-pay | Admitting: Internal Medicine

## 2016-02-28 ENCOUNTER — Telehealth: Payer: Self-pay | Admitting: Internal Medicine

## 2016-02-28 VITALS — BP 122/72 | HR 64 | Temp 98.7°F | Resp 18 | Ht 71.0 in | Wt 202.3 lb

## 2016-02-28 DIAGNOSIS — C3432 Malignant neoplasm of lower lobe, left bronchus or lung: Secondary | ICD-10-CM

## 2016-02-28 DIAGNOSIS — B029 Zoster without complications: Secondary | ICD-10-CM | POA: Diagnosis not present

## 2016-02-28 DIAGNOSIS — Z5111 Encounter for antineoplastic chemotherapy: Secondary | ICD-10-CM

## 2016-02-28 LAB — COMPREHENSIVE METABOLIC PANEL WITH GFR
ALT: 16 U/L (ref 0–55)
AST: 20 U/L (ref 5–34)
Albumin: 3.4 g/dL — ABNORMAL LOW (ref 3.5–5.0)
Alkaline Phosphatase: 103 U/L (ref 40–150)
Anion Gap: 9 meq/L (ref 3–11)
BUN: 16 mg/dL (ref 7.0–26.0)
CO2: 24 meq/L (ref 22–29)
Calcium: 9.8 mg/dL (ref 8.4–10.4)
Chloride: 104 meq/L (ref 98–109)
Creatinine: 0.8 mg/dL (ref 0.7–1.3)
EGFR: >90 ml/min/1.73 m2
Glucose: 92 mg/dL (ref 70–140)
Potassium: 3.8 meq/L (ref 3.5–5.1)
Sodium: 138 meq/L (ref 136–145)
Total Bilirubin: 0.53 mg/dL (ref 0.20–1.20)
Total Protein: 7.3 g/dL (ref 6.4–8.3)

## 2016-02-28 LAB — CBC WITH DIFFERENTIAL/PLATELET
BASO%: 0.8 % (ref 0.0–2.0)
Basophils Absolute: 0 10e3/uL (ref 0.0–0.1)
EOS%: 0.7 % (ref 0.0–7.0)
Eosinophils Absolute: 0 10e3/uL (ref 0.0–0.5)
HCT: 33.2 % — ABNORMAL LOW (ref 38.4–49.9)
HGB: 11.1 g/dL — ABNORMAL LOW (ref 13.0–17.1)
LYMPH%: 15.1 % (ref 14.0–49.0)
MCH: 34.8 pg — ABNORMAL HIGH (ref 27.2–33.4)
MCHC: 33.4 g/dL (ref 32.0–36.0)
MCV: 104.2 fL — ABNORMAL HIGH (ref 79.3–98.0)
MONO#: 0.5 10e3/uL (ref 0.1–0.9)
MONO%: 8.7 % (ref 0.0–14.0)
NEUT#: 4.2 10e3/uL (ref 1.5–6.5)
NEUT%: 74.7 % (ref 39.0–75.0)
Platelets: 117 10e3/uL — ABNORMAL LOW (ref 140–400)
RBC: 3.19 10e6/uL — ABNORMAL LOW (ref 4.20–5.82)
RDW: 14.9 % — ABNORMAL HIGH (ref 11.0–14.6)
WBC: 5.6 10e3/uL (ref 4.0–10.3)
lymph#: 0.8 10e3/uL — ABNORMAL LOW (ref 0.9–3.3)

## 2016-02-28 NOTE — Progress Notes (Signed)
[facility] [phone]   [phone]  OFFICE PROGRESS NOTE  [name], MD [place] [address]  DIAGNOSIS: Stage IIIB (T1b, N3, M0) non-small cell lung cancer favoring adenocarcinoma presented with left lower lobe pulmonary nodule in addition to left hilar and bilateral mediastinal lymphadenopathy in addition to right supraclavicular lymph nodes diagnosed in April 2017.  PRIOR THERAPY: Concurrent chemoradiation with weekly carboplatin for AUC of 2 and paclitaxel 45 mg/M2 started on 11/15/2015. He is status post 6 cycles. Last dose was getting 12/20/2015 with partial response.   CURRENT THERAPY: Consolidation chemotherapy with carboplatin for AUC of 5 and paclitaxel 175 MG/M2 every 3 weeks with Neulasta support. Status post one cycle.  INTERVAL HISTORY: Brian White 65 y.o. male returns to the clinic today for follow-up visit accompanied by his wife. The patient is feeling fine today with no specific complaints except for development of shingles on the left side of his chest. He was seen by his primary care physician and started on Valtrex few days ago. He is still under treatment. He also had a fall last week when he was at the [facility]. His blood pressure was low at that time. The patient denied having any significant chest pain, shortness of breath, cough or hemoptysis. He has no nausea or vomiting, no fever or chills. He was supposed to start cycle #2 today.  MEDICAL HISTORY: Past Medical History:  Diagnosis Date  . Adenocarcinoma, lung ([place]) 10/08/2015   2.4cm spiculated LLL nodule suspicious for primary bronchogenic carcinoma by CT 09/2015 - adenocarcinoma by endobronchial biopsy  . Allergy   . Arthritis   . CAD (coronary artery disease)    mild MI; pt unaware - seen on stress test  . COPD (chronic obstructive pulmonary disease) ([place]) 06/26/2011   Mild centrilobular and paraseptal emphysema by CT 09/2015   . ED  (erectile dysfunction)   . H/O anaphylactic shock 2013   hives,hypotension,syncope  . H/O seasonal allergies   . Herpes zoster 12/06/2015  . Hypertension   . Polycythemia 01/25/2014  . Prostate cancer [facility]) 2013   s/p radiation therapy March 2013, planned f/u with uro after XRT  . Radiation 09/18/2011 through 11/10/2011   Prostate 7600 cGy 40 sessions, seminal vesicles 5600 cGy 40 sessions    . Radiation 11/10/15-12/22/15   chest 60 Gy  . Shortness of breath dyspnea   . Smoker   . Thoracic aorta atherosclerosis ([place]) 10/08/2015   Mild by CT 09/2015     ALLERGIES:  is allergic to onion.  MEDICATIONS:  Current Outpatient Prescriptions  Medication Sig Dispense Refill  . aspirin 81 MG tablet Take 81 mg by mouth daily. Reported on 12/21/2015    . budesonide-formoterol (SYMBICORT) 160-4.5 MCG/ACT inhaler Inhale 1 puff into the lungs 2 (two) times daily. 1 Inhaler 3  . cetirizine (ZYRTEC) 10 MG chewable tablet Chew 10 mg by mouth daily. Reported on 12/20/2015    . EPINEPHrine (EPIPEN) 0.3 mg/0.3 mL DEVI Inject 0.3 mLs (0.3 mg total) into the muscle once. 1 Device prn  . ibuprofen (ADVIL,MOTRIN) 200 MG tablet Take 600 mg by mouth every 6 (six) hours as needed (Pain). Reported on 12/21/2015    . losartan-hydrochlorothiazide (HYZAAR) 100-25 MG tablet Take 1 tablet by mouth daily. 90 tablet 3  . oxyCODONE-acetaminophen (PERCOCET/ROXICET) 5-325 MG tablet Take 1 tablet by mouth every 4 (four) hours as needed for severe pain. 30 tablet 0  . prochlorperazine (COMPAZINE) 10 MG  tablet Take 1 tablet (10 mg total) by mouth every 6 (six) hours as needed for nausea or vomiting. 30 tablet 0  . sildenafil (VIAGRA) 100 MG tablet Take 0.5-1 tablets (50-100 mg total) by mouth daily as needed for erectile dysfunction. 4 tablet 0  . sucralfate (CARAFATE) 1 GM/10ML suspension Take 10 mLs (1 g total) by mouth 4 (four) times daily -  with meals and at bedtime. 420 mL 0  . valACYclovir (VALTREX) 1000 MG  tablet Take 1 tablet (1,000 mg total) by mouth 3 (three) times daily. 30 tablet 0  . Wound Dressings (SONAFINE) Apply 1 application topically 2 (two) times daily.     No current facility-administered medications for this visit.     SURGICAL HISTORY:  Past Surgical History:  Procedure Laterality Date  . COLONOSCOPY W/ BIOPSIES AND POLYPECTOMY  10/11/15   mult TAs, 3cm rectal polypoid lesion; rec further treatment, diverticulosis ([facility])  . ENDOBRONCHIAL ULTRASOUND Bilateral 10/18/2015   Procedure: ENDOBRONCHIAL ULTRASOUND;  Surgeon: Javier Glazier, MD;  Location: [facility];  Service: Cardiopulmonary;  Laterality: Bilateral;  . HERNIA REPAIR Right 2005   with mesh  . INSERTION PROSTATE RADIATION SEED  08/2011   prostate cancer, [facility]    REVIEW OF SYSTEMS:  A comprehensive review of systems was negative except for: Constitutional: positive for fatigue Integument/breast: positive for rash   PHYSICAL EXAMINATION: General appearance: alert, cooperative and no distress Head: Normocephalic, without obvious abnormality, atraumatic Neck: no adenopathy, no JVD, supple, symmetrical, trachea midline and thyroid not enlarged, symmetric, no tenderness/mass/nodules Lymph nodes: Cervical, supraclavicular, and axillary nodes normal. Resp: clear to auscultation bilaterally Back: symmetric, no curvature. ROM normal. No CVA tenderness. Cardio: regular rate and rhythm, S1, S2 normal, no murmur, click, rub or gallop GI: soft, non-tender; bowel sounds normal; no masses,  no organomegaly Extremities: extremities normal, atraumatic, no cyanosis or edema Neurologic: Alert and oriented X 3, normal strength and tone. Normal symmetric reflexes. Normal coordination and gait    ECOG PERFORMANCE STATUS: 1 - Symptomatic but completely ambulatory  Blood pressure 122/72, pulse 64, temperature 98.7 F (37.1 C), temperature source Oral, resp. rate 18, height '5\' 11"'$  (1.803 m), weight 202 lb 4.8 oz (91.8  kg), SpO2 100 %.  LABORATORY DATA: Lab Results  Component Value Date   WBC 5.6 02/28/2016   HGB 11.1 (L) 02/28/2016   HCT 33.2 (L) 02/28/2016   MCV 104.2 (H) 02/28/2016   PLT 117 (L) 02/28/2016      Chemistry      Component Value Date/Time   NA 137 02/21/2016 0826   K 4.2 02/21/2016 0826   CL 99 08/27/2015 0946   CO2 24 02/21/2016 0826   BUN 8.7 02/21/2016 0826   CREATININE 0.9 02/21/2016 0826      Component Value Date/Time   CALCIUM 9.6 02/21/2016 0826   ALKPHOS 111 02/21/2016 0826   AST 18 02/21/2016 0826   ALT 14 02/21/2016 0826   BILITOT 0.98 02/21/2016 0826       RADIOGRAPHIC STUDIES: No results found.  ASSESSMENT AND PLAN: This is a very pleasant 66 years old white male with stage IIIB non-small cell lung cancer status post course of concurrent chemoradiation with weekly carboplatin and paclitaxel for 6 cycles.  He tolerated his treatment fairly well except for mild odynophagia and dysphagia. He is currently undergoing consolidation chemotherapy with carboplatin and paclitaxel is status post 1 cycle. He tolerated the first cycle well except for the development of shingles on the left side of the  chest. He is currently under treatment for the shingles with Valtrex. I recommended for the patient to delay the start of cycle #2 by 1 week to give him more time to recover from the recent shingles infection He would come back for follow-up visit in 4 weeks for evaluation before starting cycle #3 He was advised to call immediately if he has any concerning symptoms in the interval. The patient voices understanding of current disease status and treatment options and is in agreement with the current care plan.  All questions were answered. The patient knows to call the clinic with any problems, questions or concerns. We can certainly see the patient much sooner if necessary.  Disclaimer: This note was dictated with voice recognition software. Similar sounding words can  inadvertently be transcribed and may not be corrected upon review.

## 2016-02-28 NOTE — Telephone Encounter (Signed)
Gave patient next appointment for 8/21 and patient to get new schedule when he comes in.

## 2016-03-01 ENCOUNTER — Ambulatory Visit: Payer: BLUE CROSS/BLUE SHIELD

## 2016-03-01 NOTE — Progress Notes (Signed)
Pt did not receive treatment for Cycle 2, Neulasta Injection was not given

## 2016-03-06 ENCOUNTER — Ambulatory Visit (HOSPITAL_BASED_OUTPATIENT_CLINIC_OR_DEPARTMENT_OTHER): Payer: BLUE CROSS/BLUE SHIELD

## 2016-03-06 ENCOUNTER — Other Ambulatory Visit (HOSPITAL_BASED_OUTPATIENT_CLINIC_OR_DEPARTMENT_OTHER): Payer: BLUE CROSS/BLUE SHIELD

## 2016-03-06 DIAGNOSIS — C3432 Malignant neoplasm of lower lobe, left bronchus or lung: Secondary | ICD-10-CM

## 2016-03-06 DIAGNOSIS — Z5111 Encounter for antineoplastic chemotherapy: Secondary | ICD-10-CM

## 2016-03-06 LAB — CBC WITH DIFFERENTIAL/PLATELET
BASO%: 0.4 % (ref 0.0–2.0)
Basophils Absolute: 0 10*3/uL (ref 0.0–0.1)
EOS%: 1.5 % (ref 0.0–7.0)
Eosinophils Absolute: 0.1 10*3/uL (ref 0.0–0.5)
HCT: 33.5 % — ABNORMAL LOW (ref 38.4–49.9)
HGB: 11.6 g/dL — ABNORMAL LOW (ref 13.0–17.1)
LYMPH%: 24.5 % (ref 14.0–49.0)
MCH: 35.5 pg — ABNORMAL HIGH (ref 27.2–33.4)
MCHC: 34.6 g/dL (ref 32.0–36.0)
MCV: 102.4 fL — ABNORMAL HIGH (ref 79.3–98.0)
MONO#: 0.5 10*3/uL (ref 0.1–0.9)
MONO%: 9.8 % (ref 0.0–14.0)
NEUT#: 3 10*3/uL (ref 1.5–6.5)
NEUT%: 63.8 % (ref 39.0–75.0)
Platelets: 199 10*3/uL (ref 140–400)
RBC: 3.27 10*6/uL — ABNORMAL LOW (ref 4.20–5.82)
RDW: 15.6 % — ABNORMAL HIGH (ref 11.0–14.6)
WBC: 4.7 10*3/uL (ref 4.0–10.3)
lymph#: 1.2 10*3/uL (ref 0.9–3.3)

## 2016-03-06 LAB — COMPREHENSIVE METABOLIC PANEL
ALT: 11 U/L (ref 0–55)
AST: 17 U/L (ref 5–34)
Albumin: 3.6 g/dL (ref 3.5–5.0)
Alkaline Phosphatase: 91 U/L (ref 40–150)
Anion Gap: 9 mEq/L (ref 3–11)
BUN: 17.8 mg/dL (ref 7.0–26.0)
CO2: 24 mEq/L (ref 22–29)
Calcium: 10 mg/dL (ref 8.4–10.4)
Chloride: 107 mEq/L (ref 98–109)
Creatinine: 0.8 mg/dL (ref 0.7–1.3)
EGFR: >90 mL/min/{1.73_m2} (ref >90)
Glucose: 97 mg/dl (ref 70–140)
Potassium: 3.8 mEq/L (ref 3.5–5.1)
Sodium: 140 mEq/L (ref 136–145)
Total Bilirubin: 0.4 mg/dL (ref 0.20–1.20)
Total Protein: 7.5 g/dL (ref 6.4–8.3)

## 2016-03-06 MED ORDER — FAMOTIDINE IN NACL 20-0.9 MG/50ML-% IV SOLN
20.0000 mg | Freq: Once | INTRAVENOUS | Status: AC
Start: 2016-03-06 — End: 2016-03-06
  Administered 2016-03-06: 20 mg via INTRAVENOUS

## 2016-03-06 MED ORDER — PALONOSETRON HCL INJECTION 0.25 MG/5ML
INTRAVENOUS | Status: AC
  Filled 2016-03-06: qty 5

## 2016-03-06 MED ORDER — HEPARIN SOD (PORK) LOCK FLUSH 100 UNIT/ML IV SOLN
500.0000 [IU] | Freq: Once | INTRAVENOUS | Status: DC | PRN
  Filled 2016-03-06: qty 5

## 2016-03-06 MED ORDER — FAMOTIDINE IN NACL 20-0.9 MG/50ML-% IV SOLN
INTRAVENOUS | Status: AC
  Filled 2016-03-06: qty 50

## 2016-03-06 MED ORDER — DIPHENHYDRAMINE HCL 50 MG/ML IJ SOLN
INTRAMUSCULAR | Status: AC
  Filled 2016-03-06: qty 1

## 2016-03-06 MED ORDER — SODIUM CHLORIDE 0.9 % IV SOLN
Freq: Once | INTRAVENOUS | Status: AC
  Administered 2016-03-06: 13:00:00 via INTRAVENOUS

## 2016-03-06 MED ORDER — SODIUM CHLORIDE 0.9 % IV SOLN
675.5000 mg | Freq: Once | INTRAVENOUS | Status: AC
  Administered 2016-03-06: 680 mg via INTRAVENOUS
  Filled 2016-03-06: qty 68

## 2016-03-06 MED ORDER — DIPHENHYDRAMINE HCL 50 MG/ML IJ SOLN
50.0000 mg | Freq: Once | INTRAMUSCULAR | Status: AC
Start: 2016-03-06 — End: 2016-03-06
  Administered 2016-03-06: 50 mg via INTRAVENOUS

## 2016-03-06 MED ORDER — SODIUM CHLORIDE 0.9% FLUSH
10.0000 mL | INTRAVENOUS | Status: DC | PRN
  Filled 2016-03-06: qty 10

## 2016-03-06 MED ORDER — PACLITAXEL CHEMO INJECTION 300 MG/50ML
175.0000 mg/m2 | Freq: Once | INTRAVENOUS | Status: AC
  Administered 2016-03-06: 378 mg via INTRAVENOUS
  Filled 2016-03-06: qty 63

## 2016-03-06 MED ORDER — PALONOSETRON HCL INJECTION 0.25 MG/5ML
0.2500 mg | Freq: Once | INTRAVENOUS | Status: AC
  Administered 2016-03-06: 0.25 mg via INTRAVENOUS

## 2016-03-06 MED ORDER — SODIUM CHLORIDE 0.9 % IV SOLN
20.0000 mg | Freq: Once | INTRAVENOUS | Status: AC
  Administered 2016-03-06: 20 mg via INTRAVENOUS
  Filled 2016-03-06: qty 2

## 2016-03-06 NOTE — Patient Instructions (Signed)
[facility] Discharge Instructions for Patients Receiving Chemotherapy  Today you received the following chemotherapy agents Taxol and Carboplatin.  To help prevent nausea and vomiting after your treatment, we encourage you to take your nausea medication as prescribed.   If you develop nausea and vomiting that is not controlled by your nausea medication, call the clinic.   BELOW ARE SYMPTOMS THAT SHOULD BE REPORTED IMMEDIATELY:  *FEVER GREATER THAN 100.5 F  *CHILLS WITH OR WITHOUT FEVER  NAUSEA AND VOMITING THAT IS NOT CONTROLLED WITH YOUR NAUSEA MEDICATION  *UNUSUAL SHORTNESS OF BREATH  *UNUSUAL BRUISING OR BLEEDING  TENDERNESS IN MOUTH AND THROAT WITH OR WITHOUT PRESENCE OF ULCERS  *URINARY PROBLEMS  *BOWEL PROBLEMS  UNUSUAL RASH Items with * indicate a potential emergency and should be followed up as soon as possible.  Feel free to call the clinic you have any questions or concerns. The clinic phone number is [phone].  Please show the CHEMO ALERT CARD at check-in to the Emergency Department and triage nurse.   

## 2016-03-08 ENCOUNTER — Ambulatory Visit (HOSPITAL_BASED_OUTPATIENT_CLINIC_OR_DEPARTMENT_OTHER): Payer: BLUE CROSS/BLUE SHIELD

## 2016-03-08 VITALS — BP 117/65 | HR 60 | Temp 98.2°F | Resp 18

## 2016-03-08 DIAGNOSIS — Z5189 Encounter for other specified aftercare: Secondary | ICD-10-CM

## 2016-03-08 DIAGNOSIS — C3432 Malignant neoplasm of lower lobe, left bronchus or lung: Secondary | ICD-10-CM | POA: Diagnosis not present

## 2016-03-08 MED ORDER — PEGFILGRASTIM INJECTION 6 MG/0.6ML ~~LOC~~
6.0000 mg | PREFILLED_SYRINGE | Freq: Once | SUBCUTANEOUS | Status: AC
  Administered 2016-03-08: 6 mg via SUBCUTANEOUS
  Filled 2016-03-08: qty 0.6

## 2016-03-08 NOTE — Patient Instructions (Signed)
Pegfilgrastim injection What is this medicine? PEGFILGRASTIM (PEG fil gra stim) is a long-acting granulocyte colony-stimulating factor that stimulates the growth of neutrophils, a type of white blood cell important in the body's fight against infection. It is used to reduce the incidence of fever and infection in patients with certain types of cancer who are receiving chemotherapy that affects the bone marrow, and to increase survival after being exposed to high doses of radiation. This medicine may be used for other purposes; ask your health care provider or pharmacist if you have questions. What should I tell my health care provider before I take this medicine? They need to know if you have any of these conditions: -kidney disease -latex allergy -ongoing radiation therapy -sickle cell disease -skin reactions to acrylic adhesives (On-Body Injector only) -an unusual or allergic reaction to pegfilgrastim, filgrastim, other medicines, foods, dyes, or preservatives -pregnant or trying to get pregnant -breast-feeding How should I use this medicine? This medicine is for injection under the skin. If you get this medicine at home, you will be taught how to prepare and give the pre-filled syringe or how to use the On-body Injector. Refer to the patient Instructions for Use for detailed instructions. Use exactly as directed. Take your medicine at regular intervals. Do not take your medicine more often than directed. It is important that you put your used needles and syringes in a special sharps container. Do not put them in a trash can. If you do not have a sharps container, call your pharmacist or healthcare provider to get one. Talk to your pediatrician regarding the use of this medicine in children. While this drug may be prescribed for selected conditions, precautions do apply. Overdosage: If you think you have taken too much of this medicine contact a poison control center or emergency room at  once. NOTE: This medicine is only for you. Do not share this medicine with others. What if I miss a dose? It is important not to miss your dose. Call your doctor or health care professional if you miss your dose. If you miss a dose due to an On-body Injector failure or leakage, a new dose should be administered as soon as possible using a single prefilled syringe for manual use. What may interact with this medicine? Interactions have not been studied. Give your health care provider a list of all the medicines, herbs, non-prescription drugs, or dietary supplements you use. Also tell them if you smoke, drink alcohol, or use illegal drugs. Some items may interact with your medicine. This list may not describe all possible interactions. Give your health care provider a list of all the medicines, herbs, non-prescription drugs, or dietary supplements you use. Also tell them if you smoke, drink alcohol, or use illegal drugs. Some items may interact with your medicine. What should I watch for while using this medicine? You may need blood work done while you are taking this medicine. If you are going to need a MRI, CT scan, or other procedure, tell your doctor that you are using this medicine (On-Body Injector only). What side effects may I notice from receiving this medicine? Side effects that you should report to your doctor or health care professional as soon as possible: -allergic reactions like skin rash, itching or hives, swelling of the face, lips, or tongue -dizziness -fever -pain, redness, or irritation at site where injected -pinpoint red spots on the skin -red or dark-brown urine -shortness of breath or breathing problems -stomach or side pain, or pain   at the shoulder -swelling -tiredness -trouble passing urine or change in the amount of urine Side effects that usually do not require medical attention (report to your doctor or health care professional if they continue or are  bothersome): -bone pain -muscle pain This list may not describe all possible side effects. Call your doctor for medical advice about side effects. You may report side effects to FDA at 1-800-FDA-1088. Where should I keep my medicine? Keep out of the reach of children. Store pre-filled syringes in a refrigerator between 2 and 8 degrees C (36 and 46 degrees F). Do not freeze. Keep in carton to protect from light. Throw away this medicine if it is left out of the refrigerator for more than 48 hours. Throw away any unused medicine after the expiration date. NOTE: This sheet is a summary. It may not cover all possible information. If you have questions about this medicine, talk to your doctor, pharmacist, or health care provider.    2016, [employer]vier/Gold Standard. (2014-07-23 14:30:14)  

## 2016-03-13 ENCOUNTER — Other Ambulatory Visit (HOSPITAL_BASED_OUTPATIENT_CLINIC_OR_DEPARTMENT_OTHER): Payer: BLUE CROSS/BLUE SHIELD

## 2016-03-13 DIAGNOSIS — C3432 Malignant neoplasm of lower lobe, left bronchus or lung: Secondary | ICD-10-CM

## 2016-03-13 LAB — CBC WITH DIFFERENTIAL/PLATELET
BASO%: 0.3 % (ref 0.0–2.0)
Basophils Absolute: 0 10*3/uL (ref 0.0–0.1)
EOS%: 1.6 % (ref 0.0–7.0)
Eosinophils Absolute: 0.1 10*3/uL (ref 0.0–0.5)
HCT: 29.6 % — ABNORMAL LOW (ref 38.4–49.9)
HGB: 10.1 g/dL — ABNORMAL LOW (ref 13.0–17.1)
LYMPH%: 21.3 % (ref 14.0–49.0)
MCH: 35.7 pg — ABNORMAL HIGH (ref 27.2–33.4)
MCHC: 34.1 g/dL (ref 32.0–36.0)
MCV: 104.6 fL — ABNORMAL HIGH (ref 79.3–98.0)
MONO#: 0.4 10*3/uL (ref 0.1–0.9)
MONO%: 11.1 % (ref 0.0–14.0)
NEUT#: 2.4 10*3/uL (ref 1.5–6.5)
NEUT%: 65.7 % (ref 39.0–75.0)
Platelets: 70 10*3/uL — ABNORMAL LOW (ref 140–400)
RBC: 2.83 10*6/uL — ABNORMAL LOW (ref 4.20–5.82)
RDW: 15.5 % — ABNORMAL HIGH (ref 11.0–14.6)
WBC: 3.7 10*3/uL — ABNORMAL LOW (ref 4.0–10.3)
lymph#: 0.8 10*3/uL — ABNORMAL LOW (ref 0.9–3.3)

## 2016-03-13 LAB — COMPREHENSIVE METABOLIC PANEL
ALT: 13 U/L (ref 0–55)
AST: 18 U/L (ref 5–34)
Albumin: 3.6 g/dL (ref 3.5–5.0)
Alkaline Phosphatase: 80 U/L (ref 40–150)
Anion Gap: 9 mEq/L (ref 3–11)
BUN: 15.3 mg/dL (ref 7.0–26.0)
CO2: 26 mEq/L (ref 22–29)
Calcium: 9.7 mg/dL (ref 8.4–10.4)
Chloride: 107 mEq/L (ref 98–109)
Creatinine: 0.8 mg/dL (ref 0.7–1.3)
EGFR: >90 mL/min/{1.73_m2} (ref >90)
Glucose: 92 mg/dl (ref 70–140)
Potassium: 4 mEq/L (ref 3.5–5.1)
Sodium: 143 mEq/L (ref 136–145)
Total Bilirubin: 0.51 mg/dL (ref 0.20–1.20)
Total Protein: 6.8 g/dL (ref 6.4–8.3)

## 2016-03-14 ENCOUNTER — Encounter: Payer: Self-pay | Admitting: Internal Medicine

## 2016-03-15 ENCOUNTER — Encounter: Payer: Self-pay | Admitting: Medical Oncology

## 2016-03-15 NOTE — Telephone Encounter (Signed)
Emailed response back to pt.

## 2016-03-15 NOTE — Telephone Encounter (Signed)
-----   Message from [name], MD sent at 03/15/2016  9:17 AM EDT ----- Regarding: RE: tx Only 3 cycles. Please ignor the scheduled cycle #4. ----- Message ----- From: Ardeen Garland, RN Sent: 03/15/2016   9:14 AM To: [name], MD Subject: tx                                             "I see they have me scheduled for 4 Chemo treatments when you had told us i will have 3. Has there been another one added that i am unaware of? Thinking maybe this one may have been added by mistake when we put that one off for a week when i  had shingles.  Please let me know what the answer is.  "

## 2016-03-21 ENCOUNTER — Other Ambulatory Visit (HOSPITAL_BASED_OUTPATIENT_CLINIC_OR_DEPARTMENT_OTHER): Payer: BLUE CROSS/BLUE SHIELD

## 2016-03-21 DIAGNOSIS — C3432 Malignant neoplasm of lower lobe, left bronchus or lung: Secondary | ICD-10-CM | POA: Diagnosis not present

## 2016-03-21 LAB — CBC WITH DIFFERENTIAL/PLATELET
BASO%: 0.3 % (ref 0.0–2.0)
Basophils Absolute: 0 10*3/uL (ref 0.0–0.1)
EOS%: 0.2 % (ref 0.0–7.0)
Eosinophils Absolute: 0 10*3/uL (ref 0.0–0.5)
HCT: 30.1 % — ABNORMAL LOW (ref 38.4–49.9)
HGB: 10 g/dL — ABNORMAL LOW (ref 13.0–17.1)
LYMPH%: 8.3 % — ABNORMAL LOW (ref 14.0–49.0)
MCH: 36 pg — ABNORMAL HIGH (ref 27.2–33.4)
MCHC: 33.3 g/dL (ref 32.0–36.0)
MCV: 108.1 fL — ABNORMAL HIGH (ref 79.3–98.0)
MONO#: 0.6 10*3/uL (ref 0.1–0.9)
MONO%: 6.2 % (ref 0.0–14.0)
NEUT#: 7.6 10*3/uL — ABNORMAL HIGH (ref 1.5–6.5)
NEUT%: 85 % — ABNORMAL HIGH (ref 39.0–75.0)
Platelets: 70 10*3/uL — ABNORMAL LOW (ref 140–400)
RBC: 2.78 10*6/uL — ABNORMAL LOW (ref 4.20–5.82)
RDW: 17.4 % — ABNORMAL HIGH (ref 11.0–14.6)
WBC: 8.9 10*3/uL (ref 4.0–10.3)
lymph#: 0.7 10*3/uL — ABNORMAL LOW (ref 0.9–3.3)

## 2016-03-21 LAB — COMPREHENSIVE METABOLIC PANEL
ALT: 12 U/L (ref 0–55)
AST: 18 U/L (ref 5–34)
Albumin: 3.4 g/dL — ABNORMAL LOW (ref 3.5–5.0)
Alkaline Phosphatase: 112 U/L (ref 40–150)
Anion Gap: 8 mEq/L (ref 3–11)
BUN: 9.7 mg/dL (ref 7.0–26.0)
CO2: 25 mEq/L (ref 22–29)
Calcium: 9.3 mg/dL (ref 8.4–10.4)
Chloride: 106 mEq/L (ref 98–109)
Creatinine: 0.7 mg/dL (ref 0.7–1.3)
EGFR: >90 mL/min/{1.73_m2} (ref >90)
Glucose: 96 mg/dl (ref 70–140)
Potassium: 4.3 mEq/L (ref 3.5–5.1)
Sodium: 140 mEq/L (ref 136–145)
Total Bilirubin: 0.57 mg/dL (ref 0.20–1.20)
Total Protein: 6.7 g/dL (ref 6.4–8.3)

## 2016-03-22 ENCOUNTER — Ambulatory Visit: Payer: BLUE CROSS/BLUE SHIELD | Admitting: Gastroenterology

## 2016-03-27 ENCOUNTER — Ambulatory Visit (HOSPITAL_BASED_OUTPATIENT_CLINIC_OR_DEPARTMENT_OTHER): Payer: BLUE CROSS/BLUE SHIELD | Admitting: Internal Medicine

## 2016-03-27 ENCOUNTER — Telehealth: Payer: Self-pay | Admitting: *Deleted

## 2016-03-27 ENCOUNTER — Telehealth: Payer: Self-pay | Admitting: Internal Medicine

## 2016-03-27 ENCOUNTER — Other Ambulatory Visit (HOSPITAL_BASED_OUTPATIENT_CLINIC_OR_DEPARTMENT_OTHER): Payer: BLUE CROSS/BLUE SHIELD

## 2016-03-27 ENCOUNTER — Ambulatory Visit: Payer: BLUE CROSS/BLUE SHIELD

## 2016-03-27 ENCOUNTER — Encounter: Payer: Self-pay | Admitting: Internal Medicine

## 2016-03-27 VITALS — BP 148/70 | HR 64 | Temp 98.6°F | Resp 18 | Ht 71.0 in | Wt 203.0 lb

## 2016-03-27 DIAGNOSIS — C3432 Malignant neoplasm of lower lobe, left bronchus or lung: Secondary | ICD-10-CM | POA: Diagnosis not present

## 2016-03-27 DIAGNOSIS — Z5111 Encounter for antineoplastic chemotherapy: Secondary | ICD-10-CM

## 2016-03-27 LAB — CBC WITH DIFFERENTIAL/PLATELET
BASO%: 0.5 % (ref 0.0–2.0)
Basophils Absolute: 0 10*3/uL (ref 0.0–0.1)
EOS%: 0.3 % (ref 0.0–7.0)
Eosinophils Absolute: 0 10*3/uL (ref 0.0–0.5)
HCT: 35.3 % — ABNORMAL LOW (ref 38.4–49.9)
HGB: 11.7 g/dL — ABNORMAL LOW (ref 13.0–17.1)
LYMPH%: 14.7 % (ref 14.0–49.0)
MCH: 35.6 pg — ABNORMAL HIGH (ref 27.2–33.4)
MCHC: 33.2 g/dL (ref 32.0–36.0)
MCV: 107.4 fL — ABNORMAL HIGH (ref 79.3–98.0)
MONO#: 0.5 10*3/uL (ref 0.1–0.9)
MONO%: 8.4 % (ref 0.0–14.0)
NEUT#: 4.4 10*3/uL (ref 1.5–6.5)
NEUT%: 76.1 % — ABNORMAL HIGH (ref 39.0–75.0)
Platelets: 85 10*3/uL — ABNORMAL LOW (ref 140–400)
RBC: 3.29 10*6/uL — ABNORMAL LOW (ref 4.20–5.82)
RDW: 17.5 % — ABNORMAL HIGH (ref 11.0–14.6)
WBC: 5.8 10*3/uL (ref 4.0–10.3)
lymph#: 0.8 10*3/uL — ABNORMAL LOW (ref 0.9–3.3)

## 2016-03-27 LAB — COMPREHENSIVE METABOLIC PANEL
ALT: 14 U/L (ref 0–55)
AST: 17 U/L (ref 5–34)
Albumin: 3.6 g/dL (ref 3.5–5.0)
Alkaline Phosphatase: 107 U/L (ref 40–150)
Anion Gap: 10 mEq/L (ref 3–11)
BUN: 10.2 mg/dL (ref 7.0–26.0)
CO2: 25 mEq/L (ref 22–29)
Calcium: 10 mg/dL (ref 8.4–10.4)
Chloride: 104 mEq/L (ref 98–109)
Creatinine: 0.8 mg/dL (ref 0.7–1.3)
EGFR: >90 mL/min/{1.73_m2} (ref >90)
Glucose: 86 mg/dl (ref 70–140)
Potassium: 3.9 mEq/L (ref 3.5–5.1)
Sodium: 140 mEq/L (ref 136–145)
Total Bilirubin: 0.37 mg/dL (ref 0.20–1.20)
Total Protein: 7.5 g/dL (ref 6.4–8.3)

## 2016-03-27 NOTE — Telephone Encounter (Signed)
Per LOS I have scheduled appts. Advised scheduler appt in and first available given

## 2016-03-27 NOTE — Telephone Encounter (Signed)
Message sent to chemo scheduler to add chemo. Avs report and appt schd given per 03/27/16 los.

## 2016-03-27 NOTE — Progress Notes (Signed)
[facility] [phone]   [phone]  OFFICE PROGRESS NOTE  [name], MD [place] [address]  DIAGNOSIS: Stage IIIB (T1b, N3, M0) non-small cell lung cancer favoring adenocarcinoma presented with left lower lobe pulmonary nodule in addition to left hilar and bilateral mediastinal lymphadenopathy in addition to right supraclavicular lymph nodes diagnosed in April 2017.  PRIOR THERAPY: Concurrent chemoradiation with weekly carboplatin for AUC of 2 and paclitaxel 45 mg/M2 started on 11/15/2015. He is status post 6 cycles. Last dose was getting 12/20/2015 with partial response.   CURRENT THERAPY: Consolidation chemotherapy with carboplatin for AUC of 5 and paclitaxel 175 MG/M2 every 3 weeks with Neulasta support. Status post 2 cycles.  INTERVAL HISTORY: Brian White 65 y.o. male returns to the clinic today for follow-up visit accompanied by his wife. The patient is feeling fine today with no specific complaints. He is currently on consolidation chemotherapy with carboplatin and paclitaxel and tolerating his treatment well. The patient denied having any significant chest pain, shortness of breath, cough or hemoptysis. He has no nausea or vomiting, no fever or chills. He is here today for evaluation before starting cycle #3.  MEDICAL HISTORY: Past Medical History:  Diagnosis Date  . Adenocarcinoma, lung ([place]) 10/08/2015   2.4cm spiculated LLL nodule suspicious for primary bronchogenic carcinoma by CT 09/2015 - adenocarcinoma by endobronchial biopsy  . Allergy   . Arthritis   . CAD (coronary artery disease)    mild MI; pt unaware - seen on stress test  . COPD (chronic obstructive pulmonary disease) ([place]) 06/26/2011   Mild centrilobular and paraseptal emphysema by CT 09/2015   . ED (erectile dysfunction)   . H/O anaphylactic shock 2013   hives,hypotension,syncope  . H/O seasonal allergies   . Herpes zoster 12/06/2015  .  Hypertension   . Polycythemia 01/25/2014  . Prostate cancer [facility]) 2013   s/p radiation therapy March 2013, planned f/u with uro after XRT  . Radiation 09/18/2011 through 11/10/2011   Prostate 7600 cGy 40 sessions, seminal vesicles 5600 cGy 40 sessions    . Radiation 11/10/15-12/22/15   chest 60 Gy  . Shortness of breath dyspnea   . Smoker   . Thoracic aorta atherosclerosis ([place]) 10/08/2015   Mild by CT 09/2015     ALLERGIES:  is allergic to onion.  MEDICATIONS:  Current Outpatient Prescriptions  Medication Sig Dispense Refill  . aspirin 81 MG tablet Take 81 mg by mouth daily. Reported on 12/21/2015    . budesonide-formoterol (SYMBICORT) 160-4.5 MCG/ACT inhaler Inhale 1 puff into the lungs 2 (two) times daily. 1 Inhaler 3  . cetirizine (ZYRTEC) 10 MG chewable tablet Chew 10 mg by mouth daily. Reported on 12/20/2015    . EPINEPHrine (EPIPEN) 0.3 mg/0.3 mL DEVI Inject 0.3 mLs (0.3 mg total) into the muscle once. 1 Device prn  . ibuprofen (ADVIL,MOTRIN) 200 MG tablet Take 600 mg by mouth every 6 (six) hours as needed (Pain). Reported on 12/21/2015    . losartan-hydrochlorothiazide (HYZAAR) 100-25 MG tablet Take 1 tablet by mouth daily. 90 tablet 3  . oxyCODONE-acetaminophen (PERCOCET/ROXICET) 5-325 MG tablet Take 1 tablet by mouth every 4 (four) hours as needed for severe pain. 30 tablet 0  . prochlorperazine (COMPAZINE) 10 MG tablet Take 1 tablet (10 mg total) by mouth every 6 (six) hours as needed for nausea or vomiting. 30 tablet 0  . sildenafil (VIAGRA) 100 MG tablet Take 0.5-1 tablets (50-100 mg total) by  mouth daily as needed for erectile dysfunction. 4 tablet 0  . sucralfate (CARAFATE) 1 GM/10ML suspension Take 10 mLs (1 g total) by mouth 4 (four) times daily -  with meals and at bedtime. 420 mL 0  . valACYclovir (VALTREX) 1000 MG tablet Take 1 tablet (1,000 mg total) by mouth 3 (three) times daily. 30 tablet 0  . Wound Dressings (SONAFINE) Apply 1 application topically 2  (two) times daily.     No current facility-administered medications for this visit.     SURGICAL HISTORY:  Past Surgical History:  Procedure Laterality Date  . COLONOSCOPY W/ BIOPSIES AND POLYPECTOMY  10/11/15   mult TAs, 3cm rectal polypoid lesion; rec further treatment, diverticulosis ([facility])  . ENDOBRONCHIAL ULTRASOUND Bilateral 10/18/2015   Procedure: ENDOBRONCHIAL ULTRASOUND;  Surgeon: Javier Glazier, MD;  Location: [facility];  Service: Cardiopulmonary;  Laterality: Bilateral;  . HERNIA REPAIR Right 2005   with mesh  . INSERTION PROSTATE RADIATION SEED  08/2011   prostate cancer, [place]    REVIEW OF SYSTEMS:  A comprehensive review of systems was negative except for: Constitutional: positive for fatigue   PHYSICAL EXAMINATION: General appearance: alert, cooperative and no distress Head: Normocephalic, without obvious abnormality, atraumatic Neck: no adenopathy, no JVD, supple, symmetrical, trachea midline and thyroid not enlarged, symmetric, no tenderness/mass/nodules Lymph nodes: Cervical, supraclavicular, and axillary nodes normal. Resp: clear to auscultation bilaterally Back: symmetric, no curvature. ROM normal. No CVA tenderness. Cardio: regular rate and rhythm, S1, S2 normal, no murmur, click, rub or gallop GI: soft, non-tender; bowel sounds normal; no masses,  no organomegaly Extremities: extremities normal, atraumatic, no cyanosis or edema Neurologic: Alert and oriented X 3, normal strength and tone. Normal symmetric reflexes. Normal coordination and gait    ECOG PERFORMANCE STATUS: 1 - Symptomatic but completely ambulatory  Blood pressure (!) 148/70, pulse 64, temperature 98.6 F (37 C), temperature source Oral, resp. rate 18, height '5\' 11"'$  (1.803 m), weight 203 lb (92.1 kg), SpO2 100 %.  LABORATORY DATA: Lab Results  Component Value Date   WBC 5.8 03/27/2016   HGB 11.7 (L) 03/27/2016   HCT 35.3 (L) 03/27/2016   MCV 107.4 (H) 03/27/2016   PLT 85 (L)  03/27/2016      Chemistry      Component Value Date/Time   NA 140 03/21/2016 1225   K 4.3 03/21/2016 1225   CL 99 08/27/2015 0946   CO2 25 03/21/2016 1225   BUN 9.7 03/21/2016 1225   CREATININE 0.7 03/21/2016 1225      Component Value Date/Time   CALCIUM 9.3 03/21/2016 1225   ALKPHOS 112 03/21/2016 1225   AST 18 03/21/2016 1225   ALT 12 03/21/2016 1225   BILITOT 0.57 03/21/2016 1225       RADIOGRAPHIC STUDIES: No results found.  ASSESSMENT AND PLAN: This is a very pleasant 65 years old white male with stage IIIB non-small cell lung cancer status post course of concurrent chemoradiation with weekly carboplatin and paclitaxel for 6 cycles.  He tolerated his treatment fairly well except for mild odynophagia and dysphagia. He is currently undergoing consolidation chemotherapy with carboplatin and paclitaxel is status post 2 cycles. He tolerated the last cycle well. His platelets count are low today. I recommended for the patient to delay the start of cycle #3 by 1 week until improvement of his platelets count. He would come back for follow-up visit in 4 weeks for evaluation with repeat CT scan of the chest for restaging of his disease. He was  advised to call immediately if he has any concerning symptoms in the interval. The patient voices understanding of current disease status and treatment options and is in agreement with the current care plan.  All questions were answered. The patient knows to call the clinic with any problems, questions or concerns. We can certainly see the patient much sooner if necessary.  Disclaimer: This note was dictated with voice recognition software. Similar sounding words can inadvertently be transcribed and may not be corrected upon review.

## 2016-03-29 ENCOUNTER — Ambulatory Visit: Payer: BLUE CROSS/BLUE SHIELD

## 2016-03-29 NOTE — Progress Notes (Signed)
Pt did not have chemo on 03/27/2016, Neulasta injection not needed at this time

## 2016-04-03 ENCOUNTER — Ambulatory Visit (HOSPITAL_BASED_OUTPATIENT_CLINIC_OR_DEPARTMENT_OTHER): Payer: BLUE CROSS/BLUE SHIELD

## 2016-04-03 ENCOUNTER — Other Ambulatory Visit (HOSPITAL_BASED_OUTPATIENT_CLINIC_OR_DEPARTMENT_OTHER): Payer: BLUE CROSS/BLUE SHIELD

## 2016-04-03 VITALS — BP 136/71 | HR 55 | Temp 98.5°F | Resp 17

## 2016-04-03 DIAGNOSIS — Z5111 Encounter for antineoplastic chemotherapy: Secondary | ICD-10-CM | POA: Diagnosis not present

## 2016-04-03 DIAGNOSIS — Z23 Encounter for immunization: Secondary | ICD-10-CM

## 2016-04-03 DIAGNOSIS — C3432 Malignant neoplasm of lower lobe, left bronchus or lung: Secondary | ICD-10-CM

## 2016-04-03 LAB — CBC WITH DIFFERENTIAL/PLATELET
BASO%: 0.2 % (ref 0.0–2.0)
Basophils Absolute: 0 10*3/uL (ref 0.0–0.1)
EOS%: 1.2 % (ref 0.0–7.0)
Eosinophils Absolute: 0.1 10*3/uL (ref 0.0–0.5)
HCT: 34.3 % — ABNORMAL LOW (ref 38.4–49.9)
HGB: 11.7 g/dL — ABNORMAL LOW (ref 13.0–17.1)
LYMPH%: 19.7 % (ref 14.0–49.0)
MCH: 35.6 pg — ABNORMAL HIGH (ref 27.2–33.4)
MCHC: 34.1 g/dL (ref 32.0–36.0)
MCV: 104.3 fL — ABNORMAL HIGH (ref 79.3–98.0)
MONO#: 0.4 10*3/uL (ref 0.1–0.9)
MONO%: 9.3 % (ref 0.0–14.0)
NEUT#: 2.8 10*3/uL (ref 1.5–6.5)
NEUT%: 69.6 % (ref 39.0–75.0)
Platelets: 109 10*3/uL — ABNORMAL LOW (ref 140–400)
RBC: 3.29 10*6/uL — ABNORMAL LOW (ref 4.20–5.82)
RDW: 16 % — ABNORMAL HIGH (ref 11.0–14.6)
WBC: 4.1 10*3/uL (ref 4.0–10.3)
lymph#: 0.8 10*3/uL — ABNORMAL LOW (ref 0.9–3.3)
nRBC: 0 % (ref 0–0)

## 2016-04-03 LAB — COMPREHENSIVE METABOLIC PANEL
ALT: 12 U/L (ref 0–55)
AST: 18 U/L (ref 5–34)
Albumin: 3.7 g/dL (ref 3.5–5.0)
Alkaline Phosphatase: 87 U/L (ref 40–150)
Anion Gap: 10 mEq/L (ref 3–11)
BUN: 10.2 mg/dL (ref 7.0–26.0)
CO2: 26 mEq/L (ref 22–29)
Calcium: 9.8 mg/dL (ref 8.4–10.4)
Chloride: 107 mEq/L (ref 98–109)
Creatinine: 0.8 mg/dL (ref 0.7–1.3)
EGFR: >90 mL/min/{1.73_m2} (ref >90)
Glucose: 98 mg/dl (ref 70–140)
Potassium: 4.4 mEq/L (ref 3.5–5.1)
Sodium: 142 mEq/L (ref 136–145)
Total Bilirubin: 0.48 mg/dL (ref 0.20–1.20)
Total Protein: 7.2 g/dL (ref 6.4–8.3)

## 2016-04-03 MED ORDER — INFLUENZA VAC SPLIT QUAD 0.5 ML IM SUSY
0.5000 mL | PREFILLED_SYRINGE | Freq: Once | INTRAMUSCULAR | Status: AC
  Administered 2016-04-03: 0.5 mL via INTRAMUSCULAR
  Filled 2016-04-03: qty 0.5

## 2016-04-03 MED ORDER — PALONOSETRON HCL INJECTION 0.25 MG/5ML
INTRAVENOUS | Status: AC
  Filled 2016-04-03: qty 5

## 2016-04-03 MED ORDER — FAMOTIDINE IN NACL 20-0.9 MG/50ML-% IV SOLN
20.0000 mg | Freq: Once | INTRAVENOUS | Status: AC
Start: 2016-04-03 — End: 2016-04-03
  Administered 2016-04-03: 20 mg via INTRAVENOUS

## 2016-04-03 MED ORDER — SODIUM CHLORIDE 0.9 % IV SOLN
175.0000 mg/m2 | Freq: Once | INTRAVENOUS | Status: AC
  Administered 2016-04-03: 378 mg via INTRAVENOUS
  Filled 2016-04-03: qty 63

## 2016-04-03 MED ORDER — SODIUM CHLORIDE 0.9 % IV SOLN
20.0000 mg | Freq: Once | INTRAVENOUS | Status: AC
  Administered 2016-04-03: 20 mg via INTRAVENOUS
  Filled 2016-04-03: qty 2

## 2016-04-03 MED ORDER — SODIUM CHLORIDE 0.9 % IV SOLN
Freq: Once | INTRAVENOUS | Status: AC
  Administered 2016-04-03: 14:00:00 via INTRAVENOUS

## 2016-04-03 MED ORDER — DIPHENHYDRAMINE HCL 50 MG/ML IJ SOLN
50.0000 mg | Freq: Once | INTRAMUSCULAR | Status: AC
  Administered 2016-04-03: 50 mg via INTRAVENOUS

## 2016-04-03 MED ORDER — CARBOPLATIN CHEMO INJECTION 600 MG/60ML
595.6000 mg | Freq: Once | INTRAVENOUS | Status: AC
  Administered 2016-04-03: 600 mg via INTRAVENOUS
  Filled 2016-04-03: qty 60

## 2016-04-03 MED ORDER — FAMOTIDINE IN NACL 20-0.9 MG/50ML-% IV SOLN
INTRAVENOUS | Status: AC
  Filled 2016-04-03: qty 50

## 2016-04-03 MED ORDER — DIPHENHYDRAMINE HCL 50 MG/ML IJ SOLN
INTRAMUSCULAR | Status: AC
  Filled 2016-04-03: qty 1

## 2016-04-03 MED ORDER — PALONOSETRON HCL INJECTION 0.25 MG/5ML
0.2500 mg | Freq: Once | INTRAVENOUS | Status: AC
  Administered 2016-04-03: 0.25 mg via INTRAVENOUS

## 2016-04-03 NOTE — Patient Instructions (Signed)
[facility] Discharge Instructions for Patients Receiving Chemotherapy  Today you received the following chemotherapy agents:  Carboplatin, Taxol  To help prevent nausea and vomiting after your treatment, we encourage you to take your nausea medication as prescribed.   If you develop nausea and vomiting that is not controlled by your nausea medication, call the clinic.   BELOW ARE SYMPTOMS THAT SHOULD BE REPORTED IMMEDIATELY:  *FEVER GREATER THAN 100.5 F  *CHILLS WITH OR WITHOUT FEVER  NAUSEA AND VOMITING THAT IS NOT CONTROLLED WITH YOUR NAUSEA MEDICATION  *UNUSUAL SHORTNESS OF BREATH  *UNUSUAL BRUISING OR BLEEDING  TENDERNESS IN MOUTH AND THROAT WITH OR WITHOUT PRESENCE OF ULCERS  *URINARY PROBLEMS  *BOWEL PROBLEMS  UNUSUAL RASH Items with * indicate a potential emergency and should be followed up as soon as possible.  Feel free to call the clinic you have any questions or concerns. The clinic phone number is [phone].  Please show the CHEMO ALERT CARD at check-in to the Emergency Department and triage nurse.   

## 2016-04-05 ENCOUNTER — Ambulatory Visit (HOSPITAL_BASED_OUTPATIENT_CLINIC_OR_DEPARTMENT_OTHER): Payer: BLUE CROSS/BLUE SHIELD

## 2016-04-05 VITALS — BP 124/73 | HR 75 | Temp 97.9°F | Resp 18

## 2016-04-05 DIAGNOSIS — C3432 Malignant neoplasm of lower lobe, left bronchus or lung: Secondary | ICD-10-CM | POA: Diagnosis not present

## 2016-04-05 DIAGNOSIS — Z5189 Encounter for other specified aftercare: Secondary | ICD-10-CM | POA: Diagnosis not present

## 2016-04-05 MED ORDER — PEGFILGRASTIM INJECTION 6 MG/0.6ML ~~LOC~~
6.0000 mg | PREFILLED_SYRINGE | Freq: Once | SUBCUTANEOUS | Status: AC
  Administered 2016-04-05: 6 mg via SUBCUTANEOUS
  Filled 2016-04-05: qty 0.6

## 2016-04-05 NOTE — Patient Instructions (Signed)
Pegfilgrastim injection What is this medicine? PEGFILGRASTIM (PEG fil gra stim) is a long-acting granulocyte colony-stimulating factor that stimulates the growth of neutrophils, a type of white blood cell important in the body's fight against infection. It is used to reduce the incidence of fever and infection in patients with certain types of cancer who are receiving chemotherapy that affects the bone marrow, and to increase survival after being exposed to high doses of radiation. This medicine may be used for other purposes; ask your health care provider or pharmacist if you have questions. What should I tell my health care provider before I take this medicine? They need to know if you have any of these conditions: -kidney disease -latex allergy -ongoing radiation therapy -sickle cell disease -skin reactions to acrylic adhesives (On-Body Injector only) -an unusual or allergic reaction to pegfilgrastim, filgrastim, other medicines, foods, dyes, or preservatives -pregnant or trying to get pregnant -breast-feeding How should I use this medicine? This medicine is for injection under the skin. If you get this medicine at home, you will be taught how to prepare and give the pre-filled syringe or how to use the On-body Injector. Refer to the patient Instructions for Use for detailed instructions. Use exactly as directed. Take your medicine at regular intervals. Do not take your medicine more often than directed. It is important that you put your used needles and syringes in a special sharps container. Do not put them in a trash can. If you do not have a sharps container, call your pharmacist or healthcare provider to get one. Talk to your pediatrician regarding the use of this medicine in children. While this drug may be prescribed for selected conditions, precautions do apply. Overdosage: If you think you have taken too much of this medicine contact a poison control center or emergency room at  once. NOTE: This medicine is only for you. Do not share this medicine with others. What if I miss a dose? It is important not to miss your dose. Call your doctor or health care professional if you miss your dose. If you miss a dose due to an On-body Injector failure or leakage, a new dose should be administered as soon as possible using a single prefilled syringe for manual use. What may interact with this medicine? Interactions have not been studied. Give your health care provider a list of all the medicines, herbs, non-prescription drugs, or dietary supplements you use. Also tell them if you smoke, drink alcohol, or use illegal drugs. Some items may interact with your medicine. This list may not describe all possible interactions. Give your health care provider a list of all the medicines, herbs, non-prescription drugs, or dietary supplements you use. Also tell them if you smoke, drink alcohol, or use illegal drugs. Some items may interact with your medicine. What should I watch for while using this medicine? You may need blood work done while you are taking this medicine. If you are going to need a MRI, CT scan, or other procedure, tell your doctor that you are using this medicine (On-Body Injector only). What side effects may I notice from receiving this medicine? Side effects that you should report to your doctor or health care professional as soon as possible: -allergic reactions like skin rash, itching or hives, swelling of the face, lips, or tongue -dizziness -fever -pain, redness, or irritation at site where injected -pinpoint red spots on the skin -red or dark-brown urine -shortness of breath or breathing problems -stomach or side pain, or pain   at the shoulder -swelling -tiredness -trouble passing urine or change in the amount of urine Side effects that usually do not require medical attention (report to your doctor or health care professional if they continue or are  bothersome): -bone pain -muscle pain This list may not describe all possible side effects. Call your doctor for medical advice about side effects. You may report side effects to FDA at 1-800-FDA-1088. Where should I keep my medicine? Keep out of the reach of children. Store pre-filled syringes in a refrigerator between 2 and 8 degrees C (36 and 46 degrees F). Do not freeze. Keep in carton to protect from light. Throw away this medicine if it is left out of the refrigerator for more than 48 hours. Throw away any unused medicine after the expiration date. NOTE: This sheet is a summary. It may not cover all possible information. If you have questions about this medicine, talk to your doctor, pharmacist, or health care provider.    2016, [employer]vier/Gold Standard. (2014-07-23 14:30:14)  

## 2016-04-10 ENCOUNTER — Other Ambulatory Visit (HOSPITAL_BASED_OUTPATIENT_CLINIC_OR_DEPARTMENT_OTHER): Payer: BLUE CROSS/BLUE SHIELD

## 2016-04-10 DIAGNOSIS — C3432 Malignant neoplasm of lower lobe, left bronchus or lung: Secondary | ICD-10-CM

## 2016-04-10 LAB — COMPREHENSIVE METABOLIC PANEL
ALT: 19 U/L (ref 0–55)
AST: 19 U/L (ref 5–34)
Albumin: 3.7 g/dL (ref 3.5–5.0)
Alkaline Phosphatase: 96 U/L (ref 40–150)
Anion Gap: 9 mEq/L (ref 3–11)
BUN: 17.6 mg/dL (ref 7.0–26.0)
CO2: 27 mEq/L (ref 22–29)
Calcium: 10.1 mg/dL (ref 8.4–10.4)
Chloride: 105 mEq/L (ref 98–109)
Creatinine: 0.8 mg/dL (ref 0.7–1.3)
EGFR: >90 mL/min/{1.73_m2} (ref >90)
Glucose: 99 mg/dl (ref 70–140)
Potassium: 4.6 mEq/L (ref 3.5–5.1)
Sodium: 141 mEq/L (ref 136–145)
Total Bilirubin: 0.75 mg/dL (ref 0.20–1.20)
Total Protein: 7 g/dL (ref 6.4–8.3)

## 2016-04-10 LAB — CBC WITH DIFFERENTIAL/PLATELET
BASO%: 0.3 % (ref 0.0–2.0)
Basophils Absolute: 0 10*3/uL (ref 0.0–0.1)
EOS%: 1 % (ref 0.0–7.0)
Eosinophils Absolute: 0 10*3/uL (ref 0.0–0.5)
HCT: 33.1 % — ABNORMAL LOW (ref 38.4–49.9)
HGB: 11.4 g/dL — ABNORMAL LOW (ref 13.0–17.1)
LYMPH%: 24.6 % (ref 14.0–49.0)
MCH: 35.8 pg — ABNORMAL HIGH (ref 27.2–33.4)
MCHC: 34.4 g/dL (ref 32.0–36.0)
MCV: 104.1 fL — ABNORMAL HIGH (ref 79.3–98.0)
MONO#: 0.5 10*3/uL (ref 0.1–0.9)
MONO%: 14.9 % — ABNORMAL HIGH (ref 0.0–14.0)
NEUT#: 1.8 10*3/uL (ref 1.5–6.5)
NEUT%: 59.2 % (ref 39.0–75.0)
Platelets: 45 10*3/uL — ABNORMAL LOW (ref 140–400)
RBC: 3.18 10*6/uL — ABNORMAL LOW (ref 4.20–5.82)
RDW: 15.5 % — ABNORMAL HIGH (ref 11.0–14.6)
WBC: 3.1 10*3/uL — ABNORMAL LOW (ref 4.0–10.3)
lymph#: 0.8 10*3/uL — ABNORMAL LOW (ref 0.9–3.3)

## 2016-04-17 ENCOUNTER — Other Ambulatory Visit (HOSPITAL_BASED_OUTPATIENT_CLINIC_OR_DEPARTMENT_OTHER): Payer: BLUE CROSS/BLUE SHIELD

## 2016-04-17 ENCOUNTER — Ambulatory Visit: Payer: BLUE CROSS/BLUE SHIELD | Admitting: Internal Medicine

## 2016-04-17 ENCOUNTER — Ambulatory Visit: Payer: BLUE CROSS/BLUE SHIELD

## 2016-04-17 DIAGNOSIS — C3432 Malignant neoplasm of lower lobe, left bronchus or lung: Secondary | ICD-10-CM

## 2016-04-17 LAB — COMPREHENSIVE METABOLIC PANEL
ALT: 13 U/L (ref 0–55)
AST: 16 U/L (ref 5–34)
Albumin: 3.6 g/dL (ref 3.5–5.0)
Alkaline Phosphatase: 110 U/L (ref 40–150)
Anion Gap: 10 mEq/L (ref 3–11)
BUN: 8.2 mg/dL (ref 7.0–26.0)
CO2: 25 mEq/L (ref 22–29)
Calcium: 9.5 mg/dL (ref 8.4–10.4)
Chloride: 105 mEq/L (ref 98–109)
Creatinine: 0.8 mg/dL (ref 0.7–1.3)
EGFR: >90 mL/min/{1.73_m2} (ref >90)
Glucose: 108 mg/dl (ref 70–140)
Potassium: 4.2 mEq/L (ref 3.5–5.1)
Sodium: 139 mEq/L (ref 136–145)
Total Bilirubin: 0.4 mg/dL (ref 0.20–1.20)
Total Protein: 7.1 g/dL (ref 6.4–8.3)

## 2016-04-17 LAB — CBC WITH DIFFERENTIAL/PLATELET
BASO%: 0.5 % (ref 0.0–2.0)
Basophils Absolute: 0 10*3/uL (ref 0.0–0.1)
EOS%: 0.2 % (ref 0.0–7.0)
Eosinophils Absolute: 0 10*3/uL (ref 0.0–0.5)
HCT: 36.2 % — ABNORMAL LOW (ref 38.4–49.9)
HGB: 12.1 g/dL — ABNORMAL LOW (ref 13.0–17.1)
LYMPH%: 9.4 % — ABNORMAL LOW (ref 14.0–49.0)
MCH: 35.3 pg — ABNORMAL HIGH (ref 27.2–33.4)
MCHC: 33.4 g/dL (ref 32.0–36.0)
MCV: 105.8 fL — ABNORMAL HIGH (ref 79.3–98.0)
MONO#: 0.5 10*3/uL (ref 0.1–0.9)
MONO%: 6.7 % (ref 0.0–14.0)
NEUT#: 6.8 10*3/uL — ABNORMAL HIGH (ref 1.5–6.5)
NEUT%: 83.2 % — ABNORMAL HIGH (ref 39.0–75.0)
Platelets: 92 10*3/uL — ABNORMAL LOW (ref 140–400)
RBC: 3.42 10*6/uL — ABNORMAL LOW (ref 4.20–5.82)
RDW: 17.1 % — ABNORMAL HIGH (ref 11.0–14.6)
WBC: 8.2 10*3/uL (ref 4.0–10.3)
lymph#: 0.8 10*3/uL — ABNORMAL LOW (ref 0.9–3.3)

## 2016-04-19 ENCOUNTER — Ambulatory Visit: Payer: BLUE CROSS/BLUE SHIELD

## 2016-04-24 ENCOUNTER — Ambulatory Visit (HOSPITAL_COMMUNITY)
Admission: RE | Admit: 2016-04-24 | Discharge: 2016-04-24 | Disposition: A | Discharge status: Home / Self Care | Payer: BLUE CROSS/BLUE SHIELD | Source: Ambulatory Visit | Attending: Internal Medicine | Admitting: Internal Medicine

## 2016-04-24 ENCOUNTER — Encounter (HOSPITAL_COMMUNITY): Payer: Self-pay

## 2016-04-24 ENCOUNTER — Other Ambulatory Visit (HOSPITAL_BASED_OUTPATIENT_CLINIC_OR_DEPARTMENT_OTHER): Payer: BLUE CROSS/BLUE SHIELD

## 2016-04-24 DIAGNOSIS — R918 Other nonspecific abnormal finding of lung field: Secondary | ICD-10-CM | POA: Diagnosis not present

## 2016-04-24 DIAGNOSIS — I517 Cardiomegaly: Secondary | ICD-10-CM | POA: Diagnosis not present

## 2016-04-24 DIAGNOSIS — I700 Atherosclerosis of aorta: Secondary | ICD-10-CM | POA: Diagnosis not present

## 2016-04-24 DIAGNOSIS — Z5111 Encounter for antineoplastic chemotherapy: Secondary | ICD-10-CM

## 2016-04-24 DIAGNOSIS — Z9221 Personal history of antineoplastic chemotherapy: Secondary | ICD-10-CM | POA: Diagnosis not present

## 2016-04-24 DIAGNOSIS — C3432 Malignant neoplasm of lower lobe, left bronchus or lung: Secondary | ICD-10-CM | POA: Diagnosis not present

## 2016-04-24 DIAGNOSIS — I2510 Atherosclerotic heart disease of native coronary artery without angina pectoris: Secondary | ICD-10-CM | POA: Diagnosis not present

## 2016-04-24 DIAGNOSIS — I252 Old myocardial infarction: Secondary | ICD-10-CM | POA: Insufficient documentation

## 2016-04-24 LAB — CBC WITH DIFFERENTIAL/PLATELET
BASO%: 0.3 % (ref 0.0–2.0)
Basophils Absolute: 0 10*3/uL (ref 0.0–0.1)
EOS%: 0.3 % (ref 0.0–7.0)
Eosinophils Absolute: 0 10*3/uL (ref 0.0–0.5)
HCT: 34.6 % — ABNORMAL LOW (ref 38.4–49.9)
HGB: 12 g/dL — ABNORMAL LOW (ref 13.0–17.1)
LYMPH%: 14.9 % (ref 14.0–49.0)
MCH: 35.6 pg — ABNORMAL HIGH (ref 27.2–33.4)
MCHC: 34.7 g/dL (ref 32.0–36.0)
MCV: 102.7 fL — ABNORMAL HIGH (ref 79.3–98.0)
MONO#: 0.7 10*3/uL (ref 0.1–0.9)
MONO%: 10.5 % (ref 0.0–14.0)
NEUT#: 4.6 10*3/uL (ref 1.5–6.5)
NEUT%: 74 % (ref 39.0–75.0)
Platelets: 114 10*3/uL — ABNORMAL LOW (ref 140–400)
RBC: 3.37 10*6/uL — ABNORMAL LOW (ref 4.20–5.82)
RDW: 15.6 % — ABNORMAL HIGH (ref 11.0–14.6)
WBC: 6.2 10*3/uL (ref 4.0–10.3)
lymph#: 0.9 10*3/uL (ref 0.9–3.3)

## 2016-04-24 LAB — COMPREHENSIVE METABOLIC PANEL
ALT: 12 U/L (ref 0–55)
AST: 17 U/L (ref 5–34)
Albumin: 3.7 g/dL (ref 3.5–5.0)
Alkaline Phosphatase: 104 U/L (ref 40–150)
Anion Gap: 12 mEq/L — ABNORMAL HIGH (ref 3–11)
BUN: 13.2 mg/dL (ref 7.0–26.0)
CO2: 26 mEq/L (ref 22–29)
Calcium: 10.1 mg/dL (ref 8.4–10.4)
Chloride: 103 mEq/L (ref 98–109)
Creatinine: 0.8 mg/dL (ref 0.7–1.3)
EGFR: >90 mL/min/{1.73_m2} (ref >90)
Glucose: 102 mg/dl (ref 70–140)
Potassium: 4.8 mEq/L (ref 3.5–5.1)
Sodium: 141 mEq/L (ref 136–145)
Total Bilirubin: 0.56 mg/dL (ref 0.20–1.20)
Total Protein: 7.5 g/dL (ref 6.4–8.3)

## 2016-04-24 MED ORDER — IOPAMIDOL (ISOVUE-300) INJECTION 61%
75.0000 mL | Freq: Once | INTRAVENOUS | Status: AC | PRN
  Administered 2016-04-24: 75 mL via INTRAVENOUS

## 2016-04-26 ENCOUNTER — Ambulatory Visit (HOSPITAL_BASED_OUTPATIENT_CLINIC_OR_DEPARTMENT_OTHER): Payer: BLUE CROSS/BLUE SHIELD | Admitting: Internal Medicine

## 2016-04-26 ENCOUNTER — Encounter: Payer: Self-pay | Admitting: Internal Medicine

## 2016-04-26 VITALS — BP 132/73 | HR 62 | Temp 98.1°F | Resp 17 | Ht 71.0 in | Wt 199.6 lb

## 2016-04-26 DIAGNOSIS — C3432 Malignant neoplasm of lower lobe, left bronchus or lung: Secondary | ICD-10-CM

## 2016-04-26 DIAGNOSIS — K769 Liver disease, unspecified: Secondary | ICD-10-CM | POA: Diagnosis not present

## 2016-04-26 NOTE — Progress Notes (Signed)
[place] Telephone:(336) [phone]   [phone]  OFFICE PROGRESS NOTE  [name], MD [place] [address]  DIAGNOSIS: Stage IIIB (T1b, N3, M0) non-small cell lung cancer favoring adenocarcinoma presented with left lower lobe pulmonary nodule in addition to left hilar and bilateral mediastinal lymphadenopathy in addition to right supraclavicular lymph nodes diagnosed in April 2017.  PRIOR THERAPY:  1) Concurrent chemoradiation with weekly carboplatin for AUC of 2 and paclitaxel 45 mg/M2 started on 11/15/2015. He is status post 6 cycles. Last dose was getting 12/20/2015 with partial response. 2) Consolidation chemotherapy with carboplatin for AUC of 5 and paclitaxel 175 MG/M2 every 3 weeks with Neulasta support. Status post 3 cycles.   CURRENT THERAPY: None.  INTERVAL HISTORY: Brian White 65 y.o. male returns to the clinic today for follow-up visit accompanied by his wife. The patient is feeling fine today with no specific complaints. He completed 3 cycles of consolidation chemotherapy with carboplatin and paclitaxel and tolerated his treatment well. The patient denied having any significant chest pain, shortness of breath, cough or hemoptysis. He has no nausea or vomiting, no fever or chills. He has no significant weight loss or night sweats. The patient had repeat CT scan of the chest performed recently and he is here for evaluation and discussion of his scan results.  MEDICAL HISTORY: Past Medical History:  Diagnosis Date  . Adenocarcinoma, lung ([place]) 10/08/2015   2.4cm spiculated LLL nodule suspicious for primary bronchogenic carcinoma by CT 09/2015 - adenocarcinoma by endobronchial biopsy  . Allergy   . Arthritis   . CAD (coronary artery disease)    mild MI; pt unaware - seen on stress test  . COPD (chronic obstructive pulmonary disease) ([place]) 06/26/2011   Mild centrilobular and paraseptal emphysema by CT 09/2015   . ED  (erectile dysfunction)   . H/O anaphylactic shock 2013   hives,hypotension,syncope  . H/O seasonal allergies   . Herpes zoster 12/06/2015  . Hypertension   . Polycythemia 01/25/2014  . Prostate cancer [facility]) 2013   s/p radiation therapy March 2013, planned f/u with uro after XRT  . Radiation 09/18/2011 through 11/10/2011   Prostate 7600 cGy 40 sessions, seminal vesicles 5600 cGy 40 sessions    . Radiation 11/10/15-12/22/15   chest 60 Gy  . Shortness of breath dyspnea   . Smoker   . Thoracic aorta atherosclerosis ([place]) 10/08/2015   Mild by CT 09/2015     ALLERGIES:  is allergic to onion.  MEDICATIONS:  Current Outpatient Prescriptions  Medication Sig Dispense Refill  . aspirin 81 MG tablet Take 81 mg by mouth daily. Reported on 12/21/2015    . budesonide-formoterol (SYMBICORT) 160-4.5 MCG/ACT inhaler Inhale 1 puff into the lungs 2 (two) times daily. 1 Inhaler 3  . cetirizine (ZYRTEC) 10 MG chewable tablet Chew 10 mg by mouth daily. Reported on 12/20/2015    . EPINEPHrine (EPIPEN) 0.3 mg/0.3 mL DEVI Inject 0.3 mLs (0.3 mg total) into the muscle once. 1 Device prn  . ibuprofen (ADVIL,MOTRIN) 200 MG tablet Take 600 mg by mouth every 6 (six) hours as needed (Pain). Reported on 12/21/2015    . losartan-hydrochlorothiazide (HYZAAR) 100-25 MG tablet Take 1 tablet by mouth daily. 90 tablet 3  . oxyCODONE-acetaminophen (PERCOCET/ROXICET) 5-325 MG tablet Take 1 tablet by mouth every 4 (four) hours as needed for severe pain. 30 tablet 0  . prochlorperazine (COMPAZINE) 10 MG tablet Take 1 tablet (10 mg total) by mouth  every 6 (six) hours as needed for nausea or vomiting. 30 tablet 0  . sildenafil (VIAGRA) 100 MG tablet Take 0.5-1 tablets (50-100 mg total) by mouth daily as needed for erectile dysfunction. 4 tablet 0  . sucralfate (CARAFATE) 1 GM/10ML suspension Take 10 mLs (1 g total) by mouth 4 (four) times daily -  with meals and at bedtime. 420 mL 0  . valACYclovir (VALTREX) 1000 MG  tablet Take 1 tablet (1,000 mg total) by mouth 3 (three) times daily. 30 tablet 0  . Wound Dressings (SONAFINE) Apply 1 application topically 2 (two) times daily.     No current facility-administered medications for this visit.     SURGICAL HISTORY:  Past Surgical History:  Procedure Laterality Date  . COLONOSCOPY W/ BIOPSIES AND POLYPECTOMY  10/11/15   mult TAs, 3cm rectal polypoid lesion; rec further treatment, diverticulosis ([facility])  . ENDOBRONCHIAL ULTRASOUND Bilateral 10/18/2015   Procedure: ENDOBRONCHIAL ULTRASOUND;  Surgeon: Javier Glazier, MD;  Location: [facility];  Service: Cardiopulmonary;  Laterality: Bilateral;  . HERNIA REPAIR Right 2005   with mesh  . INSERTION PROSTATE RADIATION SEED  08/2011   prostate cancer, [place]    REVIEW OF SYSTEMS:  Constitutional: positive for fatigue Eyes: negative Ears, nose, mouth, throat, and face: negative Respiratory: positive for cough Cardiovascular: negative Gastrointestinal: negative Genitourinary:negative Integument/breast: negative Hematologic/lymphatic: negative Musculoskeletal:negative Neurological: negative Behavioral/Psych: negative Endocrine: negative Allergic/Immunologic: negative   PHYSICAL EXAMINATION: General appearance: alert, cooperative and no distress Head: Normocephalic, without obvious abnormality, atraumatic Neck: no adenopathy, no JVD, supple, symmetrical, trachea midline and thyroid not enlarged, symmetric, no tenderness/mass/nodules Lymph nodes: Cervical, supraclavicular, and axillary nodes normal. Resp: clear to auscultation bilaterally Back: symmetric, no curvature. ROM normal. No CVA tenderness. Cardio: regular rate and rhythm, S1, S2 normal, no murmur, click, rub or gallop GI: soft, non-tender; bowel sounds normal; no masses,  no organomegaly Extremities: extremities normal, atraumatic, no cyanosis or edema Neurologic: Alert and oriented X 3, normal strength and tone. Normal symmetric  reflexes. Normal coordination and gait    ECOG PERFORMANCE STATUS: 1 - Symptomatic but completely ambulatory  Blood pressure 132/73, pulse 62, temperature 98.1 F (36.7 C), temperature source Oral, resp. rate 17, height '5\' 11"'$  (1.803 m), weight 199 lb 9.6 oz (90.5 kg), SpO2 100 %.  LABORATORY DATA: Lab Results  Component Value Date   WBC 6.2 04/24/2016   HGB 12.0 (L) 04/24/2016   HCT 34.6 (L) 04/24/2016   MCV 102.7 (H) 04/24/2016   PLT 114 (L) 04/24/2016      Chemistry      Component Value Date/Time   NA 141 04/24/2016 1023   K 4.8 04/24/2016 1023   CL 99 08/27/2015 0946   CO2 26 04/24/2016 1023   BUN 13.2 04/24/2016 1023   CREATININE 0.8 04/24/2016 1023      Component Value Date/Time   CALCIUM 10.1 04/24/2016 1023   ALKPHOS 104 04/24/2016 1023   AST 17 04/24/2016 1023   ALT 12 04/24/2016 1023   BILITOT 0.56 04/24/2016 1023       RADIOGRAPHIC STUDIES: Ct Chest W Contrast  Result Date: 04/24/2016 CLINICAL DATA:  Followup left lower lobe lung carcinoma. Status post chemotherapy and radiation therapy. Cough for 4 days. EXAM: CT CHEST WITH CONTRAST TECHNIQUE: Multidetector CT imaging of the chest was performed during intravenous contrast administration. CONTRAST:  63m ISOVUE-300 IOPAMIDOL (ISOVUE-300) INJECTION 61% COMPARISON:  01/27/2016 FINDINGS: Cardiovascular: Stable mild cardiomegaly. Old left ventricular calcified myocardial infarct again demonstrated. Coronary artery calcification. Aortic atherosclerosis. Mediastinum/Nodes:  Mild mediastinal lymphadenopathy remains stable, with largest lymph node in right paratracheal region measuring 13 mm on image 49/2 compared to 13 mm previously. 10 mm right axillary lymph node on image 59/2 is also unchanged. No new or increased sites of lymphadenopathy identified. Lungs/Pleura: Irregular pulmonary nodule in the left lower lobe measures 1.6 x 1.1 cm on image 93/7 compared to 1.5 x 1.3 cm previously. There is increased adjacent  airspace opacity showing air bronchograms compared to previous study, which is likely due to progressive radiation pneumonitis or possibly pneumonia, with neoplasm considered much less likely. No evidence of pleural effusion. Right lung is clear. Upper Abdomen: An 8 mm flash filling hypervascular lesion is seen in segment 4B of the left lobe on image 139/2. This was not definitely seen on previous studies but may be due to differences in timing of contrast bolus. Normal adrenal glands. Musculoskeletal: No suspicious bone lesions or other significant abnormality. IMPRESSION: No significant interval change in 1.6 cm left lower lobe pulmonary nodule, consistent with known primary lung carcinoma. Increased adjacent posterior left lower lobe airspace opacity with air bronchograms, likely due to progressive radiation pneumonitis or possibly pneumonia, with neoplasm considered much less likely. Recommend continued followup by chest CT in 3-4 months. Stable mild mediastinal and right hilar lymphadenopathy. **An incidental finding of potential clinical significance has been found. 8 mm flash filling hypervascular lesion in left hepatic lobe which is indeterminate. Given small size of this lesion, followup by abdomen MRI without and with contrast is recommended in 3-6 months. This recommendation follows ACR consensus guidelines: Management of Incidental Liver Lesions on CT: A White Paper of the ACR Incidental Findings Committee. J Am Coll Radiol 2017; article currently in press.** Stable cardiomegaly, old calcified left ventricular myocardial infarct, aortic atherosclerosis and coronary artery calcification . Electronically Signed   By: Earle Gell M.D.   On: 04/24/2016 14:03    ASSESSMENT AND PLAN: This is a very pleasant 65 years old white male with stage IIIB non-small cell lung cancer status post course of concurrent chemoradiation with weekly carboplatin and paclitaxel for 6 cycles.  He tolerated his treatment fairly  well except for mild odynophagia and dysphagia. This was followed by 3 cycles of consolidation chemotherapy with with carboplatin and paclitaxel. The recent CT scan of the chest showed no evidence for disease progression. There was flash filling hypervascular lesion in the left hepatic lobe. I discussed the results with the patient. I recommended for him to continue observation with close monitoring on the upcoming scan. He would come back for follow-up visit in 3 months with repeat CT scan of the chest. He was advised to call immediately if he has any concerning symptoms in the interval. The patient voices understanding of current disease status and treatment options and is in agreement with the current care plan.  All questions were answered. The patient knows to call the clinic with any problems, questions or concerns. We can certainly see the patient much sooner if necessary.  Disclaimer: This note was dictated with voice recognition software. Similar sounding words can inadvertently be transcribed and may not be corrected upon review.

## 2016-05-18 ENCOUNTER — Telehealth: Payer: Self-pay | Admitting: General Practice

## 2016-05-18 NOTE — Telephone Encounter (Signed)
Left message regarding appt for 07/25/16.

## 2016-06-11 ENCOUNTER — Encounter: Payer: Self-pay | Admitting: Internal Medicine

## 2016-07-25 ENCOUNTER — Ambulatory Visit (HOSPITAL_COMMUNITY)
Admission: RE | Admit: 2016-07-25 | Discharge: 2016-07-25 | Disposition: A | Discharge status: Home / Self Care | Payer: BLUE CROSS/BLUE SHIELD | Source: Ambulatory Visit | Attending: Internal Medicine | Admitting: Internal Medicine

## 2016-07-25 ENCOUNTER — Other Ambulatory Visit (HOSPITAL_BASED_OUTPATIENT_CLINIC_OR_DEPARTMENT_OTHER): Payer: BLUE CROSS/BLUE SHIELD

## 2016-07-25 ENCOUNTER — Encounter (HOSPITAL_COMMUNITY): Payer: Self-pay

## 2016-07-25 DIAGNOSIS — C3432 Malignant neoplasm of lower lobe, left bronchus or lung: Secondary | ICD-10-CM | POA: Insufficient documentation

## 2016-07-25 DIAGNOSIS — R918 Other nonspecific abnormal finding of lung field: Secondary | ICD-10-CM | POA: Diagnosis not present

## 2016-07-25 LAB — CBC WITH DIFFERENTIAL/PLATELET
BASO%: 0.2 % (ref 0.0–2.0)
Basophils Absolute: 0 10*3/uL (ref 0.0–0.1)
EOS%: 0.8 % (ref 0.0–7.0)
Eosinophils Absolute: 0 10*3/uL (ref 0.0–0.5)
HCT: 41.5 % (ref 38.4–49.9)
HGB: 14.3 g/dL (ref 13.0–17.1)
LYMPH%: 22.4 % (ref 14.0–49.0)
MCH: 34.5 pg — ABNORMAL HIGH (ref 27.2–33.4)
MCHC: 34.5 g/dL (ref 32.0–36.0)
MCV: 100 fL — ABNORMAL HIGH (ref 79.3–98.0)
MONO#: 0.6 10*3/uL (ref 0.1–0.9)
MONO%: 11.9 % (ref 0.0–14.0)
NEUT#: 3.4 10*3/uL (ref 1.5–6.5)
NEUT%: 64.7 % (ref 39.0–75.0)
Platelets: 138 10*3/uL — ABNORMAL LOW (ref 140–400)
RBC: 4.15 10*6/uL — ABNORMAL LOW (ref 4.20–5.82)
RDW: 13 % (ref 11.0–14.6)
WBC: 5.2 10*3/uL (ref 4.0–10.3)
lymph#: 1.2 10*3/uL (ref 0.9–3.3)

## 2016-07-25 LAB — COMPREHENSIVE METABOLIC PANEL
ALT: 15 U/L (ref 0–55)
AST: 19 U/L (ref 5–34)
Albumin: 3.7 g/dL (ref 3.5–5.0)
Alkaline Phosphatase: 77 U/L (ref 40–150)
Anion Gap: 6 mEq/L (ref 3–11)
BUN: 7.1 mg/dL (ref 7.0–26.0)
CO2: 29 mEq/L (ref 22–29)
Calcium: 9.7 mg/dL (ref 8.4–10.4)
Chloride: 104 mEq/L (ref 98–109)
Creatinine: 0.8 mg/dL (ref 0.7–1.3)
EGFR: >90 mL/min/{1.73_m2} (ref >90)
Glucose: 91 mg/dl (ref 70–140)
Potassium: 4.1 mEq/L (ref 3.5–5.1)
Sodium: 140 mEq/L (ref 136–145)
Total Bilirubin: 0.35 mg/dL (ref 0.20–1.20)
Total Protein: 6.9 g/dL (ref 6.4–8.3)

## 2016-07-25 MED ORDER — IOPAMIDOL (ISOVUE-300) INJECTION 61%
INTRAVENOUS | Status: AC
  Filled 2016-07-25: qty 100

## 2016-07-25 MED ORDER — IOPAMIDOL (ISOVUE-300) INJECTION 61%
100.0000 mL | Freq: Once | INTRAVENOUS | Status: AC | PRN
  Administered 2016-07-25: 100 mL via INTRAVENOUS

## 2016-08-02 ENCOUNTER — Encounter: Payer: Self-pay | Admitting: Internal Medicine

## 2016-08-02 ENCOUNTER — Telehealth: Payer: Self-pay | Admitting: Internal Medicine

## 2016-08-02 ENCOUNTER — Ambulatory Visit (HOSPITAL_BASED_OUTPATIENT_CLINIC_OR_DEPARTMENT_OTHER): Payer: BLUE CROSS/BLUE SHIELD | Admitting: Internal Medicine

## 2016-08-02 VITALS — BP 147/98 | HR 62 | Temp 98.4°F | Resp 18 | Ht 71.0 in | Wt 201.4 lb

## 2016-08-02 DIAGNOSIS — I10 Essential (primary) hypertension: Secondary | ICD-10-CM | POA: Diagnosis not present

## 2016-08-02 DIAGNOSIS — C3432 Malignant neoplasm of lower lobe, left bronchus or lung: Secondary | ICD-10-CM | POA: Diagnosis not present

## 2016-08-02 DIAGNOSIS — J449 Chronic obstructive pulmonary disease, unspecified: Secondary | ICD-10-CM

## 2016-08-02 NOTE — Telephone Encounter (Signed)
Gave patient avs report and appointments for April  °

## 2016-08-02 NOTE — Progress Notes (Signed)
[name] Telephone:(336) [phone]   Fax:(336) [phone]  OFFICE PROGRESS NOTE  Brian Bush, MD [place] [address]  DIAGNOSIS: Stage IIIB (T1b, N3, M0) non-small cell lung cancer favoring adenocarcinoma presented with left lower lobe pulmonary nodule in addition to left hilar and bilateral mediastinal lymphadenopathy in addition to right supraclavicular lymph nodes diagnosed in April 2017.  PRIOR THERAPY: 1) Concurrent chemoradiation with weekly carboplatin for AUC of 2 and paclitaxel 45 mg/M2 started on 11/15/2015. He is status post 6 cycles. Last dose was getting 12/20/2015 with partial response. 2) Consolidation chemotherapy with carboplatin for AUC of 5 and paclitaxel 175 MG/M2 every 3 weeks with Neulasta support. Status post 3 cycles.  CURRENT THERAPY: Observation.  INTERVAL HISTORY: Brian White 66 y.o. male returns to the clinic today for three-month follow-up visit. The patient is doing fine today with no specific complaints. He denied having any weight loss or night sweats. He has no chest pain, shortness of breath, cough or hemoptysis. He has no fever or chills. He has no nausea or vomiting. The patient is currently on observation. He is here today for evaluation with repeat CT scan of the chest for restaging of his disease.  MEDICAL HISTORY: Past Medical History:  Diagnosis Date  . Adenocarcinoma, lung ([place]) 10/08/2015   2.4cm spiculated LLL nodule suspicious for primary bronchogenic carcinoma by CT 09/2015 - adenocarcinoma by endobronchial biopsy  . Allergy   . Arthritis   . CAD (coronary artery disease)    mild MI; pt unaware - seen on stress test  . COPD (chronic obstructive pulmonary disease) ([place]) 06/26/2011   Mild centrilobular and paraseptal emphysema by CT 09/2015   . ED (erectile dysfunction)   . H/O anaphylactic shock 2013   hives,hypotension,syncope  . H/O seasonal allergies   . Herpes zoster 12/06/2015  .  Hypertension   . Polycythemia 01/25/2014  . Prostate cancer [facility]) 2013   s/p radiation therapy March 2013, planned f/u with uro after XRT  . Radiation 09/18/2011 through 11/10/2011   Prostate 7600 cGy 40 sessions, seminal vesicles 5600 cGy 40 sessions    . Radiation 11/10/15-12/22/15   chest 60 Gy  . Shortness of breath dyspnea   . Smoker   . Thoracic aorta atherosclerosis ([place]) 10/08/2015   Mild by CT 09/2015     ALLERGIES:  is allergic to onion.  MEDICATIONS:  Current Outpatient Prescriptions  Medication Sig Dispense Refill  . aspirin 81 MG tablet Take 81 mg by mouth daily. Reported on 12/21/2015    . budesonide-formoterol (SYMBICORT) 160-4.5 MCG/ACT inhaler Inhale 1 puff into the lungs 2 (two) times daily. 1 Inhaler 3  . cetirizine (ZYRTEC) 10 MG chewable tablet Chew 10 mg by mouth daily. Reported on 12/20/2015    . losartan-hydrochlorothiazide (HYZAAR) 100-25 MG tablet Take 1 tablet by mouth daily. 90 tablet 3  . EPINEPHrine (EPIPEN) 0.3 mg/0.3 mL DEVI Inject 0.3 mLs (0.3 mg total) into the muscle once. (Patient not taking: Reported on 08/02/2016) 1 Device prn  . ibuprofen (ADVIL,MOTRIN) 200 MG tablet Take 600 mg by mouth every 6 (six) hours as needed (Pain). Reported on 12/21/2015    . oxyCODONE-acetaminophen (PERCOCET/ROXICET) 5-325 MG tablet Take 1 tablet by mouth every 4 (four) hours as needed for severe pain. (Patient not taking: Reported on 08/02/2016) 30 tablet 0  . prochlorperazine (COMPAZINE) 10 MG tablet Take 1 tablet (10 mg total) by mouth every 6 (six) hours as needed for nausea or  vomiting. (Patient not taking: Reported on 08/02/2016) 30 tablet 0  . sildenafil (VIAGRA) 100 MG tablet Take 0.5-1 tablets (50-100 mg total) by mouth daily as needed for erectile dysfunction. 4 tablet 0   No current facility-administered medications for this visit.     SURGICAL HISTORY:  Past Surgical History:  Procedure Laterality Date  . COLONOSCOPY W/ BIOPSIES AND POLYPECTOMY   10/11/15   mult TAs, 3cm rectal polypoid lesion; rec further treatment, diverticulosis ([place])  . ENDOBRONCHIAL ULTRASOUND Bilateral 10/18/2015   Procedure: ENDOBRONCHIAL ULTRASOUND;  Surgeon: Javier Glazier, MD;  Location: [facility];  Service: Cardiopulmonary;  Laterality: Bilateral;  . HERNIA REPAIR Right 2005   with mesh  . INSERTION PROSTATE RADIATION SEED  08/2011   prostate cancer, [place]    REVIEW OF SYSTEMS:  A comprehensive review of systems was negative.   PHYSICAL EXAMINATION: General appearance: alert, cooperative and no distress Head: Normocephalic, without obvious abnormality, atraumatic Neck: no adenopathy, no JVD, supple, symmetrical, trachea midline and thyroid not enlarged, symmetric, no tenderness/mass/nodules Lymph nodes: Cervical, supraclavicular, and axillary nodes normal. Resp: clear to auscultation bilaterally Back: symmetric, no curvature. ROM normal. No CVA tenderness. Cardio: regular rate and rhythm, S1, S2 normal, no murmur, click, rub or gallop GI: soft, non-tender; bowel sounds normal; no masses,  no organomegaly Extremities: extremities normal, atraumatic, no cyanosis or edema  ECOG PERFORMANCE STATUS: 1 - Symptomatic but completely ambulatory  Blood pressure (!) 147/98, pulse 62, temperature 98.4 F (36.9 C), temperature source Oral, resp. rate 18, height '5\' 11"'$  (1.803 m), weight 201 lb 6.4 oz (91.4 kg), SpO2 100 %.  LABORATORY DATA: Lab Results  Component Value Date   WBC 5.2 07/25/2016   HGB 14.3 07/25/2016   HCT 41.5 07/25/2016   MCV 100.0 (H) 07/25/2016   PLT 138 (L) 07/25/2016      Chemistry      Component Value Date/Time   NA 140 07/25/2016 0822   K 4.1 07/25/2016 0822   CL 99 08/27/2015 0946   CO2 29 07/25/2016 0822   BUN 7.1 07/25/2016 0822   CREATININE 0.8 07/25/2016 0822      Component Value Date/Time   CALCIUM 9.7 07/25/2016 0822   ALKPHOS 77 07/25/2016 0822   AST 19 07/25/2016 0822   ALT 15 07/25/2016 0822    BILITOT 0.35 07/25/2016 0822       RADIOGRAPHIC STUDIES: Ct Chest W Contrast  Result Date: 07/25/2016 CLINICAL DATA:  LEFT lung cancer diagnosed March 2017. Chemotherapy radiation therapy complete. More remote history of prostate cancer diagnosed 2013. EXAM: CT CHEST WITH CONTRAST TECHNIQUE: Multidetector CT imaging of the chest was performed during intravenous contrast administration. CONTRAST:  140m ISOVUE-300 IOPAMIDOL (ISOVUE-300) INJECTION 61% COMPARISON:  CT chest 04/24/2016 FINDINGS: Cardiovascular: Coronary artery calcification and aortic atherosclerotic calcification. Mediastinum/Nodes: No axillary supraclavicular adenopathy. No mediastinal hilar adenopathy. Lungs/Pleura: There is bands of arc like parenchymal thickening in RIGHT lower lobe. LEFT lower lobe nodularity Ms. Less well-defined. The degree of parenchymal thickening is decreased. There is mild bronchial thickening in the lower lobes and upper lobes. No focal airspace disease. No suspicious pulmonary nodules. No ground-glass opacities. Upper Abdomen: Limited view of the liver, kidneys, pancreas are unremarkable. Normal adrenal glands. Musculoskeletal: No aggressive osseous lesion. IMPRESSION: 1. Arc like bands of parenchymal thickening in the LEFT lower lobe are decreased in prominence. No discrete measurable nodularity. 2. Central  bronchial thickening suggests small airways disease. Electronically Signed   By: SSuzy BouchardM.D.   On: 07/25/2016 12:55  ASSESSMENT AND PLAN: This is a very pleasant 66 years old white male with a stage IIIB non-small cell lung cancer favoring adenocarcinoma diagnosed in February 2017 status post concurrent chemoradiation followed by consolidation chemotherapy. The patient is currently on observation and he is feeling fine with no specific complaints. The recent CT scan of the chest showed no evidence for disease progression. I discussed the scan results with the patient and his wife today. I  recommended for him to continue on observation with repeat CT scan of the chest in 3 months. For hypertension, he was advised to take his blood pressure medication as prescribed. He was advised to present immediately if he has any concerning symptoms in the interval. The patient voices understanding of current disease status and treatment options and is in agreement with the current care plan.  All questions were answered. The patient knows to call the clinic with any problems, questions or concerns. We can certainly see the patient much sooner if necessary.  I spent 10 minutes counseling the patient face to face. The total time spent in the appointment was 15 minutes.  Disclaimer: This note was dictated with voice recognition software. Similar sounding words can inadvertently be transcribed and may not be corrected upon review.

## 2016-10-12 ENCOUNTER — Encounter: Payer: Self-pay | Admitting: Internal Medicine

## 2016-11-01 ENCOUNTER — Other Ambulatory Visit (HOSPITAL_BASED_OUTPATIENT_CLINIC_OR_DEPARTMENT_OTHER): Payer: BLUE CROSS/BLUE SHIELD

## 2016-11-01 ENCOUNTER — Ambulatory Visit (HOSPITAL_COMMUNITY)
Admission: RE | Admit: 2016-11-01 | Discharge: 2016-11-01 | Disposition: A | Discharge status: Home / Self Care | Payer: BLUE CROSS/BLUE SHIELD | Source: Ambulatory Visit | Attending: Internal Medicine | Admitting: Internal Medicine

## 2016-11-01 ENCOUNTER — Encounter (HOSPITAL_COMMUNITY): Payer: Self-pay

## 2016-11-01 DIAGNOSIS — C3432 Malignant neoplasm of lower lobe, left bronchus or lung: Secondary | ICD-10-CM | POA: Diagnosis not present

## 2016-11-01 DIAGNOSIS — J449 Chronic obstructive pulmonary disease, unspecified: Secondary | ICD-10-CM

## 2016-11-01 DIAGNOSIS — C3492 Malignant neoplasm of unspecified part of left bronchus or lung: Secondary | ICD-10-CM | POA: Diagnosis not present

## 2016-11-01 LAB — COMPREHENSIVE METABOLIC PANEL
ALT: 14 U/L (ref 0–55)
AST: 22 U/L (ref 5–34)
Albumin: 4 g/dL (ref 3.5–5.0)
Alkaline Phosphatase: 73 U/L (ref 40–150)
Anion Gap: 11 mEq/L (ref 3–11)
BUN: 11.7 mg/dL (ref 7.0–26.0)
CO2: 29 mEq/L (ref 22–29)
Calcium: 10 mg/dL (ref 8.4–10.4)
Chloride: 101 mEq/L (ref 98–109)
Creatinine: 0.8 mg/dL (ref 0.7–1.3)
EGFR: >90 mL/min/{1.73_m2} (ref >90)
Glucose: 106 mg/dl (ref 70–140)
Potassium: 4.7 mEq/L (ref 3.5–5.1)
Sodium: 140 mEq/L (ref 136–145)
Total Bilirubin: 0.69 mg/dL (ref 0.20–1.20)
Total Protein: 7.1 g/dL (ref 6.4–8.3)

## 2016-11-01 LAB — CBC WITH DIFFERENTIAL/PLATELET
BASO%: 0.2 % (ref 0.0–2.0)
Basophils Absolute: 0 10*3/uL (ref 0.0–0.1)
EOS%: 0.9 % (ref 0.0–7.0)
Eosinophils Absolute: 0.1 10*3/uL (ref 0.0–0.5)
HCT: 45.9 % (ref 38.4–49.9)
HGB: 16.2 g/dL (ref 13.0–17.1)
LYMPH%: 20.8 % (ref 14.0–49.0)
MCH: 35.9 pg — ABNORMAL HIGH (ref 27.2–33.4)
MCHC: 35.3 g/dL (ref 32.0–36.0)
MCV: 101.8 fL — ABNORMAL HIGH (ref 79.3–98.0)
MONO#: 0.5 10*3/uL (ref 0.1–0.9)
MONO%: 9.6 % (ref 0.0–14.0)
NEUT#: 3.8 10*3/uL (ref 1.5–6.5)
NEUT%: 68.5 % (ref 39.0–75.0)
Platelets: 132 10*3/uL — ABNORMAL LOW (ref 140–400)
RBC: 4.51 10*6/uL (ref 4.20–5.82)
RDW: 13.7 % (ref 11.0–14.6)
WBC: 5.5 10*3/uL (ref 4.0–10.3)
lymph#: 1.2 10*3/uL (ref 0.9–3.3)

## 2016-11-01 MED ORDER — IOPAMIDOL (ISOVUE-300) INJECTION 61%
75.0000 mL | Freq: Once | INTRAVENOUS | Status: AC | PRN
  Administered 2016-11-01: 75 mL via INTRAVENOUS

## 2016-11-01 MED ORDER — IOPAMIDOL (ISOVUE-300) INJECTION 61%
INTRAVENOUS | Status: AC
  Filled 2016-11-01: qty 75

## 2016-11-08 ENCOUNTER — Encounter: Payer: Self-pay | Admitting: Internal Medicine

## 2016-11-08 ENCOUNTER — Telehealth: Payer: Self-pay | Admitting: Internal Medicine

## 2016-11-08 ENCOUNTER — Ambulatory Visit (HOSPITAL_BASED_OUTPATIENT_CLINIC_OR_DEPARTMENT_OTHER): Payer: BLUE CROSS/BLUE SHIELD | Admitting: Internal Medicine

## 2016-11-08 VITALS — BP 157/65 | HR 71 | Temp 98.6°F | Resp 18 | Ht 71.0 in | Wt 207.4 lb

## 2016-11-08 DIAGNOSIS — C3432 Malignant neoplasm of lower lobe, left bronchus or lung: Secondary | ICD-10-CM

## 2016-11-08 NOTE — Telephone Encounter (Signed)
Gave patient AVS and calender per 4/25 los.

## 2016-11-08 NOTE — Progress Notes (Signed)
[place] [phone]   Fax:(336) [phone]  OFFICE PROGRESS NOTE  [name], MD [place] [address]  DIAGNOSIS: Stage IIIB (T1b, N3, M0) non-small cell lung cancer favoring adenocarcinoma presented with left lower lobe pulmonary nodule in addition to left hilar and bilateral mediastinal lymphadenopathy in addition to right supraclavicular lymph nodes diagnosed in April 2017.  PRIOR THERAPY: 1) Concurrent chemoradiation with weekly carboplatin for AUC of 2 and paclitaxel 45 mg/M2 started on 11/15/2015. He is status post 6 cycles. Last dose was getting 12/20/2015 with partial response. 2) Consolidation chemotherapy with carboplatin for AUC of 5 and paclitaxel 175 MG/M2 every 3 weeks with Neulasta support. Status post 3 cycles.  CURRENT THERAPY: Observation.  INTERVAL HISTORY: Brian White 66 y.o. male returns to the clinic today for follow-up visit accompanied by his wife. The patient is feeling fine today with no specific complaints except for mild cough. He also has some seasonal allergy because of the pollens. He denied having any chest pain, shortness of breath or hemoptysis. He denied having any fever or chills. He has no nausea or vomiting. He had repeat CT scan of the chest performed recently and he is here for evaluation and discussion of his scan results.   MEDICAL HISTORY: Past Medical History:  Diagnosis Date  . Adenocarcinoma, lung ([place]) 10/08/2015   2.4cm spiculated LLL nodule suspicious for primary bronchogenic carcinoma by CT 09/2015 - adenocarcinoma by endobronchial biopsy  . Allergy   . Arthritis   . CAD (coronary artery disease)    mild MI; pt unaware - seen on stress test  . COPD (chronic obstructive pulmonary disease) ([place]) 06/26/2011   Mild centrilobular and paraseptal emphysema by CT 09/2015   . ED (erectile dysfunction)   . H/O anaphylactic shock 2013   hives,hypotension,syncope  . H/O seasonal  allergies   . Herpes zoster 12/06/2015  . Hypertension   . Polycythemia 01/25/2014  . Prostate cancer [facility]) 2013   s/p radiation therapy March 2013, planned f/u with uro after XRT  . Radiation 09/18/2011 through 11/10/2011   Prostate 7600 cGy 40 sessions, seminal vesicles 5600 cGy 40 sessions    . Radiation 11/10/15-12/22/15   chest 60 Gy  . Shortness of breath dyspnea   . Smoker   . Thoracic aorta atherosclerosis ([place]) 10/08/2015   Mild by CT 09/2015     ALLERGIES:  is allergic to onion.  MEDICATIONS:  Current Outpatient Prescriptions  Medication Sig Dispense Refill  . aspirin 81 MG tablet Take 81 mg by mouth daily. Reported on 12/21/2015    . budesonide-formoterol (SYMBICORT) 160-4.5 MCG/ACT inhaler Inhale 1 puff into the lungs 2 (two) times daily. 1 Inhaler 3  . cetirizine (ZYRTEC) 10 MG chewable tablet Chew 10 mg by mouth daily. Reported on 12/20/2015    . EPINEPHrine (EPIPEN) 0.3 mg/0.3 mL DEVI Inject 0.3 mLs (0.3 mg total) into the muscle once. (Patient not taking: Reported on 08/02/2016) 1 Device prn  . ibuprofen (ADVIL,MOTRIN) 200 MG tablet Take 600 mg by mouth every 6 (six) hours as needed (Pain). Reported on 12/21/2015    . losartan-hydrochlorothiazide (HYZAAR) 100-25 MG tablet Take 1 tablet by mouth daily. 90 tablet 3  . oxyCODONE-acetaminophen (PERCOCET/ROXICET) 5-325 MG tablet Take 1 tablet by mouth every 4 (four) hours as needed for severe pain. (Patient not taking: Reported on 08/02/2016) 30 tablet 0  . prochlorperazine (COMPAZINE) 10 MG tablet Take 1 tablet (10 mg total) by mouth every  6 (six) hours as needed for nausea or vomiting. (Patient not taking: Reported on 08/02/2016) 30 tablet 0  . sildenafil (VIAGRA) 100 MG tablet Take 0.5-1 tablets (50-100 mg total) by mouth daily as needed for erectile dysfunction. 4 tablet 0   No current facility-administered medications for this visit.     SURGICAL HISTORY:  Past Surgical History:  Procedure Laterality Date  .  COLONOSCOPY W/ BIOPSIES AND POLYPECTOMY  10/11/15   mult TAs, 3cm rectal polypoid lesion; rec further treatment, diverticulosis ([place])  . ENDOBRONCHIAL ULTRASOUND Bilateral 10/18/2015   Procedure: ENDOBRONCHIAL ULTRASOUND;  Surgeon: Javier Glazier, MD;  Location: [facility];  Service: Cardiopulmonary;  Laterality: Bilateral;  . HERNIA REPAIR Right 2005   with mesh  . INSERTION PROSTATE RADIATION SEED  08/2011   prostate cancer, [place]    REVIEW OF SYSTEMS:  A comprehensive review of systems was negative except for: Respiratory: positive for cough   PHYSICAL EXAMINATION: General appearance: alert, cooperative and no distress Head: Normocephalic, without obvious abnormality, atraumatic Neck: no adenopathy, no JVD, supple, symmetrical, trachea midline and thyroid not enlarged, symmetric, no tenderness/mass/nodules Lymph nodes: Cervical, supraclavicular, and axillary nodes normal. Resp: clear to auscultation bilaterally Back: symmetric, no curvature. ROM normal. No CVA tenderness. Cardio: regular rate and rhythm, S1, S2 normal, no murmur, click, rub or gallop GI: soft, non-tender; bowel sounds normal; no masses,  no organomegaly Extremities: extremities normal, atraumatic, no cyanosis or edema  ECOG PERFORMANCE STATUS: 1 - Symptomatic but completely ambulatory  Blood pressure (!) 157/65, pulse 71, temperature 98.6 F (37 C), temperature source Oral, resp. rate 18, height '5\' 11"'$  (1.803 m), weight 207 lb 6.4 oz (94.1 kg), SpO2 100 %.  LABORATORY DATA: Lab Results  Component Value Date   WBC 5.5 11/01/2016   HGB 16.2 11/01/2016   HCT 45.9 11/01/2016   MCV 101.8 (H) 11/01/2016   PLT 132 (L) 11/01/2016      Chemistry      Component Value Date/Time   NA 140 11/01/2016 0858   K 4.7 11/01/2016 0858   CL 99 08/27/2015 0946   CO2 29 11/01/2016 0858   BUN 11.7 11/01/2016 0858   CREATININE 0.8 11/01/2016 0858      Component Value Date/Time   CALCIUM 10.0 11/01/2016 0858    ALKPHOS 73 11/01/2016 0858   AST 22 11/01/2016 0858   ALT 14 11/01/2016 0858   BILITOT 0.69 11/01/2016 0858       RADIOGRAPHIC STUDIES: Ct Chest W Contrast  Result Date: 11/01/2016 CLINICAL DATA:  Left lung cancer restaging. EXAM: CT CHEST WITH CONTRAST TECHNIQUE: Multidetector CT imaging of the chest was performed during intravenous contrast administration. CONTRAST:  34m ISOVUE-300 IOPAMIDOL (ISOVUE-300) INJECTION 61% COMPARISON:  07/25/2016 FINDINGS: Cardiovascular: Heart size upper normal. No pericardial effusion. Coronary artery calcification is noted. Atherosclerotic calcification is noted in the wall of the thoracic aorta. Mediastinum/Nodes: No mediastinal lymphadenopathy. Stable appearance 9 mm short axis AP window lymph node. There is no hilar lymphadenopathy. The esophagus has normal imaging features. There is no axillary lymphadenopathy. Lungs/Pleura: No suspicious pulmonary nodule or mass in the right lung. No right lung airspace consolidation there is bronchial wall thickening in the right middle and lower lobes. Left lung demonstrates debris in the left lower lobe bronchus with significant bronchial wall thickening, as before. Architectural distortion/ scarring in the left lower lobe is stable. Upper Abdomen: Unremarkable. Musculoskeletal: Bone windows reveal no worrisome lytic or sclerotic osseous lesions. IMPRESSION: 1. Since the prior study, there is some new  debris in the left lower lobe bronchus without postobstructive parenchymal disease evident. Bronchial wall thickening in the airways to the posterior left lower lobe is stable. The parenchymal distortion/ scarring in the central left lower lobe is unchanged. 2. No progressive left lower lobe nodularity. No evidence for progressive mediastinal lymphadenopathy. Electronically Signed   By: Misty Stanley M.D.   On: 11/01/2016 13:00    ASSESSMENT AND PLAN:  This is a very pleasant 66 years old white male with a stage IIIB non-small  cell lung cancer favoring adenocarcinoma status post concurrent chemoradiation followed by consolidation chemotherapy. The patient is currently on observation and doing fine. There is no clear evidence for disease progression and the recent CT scan of the chest. I discussed the scan results with the patient and his wife today. I recommended for him to continue on observation with repeat CT scan of the chest in 3 months. He was advised to call immediately if he has any concerning symptoms in the interval. The patient voices understanding of current disease status and treatment options and is in agreement with the current care plan. All questions were answered. The patient knows to call the clinic with any problems, questions or concerns. We can certainly see the patient much sooner if necessary. I spent 10 minutes counseling the patient face to face. The total time spent in the appointment was 15 minutes.  Disclaimer: This note was dictated with voice recognition software. Similar sounding words can inadvertently be transcribed and may not be corrected upon review.

## 2017-01-25 ENCOUNTER — Encounter: Payer: Self-pay | Admitting: Internal Medicine

## 2017-02-03 ENCOUNTER — Telehealth: Payer: Self-pay

## 2017-02-03 NOTE — Telephone Encounter (Signed)
spoke with patient and he is aware of his new appt as dr Julien Nordmann on cme and he will see lisa

## 2017-02-05 ENCOUNTER — Encounter: Payer: BLUE CROSS/BLUE SHIELD | Admitting: Family Medicine

## 2017-02-05 ENCOUNTER — Telehealth: Payer: Self-pay | Admitting: Family Medicine

## 2017-02-05 MED ORDER — LOSARTAN POTASSIUM-HCTZ 100-25 MG PO TABS: 1.0000 | ORAL_TABLET | Freq: Every day | ORAL | 0 refills | Status: DC

## 2017-02-05 NOTE — Telephone Encounter (Signed)
Rx for losartan-hctz sent to [facility].

## 2017-02-05 NOTE — Telephone Encounter (Signed)
Pt out of BP rx. Requesting refill is called into [facility] on [address] [place]. Took last pill this morning.

## 2017-02-06 ENCOUNTER — Ambulatory Visit (HOSPITAL_COMMUNITY)
Admission: RE | Admit: 2017-02-06 | Discharge: 2017-02-06 | Disposition: A | Discharge status: Home / Self Care | Payer: BLUE CROSS/BLUE SHIELD | Source: Ambulatory Visit | Attending: Internal Medicine | Admitting: Internal Medicine

## 2017-02-06 ENCOUNTER — Other Ambulatory Visit (HOSPITAL_BASED_OUTPATIENT_CLINIC_OR_DEPARTMENT_OTHER): Payer: BLUE CROSS/BLUE SHIELD

## 2017-02-06 ENCOUNTER — Encounter: Payer: BLUE CROSS/BLUE SHIELD | Admitting: Family Medicine

## 2017-02-06 DIAGNOSIS — I2510 Atherosclerotic heart disease of native coronary artery without angina pectoris: Secondary | ICD-10-CM | POA: Insufficient documentation

## 2017-02-06 DIAGNOSIS — I700 Atherosclerosis of aorta: Secondary | ICD-10-CM | POA: Diagnosis not present

## 2017-02-06 DIAGNOSIS — C3432 Malignant neoplasm of lower lobe, left bronchus or lung: Secondary | ICD-10-CM

## 2017-02-06 DIAGNOSIS — C3490 Malignant neoplasm of unspecified part of unspecified bronchus or lung: Secondary | ICD-10-CM | POA: Diagnosis not present

## 2017-02-06 LAB — CBC WITH DIFFERENTIAL/PLATELET
BASO%: 0.5 % (ref 0.0–2.0)
Basophils Absolute: 0 10*3/uL (ref 0.0–0.1)
EOS%: 0.8 % (ref 0.0–7.0)
Eosinophils Absolute: 0 10*3/uL (ref 0.0–0.5)
HCT: 49.7 % (ref 38.4–49.9)
HGB: 17 g/dL (ref 13.0–17.1)
LYMPH%: 21.3 % (ref 14.0–49.0)
MCH: 36.2 pg — ABNORMAL HIGH (ref 27.2–33.4)
MCHC: 34.2 g/dL (ref 32.0–36.0)
MCV: 106 fL — ABNORMAL HIGH (ref 79.3–98.0)
MONO#: 0.4 10*3/uL (ref 0.1–0.9)
MONO%: 10.3 % (ref 0.0–14.0)
NEUT#: 2.6 10*3/uL (ref 1.5–6.5)
NEUT%: 67.1 % (ref 39.0–75.0)
Platelets: 132 10*3/uL — ABNORMAL LOW (ref 140–400)
RBC: 4.69 10*6/uL (ref 4.20–5.82)
RDW: 13.9 % (ref 11.0–14.6)
WBC: 3.9 10*3/uL — ABNORMAL LOW (ref 4.0–10.3)
lymph#: 0.8 10*3/uL — ABNORMAL LOW (ref 0.9–3.3)

## 2017-02-06 LAB — COMPREHENSIVE METABOLIC PANEL
ALT: 18 U/L (ref 0–55)
AST: 21 U/L (ref 5–34)
Albumin: 4.2 g/dL (ref 3.5–5.0)
Alkaline Phosphatase: 89 U/L (ref 40–150)
Anion Gap: 12 mEq/L — ABNORMAL HIGH (ref 3–11)
BUN: 9.7 mg/dL (ref 7.0–26.0)
CO2: 27 mEq/L (ref 22–29)
Calcium: 9.8 mg/dL (ref 8.4–10.4)
Chloride: 101 mEq/L (ref 98–109)
Creatinine: 0.8 mg/dL (ref 0.7–1.3)
EGFR: >90 mL/min/{1.73_m2} (ref >90)
Glucose: 84 mg/dl (ref 70–140)
Potassium: 4 mEq/L (ref 3.5–5.1)
Sodium: 140 mEq/L (ref 136–145)
Total Bilirubin: 0.82 mg/dL (ref 0.20–1.20)
Total Protein: 7.6 g/dL (ref 6.4–8.3)

## 2017-02-06 MED ORDER — IOPAMIDOL (ISOVUE-300) INJECTION 61%
75.0000 mL | Freq: Once | INTRAVENOUS | Status: AC | PRN
  Administered 2017-02-06: 75 mL via INTRAVENOUS

## 2017-02-06 MED ORDER — IOPAMIDOL (ISOVUE-300) INJECTION 61%
INTRAVENOUS | Status: AC
  Filled 2017-02-06: qty 75

## 2017-02-13 ENCOUNTER — Ambulatory Visit: Payer: BLUE CROSS/BLUE SHIELD | Admitting: Internal Medicine

## 2017-02-14 ENCOUNTER — Encounter: Payer: BLUE CROSS/BLUE SHIELD | Admitting: Family Medicine

## 2017-02-19 ENCOUNTER — Encounter: Payer: Self-pay | Admitting: Oncology

## 2017-02-19 ENCOUNTER — Ambulatory Visit (HOSPITAL_BASED_OUTPATIENT_CLINIC_OR_DEPARTMENT_OTHER): Payer: BLUE CROSS/BLUE SHIELD | Admitting: Oncology

## 2017-02-19 VITALS — BP 131/67 | HR 58 | Temp 98.3°F | Resp 18 | Ht 71.0 in | Wt 205.6 lb

## 2017-02-19 DIAGNOSIS — C3432 Malignant neoplasm of lower lobe, left bronchus or lung: Secondary | ICD-10-CM | POA: Diagnosis not present

## 2017-02-19 DIAGNOSIS — R05 Cough: Secondary | ICD-10-CM | POA: Diagnosis not present

## 2017-02-19 NOTE — Progress Notes (Signed)
[place] Telephone:([phone]   Fax:(336) [phone]  OFFICE PROGRESS NOTE  [name], MD [place] [address]  DIAGNOSIS: Stage IIIB (T1b, N3, M0) non-small cell lung cancer favoring adenocarcinoma presented with left lower lobe pulmonary nodule in addition to left hilar and bilateral mediastinal lymphadenopathy in addition to right supraclavicular lymph nodes diagnosed in April 2017.  PRIOR THERAPY: 1) Concurrent chemoradiation with weekly carboplatin for AUC of 2 and paclitaxel 45 mg/M2 started on 11/15/2015. He is status post 6 cycles. Last dose was getting 12/20/2015 with partial response. 2) Consolidation chemotherapy with carboplatin for AUC of 5 and paclitaxel 175 MG/M2 every 3 weeks with Neulasta support. Status post 3 cycles.  CURRENT THERAPY: Observation.  INTERVAL HISTORY: Brian White 66 y.o. male returns to the clinic today for follow-up visit accompanied by his wife. The patient is feeling fine today with no specific complaints except for mild cough. He denied having any chest pain, shortness of breath or hemoptysis. He denied having any fever or chills. He has no nausea or vomiting. He had repeat CT scan of the chest performed recently and he is here for evaluation and discussion of his scan results.   MEDICAL HISTORY: Past Medical History:  Diagnosis Date  . Adenocarcinoma, lung ([place]) 10/08/2015   2.4cm spiculated LLL nodule suspicious for primary bronchogenic carcinoma by CT 09/2015 - adenocarcinoma by endobronchial biopsy  . Allergy   . Arthritis   . CAD (coronary artery disease)    mild MI; pt unaware - seen on stress test  . COPD (chronic obstructive pulmonary disease) ([place]) 06/26/2011   Mild centrilobular and paraseptal emphysema by CT 09/2015   . ED (erectile dysfunction)   . H/O anaphylactic shock 2013   hives,hypotension,syncope  . H/O seasonal allergies   . Herpes zoster 12/06/2015  . Hypertension   .  Polycythemia 01/25/2014  . Prostate cancer [facility]) 2013   s/p radiation therapy March 2013, planned f/u with uro after XRT  . Radiation 09/18/2011 through 11/10/2011   Prostate 7600 cGy 40 sessions, seminal vesicles 5600 cGy 40 sessions    . Radiation 11/10/15-12/22/15   chest 60 Gy  . Shortness of breath dyspnea   . Smoker   . Thoracic aorta atherosclerosis ([place]) 10/08/2015   Mild by CT 09/2015     ALLERGIES:  is allergic to onion.  MEDICATIONS:  Current Outpatient Prescriptions  Medication Sig Dispense Refill  . aspirin 81 MG tablet Take 81 mg by mouth daily. Reported on 12/21/2015    . budesonide-formoterol (SYMBICORT) 160-4.5 MCG/ACT inhaler Inhale 1 puff into the lungs 2 (two) times daily. 1 Inhaler 3  . cetirizine (ZYRTEC) 10 MG chewable tablet Chew 10 mg by mouth daily.    Marland Kitchen ibuprofen (ADVIL,MOTRIN) 200 MG tablet Take 600 mg by mouth every 6 (six) hours as needed (Pain). Reported on 12/21/2015    . losartan-hydrochlorothiazide (HYZAAR) 100-25 MG tablet Take 1 tablet by mouth daily. 30 tablet 0  . sildenafil (VIAGRA) 100 MG tablet Take 0.5-1 tablets (50-100 mg total) by mouth daily as needed for erectile dysfunction. 4 tablet 0  . EPINEPHrine (EPIPEN) 0.3 mg/0.3 mL DEVI Inject 0.3 mLs (0.3 mg total) into the muscle once. (Patient not taking: Reported on 08/02/2016) 1 Device prn  . prochlorperazine (COMPAZINE) 10 MG tablet Take 1 tablet (10 mg total) by mouth every 6 (six) hours as needed for nausea or vomiting. (Patient not taking: Reported on 08/02/2016) 30 tablet 0  No current facility-administered medications for this visit.     SURGICAL HISTORY:  Past Surgical History:  Procedure Laterality Date  . COLONOSCOPY W/ BIOPSIES AND POLYPECTOMY  10/11/15   mult TAs, 3cm rectal polypoid lesion; rec further treatment, diverticulosis ([place])  . ENDOBRONCHIAL ULTRASOUND Bilateral 10/18/2015   Procedure: ENDOBRONCHIAL ULTRASOUND;  Surgeon: Javier Glazier, MD;  Location:  [facility] END[facility];  Service: Cardiopulmonary;  Laterality: Bilateral;  . HERNIA REPAIR Right 2005   with mesh  . INSERTION PROSTATE RADIATION SEED  08/2011   prostate cancer, [place]    REVIEW OF SYSTEMS:  A comprehensive review of systems was negative except for: Respiratory: positive for cough   PHYSICAL EXAMINATION: General appearance: alert, cooperative and no distress Head: Normocephalic, without obvious abnormality, atraumatic Neck: no adenopathy, no JVD, supple, symmetrical, trachea midline and thyroid not enlarged, symmetric, no tenderness/mass/nodules Lymph nodes: Cervical, supraclavicular, and axillary nodes normal. Resp: clear to auscultation bilaterally Back: symmetric, no curvature. ROM normal. No CVA tenderness. Cardio: regular rate and rhythm, S1, S2 normal, no murmur, click, rub or gallop GI: soft, non-tender; bowel sounds normal; no masses,  no organomegaly Extremities: extremities normal, atraumatic, no cyanosis or edema  ECOG PERFORMANCE STATUS: 1 - Symptomatic but completely ambulatory  Blood pressure 131/67, pulse (!) 58, temperature 98.3 F (36.8 C), temperature source Oral, resp. rate 18, height 5\' 11"  (1.803 m), weight 205 lb 9.6 oz (93.3 kg), SpO2 98 %.  LABORATORY DATA: Lab Results  Component Value Date   WBC 3.9 (L) 02/06/2017   HGB 17.0 02/06/2017   HCT 49.7 02/06/2017   MCV 106.0 (H) 02/06/2017   PLT 132 (L) 02/06/2017      Chemistry      Component Value Date/Time   NA 140 02/06/2017 0915   K 4.0 02/06/2017 0915   CL 99 08/27/2015 0946   CO2 27 02/06/2017 0915   BUN 9.7 02/06/2017 0915   CREATININE 0.8 02/06/2017 0915      Component Value Date/Time   CALCIUM 9.8 02/06/2017 0915   ALKPHOS 89 02/06/2017 0915   AST 21 02/06/2017 0915   ALT 18 02/06/2017 0915   BILITOT 0.82 02/06/2017 0915       RADIOGRAPHIC STUDIES: Ct Chest W Contrast  Result Date: 02/06/2017 CLINICAL DATA:  Restaging lung cancer. EXAM: CT CHEST WITH CONTRAST TECHNIQUE:  Multidetector CT imaging of the chest was performed during intravenous contrast administration. CONTRAST:  21mL ISOVUE-300 IOPAMIDOL (ISOVUE-300) INJECTION 61% COMPARISON:  11/01/2016 FINDINGS: Cardiovascular: The heart size appears normal. No pericardial effusion identified. Aortic atherosclerosis noted. Calcifications within the LAD and RCA coronary artery noted. Mediastinum/Nodes: The trachea appears patent and is midline. Normal appearance of the esophagus. No mediastinal or hilar adenopathy. No axillary or supraclavicular adenopathy. Lungs/Pleura: No pleural effusion. Left lower lobe perihilar nodularity is identified measuring 1.9 x 1.3 cm, image 91 of series 2. On the previous exam this measured 1.4 x 1.0 cm. Mild postobstructive pneumonitis is identified. No new or enlarging pulmonary nodules. Upper Abdomen: No acute abnormality. Musculoskeletal: There is degenerative disc disease identified within the thoracic spine. No aggressive lytic or sclerotic bone lesions. IMPRESSION: 1. Increase in size of left lower lobe perihilar nodularity worrisome for tumor recurrence. Consider further evaluation with PET-CT. 2.  Aortic atherosclerosis and coronary artery calcification. Electronically Signed   By: Kerby Moors M.D.   On: 02/06/2017 16:36    ASSESSMENT AND PLAN:  This is a very pleasant 66 years old white male with a stage IIIB non-small cell lung cancer  favoring adenocarcinoma status post concurrent chemoradiation followed by consolidation chemotherapy. The patient is currently on observation and doing fine.   The patient was seen with Dr. Julien Nordmann. CT scan results were discussed with the patient and his wife today. There is an area in the left lower lobe that is larger on the most recent CT compared with the prior CT scan. Discussed with the patient that this may represent treatment effect versus recurrence of his lung cancer. Recommend that he proceed with a PET scan in the next 2-3 weeks for  further evaluation of this area.  The patient will be seen back after the PET scan to discuss the results and to determine further treatment plan.  The patient voices understanding of current disease status and treatment options and is in agreement with the current care plan. All questions were answered. The patient knows to call the clinic with any problems, questions or concerns. We can certainly see the patient much sooner if necessary.   Mikey Bussing, DNP, AGPCNP-BC, AOCNP  ADDENDUM: Hematology/Oncology Attending: I had a face to face encounter with the patient today. I recommended his care plan. This is a very pleasant 66 years old white male with history of stage IIIB non-small cell lung cancer favoring adenocarcinoma. He is status post a course of concurrent chemoradiation followed by consolidation chemotherapy and has been on observation for the last few months. The patient had repeat CT scan of the chest performed recently that showed increase in size of left lower lobe perihilar nodularity suspicious for disease recurrence. I discussed the scan results with the patient and his wife. I recommend for him to have a PET scan for further evaluation of this lesion and to rule out disease recurrence before considering the patient for additional systemic therapy. I will see him back for follow-up visit in 2-3 weeks depending on the scan results. He was advised to call immediately if he has any concerning symptoms in the interval.  Disclaimer: This note was dictated with voice recognition software. Similar sounding words can inadvertently be transcribed and may be missed upon review. Eilleen Kempf., MD 02/19/17

## 2017-02-21 ENCOUNTER — Encounter: Payer: BLUE CROSS/BLUE SHIELD | Admitting: Family Medicine

## 2017-02-26 NOTE — Telephone Encounter (Signed)
Spoke with patient concerning his appointment for 8/28. And also informed him of appointment missed for PET, and gave him the phone number to reschedule.

## 2017-02-28 ENCOUNTER — Encounter: Payer: Self-pay | Admitting: Family Medicine

## 2017-02-28 ENCOUNTER — Ambulatory Visit (INDEPENDENT_AMBULATORY_CARE_PROVIDER_SITE_OTHER): Payer: BLUE CROSS/BLUE SHIELD | Admitting: Family Medicine

## 2017-02-28 VITALS — BP 116/70 | HR 66 | Temp 97.9°F | Ht 69.5 in | Wt 207.0 lb

## 2017-02-28 DIAGNOSIS — Z0000 Encounter for general adult medical examination without abnormal findings: Secondary | ICD-10-CM

## 2017-02-28 DIAGNOSIS — B029 Zoster without complications: Secondary | ICD-10-CM | POA: Diagnosis not present

## 2017-02-28 DIAGNOSIS — J449 Chronic obstructive pulmonary disease, unspecified: Secondary | ICD-10-CM | POA: Diagnosis not present

## 2017-02-28 DIAGNOSIS — I10 Essential (primary) hypertension: Secondary | ICD-10-CM | POA: Diagnosis not present

## 2017-02-28 DIAGNOSIS — C3432 Malignant neoplasm of lower lobe, left bronchus or lung: Secondary | ICD-10-CM | POA: Diagnosis not present

## 2017-02-28 DIAGNOSIS — K621 Rectal polyp: Secondary | ICD-10-CM

## 2017-02-28 DIAGNOSIS — C61 Malignant neoplasm of prostate: Secondary | ICD-10-CM | POA: Diagnosis not present

## 2017-02-28 DIAGNOSIS — E785 Hyperlipidemia, unspecified: Secondary | ICD-10-CM | POA: Diagnosis not present

## 2017-02-28 DIAGNOSIS — F17200 Nicotine dependence, unspecified, uncomplicated: Secondary | ICD-10-CM

## 2017-02-28 DIAGNOSIS — I700 Atherosclerosis of aorta: Secondary | ICD-10-CM

## 2017-02-28 DIAGNOSIS — Z7189 Other specified counseling: Secondary | ICD-10-CM | POA: Diagnosis not present

## 2017-02-28 DIAGNOSIS — I2510 Atherosclerotic heart disease of native coronary artery without angina pectoris: Secondary | ICD-10-CM | POA: Diagnosis not present

## 2017-02-28 MED ORDER — LOSARTAN POTASSIUM-HCTZ 100-25 MG PO TABS: 1.0000 | ORAL_TABLET | Freq: Every day | ORAL | 3 refills | Status: DC

## 2017-02-28 MED ORDER — BUDESONIDE-FORMOTEROL FUMARATE 160-4.5 MCG/ACT IN AERO: 1.0000 | INHALATION_SPRAY | Freq: Two times a day (BID) | RESPIRATORY_TRACT | 3 refills | Status: AC

## 2017-02-28 NOTE — Assessment & Plan Note (Signed)
Appreciate onc care - pending f/u in next few weeks after PET scan.

## 2017-02-28 NOTE — Assessment & Plan Note (Signed)
Advanced directive: He does not have set up but would want wife to be HCPOA.

## 2017-02-28 NOTE — Assessment & Plan Note (Signed)
H/o this. Declines shingles vaccine.

## 2017-02-28 NOTE — Assessment & Plan Note (Signed)
3cm TVA on colonoscopy 09/2015 - never returned for further treatment because of concomitant lung cancer s/p chemo last year [employer]). Discussed this, pt desires to await PET results looking for metabolic activity in colon.

## 2017-02-28 NOTE — Assessment & Plan Note (Signed)
Continue to encourage cessation. Down to 1/2 ppd.

## 2017-02-28 NOTE — Patient Instructions (Addendum)
We will hold off on further evaluation of polyp lesion found on last year's colonoscopy until we find out results of PET scan next week. If everything's fine, call me and we will get you set up with GI docs again.  Think about pneumonia shots and shingles shot.  Increase water intake.  Labs today. Return as needed or in 1 year for next physical.  Health Maintenance, Male A healthy lifestyle and preventive care is important for your health and wellness. Ask your health care provider about what schedule of regular examinations is right for you. What should I know about weight and diet? Eat a Healthy Diet  Eat plenty of vegetables, fruits, whole grains, low-fat dairy products, and lean protein.  Do not eat a lot of foods high in solid fats, added sugars, or salt.  Maintain a Healthy Weight Regular exercise can help you achieve or maintain a healthy weight. You should:  Do at least 150 minutes of exercise each week. The exercise should increase your heart rate and make you sweat (moderate-intensity exercise).  Do strength-training exercises at least twice a week.  Watch Your Levels of Cholesterol and Blood Lipids  Have your blood tested for lipids and cholesterol every 5 years starting at 65 years of age. If you are at high risk for heart disease, you should start having your blood tested when you are 65 years old. You may need to have your cholesterol levels checked more often if: ? Your lipid or cholesterol levels are high. ? You are older than 65 years of age. ? You are at high risk for heart disease.  What should I know about cancer screening? Many types of cancers can be detected early and may often be prevented. Lung Cancer  You should be screened every year for lung cancer if: ? You are a current smoker who has smoked for at least 30 years. ? You are a former smoker who has quit within the past 15 years.  Talk to your health care provider about your screening options, when you  should start screening, and how often you should be screened.  Colorectal Cancer  Routine colorectal cancer screening usually begins at 65 years of age and should be repeated every 5-10 years until you are 65 years old. You may need to be screened more often if early forms of precancerous polyps or small growths are found. Your health care provider may recommend screening at an earlier age if you have risk factors for colon cancer.  Your health care provider may recommend using home test kits to check for hidden blood in the stool.  A small camera at the end of a tube can be used to examine your colon (sigmoidoscopy or colonoscopy). This checks for the earliest forms of colorectal cancer.  Prostate and Testicular Cancer  Depending on your age and overall health, your health care provider may do certain tests to screen for prostate and testicular cancer.  Talk to your health care provider about any symptoms or concerns you have about testicular or prostate cancer.  Skin Cancer  Check your skin from head to toe regularly.  Tell your health care provider about any new moles or changes in moles, especially if: ? There is a change in a mole's size, shape, or color. ? You have a mole that is larger than a pencil eraser.  Always use sunscreen. Apply sunscreen liberally and repeat throughout the day.  Protect yourself by wearing long sleeves, pants, a wide-brimmed hat, and  sunglasses when outside.  What should I know about heart disease, diabetes, and high blood pressure?  If you are 48-50 years of age, have your blood pressure checked every 3-5 years. If you are 106 years of age or older, have your blood pressure checked every year. You should have your blood pressure measured twice-once when you are at a hospital or clinic, and once when you are not at a hospital or clinic. Record the average of the two measurements. To check your blood pressure when you are not at a hospital or clinic, you  can use: ? An automated blood pressure machine at a pharmacy. ? A home blood pressure monitor.  Talk to your health care provider about your target blood pressure.  If you are between 68-87 years old, ask your health care provider if you should take aspirin to prevent heart disease.  Have regular diabetes screenings by checking your fasting blood sugar level. ? If you are at a normal weight and have a low risk for diabetes, have this test once every three years after the age of 42. ? If you are overweight and have a high risk for diabetes, consider being tested at a younger age or more often.  A one-time screening for abdominal aortic aneurysm (AAA) by ultrasound is recommended for men aged 81-75 years who are current or former smokers. What should I know about preventing infection? Hepatitis B If you have a higher risk for hepatitis B, you should be screened for this virus. Talk with your health care provider to find out if you are at risk for hepatitis B infection. Hepatitis C Blood testing is recommended for:  Everyone born from [birth year] through 1964.  Anyone with known risk factors for hepatitis C.  Sexually Transmitted Diseases (STDs)  You should be screened each year for STDs including gonorrhea and chlamydia if: ? You are sexually active and are younger than 65 years of age. ? You are older than 65 years of age and your health care provider tells you that you are at risk for this type of infection. ? Your sexual activity has changed since you were last screened and you are at an increased risk for chlamydia or gonorrhea. Ask your health care provider if you are at risk.  Talk with your health care provider about whether you are at high risk of being infected with HIV. Your health care provider may recommend a prescription medicine to help prevent HIV infection.  What else can I do?  Schedule regular health, dental, and eye exams.  Stay current with your vaccines  (immunizations).  Do not use any tobacco products, such as cigarettes, chewing tobacco, and e-cigarettes. If you need help quitting, ask your health care provider.  Limit alcohol intake to no more than 2 drinks per day. One drink equals 12 ounces of beer, 5 ounces of wine, or 1 ounces of hard liquor.  Do not use street drugs.  Do not share needles.  Ask your health care provider for help if you need support or information about quitting drugs.  Tell your health care provider if you often feel depressed.  Tell your health care provider if you have ever been abused or do not feel safe at home. This information is not intended to replace advice given to you by your health care provider. Make sure you discuss any questions you have with your health care provider. Document Released: 12/30/2007 Document Revised: 03/01/2016 Document Reviewed: 04/06/2015 Elsevier Interactive Patient Education  2018  [employer].

## 2017-02-28 NOTE — Assessment & Plan Note (Signed)
Continue aspirin. Hold statin pending upcoming PET results.

## 2017-02-28 NOTE — Assessment & Plan Note (Signed)
Continue aspirin 

## 2017-02-28 NOTE — Assessment & Plan Note (Signed)
Preventative protocols reviewed and updated unless pt declined. Discussed healthy diet and lifestyle.  

## 2017-02-28 NOTE — Assessment & Plan Note (Signed)
Chronic, stable continue current regimen.

## 2017-02-28 NOTE — Assessment & Plan Note (Signed)
Check dLDL today. Not on statin.

## 2017-02-28 NOTE — Assessment & Plan Note (Signed)
Declined return to urology. Check PSA. Declines DRE today.

## 2017-02-28 NOTE — Progress Notes (Signed)
BP 116/70   Pulse 66   Temp 97.9 F (36.6 C) (Oral)   Ht 5' 9.5" (1.765 m)   Wt 207 lb (93.9 kg)   SpO2 96%   BMI 30.13 kg/m    CC: CPE Subjective:    Patient ID: Brian White, male    DOB: 1951/01/03, 66 y.o.   MRN: [id number]  HPI: Brian White is a 66 y.o. male presenting on 02/28/2017 for Annual Exam   Appt cancelled last week as Epic across system was down.  Seeing oncology for Stage IIIB non small cell lung cancer s/p chemotherapy, diagnosed 10/2015 by lung cancer screening CT. Area of concern LLL planning PET scan for further evaluation.   Ongoing smoker - has not used symbicort in last 6 months. He couldn't tell any difference. He does use mucinex 2 tab bid regularly.   Preventative: Colonoscopy 09/2015 - mult TAs, 3cm rectal polypoid lesion, rec further treatment, diverticulosis ([place]) - never f/u because around that time he started chemo. Plan was for endoscopic polypectomy - never completed.  H/o prostate cancer - saw Dr. Gaynelle Arabian. S/p radiation only, denies seeds. Last saw 2013. Plan was to return to urology but declines return. Requests we continue checking PSA here but declines DRE.  Lung cancer screening: positive - see above.  Flu shot encouraged he get one this year Td ~2009 Pneumovax - 11/2012. Declines other pneumococcal vaccines today. shingrix - declines.  Advanced directive: discussed with patient. He does not have set up but would want wife to be HCPOA.  Seat belt use discussed Sunscreen use discussed. No changing moles on skin.  Ongoing smoker - down to 1/2 ppd  Alcohol - 3-4 drinks/day in evenings. Beer or mixed drink. Never withdrawals.   Lives with wife, dogs, chickens and goats  Occupation: retired. 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    Relevant past medical, surgical, family and social history reviewed and updated  as indicated. Interim medical history since our last visit reviewed. Allergies and medications reviewed and updated. Outpatient Medications Prior to Visit  Medication Sig Dispense Refill  . aspirin 81 MG tablet Take 81 mg by mouth daily. Reported on 12/21/2015    . cetirizine (ZYRTEC) 10 MG chewable tablet Chew 10 mg by mouth daily.    Marland Kitchen EPINEPHrine (EPIPEN) 0.3 mg/0.3 mL DEVI Inject 0.3 mLs (0.3 mg total) into the muscle once. 1 Device prn  . ibuprofen (ADVIL,MOTRIN) 200 MG tablet Take 600 mg by mouth every 6 (six) hours as needed (Pain). Reported on 12/21/2015    . sildenafil (VIAGRA) 100 MG tablet Take 0.5-1 tablets (50-100 mg total) by mouth daily as needed for erectile dysfunction. 4 tablet 0  . budesonide-formoterol (SYMBICORT) 160-4.5 MCG/ACT inhaler Inhale 1 puff into the lungs 2 (two) times daily. 1 Inhaler 3  . losartan-hydrochlorothiazide (HYZAAR) 100-25 MG tablet Take 1 tablet by mouth daily. 30 tablet 0  . prochlorperazine (COMPAZINE) 10 MG tablet Take 1 tablet (10 mg total) by mouth every 6 (six) hours as needed for nausea or vomiting. 30 tablet 0   No facility-administered medications prior to visit.      Per HPI unless specifically indicated in ROS section below Review of Systems  Constitutional: Negative for activity change, appetite change, chills, fatigue, fever and unexpected weight change.  HENT: Negative for hearing loss.   Eyes: Negative for visual disturbance.  Respiratory: Positive  for shortness of breath. Negative for cough (mild chronic), chest tightness and wheezing.   Cardiovascular: Negative for chest pain, palpitations and leg swelling.  Gastrointestinal: Negative for abdominal distention, abdominal pain, blood in stool, constipation, diarrhea, nausea and vomiting.  Genitourinary: Negative for difficulty urinating and hematuria.  Musculoskeletal: Negative for arthralgias, myalgias and neck pain.  Skin: Negative for rash.  Neurological: Negative for dizziness,  seizures, syncope and headaches.  Hematological: Negative for adenopathy. Does not bruise/bleed easily.  Psychiatric/Behavioral: Negative for dysphoric mood. The patient is not nervous/anxious.        Objective:    BP 116/70   Pulse 66   Temp 97.9 F (36.6 C) (Oral)   Ht 5' 9.5" (1.765 m)   Wt 207 lb (93.9 kg)   SpO2 96%   BMI 30.13 kg/m   Wt Readings from Last 3 Encounters:  02/28/17 207 lb (93.9 kg)  02/19/17 205 lb 9.6 oz (93.3 kg)  11/08/16 207 lb 6.4 oz (94.1 kg)    Physical Exam  Constitutional: He is oriented to person, place, and time. He appears well-developed and well-nourished. No distress.  HENT:  Head: Normocephalic and atraumatic.  Right Ear: Hearing, tympanic membrane, external ear and ear canal normal.  Left Ear: Hearing, tympanic membrane, external ear and ear canal normal.  Nose: Nose normal.  Mouth/Throat: Uvula is midline, oropharynx is clear and moist and mucous membranes are normal. No oropharyngeal exudate, posterior oropharyngeal edema or posterior oropharyngeal erythema.  Eyes: Pupils are equal, round, and reactive to light. Conjunctivae and EOM are normal. No scleral icterus.  Neck: Normal range of motion. Neck supple. Carotid bruit is not present. No thyromegaly present.  Cardiovascular: Normal rate, regular rhythm, normal heart sounds and intact distal pulses.   No murmur heard. Pulses:      Radial pulses are 2+ on the right side, and 2+ on the left side.  Pulmonary/Chest: Effort normal. No respiratory distress. He has wheezes (mild). He has no rales.  coarse  Abdominal: Soft. Bowel sounds are normal. He exhibits no distension and no mass. There is no tenderness. There is no rebound and no guarding.  Musculoskeletal: Normal range of motion. He exhibits no edema.  Lymphadenopathy:    He has no cervical adenopathy.  Neurological: He is alert and oriented to person, place, and time.  CN grossly intact, station and gait intact  Skin: Skin is warm  and dry. No rash noted.  Psychiatric: He has a normal mood and affect. His behavior is normal. Judgment and thought content normal.  Nursing note and vitals reviewed.  Results for orders placed or performed in visit on 02/06/17  CBC with Differential/Platelet  Result Value Ref Range   WBC 3.9 (L) 4.0 - 10.3 10e3/uL   NEUT# 2.6 1.5 - 6.5 10e3/uL   HGB 17.0 13.0 - 17.1 g/dL   HCT 49.7 38.4 - 49.9 %   Platelets 132 (L) 140 - 400 10e3/uL   MCV 106.0 (H) 79.3 - 98.0 fL   MCH 36.2 (H) 27.2 - 33.4 pg   MCHC 34.2 32.0 - 36.0 g/dL   RBC 4.69 4.20 - 5.82 10e6/uL   RDW 13.9 11.0 - 14.6 %   lymph# 0.8 (L) 0.9 - 3.3 10e3/uL   MONO# 0.4 0.1 - 0.9 10e3/uL   Eosinophils Absolute 0.0 0.0 - 0.5 10e3/uL   Basophils Absolute 0.0 0.0 - 0.1 10e3/uL   NEUT% 67.1 39.0 - 75.0 %   LYMPH% 21.3 14.0 - 49.0 %   MONO% 10.3  0.0 - 14.0 %   EOS% 0.8 0.0 - 7.0 %   BASO% 0.5 0.0 - 2.0 %  Comprehensive metabolic panel  Result Value Ref Range   Sodium 140 136 - 145 mEq/L   Potassium 4.0 3.5 - 5.1 mEq/L   Chloride 101 98 - 109 mEq/L   CO2 27 22 - 29 mEq/L   Glucose 84 70 - 140 mg/dl   BUN 9.7 7.0 - 26.0 mg/dL   Creatinine 0.8 0.7 - 1.3 mg/dL   Total Bilirubin 0.82 0.20 - 1.20 mg/dL   Alkaline Phosphatase 89 40 - 150 U/L   AST 21 5 - 34 U/L   ALT 18 0 - 55 U/L   Total Protein 7.6 6.4 - 8.3 g/dL   Albumin 4.2 3.5 - 5.0 g/dL   Calcium 9.8 8.4 - 10.4 mg/dL   Anion Gap 12 (H) 3 - 11 mEq/L   EGFR >90 >90 ml/min/1.73 m2      Assessment & Plan:   Problem List Items Addressed This Visit    Advanced care planning/counseling discussion    Advanced directive: He does not have set up but would want wife to be HCPOA.       CAD (coronary artery disease)    Continue aspirin.      Relevant Medications   losartan-hydrochlorothiazide (HYZAAR) 100-25 MG tablet   Cancer of lower lobe of left lung ([place])    Appreciate onc care - pending f/u in next few weeks after PET scan.       COPD (chronic obstructive  pulmonary disease) (HCC) (Chronic)    Severe obstruction by PFTs 09/2015. I did recommend he restart symbicort - refilled.       Relevant Medications   budesonide-formoterol (SYMBICORT) 160-4.5 MCG/ACT inhaler   Health care maintenance - Primary    Preventative protocols reviewed and updated unless pt declined. Discussed healthy diet and lifestyle.       Herpes zoster    H/o this. Declines shingles vaccine.       HLD (hyperlipidemia)    Check dLDL today. Not on statin.       Relevant Medications   losartan-hydrochlorothiazide (HYZAAR) 100-25 MG tablet   Other Relevant Orders   LDL Cholesterol, Direct   HTN (hypertension) (Chronic)    Chronic, stable continue current regimen.       Relevant Medications   losartan-hydrochlorothiazide (HYZAAR) 100-25 MG tablet   Prostate cancer ([place])    Declined return to urology. Check PSA. Declines DRE today.       Relevant Orders   PSA   Rectal polyp    3cm TVA on colonoscopy 09/2015 - never returned for further treatment because of concomitant lung cancer s/p chemo last year [facility]). Discussed this, pt desires to await PET results looking for metabolic activity in colon.       Smoker    Continue to encourage cessation. Down to 1/2 ppd.       Thoracic aorta atherosclerosis (HCC)    Continue aspirin. Hold statin pending upcoming PET results.       Relevant Medications   losartan-hydrochlorothiazide (HYZAAR) 100-25 MG tablet       Follow up plan: Return in about 1 year (around 02/28/2018).  [name], MD

## 2017-02-28 NOTE — Assessment & Plan Note (Signed)
Severe obstruction by PFTs 09/2015. I did recommend he restart symbicort - refilled.

## 2017-03-01 LAB — PSA: PSA: 2.32 ng/mL (ref 0.10–4.00)

## 2017-03-01 LAB — LDL CHOLESTEROL, DIRECT: Direct LDL: 100 mg/dL

## 2017-03-08 ENCOUNTER — Ambulatory Visit (HOSPITAL_COMMUNITY)
Admission: RE | Admit: 2017-03-08 | Discharge: 2017-03-08 | Disposition: A | Discharge status: Home / Self Care | Payer: BLUE CROSS/BLUE SHIELD | Source: Ambulatory Visit | Attending: Oncology | Admitting: Oncology

## 2017-03-08 DIAGNOSIS — C3432 Malignant neoplasm of lower lobe, left bronchus or lung: Secondary | ICD-10-CM | POA: Insufficient documentation

## 2017-03-08 DIAGNOSIS — C3490 Malignant neoplasm of unspecified part of unspecified bronchus or lung: Secondary | ICD-10-CM | POA: Diagnosis not present

## 2017-03-08 LAB — GLUCOSE, CAPILLARY: Glucose-Capillary: 94 mg/dL (ref 65–99)

## 2017-03-08 MED ORDER — FLUDEOXYGLUCOSE F - 18 (FDG) INJECTION
10.5000 | Freq: Once | INTRAVENOUS | Status: AC | PRN
  Administered 2017-03-08: 10.5 via INTRAVENOUS

## 2017-03-13 ENCOUNTER — Telehealth: Payer: Self-pay | Admitting: Internal Medicine

## 2017-03-13 ENCOUNTER — Ambulatory Visit (HOSPITAL_BASED_OUTPATIENT_CLINIC_OR_DEPARTMENT_OTHER): Payer: BLUE CROSS/BLUE SHIELD | Admitting: Internal Medicine

## 2017-03-13 ENCOUNTER — Encounter: Payer: Self-pay | Admitting: Radiation Oncology

## 2017-03-13 ENCOUNTER — Other Ambulatory Visit (HOSPITAL_BASED_OUTPATIENT_CLINIC_OR_DEPARTMENT_OTHER): Payer: BLUE CROSS/BLUE SHIELD

## 2017-03-13 ENCOUNTER — Encounter: Payer: Self-pay | Admitting: Internal Medicine

## 2017-03-13 VITALS — BP 144/73 | HR 66 | Temp 98.3°F | Resp 18 | Ht 69.5 in | Wt 207.0 lb

## 2017-03-13 DIAGNOSIS — C3432 Malignant neoplasm of lower lobe, left bronchus or lung: Secondary | ICD-10-CM

## 2017-03-13 DIAGNOSIS — J449 Chronic obstructive pulmonary disease, unspecified: Secondary | ICD-10-CM

## 2017-03-13 DIAGNOSIS — Z5111 Encounter for antineoplastic chemotherapy: Secondary | ICD-10-CM

## 2017-03-13 DIAGNOSIS — R0609 Other forms of dyspnea: Secondary | ICD-10-CM | POA: Diagnosis not present

## 2017-03-13 LAB — CBC WITH DIFFERENTIAL/PLATELET
BASO%: 0.2 % (ref 0.0–2.0)
Basophils Absolute: 0 10*3/uL (ref 0.0–0.1)
EOS%: 1.3 % (ref 0.0–7.0)
Eosinophils Absolute: 0.1 10*3/uL (ref 0.0–0.5)
HCT: 46.8 % (ref 38.4–49.9)
HGB: 16.2 g/dL (ref 13.0–17.1)
LYMPH%: 28.7 % (ref 14.0–49.0)
MCH: 36.7 pg — ABNORMAL HIGH (ref 27.2–33.4)
MCHC: 34.6 g/dL (ref 32.0–36.0)
MCV: 105.9 fL — ABNORMAL HIGH (ref 79.3–98.0)
MONO#: 0.5 10*3/uL (ref 0.1–0.9)
MONO%: 9.6 % (ref 0.0–14.0)
NEUT#: 2.8 10*3/uL (ref 1.5–6.5)
NEUT%: 60.2 % (ref 39.0–75.0)
Platelets: 131 10*3/uL — ABNORMAL LOW (ref 140–400)
RBC: 4.42 10*6/uL (ref 4.20–5.82)
RDW: 13.7 % (ref 11.0–14.6)
WBC: 4.7 10*3/uL (ref 4.0–10.3)
lymph#: 1.3 10*3/uL (ref 0.9–3.3)

## 2017-03-13 LAB — COMPREHENSIVE METABOLIC PANEL
ALT: 14 U/L (ref 0–55)
AST: 20 U/L (ref 5–34)
Albumin: 3.8 g/dL (ref 3.5–5.0)
Alkaline Phosphatase: 79 U/L (ref 40–150)
Anion Gap: 8 mEq/L (ref 3–11)
BUN: 10.3 mg/dL (ref 7.0–26.0)
CO2: 29 mEq/L (ref 22–29)
Calcium: 9.7 mg/dL (ref 8.4–10.4)
Chloride: 102 mEq/L (ref 98–109)
Creatinine: 0.9 mg/dL (ref 0.7–1.3)
EGFR: 86 mL/min/{1.73_m2} — ABNORMAL LOW (ref >90)
Glucose: 101 mg/dl (ref 70–140)
Potassium: 4 mEq/L (ref 3.5–5.1)
Sodium: 139 mEq/L (ref 136–145)
Total Bilirubin: 1.07 mg/dL (ref 0.20–1.20)
Total Protein: 7.1 g/dL (ref 6.4–8.3)

## 2017-03-13 NOTE — Patient Instructions (Signed)
Steps to Quit Smoking Smoking tobacco can be bad for your health. It can also affect almost every organ in your body. Smoking puts you and people around you at risk for many serious long-lasting (chronic) diseases. Quitting smoking is hard, but it is one of the best things that you can do for your health. It is never too late to quit. What are the benefits of quitting smoking? When you quit smoking, you lower your risk for getting serious diseases and conditions. They can include:  Lung cancer or lung disease.  Heart disease.  Stroke.  Heart attack.  Not being able to have children (infertility).  Weak bones (osteoporosis) and broken bones (fractures).  If you have coughing, wheezing, and shortness of breath, those symptoms may get better when you quit. You may also get sick less often. If you are pregnant, quitting smoking can help to lower your chances of having a baby of low birth weight. What can I do to help me quit smoking? Talk with your doctor about what can help you quit smoking. Some things you can do (strategies) include:  Quitting smoking totally, instead of slowly cutting back how much you smoke over a period of time.  Going to in-person counseling. You are more likely to quit if you go to many counseling sessions.  Using resources and support systems, such as: ? Online chats with a counselor. ? Phone quitlines. ? Printed self-help materials. ? Support groups or group counseling. ? Text messaging programs. ? Mobile phone apps or applications.  Taking medicines. Some of these medicines may have nicotine in them. If you are pregnant or breastfeeding, do not take any medicines to quit smoking unless your doctor says it is okay. Talk with your doctor about counseling or other things that can help you.  Talk with your doctor about using more than one strategy at the same time, such as taking medicines while you are also going to in-person counseling. This can help make  quitting easier. What things can I do to make it easier to quit? Quitting smoking might feel very hard at first, but there is a lot that you can do to make it easier. Take these steps:  Talk to your family and friends. Ask them to support and encourage you.  Call phone quitlines, reach out to support groups, or work with a counselor.  Ask people who smoke to not smoke around you.  Avoid places that make you want (trigger) to smoke, such as: ? Bars. ? Parties. ? Smoke-break areas at work.  Spend time with people who do not smoke.  Lower the stress in your life. Stress can make you want to smoke. Try these things to help your stress: ? Getting regular exercise. ? Deep-breathing exercises. ? Yoga. ? Meditating. ? Doing a body scan. To do this, close your eyes, focus on one area of your body at a time from head to toe, and notice which parts of your body are tense. Try to relax the muscles in those areas.  Download or buy apps on your mobile phone or tablet that can help you stick to your quit plan. There are many free apps, such as QuitGuide from the CDC (Centers for Disease Control and Prevention). You can find more support from smokefree.gov and other websites.  This information is not intended to replace advice given to you by your health care provider. Make sure you discuss any questions you have with your health care provider. Document Released: 04/29/2009 Document   Revised: 02/29/2016 Document Reviewed: 11/17/2014 Elsevier Interactive Patient Education  2018 [employer]  

## 2017-03-13 NOTE — Telephone Encounter (Signed)
Gave patient avs and calendar with upcoming appts.  °

## 2017-03-13 NOTE — Progress Notes (Signed)
[place] [phone]   [phone]  OFFICE PROGRESS NOTE  [name], MD [place] [address]  DIAGNOSIS: Stage IIIB (T1b, N3, M0) non-small cell lung cancer favoring adenocarcinoma presented with left lower lobe pulmonary nodule in addition to left hilar and bilateral mediastinal lymphadenopathy in addition to right supraclavicular lymph nodes diagnosed in April 2017.  PRIOR THERAPY: 1) Concurrent chemoradiation with weekly carboplatin for AUC of 2 and paclitaxel 45 mg/M2 started on 11/15/2015. He is status post 6 cycles. Last dose was getting 12/20/2015 with partial response. 2) Consolidation chemotherapy with carboplatin for AUC of 5 and paclitaxel 175 MG/M2 every 3 weeks with Neulasta support. Status post 3 cycles.  CURRENT THERAPY: Observation.  INTERVAL HISTORY: Brian White 66 y.o. male returns to the clinic today for follow-up visit accompanied by his wife. The patient is feeling fine today with no specific complaints except for mild shortness of breath with exertion. He denied having any chest pain, cough or hemoptysis. He denied having any recent weight loss or night sweats. He has no nausea, vomiting, diarrhea or constipation. He was found on previous CT scan of the chest to have questionable disease recurrence and I ordered a PET scan which was performed recently and the patient is here today for evaluation and discussion of his scan results and treatment options.  MEDICAL HISTORY: Past Medical History:  Diagnosis Date  . Adenocarcinoma, lung ([place]) 10/08/2015   2.4cm spiculated LLL nodule suspicious for primary bronchogenic carcinoma by CT 09/2015 - adenocarcinoma by endobronchial biopsy  . Allergy   . Arthritis   . CAD (coronary artery disease)    mild MI; pt unaware - seen on stress test  . COPD (chronic obstructive pulmonary disease) ([place]) 06/26/2011   Mild centrilobular and paraseptal emphysema by CT  09/2015   . ED (erectile dysfunction)   . H/O anaphylactic shock 2013   hives,hypotension,syncope  . H/O seasonal allergies   . Herpes zoster 12/06/2015  . Hypertension   . Polycythemia 01/25/2014  . Prostate cancer [facility]) 2013   s/p radiation therapy March 2013, planned f/u with uro after XRT  . Radiation 09/18/2011 through 11/10/2011   Prostate 7600 cGy 40 sessions, seminal vesicles 5600 cGy 40 sessions    . Radiation 11/10/15-12/22/15   chest 60 Gy  . Shortness of breath dyspnea   . Smoker   . Thoracic aorta atherosclerosis ([place]) 10/08/2015   Mild by CT 09/2015     ALLERGIES:  is allergic to onion.  MEDICATIONS:  Current Outpatient Prescriptions  Medication Sig Dispense Refill  . aspirin 81 MG tablet Take 81 mg by mouth daily. Reported on 12/21/2015    . budesonide-formoterol (SYMBICORT) 160-4.5 MCG/ACT inhaler Inhale 1 puff into the lungs 2 (two) times daily. 3 Inhaler 3  . cetirizine (ZYRTEC) 10 MG chewable tablet Chew 10 mg by mouth daily.    Marland Kitchen losartan-hydrochlorothiazide (HYZAAR) 100-25 MG tablet Take 1 tablet by mouth daily. 90 tablet 3  . EPINEPHrine (EPIPEN) 0.3 mg/0.3 mL DEVI Inject 0.3 mLs (0.3 mg total) into the muscle once. (Patient not taking: Reported on 03/13/2017) 1 Device prn  . ibuprofen (ADVIL,MOTRIN) 200 MG tablet Take 600 mg by mouth every 6 (six) hours as needed (Pain). Reported on 12/21/2015    . sildenafil (VIAGRA) 100 MG tablet Take 0.5-1 tablets (50-100 mg total) by mouth daily as needed for erectile dysfunction. 4 tablet 0   No current facility-administered medications for this visit.  SURGICAL HISTORY:  Past Surgical History:  Procedure Laterality Date  . COLONOSCOPY W/ BIOPSIES AND POLYPECTOMY  10/11/15   mult TAs, 3cm rectal polypoid lesion; rec further treatment, diverticulosis ([place])  . ENDOBRONCHIAL ULTRASOUND Bilateral 10/18/2015   Procedure: ENDOBRONCHIAL ULTRASOUND;  Surgeon: Javier Glazier, MD;  Location: [facility];   Service: Cardiopulmonary;  Laterality: Bilateral;  . HERNIA REPAIR Right 2005   with mesh  . INSERTION PROSTATE RADIATION SEED  08/2011   prostate cancer, [place]    REVIEW OF SYSTEMS:  Constitutional: negative Eyes: negative Ears, nose, mouth, throat, and face: negative Respiratory: positive for dyspnea on exertion Cardiovascular: negative Gastrointestinal: negative Genitourinary:negative Integument/breast: negative Hematologic/lymphatic: negative Musculoskeletal:negative Neurological: negative Behavioral/Psych: negative Endocrine: negative Allergic/Immunologic: negative   PHYSICAL EXAMINATION: General appearance: alert, cooperative and no distress Head: Normocephalic, without obvious abnormality, atraumatic Neck: no adenopathy, no JVD, supple, symmetrical, trachea midline and thyroid not enlarged, symmetric, no tenderness/mass/nodules Lymph nodes: Cervical, supraclavicular, and axillary nodes normal. Resp: clear to auscultation bilaterally Back: symmetric, no curvature. ROM normal. No CVA tenderness. Cardio: regular rate and rhythm, S1, S2 normal, no murmur, click, rub or gallop GI: soft, non-tender; bowel sounds normal; no masses,  no organomegaly Extremities: extremities normal, atraumatic, no cyanosis or edema Neurologic: Alert and oriented X 3, normal strength and tone. Normal symmetric reflexes. Normal coordination and gait  ECOG PERFORMANCE STATUS: 1 - Symptomatic but completely ambulatory  Blood pressure (!) 144/73, pulse 66, temperature 98.3 F (36.8 C), temperature source Oral, resp. rate 18, height 5' 9.5" (1.765 m), weight 207 lb (93.9 kg), SpO2 99 %.  LABORATORY DATA: Lab Results  Component Value Date   WBC 4.7 03/13/2017   HGB 16.2 03/13/2017   HCT 46.8 03/13/2017   MCV 105.9 (H) 03/13/2017   PLT 131 (L) 03/13/2017      Chemistry      Component Value Date/Time   NA 140 02/06/2017 0915   K 4.0 02/06/2017 0915   CL 99 08/27/2015 0946   CO2 27  02/06/2017 0915   BUN 9.7 02/06/2017 0915   CREATININE 0.8 02/06/2017 0915      Component Value Date/Time   CALCIUM 9.8 02/06/2017 0915   ALKPHOS 89 02/06/2017 0915   AST 21 02/06/2017 0915   ALT 18 02/06/2017 0915   BILITOT 0.82 02/06/2017 0915       RADIOGRAPHIC STUDIES: Nm Pet Image Restag (ps) Skull Base To Thigh  Result Date: 03/08/2017 CLINICAL DATA:  Subsequent treatment strategy for non-small cell lung cancer. EXAM: NUCLEAR MEDICINE PET SKULL BASE TO THIGH TECHNIQUE: 10.5 mCi F-18 FDG was injected intravenously. Full-ring PET imaging was performed from the skull base to thigh after the radiotracer. CT data was obtained and used for attenuation correction and anatomic localization. FASTING BLOOD GLUCOSE:  Value: 94 mg/dl COMPARISON:  Multiple prior chest CTs. The most recent is 02/06/2017. Prior PET-CT 10/13/2015 FINDINGS: NECK: No hypermetabolic lymph nodes in the neck. CHEST: The bilobed soft tissue lesion in the left lower lobe adjacent to an inferior pulmonary vein on image number 46 demonstrates hypermetabolism. SUV max is 7.6. Findings consistent with recurrent neoplasm. No enlarged or hypermetabolic mediastinal or hilar lymph nodes. No other pulmonary nodules to suggest pulmonary metastatic disease. Stable mild cardiac enlargement and coronary artery calcifications. Calcification in the wall of the left ventricle likely related to prior infarction. ABDOMEN/PELVIS: No abnormal hypermetabolic activity within the liver, pancreas, adrenal glands, or spleen. No hypermetabolic lymph nodes in the abdomen or pelvis. New focus of hypermetabolism noted in the  left aspect of the prostate gland possibly reflecting small focus of recurrent prostate cancer or focal inflammation. SUV max is 8.4. No enlarged or hypermetabolic pelvic lymph nodes. Recommend correlation with PSA level. SKELETON: No focal hypermetabolic activity to suggest skeletal metastasis. IMPRESSION: 1. Hypermetabolic bilobed soft  tissue lesion in the left lower lobe consistent with recurrent neoplasm. 2. No enlarged or hypermetabolic mediastinal or hilar lymph nodes or evidence of pulmonary metastatic disease. 3. No recurrent supraclavicular adenopathy and no axillary adenopathy. 4. No findings for abdominal/pelvic metastatic disease or osseous metastatic disease. 5. New focus of hypermetabolism in the left aspect of the prostate gland could reflect recurrent tumor or focal inflammation. Recommend correlation with PSA. Electronically Signed   By: Marijo Sanes M.D.   On: 03/08/2017 10:32    ASSESSMENT AND PLAN:  This is a very pleasant 66 years old white male with a stage IIIB non-small cell lung cancer favoring adenocarcinoma status post concurrent chemoradiation followed by consolidation chemotherapy. The patient is currently on observation and doing fine. The patient was found to her recent CT scan to have questionable disease recurrence and he had a PET scan performed recently. I personally and independently reviewed the PET scan images and discussed the results with the patient and his wife today. Unfortunately the scan showed hypermetabolic bilobed soft tissue lesion in the left lower lobe consistent with recurrent disease but no other evidence of hypermetabolic lymphadenopathy or distant disease. I discussed with the patient several options for management of his condition including consideration of retreatment with radiotherapy to this local recurrence. If the patient is not eligible for any further radiation therapy, I would consider him for treatment with systemic chemotherapy plus/minus immunotherapy. I will arrange for the patient to come back for follow-up visit in 3 months for reevaluation with repeat CT scan of the chest after his treatment with radiotherapy but I would see him sooner if he is not eligible for the radiotherapy option. The patient and his wife agreed to the current plan. He was advised to call  immediately if he has any concerning symptoms in the interval. The patient voices understanding of current disease status and treatment options and is in agreement with the current care plan. All questions were answered. The patient knows to call the clinic with any problems, questions or concerns. We can certainly see the patient much sooner if necessary.  Disclaimer: This note was dictated with voice recognition software. Similar sounding words can inadvertently be transcribed and may not be corrected upon review.

## 2017-03-23 NOTE — Progress Notes (Signed)
Histology and Location of Primary Cancer:  non-small cell lung cancer favoring adenocarcinoma presented with left lower lobe pulmonary nodule   Location(s) of Symptomatic tumor(s): Pet scan from 8/23 showed hypermetabolic bilobed soft tissue lesion in the left lower lobe consistent with recurrent disease.  Past/Anticipated chemotherapy by medical oncology, if any: Per Dr. Julien Nordmann "if the patient is not eligible for any further radiation therapy, I would consider him for treatment with systemic chemotherapy plus/minus immunotherapy."  Patient's main complaints related to symptomatic tumor(s) are:   Pain on a scale of 0-10 is:    SAFETY ISSUES:  Prior radiation? 11/10/15-12/22/15 chest 60 Gy, 09/18/2011 through 11/10/2011 Prostate 7600 cGy 40 sessions, seminal vesicles 5600 cGy 40 sessions    Pacemaker/ICD?   Possible current pregnancy? no  Is the patient on methotrexate?   Additional Complaints / other details:

## 2017-03-28 ENCOUNTER — Ambulatory Visit
Admission: RE | Admit: 2017-03-28 | Discharge: 2017-03-28 | Disposition: A | Discharge status: Home / Self Care | Payer: BLUE CROSS/BLUE SHIELD | Source: Ambulatory Visit | Attending: Radiation Oncology | Admitting: Radiation Oncology

## 2017-03-28 ENCOUNTER — Encounter: Payer: Self-pay | Admitting: Radiation Oncology

## 2017-03-28 VITALS — BP 129/80 | HR 60 | Temp 98.0°F | Resp 20 | Wt 209.1 lb

## 2017-03-28 DIAGNOSIS — Z9889 Other specified postprocedural states: Secondary | ICD-10-CM | POA: Insufficient documentation

## 2017-03-28 DIAGNOSIS — C3432 Malignant neoplasm of lower lobe, left bronchus or lung: Secondary | ICD-10-CM

## 2017-03-28 DIAGNOSIS — Z7951 Long term (current) use of inhaled steroids: Secondary | ICD-10-CM | POA: Diagnosis not present

## 2017-03-28 DIAGNOSIS — Z923 Personal history of irradiation: Secondary | ICD-10-CM | POA: Diagnosis not present

## 2017-03-28 DIAGNOSIS — Z91018 Allergy to other foods: Secondary | ICD-10-CM | POA: Diagnosis not present

## 2017-03-28 DIAGNOSIS — Z7982 Long term (current) use of aspirin: Secondary | ICD-10-CM | POA: Diagnosis not present

## 2017-03-28 NOTE — Progress Notes (Addendum)
Histology and Location of Primary Cancer:  non-small cell lung cancer favoring adenocarcinoma presented with left lower lobe pulmonary nodule   Location(s) of Symptomatic tumor(s): Pet scan from 8/23 showed hypermetabolic bilobed soft tissue lesion in the left lower lobe consistent with recurrent disease.  Past/Anticipated chemotherapy by medical oncology, if any: Per Dr. Julien Nordmann "if the patient is not eligible for any further radiation therapy, I would consider him for treatment with systemic chemotherapy plus/minus immunotherapy."  Patient's main complaints related to symptomatic tumor(s) QPR:FFMB  Pain on a scale of 0-10 is: None  SAFETY ISSUES:  Prior radiation? 11/10/15-12/22/15 chest 60 Gy, 09/18/2011 through 11/10/2011 Prostate 7600 cGy 40 sessions, seminal vesicles 5600 cGy 40 sessions    Pacemaker/ICD? Noe  Possible current pregnancy? no  Is the patient on methotrexate? None Labs  02-28-2017 PSA 2.32 03-13-2017 Bun 10.3 Creatinine 0.9  Additional Complaints / other details:  None Vitals:   03/28/17 1105  BP: 129/80  Pulse: 60  Resp: 20  Temp: 98 F (36.7 C)  TempSrc: Oral  SpO2: 100%  Weight: 209 lb 2 oz (94.9 kg)   . Wt Readings from Last 3 Encounters:  03/28/17 209 lb 2 oz (94.9 kg)  03/13/17 207 lb (93.9 kg)  02/28/17 207 lb (93.9 kg)

## 2017-03-28 NOTE — Progress Notes (Signed)
Radiation Oncology         [phone] ________________________________  Name: Brian White MRN: [id number]  Date: 03/28/2017  DOB: 1951-04-08  Re-evaluation Visit Note  CC: [name], MD  [name], MD    ICD-10-CM   1. Cancer of lower lobe of left lung ([facility]) C34.32     Diagnosis: Recurrent non-small cell lung cancer  Interval Since Last Radiation:  1 year and 3 months. Treatment dates, 11/10/2015-12/22/2015 (chest 60 Gy) 09/18/2011-11/10/2011 (Prostate 7600 cGy 40 sessions, seminal vesicles 5600 cGy 40 sessions)  Narrative:  Of note since the patient last visit, he underwent multiple CT chest with contrast with his most recent being on 02/06/2017 with results revealing: IMPRESSION: 1. Increase in size of left lower lobe perihilar nodularity worrisome for tumor recurrence. Consider further evaluation with PET-CT. 2.  Aortic atherosclerosis and coronary artery calcification. He also underwent a PET scan on 03/08/2017 with results revealing: IMPRESSION: 1. Hypermetabolic bilobed soft tissue lesion in the left lower lobe consistent with recurrent neoplasm. 2. No enlarged or hypermetabolic mediastinal or hilar lymph nodes or evidence of pulmonary metastatic disease. 3. No recurrent supraclavicular adenopathy and no axillary adenopathy. 4. No findings for abdominal/pelvic metastatic disease or osseous metastatic disease. 5. New focus of hypermetabolism in the left aspect of the prostate gland could reflect recurrent tumor or focal inflammation. Recommend correlation with PSA.   The patient returns today for routine follow-up.  He reports that his Medical Oncologist, Dr. Julien Nordmann referred him back to Dr. Sondra Come for re-evaluation due to recent abnormal imaging. Per Dr. Worthy Flank previous note, he recommended systemic chemotherapy plus/minus immunotherapy if the patient was unable to obtain repeat radiation therapy. He notes that he is unsure of how long its been since he last finished  chemotherapy, but notes it be approximately up to 1 year ago. He states that he didn't have to have an esophageal dilator following his prior radiation treatments. He notes that he slowly was able to eat again without issues following previous radiation treatments.   On review of systems, pt denies HA, decreased appetite, trouble swallowing, decreased energy levels, pain, CP, or hemoptysis.    ALLERGIES:  is allergic to onion.  Meds: Current Outpatient Prescriptions  Medication Sig Dispense Refill  . aspirin 81 MG tablet Take 81 mg by mouth daily. Reported on 12/21/2015    . budesonide-formoterol (SYMBICORT) 160-4.5 MCG/ACT inhaler Inhale 1 puff into the lungs 2 (two) times daily. 3 Inhaler 3  . cetirizine (ZYRTEC) 10 MG chewable tablet Chew 10 mg by mouth daily.    Marland Kitchen guaiFENesin (MUCINEX) 600 MG 12 hr tablet Take by mouth 2 (two) times daily.    Marland Kitchen ibuprofen (ADVIL,MOTRIN) 200 MG tablet Take 600 mg by mouth every 6 (six) hours as needed (Pain). Reported on 12/21/2015    . losartan-hydrochlorothiazide (HYZAAR) 100-25 MG tablet Take 1 tablet by mouth daily. 90 tablet 3   No current facility-administered medications for this encounter.   REVIEW OF SYSTEMS: A 10+ POINT REVIEW OF SYSTEMS WAS OBTAINED including neurology, dermatology, psychiatry, cardiac, respiratory, lymph, extremities, GI, GU, musculoskeletal, constitutional, reproductive, HEENT. All pertinent positives are noted in the HPI. All others are negative.  Physical Findings: The patient is in no acute distress. Patient is alert and oriented.  weight is 209 lb 2 oz (94.9 kg). His oral temperature is 98 F (36.7 C). His blood pressure is 129/80 and his pulse is 60. His respiration is 20 and oxygen saturation is 100%. .  No significant changes.  Lungs are clear to auscultation bilaterally. Heart has regular rate and rhythm. No palpable cervical, supraclavicular, or axillary adenopathy. Abdomen soft, non-tender, normal bowel sounds.    Lab  Findings: Lab Results  Component Value Date   WBC 4.7 03/13/2017   HGB 16.2 03/13/2017   HCT 46.8 03/13/2017   MCV 105.9 (H) 03/13/2017   PLT 131 (L) 03/13/2017    Radiographic Findings: Nm Pet Image Restag (ps) Skull Base To Thigh  Result Date: 03/08/2017 CLINICAL DATA:  Subsequent treatment strategy for non-small cell lung cancer. EXAM: NUCLEAR MEDICINE PET SKULL BASE TO THIGH TECHNIQUE: 10.5 mCi F-18 FDG was injected intravenously. Full-ring PET imaging was performed from the skull base to thigh after the radiotracer. CT data was obtained and used for attenuation correction and anatomic localization. FASTING BLOOD GLUCOSE:  Value: 94 mg/dl COMPARISON:  Multiple prior chest CTs. The most recent is 02/06/2017. Prior PET-CT 10/13/2015 FINDINGS: NECK: No hypermetabolic lymph nodes in the neck. CHEST: The bilobed soft tissue lesion in the left lower lobe adjacent to an inferior pulmonary vein on image number 46 demonstrates hypermetabolism. SUV max is 7.6. Findings consistent with recurrent neoplasm. No enlarged or hypermetabolic mediastinal or hilar lymph nodes. No other pulmonary nodules to suggest pulmonary metastatic disease. Stable mild cardiac enlargement and coronary artery calcifications. Calcification in the wall of the left ventricle likely related to prior infarction. ABDOMEN/PELVIS: No abnormal hypermetabolic activity within the liver, pancreas, adrenal glands, or spleen. No hypermetabolic lymph nodes in the abdomen or pelvis. New focus of hypermetabolism noted in the left aspect of the prostate gland possibly reflecting small focus of recurrent prostate cancer or focal inflammation. SUV max is 8.4. No enlarged or hypermetabolic pelvic lymph nodes. Recommend correlation with PSA level. SKELETON: No focal hypermetabolic activity to suggest skeletal metastasis. IMPRESSION: 1. Hypermetabolic bilobed soft tissue lesion in the left lower lobe consistent with recurrent neoplasm. 2. No enlarged or  hypermetabolic mediastinal or hilar lymph nodes or evidence of pulmonary metastatic disease. 3. No recurrent supraclavicular adenopathy and no axillary adenopathy. 4. No findings for abdominal/pelvic metastatic disease or osseous metastatic disease. 5. New focus of hypermetabolism in the left aspect of the prostate gland could reflect recurrent tumor or focal inflammation. Recommend correlation with PSA. Electronically Signed   By: Marijo Sanes M.D.   On: 03/08/2017 10:32    Impression:  Recurrent non-small cell lung can. Patient has a recurrence at the primary site on recent PET scan. I carefully reviewed his radiation treatment fields from 2017 and this area in the left lower lung received full dose radiation to 60 Gy along with radiosensitizing chemotherapy, therefore I would not recommend treating this area at this time. Patient does have other options including systemic chemotherapy plus/minus immunotherapy.  If The patient has persistent disease at this site after chemotherapy plus or minus immunotherapy then  I would strongly consider radiation therapy at that time since he will have exhausted all other treatment modalities.  Plan:  I will speak with Dr. Julien Nordmann to let him know my thoughts, patient will get in with Dr. Julien Nordmann in the next couple weeks to proceed with systemic chemotherapy.   ____________________________________ -----------------------------------  Blair Promise, PhD, MD   This document serves as a record of services personally performed by Gery Pray, MD. It was created on her behalf by Steva Colder, a trained medical scribe. The creation of this record is based on the scribe's personal observations and the provider's statements to them. This document has been checked and  approved by the attending provider.

## 2017-03-31 ENCOUNTER — Encounter: Payer: Self-pay | Admitting: Internal Medicine

## 2017-04-03 ENCOUNTER — Encounter: Payer: Self-pay | Admitting: Internal Medicine

## 2017-04-12 ENCOUNTER — Telehealth: Payer: Self-pay | Admitting: Internal Medicine

## 2017-04-12 NOTE — Telephone Encounter (Signed)
Called patient regarding October appointments

## 2017-04-19 ENCOUNTER — Ambulatory Visit: Payer: BLUE CROSS/BLUE SHIELD | Admitting: Internal Medicine

## 2017-04-19 ENCOUNTER — Other Ambulatory Visit: Payer: BLUE CROSS/BLUE SHIELD

## 2017-05-08 ENCOUNTER — Ambulatory Visit (HOSPITAL_BASED_OUTPATIENT_CLINIC_OR_DEPARTMENT_OTHER): Payer: BLUE CROSS/BLUE SHIELD | Admitting: Internal Medicine

## 2017-05-08 ENCOUNTER — Encounter: Payer: Self-pay | Admitting: Internal Medicine

## 2017-05-08 ENCOUNTER — Telehealth: Payer: Self-pay | Admitting: Internal Medicine

## 2017-05-08 ENCOUNTER — Other Ambulatory Visit (HOSPITAL_BASED_OUTPATIENT_CLINIC_OR_DEPARTMENT_OTHER): Payer: BLUE CROSS/BLUE SHIELD

## 2017-05-08 VITALS — BP 143/72 | HR 58 | Temp 98.6°F | Resp 18 | Ht 69.5 in | Wt 214.0 lb

## 2017-05-08 DIAGNOSIS — J449 Chronic obstructive pulmonary disease, unspecified: Secondary | ICD-10-CM

## 2017-05-08 DIAGNOSIS — Z23 Encounter for immunization: Secondary | ICD-10-CM

## 2017-05-08 DIAGNOSIS — Z7189 Other specified counseling: Secondary | ICD-10-CM | POA: Insufficient documentation

## 2017-05-08 DIAGNOSIS — R05 Cough: Secondary | ICD-10-CM | POA: Diagnosis not present

## 2017-05-08 DIAGNOSIS — Z5112 Encounter for antineoplastic immunotherapy: Secondary | ICD-10-CM | POA: Insufficient documentation

## 2017-05-08 DIAGNOSIS — C3432 Malignant neoplasm of lower lobe, left bronchus or lung: Secondary | ICD-10-CM

## 2017-05-08 DIAGNOSIS — R5382 Chronic fatigue, unspecified: Secondary | ICD-10-CM | POA: Insufficient documentation

## 2017-05-08 LAB — CBC WITH DIFFERENTIAL/PLATELET
BASO%: 1.2 % (ref 0.0–2.0)
Basophils Absolute: 0.1 10*3/uL (ref 0.0–0.1)
EOS%: 1.2 % (ref 0.0–7.0)
Eosinophils Absolute: 0.1 10*3/uL (ref 0.0–0.5)
HCT: 46.2 % (ref 38.4–49.9)
HGB: 15.8 g/dL (ref 13.0–17.1)
LYMPH%: 20.4 % (ref 14.0–49.0)
MCH: 36.9 pg — ABNORMAL HIGH (ref 27.2–33.4)
MCHC: 34.1 g/dL (ref 32.0–36.0)
MCV: 108.2 fL — ABNORMAL HIGH (ref 79.3–98.0)
MONO#: 0.5 10*3/uL (ref 0.1–0.9)
MONO%: 9.3 % (ref 0.0–14.0)
NEUT#: 3.4 10*3/uL (ref 1.5–6.5)
NEUT%: 67.9 % (ref 39.0–75.0)
Platelets: 142 10*3/uL (ref 140–400)
RBC: 4.27 10*6/uL (ref 4.20–5.82)
RDW: 14 % (ref 11.0–14.6)
WBC: 5.1 10*3/uL (ref 4.0–10.3)
lymph#: 1 10*3/uL (ref 0.9–3.3)

## 2017-05-08 LAB — COMPREHENSIVE METABOLIC PANEL
ALT: 12 U/L (ref 0–55)
AST: 18 U/L (ref 5–34)
Albumin: 3.9 g/dL (ref 3.5–5.0)
Alkaline Phosphatase: 69 U/L (ref 40–150)
Anion Gap: 11 mEq/L (ref 3–11)
BUN: 10.9 mg/dL (ref 7.0–26.0)
CO2: 28 mEq/L (ref 22–29)
Calcium: 9.5 mg/dL (ref 8.4–10.4)
Chloride: 102 mEq/L (ref 98–109)
Creatinine: 1 mg/dL (ref 0.7–1.3)
EGFR: >60 mL/min/{1.73_m2} (ref >60)
Glucose: 105 mg/dl (ref 70–140)
Potassium: 3.8 mEq/L (ref 3.5–5.1)
Sodium: 142 mEq/L (ref 136–145)
Total Bilirubin: 0.65 mg/dL (ref 0.20–1.20)
Total Protein: 6.8 g/dL (ref 6.4–8.3)

## 2017-05-08 MED ORDER — CYANOCOBALAMIN 1000 MCG/ML IJ SOLN
INTRAMUSCULAR | Status: AC
  Filled 2017-05-08: qty 1

## 2017-05-08 MED ORDER — INFLUENZA VAC SPLIT HIGH-DOSE 0.5 ML IM SUSY
0.5000 mL | PREFILLED_SYRINGE | Freq: Once | INTRAMUSCULAR | Status: AC
  Administered 2017-05-08: 0.5 mL via INTRAMUSCULAR
  Filled 2017-05-08: qty 0.5

## 2017-05-08 MED ORDER — FOLIC ACID 1 MG PO TABS: 1.0000 mg | ORAL_TABLET | Freq: Every day | ORAL | 4 refills | Status: DC

## 2017-05-08 MED ORDER — DEXAMETHASONE 4 MG PO TABS: ORAL_TABLET | ORAL | 0 refills | Status: DC

## 2017-05-08 MED ORDER — PROCHLORPERAZINE MALEATE 10 MG PO TABS: 10.0000 mg | ORAL_TABLET | Freq: Four times a day (QID) | ORAL | 0 refills | Status: DC | PRN

## 2017-05-08 NOTE — Progress Notes (Signed)
[place] [phone]   [phone]  OFFICE PROGRESS NOTE  [name], MD [place] [address]  DIAGNOSIS: recurrent non-small cell lung cancer initially diagnosed as Stage IIIB (T1b, N3, M0) non-small cell lung cancer favoring adenocarcinoma presented with left lower lobe pulmonary nodule in addition to left hilar and bilateral mediastinal lymphadenopathy in addition to right supraclavicular lymph nodes diagnosed in April 2017.  PRIOR THERAPY: 1) Concurrent chemoradiation with weekly carboplatin for AUC of 2 and paclitaxel 45 mg/M2 started on 11/15/2015. He is status post 6 cycles. Last dose was getting 12/20/2015 with partial response. 2) Consolidation chemotherapy with carboplatin for AUC of 5 and paclitaxel 175 MG/M2 every 3 weeks with Neulasta support. Status post 3 cycles. Last dose was given 04/03/2016.  CURRENT THERAPY: systemic chemotherapy with carboplatin for AUC of 5, Alimta 500 MG/M2 and Ketruda 200 MG IV every 3 weeks. First dose 05/16/2017.  INTERVAL HISTORY: Brian White 66 y.o. male returns to the clinic today for follow-up visit accompanied by his wife. The patient is feeling fine today with no specific complaints. He was seen by Dr. Sondra Come for consideration of palliative radiotherapy to the recurrent non-small cell lung cancer but the patient was not a good candidate for more radiation. He denied having any current chest pain, shortness of breath but continues to have cough with no hemoptysis. He denied having any fever or chills. He has no nausea, vomiting, diarrhea or constipation. He denied having any recent weight loss or night sweats. He is here today for evaluation nd discussion of his treatment options.  MEDICAL HISTORY: Past Medical History:  Diagnosis Date  . Adenocarcinoma, lung ([place]) 10/08/2015   2.4cm spiculated LLL nodule suspicious for primary bronchogenic carcinoma by CT 09/2015 -  adenocarcinoma by endobronchial biopsy  . Allergy   . Arthritis   . CAD (coronary artery disease)    mild MI; pt unaware - seen on stress test  . COPD (chronic obstructive pulmonary disease) ([place]) 06/26/2011   Mild centrilobular and paraseptal emphysema by CT 09/2015   . ED (erectile dysfunction)   . H/O anaphylactic shock 2013   hives,hypotension,syncope  . H/O seasonal allergies   . Herpes zoster 12/06/2015  . Hypertension   . Polycythemia 01/25/2014  . Prostate cancer [facility]) 2013   s/p radiation therapy March 2013, planned f/u with uro after XRT  . Radiation 09/18/2011 through 11/10/2011   Prostate 7600 cGy 40 sessions, seminal vesicles 5600 cGy 40 sessions    . Radiation 11/10/15-12/22/15   chest 60 Gy  . Shortness of breath dyspnea   . Smoker   . Thoracic aorta atherosclerosis ([place]) 10/08/2015   Mild by CT 09/2015     ALLERGIES:  is allergic to onion.  MEDICATIONS:  Current Outpatient Prescriptions  Medication Sig Dispense Refill  . aspirin 81 MG tablet Take 81 mg by mouth daily. Reported on 12/21/2015    . budesonide-formoterol (SYMBICORT) 160-4.5 MCG/ACT inhaler Inhale 1 puff into the lungs 2 (two) times daily. 3 Inhaler 3  . cetirizine (ZYRTEC) 10 MG chewable tablet Chew 10 mg by mouth daily.    Marland Kitchen guaiFENesin (MUCINEX) 600 MG 12 hr tablet Take by mouth 2 (two) times daily.    Marland Kitchen ibuprofen (ADVIL,MOTRIN) 200 MG tablet Take 600 mg by mouth every 6 (six) hours as needed (Pain). Reported on 12/21/2015    . losartan-hydrochlorothiazide (HYZAAR) 100-25 MG tablet Take 1 tablet by mouth daily. 90 tablet 3  No current facility-administered medications for this visit.     SURGICAL HISTORY:  Past Surgical History:  Procedure Laterality Date  . COLONOSCOPY W/ BIOPSIES AND POLYPECTOMY  10/11/15   mult TAs, 3cm rectal polypoid lesion; rec further treatment, diverticulosis ([place])  . ENDOBRONCHIAL ULTRASOUND Bilateral 10/18/2015   Procedure: ENDOBRONCHIAL ULTRASOUND;   Surgeon: Javier Glazier, MD;  Location: [facility];  Service: Cardiopulmonary;  Laterality: Bilateral;  . HERNIA REPAIR Right 2005   with mesh  . INSERTION PROSTATE RADIATION SEED  08/2011   prostate cancer, [place]    REVIEW OF SYSTEMS:  Constitutional: negative Eyes: negative Ears, nose, mouth, throat, and face: negative Respiratory: positive for cough Cardiovascular: negative Gastrointestinal: negative Genitourinary:negative Integument/breast: negative Hematologic/lymphatic: negative Musculoskeletal:negative Neurological: negative Behavioral/Psych: negative Endocrine: negative Allergic/Immunologic: negative   PHYSICAL EXAMINATION: General appearance: alert, cooperative and no distress Head: Normocephalic, without obvious abnormality, atraumatic Neck: no adenopathy, no JVD, supple, symmetrical, trachea midline and thyroid not enlarged, symmetric, no tenderness/mass/nodules Lymph nodes: Cervical, supraclavicular, and axillary nodes normal. Resp: clear to auscultation bilaterally Back: symmetric, no curvature. ROM normal. No CVA tenderness. Cardio: regular rate and rhythm, S1, S2 normal, no murmur, click, rub or gallop GI: soft, non-tender; bowel sounds normal; no masses,  no organomegaly Extremities: extremities normal, atraumatic, no cyanosis or edema Neurologic: Alert and oriented X 3, normal strength and tone. Normal symmetric reflexes. Normal coordination and gait  ECOG PERFORMANCE STATUS: 1 - Symptomatic but completely ambulatory  Blood pressure (!) 143/72, pulse (!) 58, temperature 98.6 F (37 C), temperature source Oral, resp. rate 18, height 5' 9.5" (1.765 m), weight 214 lb (97.1 kg), SpO2 98 %.  LABORATORY DATA: Lab Results  Component Value Date   WBC 5.1 05/08/2017   HGB 15.8 05/08/2017   HCT 46.2 05/08/2017   MCV 108.2 (H) 05/08/2017   PLT 142 05/08/2017      Chemistry      Component Value Date/Time   NA 139 03/13/2017 0749   K 4.0 03/13/2017  0749   CL 99 08/27/2015 0946   CO2 29 03/13/2017 0749   BUN 10.3 03/13/2017 0749   CREATININE 0.9 03/13/2017 0749      Component Value Date/Time   CALCIUM 9.7 03/13/2017 0749   ALKPHOS 79 03/13/2017 0749   AST 20 03/13/2017 0749   ALT 14 03/13/2017 0749   BILITOT 1.07 03/13/2017 0749       RADIOGRAPHIC STUDIES: No results found.  ASSESSMENT AND PLAN:  This is a very pleasant 66 years old white male with recurrent non-small cell lung cancer initially diagnosed as stage IIIB non-small cell lung cancer favoring adenocarcinoma status post concurrent chemoradiation followed by consolidation chemotherapy. His last treatment was in September 2017. Recent imaging studies showed evidence for disease recurrence. I had a lengthy discussion with the patient and his wife about his current disease stage, prognosis and treatment options. I gave the patient the option of palliative care versus consideration of palliative systemic chemotherapy in combination with immunotherapy. The patient is interested in treatment and I recommended a regimen for him consisting of carboplatin for AUC of 5, Alimta 500 MG/M2 and Ketruda 200 MG IV every 3 weeks. I discussed with the patient adverse effect of this treatment including but not limited to alopecia, myelosuppression, nausea and vomiting, peripheral neuropathy, liver or renal dysfunction in addition to the adverse effect of his immunotherapy including immune mediated skin rash, diarrhea, inflammation of the liver, kidney, thyroid or other endocrine dysfunction including type 1 diabetes mellitus. The patient would  like to proceed with the treatment as planned and he is expected to start the first dose of his treatment next week. He will receive vitamin B 12 injection today. I will also call his pharmacy with prescription for folic acid 1 mg by mouth daily, Decadron 4 mg by mouth twice a day the day before, day of and day after the chemotherapy. He would come back  for follow-up visit in 5 weeks with the start of cycle #2. The patient will receive flu vaccine today. He was advised to call immediately if he has any concerning symptoms in the interval. The patient voices understanding of current disease status and treatment options and is in agreement with the current care plan. All questions were answered. The patient knows to call the clinic with any problems, questions or concerns. We can certainly see the patient much sooner if necessary.  Disclaimer: This note was dictated with voice recognition software. Similar sounding words can inadvertently be transcribed and may not be corrected upon review.

## 2017-05-08 NOTE — Telephone Encounter (Signed)
Gave avs and calendar for November - January

## 2017-05-08 NOTE — Progress Notes (Signed)
START ON PATHWAY REGIMEN - Non-Small Cell Lung     A cycle is every 21 days:     Pembrolizumab      Pemetrexed      Carboplatin   **Always confirm dose/schedule in your pharmacy ordering system**    Patient Characteristics: Stage IV Metastatic, Nonsquamous, Initial Chemotherapy/Immunotherapy, PS = 0, 1, PD-L1 Expression Positive 1-49% (TPS) / Negative / Not Tested / Awaiting Test Results AJCC T Category: T1b Current Disease Status: Distant Metastases AJCC N Category: N3 AJCC M Category: M1a AJCC 8 Stage Grouping: IVA Histology: Nonsquamous Cell ROS1 Rearrangement Status: Quantity Not Sufficient T790M Mutation Status: Not Applicable - EGFR Mutation Negative/Unknown Other Mutations/Biomarkers: No Other Actionable Mutations PD-L1 Expression Status: Quantity Not Sufficient Chemotherapy/Immunotherapy LOT: Initial Chemotherapy/Immunotherapy Molecular Targeted Therapy: Not Appropriate ALK Translocation Status: Quantity Not Sufficient Would you be surprised if this patient died  in the next year<= I would NOT be surprised if this patient died in the next year EGFR Mutation Status: Quantity Not Sufficient BRAF V600E Mutation Status: Quantity Not Sufficient Performance Status: PS = 0, 1 Intent of Therapy: Non-Curative / Palliative Intent, Discussed with Patient

## 2017-05-08 NOTE — Patient Instructions (Signed)
Steps to Quit Smoking Smoking tobacco can be bad for your health. It can also affect almost every organ in your body. Smoking puts you and people around you at risk for many serious long-lasting (chronic) diseases. Quitting smoking is hard, but it is one of the best things that you can do for your health. It is never too late to quit. What are the benefits of quitting smoking? When you quit smoking, you lower your risk for getting serious diseases and conditions. They can include:  Lung cancer or lung disease.  Heart disease.  Stroke.  Heart attack.  Not being able to have children (infertility).  Weak bones (osteoporosis) and broken bones (fractures).  If you have coughing, wheezing, and shortness of breath, those symptoms may get better when you quit. You may also get sick less often. If you are pregnant, quitting smoking can help to lower your chances of having a baby of low birth weight. What can I do to help me quit smoking? Talk with your doctor about what can help you quit smoking. Some things you can do (strategies) include:  Quitting smoking totally, instead of slowly cutting back how much you smoke over a period of time.  Going to in-person counseling. You are more likely to quit if you go to many counseling sessions.  Using resources and support systems, such as: ? Online chats with a counselor. ? Phone quitlines. ? Printed self-help materials. ? Support groups or group counseling. ? Text messaging programs. ? Mobile phone apps or applications.  Taking medicines. Some of these medicines may have nicotine in them. If you are pregnant or breastfeeding, do not take any medicines to quit smoking unless your doctor says it is okay. Talk with your doctor about counseling or other things that can help you.  Talk with your doctor about using more than one strategy at the same time, such as taking medicines while you are also going to in-person counseling. This can help make  quitting easier. What things can I do to make it easier to quit? Quitting smoking might feel very hard at first, but there is a lot that you can do to make it easier. Take these steps:  Talk to your family and friends. Ask them to support and encourage you.  Call phone quitlines, reach out to support groups, or work with a counselor.  Ask people who smoke to not smoke around you.  Avoid places that make you want (trigger) to smoke, such as: ? Bars. ? Parties. ? Smoke-break areas at work.  Spend time with people who do not smoke.  Lower the stress in your life. Stress can make you want to smoke. Try these things to help your stress: ? Getting regular exercise. ? Deep-breathing exercises. ? Yoga. ? Meditating. ? Doing a body scan. To do this, close your eyes, focus on one area of your body at a time from head to toe, and notice which parts of your body are tense. Try to relax the muscles in those areas.  Download or buy apps on your mobile phone or tablet that can help you stick to your quit plan. There are many free apps, such as QuitGuide from the CDC (Centers for Disease Control and Prevention). You can find more support from smokefree.gov and other websites.  This information is not intended to replace advice given to you by your health care provider. Make sure you discuss any questions you have with your health care provider. Document Released: 04/29/2009 Document   Revised: 02/29/2016 Document Reviewed: 11/17/2014 Elsevier Interactive Patient Education  2018 [employer]  

## 2017-05-16 ENCOUNTER — Other Ambulatory Visit (HOSPITAL_BASED_OUTPATIENT_CLINIC_OR_DEPARTMENT_OTHER): Payer: BLUE CROSS/BLUE SHIELD

## 2017-05-16 ENCOUNTER — Ambulatory Visit (HOSPITAL_BASED_OUTPATIENT_CLINIC_OR_DEPARTMENT_OTHER): Payer: BLUE CROSS/BLUE SHIELD

## 2017-05-16 VITALS — BP 129/59 | HR 54 | Temp 98.0°F | Resp 18

## 2017-05-16 DIAGNOSIS — Z5111 Encounter for antineoplastic chemotherapy: Secondary | ICD-10-CM | POA: Diagnosis not present

## 2017-05-16 DIAGNOSIS — C3432 Malignant neoplasm of lower lobe, left bronchus or lung: Secondary | ICD-10-CM

## 2017-05-16 DIAGNOSIS — R5382 Chronic fatigue, unspecified: Secondary | ICD-10-CM

## 2017-05-16 DIAGNOSIS — Z5112 Encounter for antineoplastic immunotherapy: Secondary | ICD-10-CM

## 2017-05-16 LAB — CBC WITH DIFFERENTIAL/PLATELET
BASO%: 0.2 % (ref 0.0–2.0)
Basophils Absolute: 0 10*3/uL (ref 0.0–0.1)
EOS%: 0 % (ref 0.0–7.0)
Eosinophils Absolute: 0 10*3/uL (ref 0.0–0.5)
HCT: 47.4 % (ref 38.4–49.9)
HGB: 16.2 g/dL (ref 13.0–17.1)
LYMPH%: 7.8 % — ABNORMAL LOW (ref 14.0–49.0)
MCH: 36.5 pg — ABNORMAL HIGH (ref 27.2–33.4)
MCHC: 34.2 g/dL (ref 32.0–36.0)
MCV: 106.8 fL — ABNORMAL HIGH (ref 79.3–98.0)
MONO#: 0.3 10*3/uL (ref 0.1–0.9)
MONO%: 3.4 % (ref 0.0–14.0)
NEUT#: 7.3 10*3/uL — ABNORMAL HIGH (ref 1.5–6.5)
NEUT%: 88.6 % — ABNORMAL HIGH (ref 39.0–75.0)
Platelets: 151 10*3/uL (ref 140–400)
RBC: 4.44 10*6/uL (ref 4.20–5.82)
RDW: 13.7 % (ref 11.0–14.6)
WBC: 8.2 10*3/uL (ref 4.0–10.3)
lymph#: 0.6 10*3/uL — ABNORMAL LOW (ref 0.9–3.3)

## 2017-05-16 LAB — COMPREHENSIVE METABOLIC PANEL
ALT: 14 U/L (ref 0–55)
AST: 18 U/L (ref 5–34)
Albumin: 4 g/dL (ref 3.5–5.0)
Alkaline Phosphatase: 78 U/L (ref 40–150)
Anion Gap: 10 mEq/L (ref 3–11)
BUN: 12.1 mg/dL (ref 7.0–26.0)
CO2: 24 mEq/L (ref 22–29)
Calcium: 9.7 mg/dL (ref 8.4–10.4)
Chloride: 102 mEq/L (ref 98–109)
Creatinine: 0.8 mg/dL (ref 0.7–1.3)
EGFR: >60 mL/min/{1.73_m2} (ref >60)
Glucose: 117 mg/dl (ref 70–140)
Potassium: 3.9 mEq/L (ref 3.5–5.1)
Sodium: 136 mEq/L (ref 136–145)
Total Bilirubin: 0.62 mg/dL (ref 0.20–1.20)
Total Protein: 7.3 g/dL (ref 6.4–8.3)

## 2017-05-16 LAB — TSH: TSH: 0.165 m(IU)/L — ABNORMAL LOW (ref 0.320–4.118)

## 2017-05-16 MED ORDER — PALONOSETRON HCL INJECTION 0.25 MG/5ML
INTRAVENOUS | Status: AC
  Filled 2017-05-16: qty 5

## 2017-05-16 MED ORDER — SODIUM CHLORIDE 0.9 % IV SOLN
500.0000 mg/m2 | Freq: Once | INTRAVENOUS | Status: AC
  Administered 2017-05-16: 1100 mg via INTRAVENOUS
  Filled 2017-05-16: qty 40

## 2017-05-16 MED ORDER — SODIUM CHLORIDE 0.9 % IV SOLN
200.0000 mg | Freq: Once | INTRAVENOUS | Status: AC
  Administered 2017-05-16: 200 mg via INTRAVENOUS
  Filled 2017-05-16: qty 8

## 2017-05-16 MED ORDER — PALONOSETRON HCL INJECTION 0.25 MG/5ML
0.2500 mg | Freq: Once | INTRAVENOUS | Status: AC
  Administered 2017-05-16: 0.25 mg via INTRAVENOUS

## 2017-05-16 MED ORDER — SODIUM CHLORIDE 0.9 % IV SOLN
Freq: Once | INTRAVENOUS | Status: AC
  Administered 2017-05-16: 15:00:00 via INTRAVENOUS
  Filled 2017-05-16: qty 5

## 2017-05-16 MED ORDER — SODIUM CHLORIDE 0.9 % IV SOLN
Freq: Once | INTRAVENOUS | Status: AC
  Administered 2017-05-16: 14:00:00 via INTRAVENOUS

## 2017-05-16 MED ORDER — SODIUM CHLORIDE 0.9 % IV SOLN
630.0000 mg | Freq: Once | INTRAVENOUS | Status: AC
  Administered 2017-05-16: 630 mg via INTRAVENOUS
  Filled 2017-05-16: qty 63

## 2017-05-16 MED ORDER — CYANOCOBALAMIN 1000 MCG/ML IJ SOLN
1000.0000 ug | Freq: Once | INTRAMUSCULAR | Status: AC
  Administered 2017-05-08: 1000 ug via INTRAMUSCULAR

## 2017-05-16 NOTE — Addendum Note (Signed)
Addended by: Ardeen Garland on: 05/16/2017 02:24 PM   Modules accepted: Orders

## 2017-05-16 NOTE — Progress Notes (Signed)
Vitamin b 12 given on 05/08/17.

## 2017-05-16 NOTE — Patient Instructions (Signed)
[facility] Discharge Instructions for Patients Receiving Chemotherapy  Today you received the following chemotherapy agents: Keytruda, Alimta, Carboplatin    To help prevent nausea and vomiting after your treatment, we encourage you to take your nausea medication as prescribed.    If you develop nausea and vomiting that is not controlled by your nausea medication, call the clinic.   BELOW ARE SYMPTOMS THAT SHOULD BE REPORTED IMMEDIATELY:  *FEVER GREATER THAN 100.5 F  *CHILLS WITH OR WITHOUT FEVER  NAUSEA AND VOMITING THAT IS NOT CONTROLLED WITH YOUR NAUSEA MEDICATION  *UNUSUAL SHORTNESS OF BREATH  *UNUSUAL BRUISING OR BLEEDING  TENDERNESS IN MOUTH AND THROAT WITH OR WITHOUT PRESENCE OF ULCERS  *URINARY PROBLEMS  *BOWEL PROBLEMS  UNUSUAL RASH Items with * indicate a potential emergency and should be followed up as soon as possible.  Feel free to call the clinic should you have any questions or concerns. The clinic phone number is [phone].  Please show the [place] at check-in to the Emergency Department and triage nurse.  Pembrolizumab injection What is this medicine? PEMBROLIZUMAB (pem broe liz ue mab) is a monoclonal antibody. It is used to treat melanoma, head and neck cancer, Hodgkin lymphoma, non-small cell lung cancer, urothelial cancer, stomach cancer, and cancers that have a certain genetic condition. This medicine may be used for other purposes; ask your health care provider or pharmacist if you have questions. COMMON BRAND NAME(S): Keytruda What should I tell my health care provider before I take this medicine? They need to know if you have any of these conditions: -diabetes -immune system problems -inflammatory bowel disease -liver disease -lung or breathing disease -lupus -organ transplant -an unusual or allergic reaction to pembrolizumab, other medicines, foods, dyes, or preservatives -pregnant or trying to get  pregnant -breast-feeding How should I use this medicine? This medicine is for infusion into a vein. It is given by a health care professional in a hospital or clinic setting. A special MedGuide will be given to you before each treatment. Be sure to read this information carefully each time. Talk to your pediatrician regarding the use of this medicine in children. While this drug may be prescribed for selected conditions, precautions do apply. Overdosage: If you think you have taken too much of this medicine contact a poison control center or emergency room at once. NOTE: This medicine is only for you. Do not share this medicine with others. What if I miss a dose? It is important not to miss your dose. Call your doctor or health care professional if you are unable to keep an appointment. What may interact with this medicine? Interactions have not been studied. Give your health care provider a list of all the medicines, herbs, non-prescription drugs, or dietary supplements you use. Also tell them if you smoke, drink alcohol, or use illegal drugs. Some items may interact with your medicine. This list may not describe all possible interactions. Give your health care provider a list of all the medicines, herbs, non-prescription drugs, or dietary supplements you use. Also tell them if you smoke, drink alcohol, or use illegal drugs. Some items may interact with your medicine. What should I watch for while using this medicine? Your condition will be monitored carefully while you are receiving this medicine. You may need blood work done while you are taking this medicine. Do not become pregnant while taking this medicine or for 4 months after stopping it. Women should inform their doctor if they wish to become  pregnant or think they might be pregnant. There is a potential for serious side effects to an unborn child. Talk to your health care professional or pharmacist for more information. Do not breast-feed  an infant while taking this medicine or for 4 months after the last dose. What side effects may I notice from receiving this medicine? Side effects that you should report to your doctor or health care professional as soon as possible: -allergic reactions like skin rash, itching or hives, swelling of the face, lips, or tongue -bloody or black, tarry -breathing problems -changes in vision -chest pain -chills -constipation -cough -dizziness or feeling faint or lightheaded -fast or irregular heartbeat -fever -flushing -hair loss -low blood counts - this medicine may decrease the number of white blood cells, red blood cells and platelets. You may be at increased risk for infections and bleeding. -muscle pain -muscle weakness -persistent headache -signs and symptoms of high blood sugar such as dizziness; dry mouth; dry skin; fruity breath; nausea; stomach pain; increased hunger or thirst; increased urination -signs and symptoms of kidney injury like trouble passing urine or change in the amount of urine -signs and symptoms of liver injury like dark urine, light-colored stools, loss of appetite, nausea, right upper belly pain, yellowing of the eyes or skin -stomach pain -sweating -weight loss Side effects that usually do not require medical attention (report to your doctor or health care professional if they continue or are bothersome): -decreased appetite -diarrhea -tiredness This list may not describe all possible side effects. Call your doctor for medical advice about side effects. You may report side effects to FDA at 1-800-FDA-1088. Where should I keep my medicine? This drug is given in a hospital or clinic and will not be stored at home. NOTE: This sheet is a summary. It may not cover all possible information. If you have questions about this medicine, talk to your doctor, pharmacist, or health care provider.  2018 Elsevier/Gold Standard (2016-04-11 12:29:36) Pemetrexed  injection What is this medicine? PEMETREXED (PEM e TREX ed) is a chemotherapy drug used to treat lung cancers like non-small cell lung cancer and mesothelioma. It may also be used to treat other cancers. This medicine may be used for other purposes; ask your health care provider or pharmacist if you have questions. COMMON BRAND NAME(S): Alimta What should I tell my health care provider before I take this medicine? They need to know if you have any of these conditions: -infection (especially a virus infection such as chickenpox, cold sores, or herpes) -kidney disease -low blood counts, like low white cell, platelet, or red cell counts -lung or breathing disease, like asthma -radiation therapy -an unusual or allergic reaction to pemetrexed, other medicines, foods, dyes, or preservative -pregnant or trying to get pregnant -breast-feeding How should I use this medicine? This drug is given as an infusion into a vein. It is administered in a hospital or clinic by a specially trained health care professional. Talk to your pediatrician regarding the use of this medicine in children. Special care may be needed. Overdosage: If you think you have taken too much of this medicine contact a poison control center or emergency room at once. NOTE: This medicine is only for you. Do not share this medicine with others. What if I miss a dose? It is important not to miss your dose. Call your doctor or health care professional if you are unable to keep an appointment. What may interact with this medicine? This medicine may interact with the following  medications: -Ibuprofen This list may not describe all possible interactions. Give your health care provider a list of all the medicines, herbs, non-prescription drugs, or dietary supplements you use. Also tell them if you smoke, drink alcohol, or use illegal drugs. Some items may interact with your medicine. What should I watch for while using this medicine? Visit  your doctor for checks on your progress. This drug may make you feel generally unwell. This is not uncommon, as chemotherapy can affect healthy cells as well as cancer cells. Report any side effects. Continue your course of treatment even though you feel ill unless your doctor tells you to stop. In some cases, you may be given additional medicines to help with side effects. Follow all directions for their use. Call your doctor or health care professional for advice if you get a fever, chills or sore throat, or other symptoms of a cold or flu. Do not treat yourself. This drug decreases your body's ability to fight infections. Try to avoid being around people who are sick. This medicine may increase your risk to bruise or bleed. Call your doctor or health care professional if you notice any unusual bleeding. Be careful brushing and flossing your teeth or using a toothpick because you may get an infection or bleed more easily. If you have any dental work done, tell your dentist you are receiving this medicine. Avoid taking products that contain aspirin, acetaminophen, ibuprofen, naproxen, or ketoprofen unless instructed by your doctor. These medicines may hide a fever. Call your doctor or health care professional if you get diarrhea or mouth sores. Do not treat yourself. To protect your kidneys, drink water or other fluids as directed while you are taking this medicine. Do not become pregnant while taking this medicine or for 6 months after stopping it. Women should inform their doctor if they wish to become pregnant or think they might be pregnant. Men should not father a child while taking this medicine and for 3 months after stopping it. This may interfere with the ability to father a child. You should talk to your doctor or health care professional if you are concerned about your fertility. There is a potential for serious side effects to an unborn child. Talk to your health care professional or pharmacist  for more information. Do not breast-feed an infant while taking this medicine or for 1 week after stopping it. What side effects may I notice from receiving this medicine? Side effects that you should report to your doctor or health care professional as soon as possible: -allergic reactions like skin rash, itching or hives, swelling of the face, lips, or tongue -breathing problems -redness, blistering, peeling or loosening of the skin, including inside the mouth -signs and symptoms of bleeding such as bloody or black, tarry stools; red or dark-brown urine; spitting up blood or brown material that looks like coffee grounds; red spots on the skin; unusual bruising or bleeding from the eye, gums, or nose -signs and symptoms of infection like fever or chills; cough; sore throat; pain or trouble passing urine -signs and symptoms of kidney injury like trouble passing urine or change in the amount of urine -signs and symptoms of liver injury like dark yellow or brown urine; general ill feeling or flu-like symptoms; light-colored stools; loss of appetite; nausea; right upper belly pain; unusually weak or tired; yellowing of the eyes or skin Side effects that usually do not require medical attention (report to your doctor or health care professional if they continue  or are bothersome): -constipation -dizziness -mouth sores -nausea, vomiting -pain, tingling, numbness in the hands or feet -unusually weak or tired This list may not describe all possible side effects. Call your doctor for medical advice about side effects. You may report side effects to FDA at 1-800-FDA-1088. Where should I keep my medicine? This drug is given in a hospital or clinic and will not be stored at home. NOTE: This sheet is a summary. It may not cover all possible information. If you have questions about this medicine, talk to your doctor, pharmacist, or health care provider.  2018 Elsevier/Gold Standard (2016-05-02  18:51:46) Carboplatin injection What is this medicine? CARBOPLATIN (KAR boe pla tin) is a chemotherapy drug. It targets fast dividing cells, like cancer cells, and causes these cells to die. This medicine is used to treat ovarian cancer and many other cancers. This medicine may be used for other purposes; ask your health care provider or pharmacist if you have questions. COMMON BRAND NAME(S): Paraplatin What should I tell my health care provider before I take this medicine? They need to know if you have any of these conditions: -blood disorders -hearing problems -kidney disease -recent or ongoing radiation therapy -an unusual or allergic reaction to carboplatin, cisplatin, other chemotherapy, other medicines, foods, dyes, or preservatives -pregnant or trying to get pregnant -breast-feeding How should I use this medicine? This drug is usually given as an infusion into a vein. It is administered in a hospital or clinic by a specially trained health care professional. Talk to your pediatrician regarding the use of this medicine in children. Special care may be needed. Overdosage: If you think you have taken too much of this medicine contact a poison control center or emergency room at once. NOTE: This medicine is only for you. Do not share this medicine with others. What if I miss a dose? It is important not to miss a dose. Call your doctor or health care professional if you are unable to keep an appointment. What may interact with this medicine? -medicines for seizures -medicines to increase blood counts like filgrastim, pegfilgrastim, sargramostim -some antibiotics like amikacin, gentamicin, neomycin, streptomycin, tobramycin -vaccines Talk to your doctor or health care professional before taking any of these medicines: -acetaminophen -aspirin -ibuprofen -ketoprofen -naproxen This list may not describe all possible interactions. Give your health care provider a list of all the  medicines, herbs, non-prescription drugs, or dietary supplements you use. Also tell them if you smoke, drink alcohol, or use illegal drugs. Some items may interact with your medicine. What should I watch for while using this medicine? Your condition will be monitored carefully while you are receiving this medicine. You will need important blood work done while you are taking this medicine. This drug may make you feel generally unwell. This is not uncommon, as chemotherapy can affect healthy cells as well as cancer cells. Report any side effects. Continue your course of treatment even though you feel ill unless your doctor tells you to stop. In some cases, you may be given additional medicines to help with side effects. Follow all directions for their use. Call your doctor or health care professional for advice if you get a fever, chills or sore throat, or other symptoms of a cold or flu. Do not treat yourself. This drug decreases your body's ability to fight infections. Try to avoid being around people who are sick. This medicine may increase your risk to bruise or bleed. Call your doctor or health care professional if you  notice any unusual bleeding. Be careful brushing and flossing your teeth or using a toothpick because you may get an infection or bleed more easily. If you have any dental work done, tell your dentist you are receiving this medicine. Avoid taking products that contain aspirin, acetaminophen, ibuprofen, naproxen, or ketoprofen unless instructed by your doctor. These medicines may hide a fever. Do not become pregnant while taking this medicine. Women should inform their doctor if they wish to become pregnant or think they might be pregnant. There is a potential for serious side effects to an unborn child. Talk to your health care professional or pharmacist for more information. Do not breast-feed an infant while taking this medicine. What side effects may I notice from receiving this  medicine? Side effects that you should report to your doctor or health care professional as soon as possible: -allergic reactions like skin rash, itching or hives, swelling of the face, lips, or tongue -signs of infection - fever or chills, cough, sore throat, pain or difficulty passing urine -signs of decreased platelets or bleeding - bruising, pinpoint red spots on the skin, black, tarry stools, nosebleeds -signs of decreased red blood cells - unusually weak or tired, fainting spells, lightheadedness -breathing problems -changes in hearing -changes in vision -chest pain -high blood pressure -low blood counts - This drug may decrease the number of white blood cells, red blood cells and platelets. You may be at increased risk for infections and bleeding. -nausea and vomiting -pain, swelling, redness or irritation at the injection site -pain, tingling, numbness in the hands or feet -problems with balance, talking, walking -trouble passing urine or change in the amount of urine Side effects that usually do not require medical attention (report to your doctor or health care professional if they continue or are bothersome): -hair loss -loss of appetite -metallic taste in the mouth or changes in taste This list may not describe all possible side effects. Call your doctor for medical advice about side effects. You may report side effects to FDA at 1-800-FDA-1088. Where should I keep my medicine? This drug is given in a hospital or clinic and will not be stored at home. NOTE: This sheet is a summary. It may not cover all possible information. If you have questions about this medicine, talk to your doctor, pharmacist, or health care provider.  2018 Elsevier/Gold Standard (2007-10-08 14:38:05)

## 2017-05-17 ENCOUNTER — Telehealth: Payer: Self-pay | Admitting: Medical Oncology

## 2017-05-17 NOTE — Telephone Encounter (Signed)
He tolerated chemo well. He understands to call for any questions, concerns , side effects.

## 2017-05-29 ENCOUNTER — Telehealth: Payer: Self-pay | Admitting: Family Medicine

## 2017-05-29 MED ORDER — LOSARTAN POTASSIUM-HCTZ 100-25 MG PO TABS: 1.0000 | ORAL_TABLET | Freq: Every day | ORAL | 3 refills | Status: DC

## 2017-05-29 MED ORDER — VALSARTAN-HYDROCHLOROTHIAZIDE 160-25 MG PO TABS: 1.0000 | ORAL_TABLET | Freq: Every day | ORAL | 1 refills | Status: DC

## 2017-05-29 NOTE — Telephone Encounter (Signed)
Noted. Will change back to losartan hctz.

## 2017-05-29 NOTE — Telephone Encounter (Signed)
Pt    Found   Out today  About  Recall  On   losarten-hctz 100-25  Mg tabs that  Is   On  Recall       He  Took a   Dose yesterday . None   Today .   Wants  To  Know   What  To  Do .

## 2017-05-29 NOTE — Addendum Note (Signed)
Addended by: [name] on: 05/29/2017 06:20 PM   Modules accepted: Orders

## 2017-05-29 NOTE — Telephone Encounter (Addendum)
Recommend we change to diovan hct - new med sent to pharmacy. May return to losartan hctz when recall ends.

## 2017-05-29 NOTE — Telephone Encounter (Signed)
Spoke with pt relaying message per Dr. Darnell Level.  Pt states he received a call from the pharmacist telling him the med he is taking is from a different manufacturer than what is being recalled. So it is safe for him to continue taking the losartan-hctz.

## 2017-05-29 NOTE — Addendum Note (Signed)
Addended by: [name] on: 05/29/2017 05:11 PM   Modules accepted: Orders

## 2017-06-04 ENCOUNTER — Encounter: Payer: Self-pay | Admitting: Pharmacist

## 2017-06-05 ENCOUNTER — Ambulatory Visit (HOSPITAL_BASED_OUTPATIENT_CLINIC_OR_DEPARTMENT_OTHER): Payer: BLUE CROSS/BLUE SHIELD | Admitting: Internal Medicine

## 2017-06-05 ENCOUNTER — Encounter: Payer: Self-pay | Admitting: Medical Oncology

## 2017-06-05 ENCOUNTER — Encounter: Payer: Self-pay | Admitting: Internal Medicine

## 2017-06-05 ENCOUNTER — Other Ambulatory Visit (HOSPITAL_BASED_OUTPATIENT_CLINIC_OR_DEPARTMENT_OTHER): Payer: BLUE CROSS/BLUE SHIELD

## 2017-06-05 VITALS — BP 132/70 | HR 60 | Temp 98.8°F | Resp 18 | Ht 69.5 in | Wt 214.1 lb

## 2017-06-05 DIAGNOSIS — C3432 Malignant neoplasm of lower lobe, left bronchus or lung: Secondary | ICD-10-CM

## 2017-06-05 DIAGNOSIS — Z79899 Other long term (current) drug therapy: Secondary | ICD-10-CM | POA: Diagnosis not present

## 2017-06-05 DIAGNOSIS — Z923 Personal history of irradiation: Secondary | ICD-10-CM

## 2017-06-05 DIAGNOSIS — Z9221 Personal history of antineoplastic chemotherapy: Secondary | ICD-10-CM

## 2017-06-05 DIAGNOSIS — Z5111 Encounter for antineoplastic chemotherapy: Secondary | ICD-10-CM

## 2017-06-05 DIAGNOSIS — I10 Essential (primary) hypertension: Secondary | ICD-10-CM

## 2017-06-05 DIAGNOSIS — Z5112 Encounter for antineoplastic immunotherapy: Secondary | ICD-10-CM

## 2017-06-05 DIAGNOSIS — R5382 Chronic fatigue, unspecified: Secondary | ICD-10-CM

## 2017-06-05 LAB — COMPREHENSIVE METABOLIC PANEL
ALT: 35 U/L (ref 0–55)
AST: 28 U/L (ref 5–34)
Albumin: 3.6 g/dL (ref 3.5–5.0)
Alkaline Phosphatase: 96 U/L (ref 40–150)
Anion Gap: 9 mEq/L (ref 3–11)
BUN: 10.3 mg/dL (ref 7.0–26.0)
CO2: 27 mEq/L (ref 22–29)
Calcium: 9.4 mg/dL (ref 8.4–10.4)
Chloride: 103 mEq/L (ref 98–109)
Creatinine: 0.8 mg/dL (ref 0.7–1.3)
EGFR: >60 mL/min/{1.73_m2} (ref >60)
Glucose: 126 mg/dl (ref 70–140)
Potassium: 4.1 mEq/L (ref 3.5–5.1)
Sodium: 139 mEq/L (ref 136–145)
Total Bilirubin: 0.5 mg/dL (ref 0.20–1.20)
Total Protein: 7 g/dL (ref 6.4–8.3)

## 2017-06-05 LAB — CBC WITH DIFFERENTIAL/PLATELET
BASO%: 0 % (ref 0.0–2.0)
Basophils Absolute: 0 10*3/uL (ref 0.0–0.1)
EOS%: 0.8 % (ref 0.0–7.0)
Eosinophils Absolute: 0 10*3/uL (ref 0.0–0.5)
HCT: 40.4 % (ref 38.4–49.9)
HGB: 14.1 g/dL (ref 13.0–17.1)
LYMPH%: 20.2 % (ref 14.0–49.0)
MCH: 36.9 pg — ABNORMAL HIGH (ref 27.2–33.4)
MCHC: 34.9 g/dL (ref 32.0–36.0)
MCV: 105.8 fL — ABNORMAL HIGH (ref 79.3–98.0)
MONO#: 0.3 10*3/uL (ref 0.1–0.9)
MONO%: 12.2 % (ref 0.0–14.0)
NEUT#: 1.8 10*3/uL (ref 1.5–6.5)
NEUT%: 66.8 % (ref 39.0–75.0)
Platelets: 253 10*3/uL (ref 140–400)
RBC: 3.82 10*6/uL — ABNORMAL LOW (ref 4.20–5.82)
RDW: 12.9 % (ref 11.0–14.6)
WBC: 2.6 10*3/uL — ABNORMAL LOW (ref 4.0–10.3)
lymph#: 0.5 10*3/uL — ABNORMAL LOW (ref 0.9–3.3)

## 2017-06-05 LAB — TSH: TSH: 0.559 m(IU)/L (ref 0.320–4.118)

## 2017-06-05 NOTE — Progress Notes (Signed)
[name] [phone]   [phone]  OFFICE PROGRESS NOTE  [name], MD [place] [address]  DIAGNOSIS: recurrent non-small cell lung cancer initially diagnosed as Stage IIIB (T1b, N3, M0) non-small cell lung cancer favoring adenocarcinoma presented with left lower lobe pulmonary nodule in addition to left hilar and bilateral mediastinal lymphadenopathy in addition to right supraclavicular lymph nodes diagnosed in April 2017.  PRIOR THERAPY: 1) Concurrent chemoradiation with weekly carboplatin for AUC of 2 and paclitaxel 45 mg/M2 started on 11/15/2015. He is status post 6 cycles. Last dose was getting 12/20/2015 with partial response. 2) Consolidation chemotherapy with carboplatin for AUC of 5 and paclitaxel 175 MG/M2 every 3 weeks with Neulasta support. Status post 3 cycles. Last dose was given 04/03/2016.  CURRENT THERAPY: systemic chemotherapy with carboplatin for AUC of 5, Alimta 500 MG/M2 and Ketruda 200 MG IV every 3 weeks. First dose 05/16/2017.  Status post 1 cycle.  INTERVAL HISTORY: Brian White 66 y.o. male returns to the clinic today for follow-up visit.  The patient tolerated the first cycle of his systemic chemotherapy with carboplatin, Alimta and Keytruda fairly well.  He denied having any chest pain, shortness of breath, cough or hemoptysis.  He denied having any recent weight loss or night sweats.  He has no nausea, vomiting, diarrhea or constipation.  He is here today for evaluation before starting cycle #2.  MEDICAL HISTORY: Past Medical History:  Diagnosis Date  . Adenocarcinoma, lung ([place]) 10/08/2015   2.4cm spiculated LLL nodule suspicious for primary bronchogenic carcinoma by CT 09/2015 - adenocarcinoma by endobronchial biopsy  . Allergy   . Arthritis   . CAD (coronary artery disease)    mild MI; pt unaware - seen on stress test  . COPD (chronic obstructive pulmonary disease) ([place]) 06/26/2011     Mild centrilobular and paraseptal emphysema by CT 09/2015   . ED (erectile dysfunction)   . H/O anaphylactic shock 2013   hives,hypotension,syncope  . H/O seasonal allergies   . Herpes zoster 12/06/2015  . Hypertension   . Polycythemia 01/25/2014  . Prostate cancer [facility]) 2013   s/p radiation therapy March 2013, planned f/u with uro after XRT  . Radiation 09/18/2011 through 11/10/2011   Prostate 7600 cGy 40 sessions, seminal vesicles 5600 cGy 40 sessions    . Radiation 11/10/15-12/22/15   chest 60 Gy  . Shortness of breath dyspnea   . Smoker   . Thoracic aorta atherosclerosis ([place]) 10/08/2015   Mild by CT 09/2015     ALLERGIES:  is allergic to onion.  MEDICATIONS:  Current Outpatient Medications  Medication Sig Dispense Refill  . aspirin 81 MG tablet Take 81 mg by mouth daily. Reported on 12/21/2015    . budesonide-formoterol (SYMBICORT) 160-4.5 MCG/ACT inhaler Inhale 1 puff into the lungs 2 (two) times daily. 3 Inhaler 3  . cetirizine (ZYRTEC) 10 MG chewable tablet Chew 10 mg by mouth daily.    Marland Kitchen dexamethasone (DECADRON) 4 MG tablet 4 mg by mouth twice a day the day before, day of and after the chemotherapy every 3 weeks. 40 tablet 0  . folic acid (FOLVITE) 1 MG tablet Take 1 tablet (1 mg total) by mouth daily. 30 tablet 4  . guaiFENesin (MUCINEX) 600 MG 12 hr tablet Take by mouth 2 (two) times daily.    Marland Kitchen ibuprofen (ADVIL,MOTRIN) 200 MG tablet Take 600 mg by mouth every 6 (six) hours as needed (Pain). Reported on 12/21/2015    .  losartan-hydrochlorothiazide (HYZAAR) 100-25 MG tablet Take 1 tablet daily by mouth. 90 tablet 3  . prochlorperazine (COMPAZINE) 10 MG tablet Take 1 tablet (10 mg total) by mouth every 6 (six) hours as needed for nausea or vomiting. 30 tablet 0  . valsartan-hydrochlorothiazide (DIOVAN-HCT) 160-25 MG tablet   0   No current facility-administered medications for this visit.     SURGICAL HISTORY:  Past Surgical History:  Procedure Laterality  Date  . COLONOSCOPY W/ BIOPSIES AND POLYPECTOMY  10/11/15   mult TAs, 3cm rectal polypoid lesion; rec further treatment, diverticulosis ([facility])  . ENDOBRONCHIAL ULTRASOUND Bilateral 10/18/2015   Performed by Javier Glazier, MD at [facility]  . HERNIA REPAIR Right 2005   with mesh  . INSERTION PROSTATE RADIATION SEED  08/2011   prostate cancer, [facility]    REVIEW OF SYSTEMS:  A comprehensive review of systems was negative.   PHYSICAL EXAMINATION: General appearance: alert, cooperative and no distress Head: Normocephalic, without obvious abnormality, atraumatic Neck: no adenopathy, no JVD, supple, symmetrical, trachea midline and thyroid not enlarged, symmetric, no tenderness/mass/nodules Lymph nodes: Cervical, supraclavicular, and axillary nodes normal. Resp: clear to auscultation bilaterally Back: symmetric, no curvature. ROM normal. No CVA tenderness. Cardio: regular rate and rhythm, S1, S2 normal, no murmur, click, rub or gallop GI: soft, non-tender; bowel sounds normal; no masses,  no organomegaly Extremities: extremities normal, atraumatic, no cyanosis or edema  ECOG PERFORMANCE STATUS: 1 - Symptomatic but completely ambulatory  Blood pressure 132/70, pulse 60, temperature 98.8 F (37.1 C), temperature source Oral, resp. rate 18, height 5' 9.5" (1.765 m), weight 214 lb 1.6 oz (97.1 kg), SpO2 100 %.  LABORATORY DATA: Lab Results  Component Value Date   WBC 2.6 (L) 06/05/2017   HGB 14.1 06/05/2017   HCT 40.4 06/05/2017   MCV 105.8 (H) 06/05/2017   PLT 253 06/05/2017      Chemistry      Component Value Date/Time   NA 136 05/16/2017 1301   K 3.9 05/16/2017 1301   CL 99 08/27/2015 0946   CO2 24 05/16/2017 1301   BUN 12.1 05/16/2017 1301   CREATININE 0.8 05/16/2017 1301      Component Value Date/Time   CALCIUM 9.7 05/16/2017 1301   ALKPHOS 78 05/16/2017 1301   AST 18 05/16/2017 1301   ALT 14 05/16/2017 1301   BILITOT 0.62 05/16/2017 1301        RADIOGRAPHIC STUDIES: No results found.  ASSESSMENT AND PLAN:  This is a very pleasant 66 years old white male with recurrent non-small cell lung cancer initially diagnosed as stage IIIB non-small cell lung cancer favoring adenocarcinoma status post concurrent chemoradiation followed by consolidation chemotherapy. His last treatment was in September 2017. Recent imaging studies showed evidence for disease recurrence. The patient is currently undergoing systemic chemotherapy with carboplatin, Alimta and Keytruda status post 1 cycle and tolerated the first cycle of this treatment fairly well. I recommended for him to proceed with cycle #2 as  scheduled. I will see him back for follow-up visit in 3 weeks for evaluation before starting cycle #3. The patient was advised to call immediately if he has any concerning symptoms in the interval. The patient voices understanding of current disease status and treatment options and is in agreement with the current care plan. All questions were answered. The patient knows to call the clinic with any problems, questions or concerns. We can certainly see the patient much sooner if necessary.  Disclaimer: This note was dictated with voice recognition  software. Similar sounding words can inadvertently be transcribed and may not be corrected upon review.

## 2017-06-06 ENCOUNTER — Ambulatory Visit (HOSPITAL_BASED_OUTPATIENT_CLINIC_OR_DEPARTMENT_OTHER): Payer: BLUE CROSS/BLUE SHIELD

## 2017-06-06 ENCOUNTER — Other Ambulatory Visit: Payer: Self-pay | Admitting: Medical Oncology

## 2017-06-06 VITALS — BP 123/74 | HR 58 | Temp 98.4°F | Resp 18

## 2017-06-06 DIAGNOSIS — Z5111 Encounter for antineoplastic chemotherapy: Secondary | ICD-10-CM | POA: Diagnosis not present

## 2017-06-06 DIAGNOSIS — Z5112 Encounter for antineoplastic immunotherapy: Secondary | ICD-10-CM

## 2017-06-06 DIAGNOSIS — C3432 Malignant neoplasm of lower lobe, left bronchus or lung: Secondary | ICD-10-CM | POA: Diagnosis not present

## 2017-06-06 MED ORDER — SODIUM CHLORIDE 0.9 % IV SOLN
200.0000 mg | Freq: Once | INTRAVENOUS | Status: AC
  Administered 2017-06-06: 200 mg via INTRAVENOUS
  Filled 2017-06-06: qty 8

## 2017-06-06 MED ORDER — SODIUM CHLORIDE 0.9 % IV SOLN
630.0000 mg | Freq: Once | INTRAVENOUS | Status: AC
  Administered 2017-06-06: 630 mg via INTRAVENOUS
  Filled 2017-06-06: qty 63

## 2017-06-06 MED ORDER — SODIUM CHLORIDE 0.9 % IV SOLN
Freq: Once | INTRAVENOUS | Status: AC
  Administered 2017-06-06: 10:00:00 via INTRAVENOUS
  Filled 2017-06-06: qty 5

## 2017-06-06 MED ORDER — PALONOSETRON HCL INJECTION 0.25 MG/5ML
0.2500 mg | Freq: Once | INTRAVENOUS | Status: AC
  Administered 2017-06-06: 0.25 mg via INTRAVENOUS

## 2017-06-06 MED ORDER — CARBOPLATIN CHEMO INTRADERMAL TEST DOSE 100MCG/0.02ML
100.0000 ug | Freq: Once | INTRADERMAL | Status: AC
  Administered 2017-06-06: 100 ug via INTRADERMAL
  Filled 2017-06-06: qty 0.02

## 2017-06-06 MED ORDER — SODIUM CHLORIDE 0.9 % IV SOLN
Freq: Once | INTRAVENOUS | Status: AC
  Administered 2017-06-06: 09:00:00 via INTRAVENOUS

## 2017-06-06 MED ORDER — SODIUM CHLORIDE 0.9 % IV SOLN
500.0000 mg/m2 | Freq: Once | INTRAVENOUS | Status: AC
  Administered 2017-06-06: 1100 mg via INTRAVENOUS
  Filled 2017-06-06: qty 40

## 2017-06-06 MED ORDER — PALONOSETRON HCL INJECTION 0.25 MG/5ML
INTRAVENOUS | Status: AC
  Filled 2017-06-06: qty 5

## 2017-06-06 NOTE — Patient Instructions (Signed)
[facility] Discharge Instructions for Patients Receiving Chemotherapy  Today you received the following chemotherapy agents: Keytruda, Alimta and Carboplatin.   To help prevent nausea and vomiting after your treatment, we encourage you to take your nausea medication as prescribed.    If you develop nausea and vomiting that is not controlled by your nausea medication, call the clinic.   BELOW ARE SYMPTOMS THAT SHOULD BE REPORTED IMMEDIATELY:  *FEVER GREATER THAN 100.5 F  *CHILLS WITH OR WITHOUT FEVER  NAUSEA AND VOMITING THAT IS NOT CONTROLLED WITH YOUR NAUSEA MEDICATION  *UNUSUAL SHORTNESS OF BREATH  *UNUSUAL BRUISING OR BLEEDING  TENDERNESS IN MOUTH AND THROAT WITH OR WITHOUT PRESENCE OF ULCERS  *URINARY PROBLEMS  *BOWEL PROBLEMS  UNUSUAL RASH Items with * indicate a potential emergency and should be followed up as soon as possible.  Feel free to call the clinic should you have any questions or concerns. The clinic phone number is [phone].  Please show the [facility] at check-in to the Emergency Department and triage nurse.

## 2017-06-11 ENCOUNTER — Telehealth: Payer: Self-pay | Admitting: Internal Medicine

## 2017-06-11 ENCOUNTER — Other Ambulatory Visit: Payer: Self-pay | Admitting: Medical Oncology

## 2017-06-11 ENCOUNTER — Telehealth: Payer: Self-pay | Admitting: Medical Oncology

## 2017-06-11 NOTE — Telephone Encounter (Signed)
Spoke with  Patient re lab 11/27. Per patient lab moved from 11/28 to 11/27 due to he is going out of town. Also patient thaught all appointments would be cancelled for next three weeks.  Per 11/20 los previously scheduled lab/ct and 12/5 visit with MM were to be cancelled, however per the same los patient is to have weekly lab as long as he is receiving treatment. Patient was treated last week.   Message to desk nurse to check/confirm order - patient aware and informed we would call should appointments need to be changed.

## 2017-06-11 NOTE — Telephone Encounter (Signed)
I told pt to keep weekly labs as scheduled.He voiced understanding.

## 2017-06-12 ENCOUNTER — Other Ambulatory Visit (HOSPITAL_BASED_OUTPATIENT_CLINIC_OR_DEPARTMENT_OTHER): Payer: BLUE CROSS/BLUE SHIELD

## 2017-06-12 DIAGNOSIS — C3432 Malignant neoplasm of lower lobe, left bronchus or lung: Secondary | ICD-10-CM | POA: Diagnosis not present

## 2017-06-12 LAB — CBC WITH DIFFERENTIAL/PLATELET
BASO%: 0 % (ref 0.0–2.0)
Basophils Absolute: 0 10*3/uL (ref 0.0–0.1)
EOS%: 0.7 % (ref 0.0–7.0)
Eosinophils Absolute: 0 10*3/uL (ref 0.0–0.5)
HCT: 39 % (ref 38.4–49.9)
HGB: 13.7 g/dL (ref 13.0–17.1)
LYMPH%: 30.6 % (ref 14.0–49.0)
MCH: 37 pg — ABNORMAL HIGH (ref 27.2–33.4)
MCHC: 35.1 g/dL (ref 32.0–36.0)
MCV: 105.4 fL — ABNORMAL HIGH (ref 79.3–98.0)
MONO#: 0.1 10*3/uL (ref 0.1–0.9)
MONO%: 2.2 % (ref 0.0–14.0)
NEUT#: 1.9 10*3/uL (ref 1.5–6.5)
NEUT%: 66.5 % (ref 39.0–75.0)
Platelets: 152 10*3/uL (ref 140–400)
RBC: 3.7 10*6/uL — ABNORMAL LOW (ref 4.20–5.82)
RDW: 12.8 % (ref 11.0–14.6)
WBC: 2.8 10*3/uL — ABNORMAL LOW (ref 4.0–10.3)
lymph#: 0.9 10*3/uL (ref 0.9–3.3)

## 2017-06-12 LAB — COMPREHENSIVE METABOLIC PANEL
ALT: 26 U/L (ref 0–55)
AST: 23 U/L (ref 5–34)
Albumin: 3.5 g/dL (ref 3.5–5.0)
Alkaline Phosphatase: 74 U/L (ref 40–150)
Anion Gap: 7 mEq/L (ref 3–11)
BUN: 16.8 mg/dL (ref 7.0–26.0)
CO2: 30 mEq/L — ABNORMAL HIGH (ref 22–29)
Calcium: 9.7 mg/dL (ref 8.4–10.4)
Chloride: 101 mEq/L (ref 98–109)
Creatinine: 0.8 mg/dL (ref 0.7–1.3)
EGFR: >60 mL/min/{1.73_m2} (ref >60)
Glucose: 97 mg/dl (ref 70–140)
Potassium: 4.9 mEq/L (ref 3.5–5.1)
Sodium: 139 mEq/L (ref 136–145)
Total Bilirubin: 1.02 mg/dL (ref 0.20–1.20)
Total Protein: 6.6 g/dL (ref 6.4–8.3)

## 2017-06-13 ENCOUNTER — Other Ambulatory Visit: Payer: BLUE CROSS/BLUE SHIELD

## 2017-06-13 ENCOUNTER — Ambulatory Visit (HOSPITAL_COMMUNITY): Payer: BLUE CROSS/BLUE SHIELD

## 2017-06-13 ENCOUNTER — Encounter (HOSPITAL_COMMUNITY): Payer: Self-pay

## 2017-06-20 ENCOUNTER — Ambulatory Visit: Payer: BLUE CROSS/BLUE SHIELD | Admitting: Internal Medicine

## 2017-06-20 ENCOUNTER — Other Ambulatory Visit: Payer: Medicare Other

## 2017-06-21 ENCOUNTER — Other Ambulatory Visit (HOSPITAL_BASED_OUTPATIENT_CLINIC_OR_DEPARTMENT_OTHER): Payer: BLUE CROSS/BLUE SHIELD

## 2017-06-21 DIAGNOSIS — C3432 Malignant neoplasm of lower lobe, left bronchus or lung: Secondary | ICD-10-CM | POA: Diagnosis not present

## 2017-06-21 LAB — COMPREHENSIVE METABOLIC PANEL
ALT: 29 U/L (ref 0–55)
AST: 27 U/L (ref 5–34)
Albumin: 3.7 g/dL (ref 3.5–5.0)
Alkaline Phosphatase: 92 U/L (ref 40–150)
Anion Gap: 9 mEq/L (ref 3–11)
BUN: 11 mg/dL (ref 7.0–26.0)
CO2: 27 mEq/L (ref 22–29)
Calcium: 9.2 mg/dL (ref 8.4–10.4)
Chloride: 102 mEq/L (ref 98–109)
Creatinine: 0.8 mg/dL (ref 0.7–1.3)
EGFR: >60 mL/min/{1.73_m2} (ref >60)
Glucose: 110 mg/dl (ref 70–140)
Potassium: 4.1 mEq/L (ref 3.5–5.1)
Sodium: 138 mEq/L (ref 136–145)
Total Bilirubin: 0.78 mg/dL (ref 0.20–1.20)
Total Protein: 6.7 g/dL (ref 6.4–8.3)

## 2017-06-21 LAB — CBC WITH DIFFERENTIAL/PLATELET
BASO%: 0 % (ref 0.0–2.0)
Basophils Absolute: 0 10*3/uL (ref 0.0–0.1)
EOS%: 1.8 % (ref 0.0–7.0)
Eosinophils Absolute: 0 10*3/uL (ref 0.0–0.5)
HCT: 33.9 % — ABNORMAL LOW (ref 38.4–49.9)
HGB: 12.1 g/dL — ABNORMAL LOW (ref 13.0–17.1)
LYMPH%: 45.5 % (ref 14.0–49.0)
MCH: 36.7 pg — ABNORMAL HIGH (ref 27.2–33.4)
MCHC: 35.7 g/dL (ref 32.0–36.0)
MCV: 102.7 fL — ABNORMAL HIGH (ref 79.3–98.0)
MONO#: 0.3 10*3/uL (ref 0.1–0.9)
MONO%: 18 % — ABNORMAL HIGH (ref 0.0–14.0)
NEUT#: 0.6 10*3/uL — ABNORMAL LOW (ref 1.5–6.5)
NEUT%: 34.7 % — ABNORMAL LOW (ref 39.0–75.0)
Platelets: 63 10*3/uL — ABNORMAL LOW (ref 140–400)
RBC: 3.3 10*6/uL — ABNORMAL LOW (ref 4.20–5.82)
RDW: 12.8 % (ref 11.0–14.6)
WBC: 1.7 10*3/uL — ABNORMAL LOW (ref 4.0–10.3)
lymph#: 0.8 10*3/uL — ABNORMAL LOW (ref 0.9–3.3)

## 2017-06-27 ENCOUNTER — Other Ambulatory Visit (HOSPITAL_BASED_OUTPATIENT_CLINIC_OR_DEPARTMENT_OTHER): Payer: BLUE CROSS/BLUE SHIELD

## 2017-06-27 ENCOUNTER — Telehealth: Payer: Self-pay | Admitting: Oncology

## 2017-06-27 ENCOUNTER — Ambulatory Visit (HOSPITAL_BASED_OUTPATIENT_CLINIC_OR_DEPARTMENT_OTHER): Payer: BLUE CROSS/BLUE SHIELD | Admitting: Oncology

## 2017-06-27 ENCOUNTER — Ambulatory Visit (HOSPITAL_BASED_OUTPATIENT_CLINIC_OR_DEPARTMENT_OTHER): Payer: BLUE CROSS/BLUE SHIELD

## 2017-06-27 ENCOUNTER — Encounter: Payer: Self-pay | Admitting: Oncology

## 2017-06-27 VITALS — BP 130/53 | HR 70 | Temp 98.5°F | Resp 20 | Wt 211.1 lb

## 2017-06-27 DIAGNOSIS — Z79899 Other long term (current) drug therapy: Secondary | ICD-10-CM

## 2017-06-27 DIAGNOSIS — Z5112 Encounter for antineoplastic immunotherapy: Secondary | ICD-10-CM

## 2017-06-27 DIAGNOSIS — I2510 Atherosclerotic heart disease of native coronary artery without angina pectoris: Secondary | ICD-10-CM

## 2017-06-27 DIAGNOSIS — C3432 Malignant neoplasm of lower lobe, left bronchus or lung: Secondary | ICD-10-CM

## 2017-06-27 DIAGNOSIS — Z5111 Encounter for antineoplastic chemotherapy: Secondary | ICD-10-CM

## 2017-06-27 DIAGNOSIS — R5382 Chronic fatigue, unspecified: Secondary | ICD-10-CM

## 2017-06-27 LAB — CBC WITH DIFFERENTIAL/PLATELET
BASO%: 0 % (ref 0.0–2.0)
Basophils Absolute: 0 10*3/uL (ref 0.0–0.1)
EOS%: 0 % (ref 0.0–7.0)
Eosinophils Absolute: 0 10*3/uL (ref 0.0–0.5)
HCT: 35.2 % — ABNORMAL LOW (ref 38.4–49.9)
HGB: 12.2 g/dL — ABNORMAL LOW (ref 13.0–17.1)
LYMPH%: 22.8 % (ref 14.0–49.0)
MCH: 36.3 pg — ABNORMAL HIGH (ref 27.2–33.4)
MCHC: 34.7 g/dL (ref 32.0–36.0)
MCV: 104.8 fL — ABNORMAL HIGH (ref 79.3–98.0)
MONO#: 0.4 10*3/uL (ref 0.1–0.9)
MONO%: 15.2 % — ABNORMAL HIGH (ref 0.0–14.0)
NEUT#: 1.5 10*3/uL (ref 1.5–6.5)
NEUT%: 62 % (ref 39.0–75.0)
Platelets: 218 10*3/uL (ref 140–400)
RBC: 3.36 10*6/uL — ABNORMAL LOW (ref 4.20–5.82)
RDW: 13.8 % (ref 11.0–14.6)
WBC: 2.4 10*3/uL — ABNORMAL LOW (ref 4.0–10.3)
lymph#: 0.5 10*3/uL — ABNORMAL LOW (ref 0.9–3.3)

## 2017-06-27 LAB — COMPREHENSIVE METABOLIC PANEL
ALT: 21 U/L (ref 0–55)
AST: 21 U/L (ref 5–34)
Albumin: 3.8 g/dL (ref 3.5–5.0)
Alkaline Phosphatase: 83 U/L (ref 40–150)
Anion Gap: 12 mEq/L — ABNORMAL HIGH (ref 3–11)
BUN: 10.7 mg/dL (ref 7.0–26.0)
CO2: 26 mEq/L (ref 22–29)
Calcium: 9.7 mg/dL (ref 8.4–10.4)
Chloride: 100 mEq/L (ref 98–109)
Creatinine: 0.8 mg/dL (ref 0.7–1.3)
EGFR: >60 mL/min/{1.73_m2} (ref >60)
Glucose: 150 mg/dl — ABNORMAL HIGH (ref 70–140)
Potassium: 4 mEq/L (ref 3.5–5.1)
Sodium: 138 mEq/L (ref 136–145)
Total Bilirubin: 0.49 mg/dL (ref 0.20–1.20)
Total Protein: 7 g/dL (ref 6.4–8.3)

## 2017-06-27 LAB — TSH: TSH: 0.349 m(IU)/L (ref 0.320–4.118)

## 2017-06-27 LAB — RESEARCH LABS

## 2017-06-27 MED ORDER — PALONOSETRON HCL INJECTION 0.25 MG/5ML
INTRAVENOUS | Status: AC
  Filled 2017-06-27: qty 5

## 2017-06-27 MED ORDER — DIPHENHYDRAMINE HCL 50 MG/ML IJ SOLN
50.0000 mg | Freq: Once | INTRAMUSCULAR | Status: AC
  Administered 2017-06-27: 50 mg via INTRAVENOUS

## 2017-06-27 MED ORDER — DIPHENHYDRAMINE HCL 50 MG/ML IJ SOLN
INTRAMUSCULAR | Status: AC
  Filled 2017-06-27: qty 1

## 2017-06-27 MED ORDER — SODIUM CHLORIDE 0.9 % IV SOLN
Freq: Once | INTRAVENOUS | Status: AC
  Administered 2017-06-27: 10:00:00 via INTRAVENOUS
  Filled 2017-06-27: qty 5

## 2017-06-27 MED ORDER — PALONOSETRON HCL INJECTION 0.25 MG/5ML
0.2500 mg | Freq: Once | INTRAVENOUS | Status: AC
  Administered 2017-06-27: 0.25 mg via INTRAVENOUS

## 2017-06-27 MED ORDER — SODIUM CHLORIDE 0.9 % IV SOLN
200.0000 mg | Freq: Once | INTRAVENOUS | Status: AC
  Administered 2017-06-27: 200 mg via INTRAVENOUS
  Filled 2017-06-27: qty 8

## 2017-06-27 MED ORDER — SODIUM CHLORIDE 0.9 % IV SOLN
500.0000 mg/m2 | Freq: Once | INTRAVENOUS | Status: AC
  Administered 2017-06-27: 1100 mg via INTRAVENOUS
  Filled 2017-06-27: qty 4

## 2017-06-27 MED ORDER — FAMOTIDINE IN NACL 20-0.9 MG/50ML-% IV SOLN
INTRAVENOUS | Status: AC
  Filled 2017-06-27: qty 50

## 2017-06-27 MED ORDER — FAMOTIDINE IN NACL 20-0.9 MG/50ML-% IV SOLN
20.0000 mg | Freq: Once | INTRAVENOUS | Status: AC
  Administered 2017-06-27: 20 mg via INTRAVENOUS

## 2017-06-27 MED ORDER — SODIUM CHLORIDE 0.9 % IV SOLN
630.5000 mg | Freq: Once | INTRAVENOUS | Status: AC
  Administered 2017-06-27: 630.5 mg via INTRAVENOUS
  Filled 2017-06-27: qty 63.05

## 2017-06-27 MED ORDER — SODIUM CHLORIDE 0.9 % IV SOLN
Freq: Once | INTRAVENOUS | Status: AC
  Administered 2017-06-27: 09:00:00 via INTRAVENOUS

## 2017-06-27 NOTE — Assessment & Plan Note (Signed)
This is a very pleasant 66 years old white male with recurrent non-small cell lung cancer initially diagnosed as stage IIIB non-small cell lung cancer favoring adenocarcinoma status post concurrent chemoradiation followed by consolidation chemotherapy. His last treatment was in September 2017. Recent imaging studies showed evidence for disease recurrence. The patient is currently undergoing systemic chemotherapy with carboplatin, Alimta and Keytruda status post 2 cycles and tolerating treatment fairly well. I recommended for him to proceed with cycle #3 as  scheduled.  He will continue to have weekly labs.  The patient will have a restaging CT scan of the chest prior to his next visit. I will see him back for follow-up visit in 3 weeks for evaluation before starting cycle #4 and to review his restaging CT scan results.  The patient was advised to call immediately if he has any concerning symptoms in the interval. The patient voices understanding of current disease status and treatment options and is in agreement with the current care plan. All questions were answered. The patient knows to call the clinic with any problems, questions or concerns. We can certainly see the patient much sooner if necessary.

## 2017-06-27 NOTE — Progress Notes (Signed)
[facility] OFFICE PROGRESS NOTE  [name], MD [place] [address]  DIAGNOSIS: recurrent non-small cell lung cancer initially diagnosed as Stage IIIB (T1b, N3, M0) non-small cell lung cancer favoring adenocarcinoma presented with left lower lobe pulmonary nodule in addition to left hilar and bilateral mediastinal lymphadenopathy in addition to right supraclavicular lymph nodes diagnosed in April 2017.  PRIOR THERAPY: 1) Concurrent chemoradiation with weekly carboplatin for AUC of 2 and paclitaxel 45 mg/M2 started on 11/15/2015. He is status post 6 cycles. Last dose was getting 12/20/2015 with partial response. 2) Consolidation chemotherapy with carboplatin for AUC of 5 and paclitaxel 175 MG/M2 every 3 weeks with Neulasta support. Status post 3cycles. Last dose was given 04/03/2016.  CURRENT THERAPY: systemic chemotherapy with carboplatin for AUC of 5, Alimta 500 MG/M2 and Ketruda 200 MG IV every 3 weeks. First dose 05/16/2017.  Status post 2 cycles.  INTERVAL HISTORY: Brian White 66 y.o. male returns for routine follow-up visit accompanied by his wife.  Patient is feeling fine today and has no specific complaints..  He denied having any chest pain, shortness of breath, cough or hemoptysis.  He denied having any recent weight loss or night sweats.  He has no nausea, vomiting, diarrhea or constipation.  He is here today for evaluation before starting cycle #3.  MEDICAL HISTORY: Past Medical History:  Diagnosis Date  . Adenocarcinoma, lung ([place]) 10/08/2015   2.4cm spiculated LLL nodule suspicious for primary bronchogenic carcinoma by CT 09/2015 - adenocarcinoma by endobronchial biopsy  . Allergy   . Arthritis   . CAD (coronary artery disease)    mild MI; pt unaware - seen on stress test  . COPD (chronic obstructive pulmonary disease) ([place]) 06/26/2011   Mild centrilobular and paraseptal emphysema by CT 09/2015   . ED (erectile dysfunction)   .  H/O anaphylactic shock 2013   hives,hypotension,syncope  . H/O seasonal allergies   . Herpes zoster 12/06/2015  . Hypertension   . Polycythemia 01/25/2014  . Prostate cancer [facility]) 2013   s/p radiation therapy March 2013, planned f/u with uro after XRT  . Radiation 09/18/2011 through 11/10/2011   Prostate 7600 cGy 40 sessions, seminal vesicles 5600 cGy 40 sessions    . Radiation 11/10/15-12/22/15   chest 60 Gy  . Shortness of breath dyspnea   . Smoker   . Thoracic aorta atherosclerosis ([place]) 10/08/2015   Mild by CT 09/2015     ALLERGIES:  is allergic to onion.  MEDICATIONS:  Current Outpatient Medications  Medication Sig Dispense Refill  . aspirin 81 MG tablet Take 81 mg by mouth daily. Reported on 12/21/2015    . budesonide-formoterol (SYMBICORT) 160-4.5 MCG/ACT inhaler Inhale 1 puff into the lungs 2 (two) times daily. 3 Inhaler 3  . cetirizine (ZYRTEC) 10 MG chewable tablet Chew 10 mg by mouth daily.    Marland Kitchen dexamethasone (DECADRON) 4 MG tablet 4 mg by mouth twice a day the day before, day of and after the chemotherapy every 3 weeks. 40 tablet 0  . folic acid (FOLVITE) 1 MG tablet Take 1 tablet (1 mg total) by mouth daily. 30 tablet 4  . guaiFENesin (MUCINEX) 600 MG 12 hr tablet Take by mouth 2 (two) times daily.    Marland Kitchen ibuprofen (ADVIL,MOTRIN) 200 MG tablet Take 600 mg by mouth every 6 (six) hours as needed (Pain). Reported on 12/21/2015    . losartan-hydrochlorothiazide (HYZAAR) 100-25 MG tablet Take 1 tablet daily by mouth. 90 tablet 3  .  prochlorperazine (COMPAZINE) 10 MG tablet Take 1 tablet (10 mg total) by mouth every 6 (six) hours as needed for nausea or vomiting. 30 tablet 0  . valsartan-hydrochlorothiazide (DIOVAN-HCT) 160-25 MG tablet   0   No current facility-administered medications for this visit.    Facility-Administered Medications Ordered in Other Visits  Medication Dose Route Frequency Provider Last Rate Last Dose  . CARBOplatin (PARAPLATIN) 630.5 mg in  sodium chloride 0.9 % 250 mL chemo infusion  630.5 mg Intravenous Once [name], MD      . diphenhydrAMINE (BENADRYL) injection 50 mg  50 mg Intravenous Once [name], MD      . famotidine (PEPCID) IVPB 20 mg premix  20 mg Intravenous Once [name], MD      . fosaprepitant (EMEND) 150 mg, dexamethasone (DECADRON) 12 mg in sodium chloride 0.9 % 145 mL IVPB   Intravenous Once [name], MD      . palonosetron (ALOXI) injection 0.25 mg  0.25 mg Intravenous Once [name], MD      . pembrolizumab [facility]) 200 mg in sodium chloride 0.9 % 50 mL chemo infusion  200 mg Intravenous Once [name], MD      . PEMEtrexed (ALIMTA) 1,100 mg in sodium chloride 0.9 % 100 mL chemo infusion  500 mg/m2 (Treatment Plan Recorded) Intravenous Once [name], MD        SURGICAL HISTORY:  Past Surgical History:  Procedure Laterality Date  . COLONOSCOPY W/ BIOPSIES AND POLYPECTOMY  10/11/15   mult TAs, 3cm rectal polypoid lesion; rec further treatment, diverticulosis ([place])  . ENDOBRONCHIAL ULTRASOUND Bilateral 10/18/2015   Procedure: ENDOBRONCHIAL ULTRASOUND;  Surgeon: Javier Glazier, MD;  Location: [facility];  Service: Cardiopulmonary;  Laterality: Bilateral;  . HERNIA REPAIR Right 2005   with mesh  . INSERTION PROSTATE RADIATION SEED  08/2011   prostate cancer, [facility]    REVIEW OF SYSTEMS:   Review of Systems  Constitutional: Negative for appetite change, chills, fatigue, fever and unexpected weight change.  HENT:   Negative for mouth sores, nosebleeds, sore throat and trouble swallowing.   Eyes: Negative for eye problems and icterus.  Respiratory: Negative for cough, hemoptysis, shortness of breath and wheezing.   Cardiovascular: Negative for chest pain and leg swelling.  Gastrointestinal: Negative for abdominal pain, constipation, diarrhea, nausea and vomiting.  Genitourinary: Negative for bladder incontinence, difficulty urinating, dysuria,  frequency and hematuria.   Musculoskeletal: Negative for back pain, gait problem, neck pain and neck stiffness.  Skin: Negative for itching and rash.  Neurological: Negative for dizziness, extremity weakness, gait problem, headaches, light-headedness and seizures.  Hematological: Negative for adenopathy. Does not bruise/bleed easily.  Psychiatric/Behavioral: Negative for confusion, depression and sleep disturbance. The patient is not nervous/anxious.     PHYSICAL EXAMINATION:  Blood pressure (!) 130/53, pulse 70, temperature 98.5 F (36.9 C), temperature source Oral, resp. rate 20, weight 211 lb 1.6 oz (95.8 kg), SpO2 100 %.  ECOG PERFORMANCE STATUS: 1 - Symptomatic but completely ambulatory  Physical Exam  Constitutional: Oriented to person, place, and time and well-developed, well-nourished, and in no distress. No distress.  HENT:  Head: Normocephalic and atraumatic.  Mouth/Throat: Oropharynx is clear and moist. No oropharyngeal exudate.  Eyes: Conjunctivae are normal. Right eye exhibits no discharge. Left eye exhibits no discharge. No scleral icterus.  Neck: Normal range of motion. Neck supple.  Cardiovascular: Normal rate, regular rhythm, normal heart sounds and intact distal pulses.   Pulmonary/Chest: Effort normal and breath sounds normal. No  respiratory distress. No wheezes. No rales.  Abdominal: Soft. Bowel sounds are normal. Exhibits no distension and no mass. There is no tenderness.  Musculoskeletal: Normal range of motion. Exhibits no edema.  Lymphadenopathy:    No cervical adenopathy.  Neurological: Alert and oriented to person, place, and time. Exhibits normal muscle tone. Gait normal. Coordination normal.  Skin: Skin is warm and dry. No rash noted. Not diaphoretic. No erythema. No pallor.  Psychiatric: Mood, memory and judgment normal.  Vitals reviewed.  LABORATORY DATA: Lab Results  Component Value Date   WBC 2.4 (L) 06/27/2017   HGB 12.2 (L) 06/27/2017   HCT 35.2  (L) 06/27/2017   MCV 104.8 (H) 06/27/2017   PLT 218 06/27/2017      Chemistry      Component Value Date/Time   NA 138 06/27/2017 0748   K 4.0 06/27/2017 0748   CL 99 08/27/2015 0946   CO2 26 06/27/2017 0748   BUN 10.7 06/27/2017 0748   CREATININE 0.8 06/27/2017 0748      Component Value Date/Time   CALCIUM 9.7 06/27/2017 0748   ALKPHOS 83 06/27/2017 0748   AST 21 06/27/2017 0748   ALT 21 06/27/2017 0748   BILITOT 0.49 06/27/2017 0748       RADIOGRAPHIC STUDIES:  No results found.   ASSESSMENT/PLAN:  Cancer of lower lobe of left lung ([place]) This is a very pleasant 66 years old white male with recurrent non-small cell lung cancer initially diagnosed as stage IIIB non-small cell lung cancer favoring adenocarcinoma status post concurrent chemoradiation followed by consolidation chemotherapy. His last treatment was in September 2017. Recent imaging studies showed evidence for disease recurrence. The patient is currently undergoing systemic chemotherapy with carboplatin, Alimta and Keytruda status post 2 cycles and tolerating treatment fairly well. I recommended for him to proceed with cycle #3 as  scheduled.   He will continue to have weekly labs.  The patient will have a restaging CT scan of the chest prior to his next visit. I will see him back for follow-up visit in 3 weeks for evaluation before starting cycle #4 and to review his restaging CT scan results.  The patient was advised to call immediately if he has any concerning symptoms in the interval. The patient voices understanding of current disease status and treatment options and is in agreement with the current care plan. All questions were answered. The patient knows to call the clinic with any problems, questions or concerns. We can certainly see the patient much sooner if necessary.  Orders Placed This Encounter  Procedures  . CT CHEST W CONTRAST    Standing Status:   Future    Standing Expiration Date:    06/27/2018    Order Specific Question:   If indicated for the ordered procedure, I authorize the administration of contrast media per Radiology protocol    Answer:   Yes    Order Specific Question:   Preferred imaging location?    Answer:   [facility]    Order Specific Question:   Radiology Contrast Protocol - do NOT remove file path    Answer:   [email or web address]    Order Specific Question:   Reason for Exam additional comments    Answer:   lung cancer. restaging.    Mikey Bussing, DNP, AGPCNP-BC, AOCNP 06/27/17

## 2017-06-27 NOTE — Patient Instructions (Signed)
[facility] Discharge Instructions for Patients Receiving Chemotherapy  Today you received the following chemotherapy agents: Keytruda, Alimta, and Carboplatin.   To help prevent nausea and vomiting after your treatment, we encourage you to take your nausea medication as prescribed.    If you develop nausea and vomiting that is not controlled by your nausea medication, call the clinic.   BELOW ARE SYMPTOMS THAT SHOULD BE REPORTED IMMEDIATELY:  *FEVER GREATER THAN 100.5 F  *CHILLS WITH OR WITHOUT FEVER  NAUSEA AND VOMITING THAT IS NOT CONTROLLED WITH YOUR NAUSEA MEDICATION  *UNUSUAL SHORTNESS OF BREATH  *UNUSUAL BRUISING OR BLEEDING  TENDERNESS IN MOUTH AND THROAT WITH OR WITHOUT PRESENCE OF ULCERS  *URINARY PROBLEMS  *BOWEL PROBLEMS  UNUSUAL RASH Items with * indicate a potential emergency and should be followed up as soon as possible.  Feel free to call the clinic should you have any questions or concerns. The clinic phone number is [phone].  Please show the [facility] at check-in to the Emergency Department and triage nurse.

## 2017-06-27 NOTE — Telephone Encounter (Signed)
Scheduled appt per 12/12 los - Gave patient AVS and calender per los.

## 2017-07-04 ENCOUNTER — Other Ambulatory Visit (HOSPITAL_BASED_OUTPATIENT_CLINIC_OR_DEPARTMENT_OTHER): Payer: BLUE CROSS/BLUE SHIELD

## 2017-07-04 DIAGNOSIS — C3432 Malignant neoplasm of lower lobe, left bronchus or lung: Secondary | ICD-10-CM | POA: Diagnosis not present

## 2017-07-04 LAB — COMPREHENSIVE METABOLIC PANEL
ALT: 18 U/L (ref 0–55)
AST: 24 U/L (ref 5–34)
Albumin: 3.6 g/dL (ref 3.5–5.0)
Alkaline Phosphatase: 81 U/L (ref 40–150)
Anion Gap: 10 mEq/L (ref 3–11)
BUN: 19.3 mg/dL (ref 7.0–26.0)
CO2: 28 mEq/L (ref 22–29)
Calcium: 9.5 mg/dL (ref 8.4–10.4)
Chloride: 100 mEq/L (ref 98–109)
Creatinine: 0.7 mg/dL (ref 0.7–1.3)
EGFR: >60 mL/min/{1.73_m2} (ref >60)
Glucose: 93 mg/dl (ref 70–140)
Potassium: 4.2 mEq/L (ref 3.5–5.1)
Sodium: 139 mEq/L (ref 136–145)
Total Bilirubin: 1.68 mg/dL — ABNORMAL HIGH (ref 0.20–1.20)
Total Protein: 6.5 g/dL (ref 6.4–8.3)

## 2017-07-04 LAB — CBC WITH DIFFERENTIAL/PLATELET
BASO%: 0.8 % (ref 0.0–2.0)
Basophils Absolute: 0 10*3/uL (ref 0.0–0.1)
EOS%: 0.8 % (ref 0.0–7.0)
Eosinophils Absolute: 0 10*3/uL (ref 0.0–0.5)
HCT: 31.6 % — ABNORMAL LOW (ref 38.4–49.9)
HGB: 11 g/dL — ABNORMAL LOW (ref 13.0–17.1)
LYMPH%: 57.6 % — ABNORMAL HIGH (ref 14.0–49.0)
MCH: 36.4 pg — ABNORMAL HIGH (ref 27.2–33.4)
MCHC: 34.8 g/dL (ref 32.0–36.0)
MCV: 104.6 fL — ABNORMAL HIGH (ref 79.3–98.0)
MONO#: 0 10*3/uL — ABNORMAL LOW (ref 0.1–0.9)
MONO%: 2.3 % (ref 0.0–14.0)
NEUT#: 0.5 10*3/uL — CL (ref 1.5–6.5)
NEUT%: 38.5 % — ABNORMAL LOW (ref 39.0–75.0)
Platelets: 111 10*3/uL — ABNORMAL LOW (ref 140–400)
RBC: 3.02 10*6/uL — ABNORMAL LOW (ref 4.20–5.82)
RDW: 13.6 % (ref 11.0–14.6)
WBC: 1.3 10*3/uL — ABNORMAL LOW (ref 4.0–10.3)
lymph#: 0.8 10*3/uL — ABNORMAL LOW (ref 0.9–3.3)

## 2017-07-11 ENCOUNTER — Other Ambulatory Visit: Payer: Medicare Other

## 2017-07-12 ENCOUNTER — Other Ambulatory Visit (HOSPITAL_BASED_OUTPATIENT_CLINIC_OR_DEPARTMENT_OTHER): Payer: BLUE CROSS/BLUE SHIELD

## 2017-07-12 DIAGNOSIS — C3432 Malignant neoplasm of lower lobe, left bronchus or lung: Secondary | ICD-10-CM | POA: Diagnosis not present

## 2017-07-12 LAB — COMPREHENSIVE METABOLIC PANEL
ALT: 31 U/L (ref 0–55)
AST: 29 U/L (ref 5–34)
Albumin: 3.5 g/dL (ref 3.5–5.0)
Alkaline Phosphatase: 92 U/L (ref 40–150)
Anion Gap: 9 mEq/L (ref 3–11)
BUN: 13.5 mg/dL (ref 7.0–26.0)
CO2: 29 mEq/L (ref 22–29)
Calcium: 9.5 mg/dL (ref 8.4–10.4)
Chloride: 102 mEq/L (ref 98–109)
Creatinine: 0.8 mg/dL (ref 0.7–1.3)
EGFR: >60 mL/min/{1.73_m2} (ref >60)
Glucose: 178 mg/dl — ABNORMAL HIGH (ref 70–140)
Potassium: 4.2 mEq/L (ref 3.5–5.1)
Sodium: 140 mEq/L (ref 136–145)
Total Bilirubin: 0.54 mg/dL (ref 0.20–1.20)
Total Protein: 6.3 g/dL — ABNORMAL LOW (ref 6.4–8.3)

## 2017-07-12 LAB — CBC WITH DIFFERENTIAL/PLATELET
BASO%: 0 % (ref 0.0–2.0)
Basophils Absolute: 0 10*3/uL (ref 0.0–0.1)
EOS%: 1.7 % (ref 0.0–7.0)
Eosinophils Absolute: 0 10*3/uL (ref 0.0–0.5)
HCT: 28.6 % — ABNORMAL LOW (ref 38.4–49.9)
HGB: 10 g/dL — ABNORMAL LOW (ref 13.0–17.1)
LYMPH%: 39.7 % (ref 14.0–49.0)
MCH: 36.1 pg — ABNORMAL HIGH (ref 27.2–33.4)
MCHC: 35 g/dL (ref 32.0–36.0)
MCV: 103.2 fL — ABNORMAL HIGH (ref 79.3–98.0)
MONO#: 0.3 10*3/uL (ref 0.1–0.9)
MONO%: 23.3 % — ABNORMAL HIGH (ref 0.0–14.0)
NEUT#: 0.4 10*3/uL — CL (ref 1.5–6.5)
NEUT%: 35.3 % — ABNORMAL LOW (ref 39.0–75.0)
Platelets: 60 10*3/uL — ABNORMAL LOW (ref 140–400)
RBC: 2.77 10*6/uL — ABNORMAL LOW (ref 4.20–5.82)
RDW: 13.6 % (ref 11.0–14.6)
WBC: 1.2 10*3/uL — ABNORMAL LOW (ref 4.0–10.3)
lymph#: 0.5 10*3/uL — ABNORMAL LOW (ref 0.9–3.3)
nRBC: 0 % (ref 0–0)

## 2017-07-16 ENCOUNTER — Ambulatory Visit (HOSPITAL_COMMUNITY)
Admission: RE | Admit: 2017-07-16 | Discharge: 2017-07-16 | Disposition: A | Discharge status: Home / Self Care | Payer: BLUE CROSS/BLUE SHIELD | Source: Ambulatory Visit | Attending: Oncology | Admitting: Oncology

## 2017-07-16 DIAGNOSIS — C3432 Malignant neoplasm of lower lobe, left bronchus or lung: Secondary | ICD-10-CM | POA: Diagnosis not present

## 2017-07-16 DIAGNOSIS — I700 Atherosclerosis of aorta: Secondary | ICD-10-CM | POA: Insufficient documentation

## 2017-07-16 DIAGNOSIS — I2510 Atherosclerotic heart disease of native coronary artery without angina pectoris: Secondary | ICD-10-CM | POA: Insufficient documentation

## 2017-07-16 MED ORDER — IOPAMIDOL (ISOVUE-300) INJECTION 61%
INTRAVENOUS | Status: AC
  Filled 2017-07-16: qty 75

## 2017-07-16 MED ORDER — IOPAMIDOL (ISOVUE-300) INJECTION 61%
75.0000 mL | Freq: Once | INTRAVENOUS | Status: AC | PRN
  Administered 2017-07-16: 75 mL via INTRAVENOUS

## 2017-07-18 ENCOUNTER — Other Ambulatory Visit (HOSPITAL_BASED_OUTPATIENT_CLINIC_OR_DEPARTMENT_OTHER): Payer: BLUE CROSS/BLUE SHIELD

## 2017-07-18 ENCOUNTER — Inpatient Hospital Stay (HOSPITAL_BASED_OUTPATIENT_CLINIC_OR_DEPARTMENT_OTHER): Payer: BLUE CROSS/BLUE SHIELD | Admitting: Oncology

## 2017-07-18 ENCOUNTER — Telehealth: Payer: Self-pay | Admitting: Medical Oncology

## 2017-07-18 ENCOUNTER — Encounter: Payer: Self-pay | Admitting: Oncology

## 2017-07-18 ENCOUNTER — Inpatient Hospital Stay: Payer: BLUE CROSS/BLUE SHIELD | Attending: Internal Medicine

## 2017-07-18 ENCOUNTER — Other Ambulatory Visit: Payer: Self-pay | Admitting: Medical Oncology

## 2017-07-18 VITALS — BP 122/67 | HR 92 | Temp 98.3°F | Resp 16 | Ht 69.5 in | Wt 214.7 lb

## 2017-07-18 DIAGNOSIS — D649 Anemia, unspecified: Secondary | ICD-10-CM | POA: Insufficient documentation

## 2017-07-18 DIAGNOSIS — C3432 Malignant neoplasm of lower lobe, left bronchus or lung: Secondary | ICD-10-CM | POA: Insufficient documentation

## 2017-07-18 DIAGNOSIS — Z5111 Encounter for antineoplastic chemotherapy: Secondary | ICD-10-CM

## 2017-07-18 DIAGNOSIS — Z5112 Encounter for antineoplastic immunotherapy: Secondary | ICD-10-CM | POA: Insufficient documentation

## 2017-07-18 DIAGNOSIS — R599 Enlarged lymph nodes, unspecified: Secondary | ICD-10-CM

## 2017-07-18 DIAGNOSIS — Z79899 Other long term (current) drug therapy: Secondary | ICD-10-CM | POA: Insufficient documentation

## 2017-07-18 DIAGNOSIS — R5382 Chronic fatigue, unspecified: Secondary | ICD-10-CM

## 2017-07-18 DIAGNOSIS — R609 Edema, unspecified: Secondary | ICD-10-CM | POA: Diagnosis not present

## 2017-07-18 DIAGNOSIS — C3490 Malignant neoplasm of unspecified part of unspecified bronchus or lung: Secondary | ICD-10-CM

## 2017-07-18 DIAGNOSIS — R0609 Other forms of dyspnea: Secondary | ICD-10-CM | POA: Insufficient documentation

## 2017-07-18 DIAGNOSIS — I878 Other specified disorders of veins: Secondary | ICD-10-CM

## 2017-07-18 LAB — CBC WITH DIFFERENTIAL/PLATELET
BASO%: 0.9 % (ref 0.0–2.0)
Basophils Absolute: 0 10*3/uL (ref 0.0–0.1)
EOS%: 0.1 % (ref 0.0–7.0)
Eosinophils Absolute: 0 10*3/uL (ref 0.0–0.5)
HCT: 30.2 % — ABNORMAL LOW (ref 38.4–49.9)
HGB: 10.6 g/dL — ABNORMAL LOW (ref 13.0–17.1)
LYMPH%: 18.8 % (ref 14.0–49.0)
MCH: 37.6 pg — ABNORMAL HIGH (ref 27.2–33.4)
MCHC: 35.1 g/dL (ref 32.0–36.0)
MCV: 107.3 fL — ABNORMAL HIGH (ref 79.3–98.0)
MONO#: 0.8 10*3/uL (ref 0.1–0.9)
MONO%: 24.1 % — ABNORMAL HIGH (ref 0.0–14.0)
NEUT#: 1.8 10*3/uL (ref 1.5–6.5)
NEUT%: 56.1 % (ref 39.0–75.0)
Platelets: 198 10*3/uL (ref 140–400)
RBC: 2.81 10*6/uL — ABNORMAL LOW (ref 4.20–5.82)
RDW: 15.3 % — ABNORMAL HIGH (ref 11.0–14.6)
WBC: 3.3 10*3/uL — ABNORMAL LOW (ref 4.0–10.3)
lymph#: 0.6 10*3/uL — ABNORMAL LOW (ref 0.9–3.3)

## 2017-07-18 LAB — COMPREHENSIVE METABOLIC PANEL
ALT: 24 U/L (ref 0–55)
AST: 22 U/L (ref 5–34)
Albumin: 3.7 g/dL (ref 3.5–5.0)
Alkaline Phosphatase: 93 U/L (ref 40–150)
Anion Gap: 10 mEq/L (ref 3–11)
BUN: 14.9 mg/dL (ref 7.0–26.0)
CO2: 25 mEq/L (ref 22–29)
Calcium: 9.7 mg/dL (ref 8.4–10.4)
Chloride: 103 mEq/L (ref 98–109)
Creatinine: 0.8 mg/dL (ref 0.7–1.3)
EGFR: >60 mL/min/{1.73_m2} (ref >60)
Glucose: 111 mg/dl (ref 70–140)
Potassium: 4 mEq/L (ref 3.5–5.1)
Sodium: 138 mEq/L (ref 136–145)
Total Bilirubin: 0.54 mg/dL (ref 0.20–1.20)
Total Protein: 7 g/dL (ref 6.4–8.3)

## 2017-07-18 LAB — TSH: TSH: 0.293 m(IU)/L — ABNORMAL LOW (ref 0.320–4.118)

## 2017-07-18 MED ORDER — PEMBROLIZUMAB CHEMO INJECTION 100 MG/4ML
200.0000 mg | Freq: Once | INTRAVENOUS | Status: AC
  Administered 2017-07-18: 200 mg via INTRAVENOUS
  Filled 2017-07-18: qty 8

## 2017-07-18 MED ORDER — DIPHENHYDRAMINE HCL 50 MG/ML IJ SOLN
50.0000 mg | Freq: Once | INTRAMUSCULAR | Status: AC
  Administered 2017-07-18: 50 mg via INTRAVENOUS

## 2017-07-18 MED ORDER — DIPHENHYDRAMINE HCL 50 MG/ML IJ SOLN
INTRAMUSCULAR | Status: AC
  Filled 2017-07-18: qty 1

## 2017-07-18 MED ORDER — PEMETREXED DISODIUM CHEMO INJECTION 500 MG
500.0000 mg/m2 | Freq: Once | INTRAVENOUS | Status: AC
  Administered 2017-07-18: 1100 mg via INTRAVENOUS
  Filled 2017-07-18: qty 40

## 2017-07-18 MED ORDER — PALONOSETRON HCL INJECTION 0.25 MG/5ML
INTRAVENOUS | Status: AC
  Filled 2017-07-18: qty 5

## 2017-07-18 MED ORDER — PALONOSETRON HCL INJECTION 0.25 MG/5ML
0.2500 mg | Freq: Once | INTRAVENOUS | Status: AC
  Administered 2017-07-18: 0.25 mg via INTRAVENOUS

## 2017-07-18 MED ORDER — FAMOTIDINE IN NACL 20-0.9 MG/50ML-% IV SOLN
20.0000 mg | Freq: Once | INTRAVENOUS | Status: AC
  Administered 2017-07-18: 20 mg via INTRAVENOUS

## 2017-07-18 MED ORDER — CYANOCOBALAMIN 1000 MCG/ML IJ SOLN
INTRAMUSCULAR | Status: AC
  Filled 2017-07-18: qty 1

## 2017-07-18 MED ORDER — SODIUM CHLORIDE 0.9 % IV SOLN
Freq: Once | INTRAVENOUS | Status: AC
  Administered 2017-07-18: 12:00:00 via INTRAVENOUS

## 2017-07-18 MED ORDER — SODIUM CHLORIDE 0.9 % IV SOLN
Freq: Once | INTRAVENOUS | Status: AC
  Administered 2017-07-18: 12:00:00 via INTRAVENOUS
  Filled 2017-07-18: qty 5

## 2017-07-18 MED ORDER — SODIUM CHLORIDE 0.9 % IV SOLN
630.0000 mg | Freq: Once | INTRAVENOUS | Status: AC
  Administered 2017-07-18: 630 mg via INTRAVENOUS
  Filled 2017-07-18: qty 63

## 2017-07-18 MED ORDER — CYANOCOBALAMIN 1000 MCG/ML IJ SOLN
1000.0000 ug | Freq: Once | INTRAMUSCULAR | Status: AC
  Administered 2017-07-18: 1000 ug via INTRAMUSCULAR

## 2017-07-18 MED ORDER — FAMOTIDINE IN NACL 20-0.9 MG/50ML-% IV SOLN
INTRAVENOUS | Status: AC
  Filled 2017-07-18: qty 50

## 2017-07-18 NOTE — Telephone Encounter (Signed)
Infusion nurse called to report pt requested a port a cath . Order entered.

## 2017-07-18 NOTE — Progress Notes (Signed)
[facility] OFFICE PROGRESS NOTE  Brian Bush, MD [facility] [id number]  DIAGNOSIS: Recurrent non-small cell lung cancer initially diagnosed as Stage IIIB (T1b, N3, M0) non-small cell lung cancer favoring adenocarcinoma presented with left lower lobe pulmonary nodule in addition to left hilar and bilateral mediastinal lymphadenopathy in addition to right supraclavicular lymph nodes diagnosed in April 2017.  PRIOR THERAPY: 1) Concurrent chemoradiation with weekly carboplatin for AUC of 2 and paclitaxel 45 mg/M2 started on 11/15/2015. He is status post 6 cycles. Last dose was getting 12/20/2015 with partial response. 2) Consolidation chemotherapy with carboplatin for AUC of 5 and paclitaxel 175 MG/M2 every 3 weeks with Neulasta support. Status post 3cycles. Last dose was given 04/03/2016.  CURRENT THERAPY: systemic chemotherapy with carboplatin for AUC of 5, Alimta 500 MG/M2 and Ketruda 200 MG IV every 3 weeks. First dose 05/16/2017.  Status post 3 cycles.  INTERVAL HISTORY: [facility] 67 y.o. male returns for routine follow-up visit accompanied by his wife.  The patient is feeling fine today and has no specific complaints except for intermittent right lower extremity edema.  Edema worsens when he is up walking around and improves when he puts his leg up.  He denies pain.  The patient denies fevers and chills.  Denies chest pain, shortness breath, cough, hemoptysis.  Denies nausea, vomiting, constipation, diarrhea.  He denies having any recent weight loss or night sweats.  The patient is here for evaluation prior to starting cycle #4 of his chemotherapy and to review his restaging CT scan result.  MEDICAL HISTORY: Past Medical History:  Diagnosis Date  . Adenocarcinoma, lung ([facility]) 10/08/2015   2.4cm spiculated LLL nodule suspicious for primary bronchogenic carcinoma by CT 09/2015 - adenocarcinoma by endobronchial biopsy  . Allergy   . Arthritis   .  CAD (coronary artery disease)    mild MI; pt unaware - seen on stress test  . COPD (chronic obstructive pulmonary disease) ([place]) 06/26/2011   Mild centrilobular and paraseptal emphysema by CT 09/2015   . ED (erectile dysfunction)   . H/O anaphylactic shock 2013   hives,hypotension,syncope  . H/O seasonal allergies   . Herpes zoster 12/06/2015  . Hypertension   . Polycythemia 01/25/2014  . Prostate cancer [facility]) 2013   s/p radiation therapy March 2013, planned f/u with uro after XRT  . Radiation 09/18/2011 through 11/10/2011   Prostate 7600 cGy 40 sessions, seminal vesicles 5600 cGy 40 sessions    . Radiation 11/10/15-12/22/15   chest 60 Gy  . Shortness of breath dyspnea   . Smoker   . Thoracic aorta atherosclerosis ([place]) 10/08/2015   Mild by CT 09/2015     ALLERGIES:  is allergic to onion.  MEDICATIONS:  Current Outpatient Medications  Medication Sig Dispense Refill  . aspirin 81 MG tablet Take 81 mg by mouth daily. Reported on 12/21/2015    . budesonide-formoterol (SYMBICORT) 160-4.5 MCG/ACT inhaler Inhale 1 puff into the lungs 2 (two) times daily. 3 Inhaler 3  . cetirizine (ZYRTEC) 10 MG chewable tablet Chew 10 mg by mouth daily.    Marland Kitchen dexamethasone (DECADRON) 4 MG tablet 4 mg by mouth twice a day the day before, day of and after the chemotherapy every 3 weeks. 40 tablet 0  . folic acid (FOLVITE) 1 MG tablet Take 1 tablet (1 mg total) by mouth daily. 30 tablet 4  . guaiFENesin (MUCINEX) 600 MG 12 hr tablet Take by mouth 2 (two) times daily.    Marland Kitchen  ibuprofen (ADVIL,MOTRIN) 200 MG tablet Take 600 mg by mouth every 6 (six) hours as needed (Pain). Reported on 12/21/2015    . losartan-hydrochlorothiazide (HYZAAR) 100-25 MG tablet Take 1 tablet daily by mouth. 90 tablet 3  . prochlorperazine (COMPAZINE) 10 MG tablet Take 1 tablet (10 mg total) by mouth every 6 (six) hours as needed for nausea or vomiting. 30 tablet 0   No current facility-administered medications for this visit.      SURGICAL HISTORY:  Past Surgical History:  Procedure Laterality Date  . COLONOSCOPY W/ BIOPSIES AND POLYPECTOMY  10/11/15   mult TAs, 3cm rectal polypoid lesion; rec further treatment, diverticulosis ([facility])  . ENDOBRONCHIAL ULTRASOUND Bilateral 10/18/2015   Procedure: ENDOBRONCHIAL ULTRASOUND;  Surgeon: Javier Glazier, MD;  Location: [facility] ENDOSCOPY;  Service: Cardiopulmonary;  Laterality: Bilateral;  . HERNIA REPAIR Right 2005   with mesh  . INSERTION PROSTATE RADIATION SEED  08/2011   prostate cancer, [facility]    REVIEW OF SYSTEMS:   Review of Systems  Constitutional: Negative for appetite change, chills, fatigue, fever and unexpected weight change.  HENT:   Negative for mouth sores, nosebleeds, sore throat and trouble swallowing.   Eyes: Negative for eye problems and icterus.  Respiratory: Negative for cough, hemoptysis, shortness of breath and wheezing.   Cardiovascular: Negative for chest pain. Intermittent right lower extremity edema. Gastrointestinal: Negative for abdominal pain, constipation, diarrhea, nausea and vomiting.  Genitourinary: Negative for bladder incontinence, difficulty urinating, dysuria, frequency and hematuria.   Musculoskeletal: Negative for back pain, gait problem, neck pain and neck stiffness.  Skin: Negative for itching and rash.  Neurological: Negative for dizziness, extremity weakness, gait problem, headaches, light-headedness and seizures.  Hematological: Negative for adenopathy. Does not bruise/bleed easily.  Psychiatric/Behavioral: Negative for confusion, depression and sleep disturbance. The patient is not nervous/anxious.     PHYSICAL EXAMINATION:  Blood pressure 122/67, pulse 92, temperature 98.3 F (36.8 C), temperature source Oral, resp. rate 16, height 5' 9.5" (1.765 m), weight 214 lb 11.2 oz (97.4 kg), SpO2 98 %.  ECOG PERFORMANCE STATUS: 1 - Symptomatic but completely ambulatory  Physical Exam  Constitutional: Oriented to  person, place, and time and well-developed, well-nourished, and in no distress. No distress.  HENT:  Head: Normocephalic and atraumatic.  Mouth/Throat: Oropharynx is clear and moist. No oropharyngeal exudate.  Eyes: Conjunctivae are normal. Right eye exhibits no discharge. Left eye exhibits no discharge. No scleral icterus.  Neck: Normal range of motion. Neck supple.  Cardiovascular: Normal rate, regular rhythm, normal heart sounds and intact distal pulses.  1+ edema to the right lower extremity.  No palpable cords. Pulmonary/Chest: Effort normal and breath sounds normal. No respiratory distress. No wheezes. No rales.  Abdominal: Soft. Bowel sounds are normal. Exhibits no distension and no mass. There is no tenderness.  Musculoskeletal: Normal range of motion. Lymphadenopathy:    No cervical adenopathy.  Neurological: Alert and oriented to person, place, and time. Exhibits normal muscle tone. Gait normal. Coordination normal.  Skin: Skin is warm and dry. No rash noted. Not diaphoretic. No erythema. No pallor.  Psychiatric: Mood, memory and judgment normal.  Vitals reviewed.  LABORATORY DATA: Lab Results  Component Value Date   WBC 3.3 (L) 07/18/2017   HGB 10.6 (L) 07/18/2017   HCT 30.2 (L) 07/18/2017   MCV 107.3 (H) 07/18/2017   PLT 198 07/18/2017      Chemistry      Component Value Date/Time   NA 138 07/18/2017 1000   K 4.0 07/18/2017 1000  CL 99 08/27/2015 0946   CO2 25 07/18/2017 1000   BUN 14.9 07/18/2017 1000   CREATININE 0.8 07/18/2017 1000      Component Value Date/Time   CALCIUM 9.7 07/18/2017 1000   ALKPHOS 93 07/18/2017 1000   AST 22 07/18/2017 1000   ALT 24 07/18/2017 1000   BILITOT 0.54 07/18/2017 1000       RADIOGRAPHIC STUDIES:  Ct Chest W Contrast  Result Date: 07/16/2017 CLINICAL DATA:  Followup lung cancer EXAM: CT CHEST WITH CONTRAST TECHNIQUE: Multidetector CT imaging of the chest was performed during intravenous contrast administration.  CONTRAST:  92mL ISOVUE-300 IOPAMIDOL (ISOVUE-300) INJECTION 61% COMPARISON:  PET-CT from 03/08/2017 and CT chest from 02/06/2017. FINDINGS: Cardiovascular: Normal heart size. Calcifications along the lateral wall of the left ventricle is identified compatible with prior infarct, image 107 of series 2. Aortic atherosclerosis. Calcification within the left main coronary artery, LAD and RCA. Mediastinum/Nodes: The trachea appears patent and is midline. Normal appearance of the esophagus. 9 mm right paratracheal lymph node identified, image 48 of series 2. Unchanged from previous exam. Stable left paratracheal lymph node measuring 9 mm, image 56 of series 2. No new or progressive mediastinal adenopathy. No axillary or supraclavicular adenopathy. Lungs/Pleura: Left lower lobe perihilar nodule measures 2.2 cm, image 92 of series 3. Previously 2.1 cm. Sub solid nodule within the anterior left upper lobe measures 7 mm, image 33 of series 7. Unchanged from previous exam. Upper Abdomen: No acute abnormality. There is hypertrophy of the caudate lobe of liver. No focal liver abnormality identified. Prominent gastrohepatic ligament lymph node is new from previous exam, short axis 9 mm, image 144 of series 2. Musculoskeletal: Degenerative disc disease identified throughout the thoracic spine. No aggressive lytic or sclerotic bone lesions. IMPRESSION: 1. Stable appearance of left lower lobe perihilar nodule. Prominent subcentimeter mediastinal lymph nodes are also stable in the interval. 2. New gastrohepatic ligament lymph node measures 9 mm. Attention on follow-up imaging advised. 3. Aortic Atherosclerosis (ICD10-I70.0). Multi vessel coronary artery calcifications including left main disease. Electronically Signed   By: Kerby Moors M.D.   On: 07/16/2017 09:26     ASSESSMENT/PLAN:  Cancer of lower lobe of left lung [facility]) This is a very pleasant 67 year old white male with recurrent non-small cell lung cancer initially  diagnosed as stage IIIB non-small cell lung cancer favoring adenocarcinoma status post concurrent chemoradiation followed by consolidation chemotherapy. His last treatment was in September 2017. Recent imaging studies showed evidence for disease recurrence. The patient is currently undergoing systemic chemotherapy with carboplatin, Alimta and Keytruda status post 3 cycles and tolerating treatment fairly well.  The patient was seen with Dr. Julien Nordmann.  Restaging CT scan was reviewed with the patient and his wife.  We discussed that the CT shows overall stable disease with a new subcentimeter lymph node noted in the gastrohepatic ligament.  We discussed that we will watch this closely on follow-up scans.  Recommend for the patient to proceed with cycle 4 of treatment today which will consist of carboplatin for an AUC of 5, Alimta 500 mg/m, and Keytruda 200 mg.  Beginning with cycle 5, the patient will proceed with Alimta and Keytruda only.  He will have weekly labs for the next 2 weeks.he will not require weekly labs once he changes over to Alimta and Keytruda. Will delay his next cycle of chemotherapy by 1 week per the patient request since he will be out of town.     The patient will have  a follow-up visit in approximately 4 weeks for evaluation prior to cycle 5 of chemotherapy.  For his lower extremity edema, he was encouraged to elevate his legs as much as possible during the day.  He was also encouraged to elevate his leg on a pillow at night.  The patient was advised to call immediately if he has any concerning symptoms in the interval. The patient voices understanding of current disease status and treatment options and is in agreement with the current care plan. All questions were answered. The patient knows to call the clinic with any problems, questions or concerns. We can certainly see the patient much sooner if necessary.  No orders of the defined types were placed in this  encounter.   Mikey Bussing, DNP, AGPCNP-BC, AOCNP 07/18/17   ADDENDUM: Hematology/Oncology Attending: I had a face-to-face encounter with the patient today.  I recommended his care plan.  This is a very pleasant 67 years old white male with recurrent non-small cell lung cancer, adenocarcinoma.  The patient is currently undergoing systemic chemotherapy with carboplatin, Alimta and Keytruda status post 3 cycles.  He has been tolerating this treatment fairly well with no significant adverse effects. He had repeat CT scan of the chest, abdomen and pelvis performed recently.  His a scan showed stable disease in the chest but there was new subcentimeter lymph node in the gastrohepatic ligament that need close observation of her upcoming scans. I recommended for the patient to proceed with cycle #4 of his treatment today with carboplatin, Alimta and Keytruda. Starting from cycle #5 the patient will be treated with maintenance Alimta and Keytruda. He will come back for follow-up visit in 3 weeks with the start of the next cycle of his treatment. The patient was advised to call immediately if he has any concerning symptoms in the interval.  Disclaimer: This note was dictated with voice recognition software. Similar sounding words can inadvertently be transcribed and may be missed upon review. Eilleen Kempf, MD 07/18/17

## 2017-07-18 NOTE — Patient Instructions (Signed)
[facility] Discharge Instructions for Patients Receiving Chemotherapy  Today you received the following chemotherapy agents Keytruda, Alimta, and Carboplatin  To help prevent nausea and vomiting after your treatment, we encourage you to take your nausea medication as directed.  If you develop nausea and vomiting that is not controlled by your nausea medication, call the clinic.   BELOW ARE SYMPTOMS THAT SHOULD BE REPORTED IMMEDIATELY:  *FEVER GREATER THAN 100.5 F  *CHILLS WITH OR WITHOUT FEVER  NAUSEA AND VOMITING THAT IS NOT CONTROLLED WITH YOUR NAUSEA MEDICATION  *UNUSUAL SHORTNESS OF BREATH  *UNUSUAL BRUISING OR BLEEDING  TENDERNESS IN MOUTH AND THROAT WITH OR WITHOUT PRESENCE OF ULCERS  *URINARY PROBLEMS  *BOWEL PROBLEMS  UNUSUAL RASH Items with * indicate a potential emergency and should be followed up as soon as possible.  Feel free to call the clinic should you have any questions or concerns. The clinic phone number is [phone].  Please show the CHEMO ALERT CARD at check-in to the Emergency Department and triage nurse.   

## 2017-07-18 NOTE — Assessment & Plan Note (Signed)
This is a very pleasant 67 year old white male with recurrent non-small cell lung cancer initially diagnosed as stage IIIB non-small cell lung cancer favoring adenocarcinoma status post concurrent chemoradiation followed by consolidation chemotherapy. His last treatment was in September 2017. Recent imaging studies showed evidence for disease recurrence. The patient is currently undergoing systemic chemotherapy with carboplatin, Alimta and Keytruda status post 3 cycles and tolerating treatment fairly well.  The patient was seen with Dr. Julien Nordmann.  Restaging CT scan was reviewed with the patient and his wife.  We discussed that the CT shows overall stable disease with a new subcentimeter lymph node noted in the gastrohepatic ligament.  We discussed that we will watch this closely on follow-up scans.  Recommend for the patient to proceed with cycle 4 of treatment today which will consist of carboplatin for an AUC of 5, Alimta 500 mg/m, and Keytruda 200 mg.  Beginning with cycle 5, the patient will proceed with Alimta and Keytruda only.  He will have weekly labs for the next 2 weeks.he will not require weekly labs once he changes over to Alimta and Keytruda. Will delay his next cycle of chemotherapy by 1 week per the patient request since he will be out of town.     The patient will have a follow-up visit in approximately 4 weeks for evaluation prior to cycle 5 of chemotherapy.  For his lower extremity edema, he was encouraged to elevate his legs as much as possible during the day.  He was also encouraged to elevate his leg on a pillow at night.  The patient was advised to call immediately if he has any concerning symptoms in the interval. The patient voices understanding of current disease status and treatment options and is in agreement with the current care plan. All questions were answered. The patient knows to call the clinic with any problems, questions or concerns. We can certainly see the patient  much sooner if necessary.

## 2017-07-24 ENCOUNTER — Other Ambulatory Visit: Payer: Self-pay | Admitting: Radiology

## 2017-07-24 ENCOUNTER — Telehealth: Payer: Self-pay | Admitting: Oncology

## 2017-07-24 NOTE — Telephone Encounter (Signed)
Spoke to patient regarding upcoming January through March appointments.

## 2017-07-25 ENCOUNTER — Inpatient Hospital Stay: Payer: BLUE CROSS/BLUE SHIELD

## 2017-07-25 DIAGNOSIS — R0609 Other forms of dyspnea: Secondary | ICD-10-CM | POA: Diagnosis not present

## 2017-07-25 DIAGNOSIS — D649 Anemia, unspecified: Secondary | ICD-10-CM | POA: Diagnosis not present

## 2017-07-25 DIAGNOSIS — C3432 Malignant neoplasm of lower lobe, left bronchus or lung: Secondary | ICD-10-CM | POA: Diagnosis not present

## 2017-07-25 DIAGNOSIS — Z79899 Other long term (current) drug therapy: Secondary | ICD-10-CM | POA: Diagnosis not present

## 2017-07-25 DIAGNOSIS — Z5112 Encounter for antineoplastic immunotherapy: Secondary | ICD-10-CM | POA: Diagnosis not present

## 2017-07-25 LAB — CBC WITH DIFFERENTIAL/PLATELET
Abs Granulocyte: 0.6 10*3/uL — ABNORMAL LOW (ref 1.5–6.5)
Basophils Absolute: 0 10*3/uL (ref 0.0–0.1)
Basophils Relative: 1 %
Eosinophils Absolute: 0 10*3/uL (ref 0.0–0.5)
Eosinophils Relative: 1 %
HCT: 28.1 % — ABNORMAL LOW (ref 38.4–49.9)
Hemoglobin: 9.8 g/dL — ABNORMAL LOW (ref 13.0–17.1)
Lymphocytes Relative: 46 %
Lymphs Abs: 0.6 10*3/uL — ABNORMAL LOW (ref 0.9–3.3)
MCH: 36.4 pg — ABNORMAL HIGH (ref 27.2–33.4)
MCHC: 34.9 g/dL (ref 32.0–36.0)
MCV: 104.5 fL — ABNORMAL HIGH (ref 79.3–98.0)
Monocytes Absolute: 0.1 10*3/uL (ref 0.1–0.9)
Monocytes Relative: 9 %
Neutro Abs: 0.6 10*3/uL — ABNORMAL LOW (ref 1.5–6.5)
Neutrophils Relative %: 43 %
Platelets: 110 10*3/uL — ABNORMAL LOW (ref 140–400)
RBC: 2.69 MIL/uL — ABNORMAL LOW (ref 4.20–5.82)
RDW: 14.8 % (ref 11.0–15.6)
WBC: 1.3 10*3/uL — ABNORMAL LOW (ref 4.0–10.3)

## 2017-07-25 LAB — COMPREHENSIVE METABOLIC PANEL
ALT: 29 U/L (ref 0–55)
AST: 25 U/L (ref 5–34)
Albumin: 3.8 g/dL (ref 3.5–5.0)
Alkaline Phosphatase: 79 U/L (ref 40–150)
Anion gap: 8 (ref 3–11)
BUN: 25 mg/dL (ref 7–26)
CO2: 29 mmol/L (ref 22–29)
Calcium: 9.8 mg/dL (ref 8.4–10.4)
Chloride: 103 mmol/L (ref 98–109)
Creatinine, Ser: 0.83 mg/dL (ref 0.70–1.30)
GFR calc Af Amer: >60 mL/min (ref >60)
GFR calc non Af Amer: >60 mL/min (ref >60)
Glucose, Bld: 102 mg/dL (ref 70–140)
Potassium: 4.9 mmol/L (ref 3.5–5.1)
Sodium: 140 mmol/L (ref 136–145)
Total Bilirubin: 1.6 mg/dL — ABNORMAL HIGH (ref 0.2–1.2)
Total Protein: 6.8 g/dL (ref 6.4–8.3)

## 2017-07-26 ENCOUNTER — Ambulatory Visit (HOSPITAL_COMMUNITY)
Admission: RE | Admit: 2017-07-26 | Discharge: 2017-07-26 | Disposition: A | Discharge status: Home / Self Care | Payer: BLUE CROSS/BLUE SHIELD | Source: Ambulatory Visit | Attending: Internal Medicine | Admitting: Internal Medicine

## 2017-07-26 ENCOUNTER — Encounter (HOSPITAL_COMMUNITY): Payer: Self-pay

## 2017-07-26 ENCOUNTER — Other Ambulatory Visit: Payer: Self-pay | Admitting: Internal Medicine

## 2017-07-26 ENCOUNTER — Telehealth: Payer: Self-pay | Admitting: Medical Oncology

## 2017-07-26 DIAGNOSIS — Z5111 Encounter for antineoplastic chemotherapy: Secondary | ICD-10-CM | POA: Diagnosis not present

## 2017-07-26 DIAGNOSIS — Z9221 Personal history of antineoplastic chemotherapy: Secondary | ICD-10-CM | POA: Insufficient documentation

## 2017-07-26 DIAGNOSIS — Z923 Personal history of irradiation: Secondary | ICD-10-CM | POA: Insufficient documentation

## 2017-07-26 DIAGNOSIS — C3490 Malignant neoplasm of unspecified part of unspecified bronchus or lung: Secondary | ICD-10-CM | POA: Insufficient documentation

## 2017-07-26 DIAGNOSIS — I878 Other specified disorders of veins: Secondary | ICD-10-CM

## 2017-07-26 HISTORY — PX: IR US GUIDE VASC ACCESS RIGHT: IMG2390

## 2017-07-26 HISTORY — PX: IR FLUORO GUIDE PORT INSERTION RIGHT: IMG5741

## 2017-07-26 LAB — PROTIME-INR
INR: 0.9
Prothrombin Time: 12.1 seconds (ref 11.4–15.2)

## 2017-07-26 LAB — CBC WITH DIFFERENTIAL/PLATELET
Basophils Absolute: 0 10*3/uL (ref 0.0–0.1)
Basophils Relative: 2 %
Eosinophils Absolute: 0 10*3/uL (ref 0.0–0.7)
Eosinophils Relative: 2 %
HCT: 26 % — ABNORMAL LOW (ref 39.0–52.0)
Hemoglobin: 9.3 g/dL — ABNORMAL LOW (ref 13.0–17.0)
Lymphocytes Relative: 43 %
Lymphs Abs: 0.5 10*3/uL — ABNORMAL LOW (ref 0.7–4.0)
MCH: 37.1 pg — ABNORMAL HIGH (ref 26.0–34.0)
MCHC: 35.8 g/dL (ref 30.0–36.0)
MCV: 103.6 fL — ABNORMAL HIGH (ref 78.0–100.0)
Monocytes Absolute: 0.1 10*3/uL (ref 0.1–1.0)
Monocytes Relative: 11 %
Neutro Abs: 0.6 10*3/uL — ABNORMAL LOW (ref 1.7–7.7)
Neutrophils Relative %: 42 %
Platelets: 98 10*3/uL — ABNORMAL LOW (ref 150–400)
RBC: 2.51 MIL/uL — ABNORMAL LOW (ref 4.22–5.81)
RDW: 14.8 % (ref 11.5–15.5)
WBC: 1.2 10*3/uL — CL (ref 4.0–10.5)

## 2017-07-26 MED ORDER — SODIUM CHLORIDE 0.9 % IV SOLN
INTRAVENOUS | Status: DC
  Administered 2017-07-26: 08:00:00 via INTRAVENOUS

## 2017-07-26 MED ORDER — MIDAZOLAM HCL 2 MG/2ML IJ SOLN
INTRAMUSCULAR | Status: AC | PRN
  Administered 2017-07-26 (×3): 1 mg via INTRAVENOUS

## 2017-07-26 MED ORDER — LIDOCAINE-EPINEPHRINE (PF) 2 %-1:200000 IJ SOLN
INTRAMUSCULAR | Status: AC | PRN
  Administered 2017-07-26: 10 mL

## 2017-07-26 MED ORDER — LIDOCAINE-EPINEPHRINE (PF) 2 %-1:200000 IJ SOLN
INTRAMUSCULAR | Status: AC
  Filled 2017-07-26: qty 20

## 2017-07-26 MED ORDER — FENTANYL CITRATE (PF) 100 MCG/2ML IJ SOLN
INTRAMUSCULAR | Status: AC | PRN
  Administered 2017-07-26: 50 ug via INTRAVENOUS
  Administered 2017-07-26 (×2): 25 ug via INTRAVENOUS

## 2017-07-26 MED ORDER — FENTANYL CITRATE (PF) 100 MCG/2ML IJ SOLN
INTRAMUSCULAR | Status: AC
  Filled 2017-07-26: qty 6

## 2017-07-26 MED ORDER — HEPARIN SOD (PORK) LOCK FLUSH 100 UNIT/ML IV SOLN
INTRAVENOUS | Status: AC
  Filled 2017-07-26: qty 5

## 2017-07-26 MED ORDER — CEFAZOLIN SODIUM-DEXTROSE 2-4 GM/100ML-% IV SOLN
2.0000 g | INTRAVENOUS | Status: AC
  Administered 2017-07-26: 2 g via INTRAVENOUS
  Filled 2017-07-26: qty 100

## 2017-07-26 MED ORDER — MIDAZOLAM HCL 2 MG/2ML IJ SOLN
INTRAMUSCULAR | Status: AC
  Filled 2017-07-26: qty 6

## 2017-07-26 NOTE — H&P (Signed)
Referring Physician(s): Mohamed,Mohamed  Supervising Physician: Sandi Mariscal  Patient Status:  [facility] OP  Chief Complaint:  "I'm getting a port"  Subjective: Patient familiar to IR service from prior right supraclavicular lymph node biopsy in April 2017.  He is a smoker with history of recurrent non-small cell left lung cancer with prior chemoradiation.  He has poor venous access and presents today for Port-A-Cath placement for additional treatment. He currently denies fever, headache, chest pain, worsening dyspnea, abdominal/back pain, nausea, vomiting or bleeding.  He does have intermittent coughing. Past Medical History:  Diagnosis Date  . Adenocarcinoma, lung ([place]) 10/08/2015   2.4cm spiculated LLL nodule suspicious for primary bronchogenic carcinoma by CT 09/2015 - adenocarcinoma by endobronchial biopsy  . Allergy   . Arthritis   . CAD (coronary artery disease)    mild MI; pt unaware - seen on stress test  . COPD (chronic obstructive pulmonary disease) ([place]) 06/26/2011   Mild centrilobular and paraseptal emphysema by CT 09/2015   . ED (erectile dysfunction)   . H/O anaphylactic shock 2013   hives,hypotension,syncope  . H/O seasonal allergies   . Herpes zoster 12/06/2015  . Hypertension   . Polycythemia 01/25/2014  . Prostate cancer [facility]) 2013   s/p radiation therapy March 2013, planned f/u with uro after XRT  . Radiation 09/18/2011 through 11/10/2011   Prostate 7600 cGy 40 sessions, seminal vesicles 5600 cGy 40 sessions    . Radiation 11/10/15-12/22/15   chest 60 Gy  . Shortness of breath dyspnea   . Smoker   . Thoracic aorta atherosclerosis ([place]) 10/08/2015   Mild by CT 09/2015    Past Surgical History:  Procedure Laterality Date  . COLONOSCOPY W/ BIOPSIES AND POLYPECTOMY  10/11/15   mult TAs, 3cm rectal polypoid lesion; rec further treatment, diverticulosis ([place])  . ENDOBRONCHIAL ULTRASOUND Bilateral 10/18/2015   Procedure: ENDOBRONCHIAL  ULTRASOUND;  Surgeon: Javier Glazier, MD;  Location: [facility];  Service: Cardiopulmonary;  Laterality: Bilateral;  . HERNIA REPAIR Right 2005   with mesh  . INSERTION PROSTATE RADIATION SEED  08/2011   prostate cancer, [place]    Allergies: Onion  Medications: Prior to Admission medications   Medication Sig Start Date End Date Taking? Authorizing Provider  aspirin 81 MG tablet Take 81 mg by mouth daily. Reported on 12/21/2015   Yes [provider]  budesonide-formoterol (SYMBICORT) 160-4.5 MCG/ACT inhaler Inhale 1 puff into the lungs 2 (two) times daily. 02/28/17  Yes [name], MD  cetirizine (ZYRTEC) 10 MG chewable tablet Chew 10 mg by mouth daily. 02/19/17  Yes [provider]  dexamethasone (DECADRON) 4 MG tablet 4 mg by mouth twice a day the day before, day of and after the chemotherapy every 3 weeks. 05/08/17  Yes [name], MD  folic acid (FOLVITE) 1 MG tablet Take 1 tablet (1 mg total) by mouth daily. 05/08/17  Yes [name], MD  guaiFENesin (MUCINEX) 600 MG 12 hr tablet Take by mouth 2 (two) times daily.   Yes [provider]  ibuprofen (ADVIL,MOTRIN) 200 MG tablet Take 600 mg by mouth every 6 (six) hours as needed (Pain). Reported on 12/21/2015   Yes [provider]  losartan-hydrochlorothiazide Konrad Penta) 100-25 MG tablet Take 1 tablet daily by mouth. 05/29/17  Yes [name], MD  prochlorperazine (COMPAZINE) 10 MG tablet Take 1 tablet (10 mg total) by mouth every 6 (six) hours as needed for nausea or vomiting. 05/08/17  Yes [name], MD     Vital Signs: BP Marland Kitchen)  144/71 (BP Location: Left Arm)   Pulse 72   Temp 98.5 F (36.9 C) (Oral)   Resp 16   Ht 5\' 11"  (1.803 m)   Wt 215 lb (97.5 kg)   SpO2 100%   BMI 29.99 kg/m   Physical Exam awake, alert.  Chest with distant but clear breath sounds slightly less diminished in left base.  Heart with regular rate and rhythm.  Abdomen soft, obese, positive bowel  sounds, nontender.  Trace right lower extremity edema, no left lower extremity edema.  Imaging: No results found.  Labs:  CBC: Recent Labs    07/12/17 1059 07/18/17 1000 07/25/17 1055 07/26/17 0746  WBC 1.2* 3.3* 1.3* 1.2*  HGB 10.0* 10.6* 9.8* 9.3*  HCT 28.6* 30.2* 28.1* 26.0*  PLT 60* 198 110* 98*    COAGS: Recent Labs    07/26/17 0746  INR 0.90    BMP: Recent Labs    07/04/17 1056 07/12/17 1059 07/18/17 1000 07/25/17 1055  NA 139 140 138 140  K 4.2 4.2 4.0 4.9  CL  --   --   --  103  CO2 28 29 25 29   GLUCOSE 93 178* 111 102  BUN 19.3 13.5 14.9 25  CALCIUM 9.5 9.5 9.7 9.8  CREATININE 0.7 0.8 0.8 0.83  GFRNONAA  --   --   --  >60  GFRAA  --   --   --  >60    LIVER FUNCTION TESTS: Recent Labs    07/04/17 1056 07/12/17 1059 07/18/17 1000 07/25/17 1055  BILITOT 1.68* 0.54 0.54 1.6*  AST 24 29 22 25   ALT 18 31 24 29   ALKPHOS 81 92 93 79  PROT 6.5 6.3* 7.0 6.8  ALBUMIN 3.6 3.5 3.7 3.8    Assessment and Plan: 67 yo smoker with history of recurrent non-small cell left lung cancer with prior chemoradiation.  He has poor venous access and presents today for Port-A-Cath placement for additional treatment.Risks and benefits discussed with the patient/spouse including, but not limited to bleeding, infection, pneumothorax, or fibrin sheath development and need for additional procedures. All of the patient's questions were answered, patient is agreeable to proceed. Consent signed and in chart.  WBC today 1.2 with absolute neutrophils of 0.6.  Above discussed with Dr. Earlie Server and okay to proceed with Port-A-Cath placement.     Electronically Signed: D. Rowe Robert, PA-C 07/26/2017, 8:57 AM   I spent a total of 25 minutes at the the patient's bedside AND on the patient's hospital floor or unit, greater than 50% of which was counseling/coordinating care for Port-A-Cath placement

## 2017-07-26 NOTE — Telephone Encounter (Signed)
Per Julien Nordmann it is okay to proceed with port a cath procedure with WBC of 1.2 and ANC of 0.6.

## 2017-07-26 NOTE — Procedures (Signed)
Pre Procedure Dx: Lung cancer Post Procedural Dx: Same  Successful placement of right IJ approach port-a-cath with tip at the superior caval atrial junction. The catheter is ready for immediate use.  Estimated Blood Loss: Minimal  Complications: None immediate.  Jay Meranda Dechaine, MD Pager #: [phone]   

## 2017-07-26 NOTE — Progress Notes (Signed)
CRITICAL VALUE ALERT  Critical Value:  WBC 1.2  Date & Time Notied:  07/26/17 @ 0815  Provider Notified: Rowe Robert  Orders Received/Actions taken: Pt at baseline. No orders at this time.

## 2017-07-26 NOTE — Discharge Instructions (Signed)
Moderate Conscious Sedation, Adult, Care After °These instructions provide you with information about caring for yourself after your procedure. Your health care provider may also give you more specific instructions. Your treatment has been planned according to current medical practices, but problems sometimes occur. Call your health care provider if you have any problems or questions after your procedure. °What can I expect after the procedure? °After your procedure, it is common: °· To feel sleepy for several hours. °· To feel clumsy and have poor balance for several hours. °· To have poor judgment for several hours. °· To vomit if you eat too soon. ° °Follow these instructions at home: °For at least 24 hours after the procedure: ° °· Do not: °? Participate in activities where you could fall or become injured. °? Drive. °? Use heavy machinery. °? Drink alcohol. °? Take sleeping pills or medicines that cause drowsiness. °? Make important decisions or sign legal documents. °? Take care of children on your own. °· Rest. °Eating and drinking °· Follow the diet recommended by your health care provider. °· If you vomit: °? Drink water, juice, or soup when you can drink without vomiting. °? Make sure you have little or no nausea before eating solid foods. °General instructions °· Have a responsible adult stay with you until you are awake and alert. °· Take over-the-counter and prescription medicines only as told by your health care provider. °· If you smoke, do not smoke without supervision. °· Keep all follow-up visits as told by your health care provider. This is important. °Contact a health care provider if: °· You keep feeling nauseous or you keep vomiting. °· You feel light-headed. °· You develop a rash. °· You have a fever. °Get help right away if: °· You have trouble breathing. °This information is not intended to replace advice given to you by your health care provider. Make sure you discuss any questions you have  with your health care provider. °Document Released: 04/23/2013 Document Revised: 12/06/2015 Document Reviewed: 10/23/2015 °[employer]vier Interactive Patient Education © 2018 [employer] °Implanted Port Home Guide °An implanted port is a type of central line that is placed under the skin. Central lines are used to provide IV access when treatment or nutrition needs to be given through a person’s veins. Implanted ports are used for long-term IV access. An implanted port may be placed because: °· You need IV medicine that would be irritating to the small veins in your hands or arms. °· You need long-term IV medicines, such as antibiotics. °· You need IV nutrition for a long period. °· You need frequent blood draws for lab tests. °· You need dialysis. ° °Implanted ports are usually placed in the chest area, but they can also be placed in the upper arm, the abdomen, or the leg. An implanted port has two main parts: °· Reservoir. The reservoir is round and will appear as a small, raised area under your skin. The reservoir is the part where a needle is inserted to give medicines or draw blood. °· Catheter. The catheter is a thin, flexible tube that extends from the reservoir. The catheter is placed into a large vein. Medicine that is inserted into the reservoir goes into the catheter and then into the vein. ° °How will I care for my incision site? °Do not get the incision site wet. Bathe or shower as directed by your health care provider. °How is my port accessed? °Special steps must be taken to access the port: °·   Before the port is accessed, a numbing cream can be placed on the skin. This helps numb the skin over the port site. °· Your health care provider uses a sterile technique to access the port. °? Your health care provider must put on a mask and sterile gloves. °? The skin over your port is cleaned carefully with an antiseptic and allowed to dry. °? The port is gently pinched between sterile gloves, and a needle is  inserted into the port. °· Only "non-coring" port needles should be used to access the port. Once the port is accessed, a blood return should be checked. This helps ensure that the port is in the vein and is not clogged. °· If your port needs to remain accessed for a constant infusion, a clear (transparent) bandage will be placed over the needle site. The bandage and needle will need to be changed every week, or as directed by your health care provider. °· Keep the bandage covering the needle clean and dry. Do not get it wet. Follow your health care provider’s instructions on how to take a shower or bath while the port is accessed. °· If your port does not need to stay accessed, no bandage is needed over the port. ° °What is flushing? °Flushing helps keep the port from getting clogged. Follow your health care provider’s instructions on how and when to flush the port. Ports are usually flushed with saline solution or a medicine called heparin. The need for flushing will depend on how the port is used. °· If the port is used for intermittent medicines or blood draws, the port will need to be flushed: °? After medicines have been given. °? After blood has been drawn. °? As part of routine maintenance. °· If a constant infusion is running, the port may not need to be flushed. ° °How long will my port stay implanted? °The port can stay in for as long as your health care provider thinks it is needed. When it is time for the port to come out, surgery will be done to remove it. The procedure is similar to the one performed when the port was put in. °When should I seek immediate medical care? °When you have an implanted port, you should seek immediate medical care if: °· You notice a bad smell coming from the incision site. °· You have swelling, redness, or drainage at the incision site. °· You have more swelling or pain at the port site or the surrounding area. °· You have a fever that is not controlled with  medicine. ° °This information is not intended to replace advice given to you by your health care provider. Make sure you discuss any questions you have with your health care provider. °Document Released: 07/03/2005 Document Revised: 12/09/2015 Document Reviewed: 03/10/2013 °Elsevier Interactive Patient Education © 2017 [employer] ° °

## 2017-07-29 ENCOUNTER — Encounter: Payer: Self-pay | Admitting: Internal Medicine

## 2017-07-30 ENCOUNTER — Other Ambulatory Visit: Payer: Self-pay | Admitting: Medical Oncology

## 2017-07-30 DIAGNOSIS — Z95828 Presence of other vascular implants and grafts: Secondary | ICD-10-CM

## 2017-07-30 MED ORDER — LIDOCAINE-PRILOCAINE 2.5-2.5 % EX CREA: 1.0000 "application " | TOPICAL_CREAM | CUTANEOUS | 0 refills | Status: AC | PRN

## 2017-08-01 ENCOUNTER — Inpatient Hospital Stay: Payer: BLUE CROSS/BLUE SHIELD

## 2017-08-01 ENCOUNTER — Other Ambulatory Visit: Payer: Self-pay | Admitting: Medical Oncology

## 2017-08-01 DIAGNOSIS — D649 Anemia, unspecified: Secondary | ICD-10-CM | POA: Diagnosis not present

## 2017-08-01 DIAGNOSIS — R0609 Other forms of dyspnea: Secondary | ICD-10-CM | POA: Diagnosis not present

## 2017-08-01 DIAGNOSIS — Z5112 Encounter for antineoplastic immunotherapy: Secondary | ICD-10-CM | POA: Diagnosis not present

## 2017-08-01 DIAGNOSIS — C3432 Malignant neoplasm of lower lobe, left bronchus or lung: Secondary | ICD-10-CM

## 2017-08-01 DIAGNOSIS — Z79899 Other long term (current) drug therapy: Secondary | ICD-10-CM | POA: Diagnosis not present

## 2017-08-01 LAB — CBC WITH DIFFERENTIAL/PLATELET
Basophils Absolute: 0 10*3/uL (ref 0.0–0.1)
Basophils Relative: 0 %
Eosinophils Absolute: 0 10*3/uL (ref 0.0–0.5)
Eosinophils Relative: 1 %
HCT: 22.7 % — ABNORMAL LOW (ref 38.4–49.9)
Hemoglobin: 7.9 g/dL — ABNORMAL LOW (ref 13.0–17.1)
Lymphocytes Relative: 34 %
Lymphs Abs: 0.5 10*3/uL — ABNORMAL LOW (ref 0.9–3.3)
MCH: 36.2 pg — ABNORMAL HIGH (ref 27.2–33.4)
MCHC: 34.8 g/dL (ref 32.0–36.0)
MCV: 104.1 fL — ABNORMAL HIGH (ref 79.3–98.0)
Monocytes Absolute: 0.2 10*3/uL (ref 0.1–0.9)
Monocytes Relative: 15 %
Neutro Abs: 0.7 10*3/uL — ABNORMAL LOW (ref 1.5–6.5)
Neutrophils Relative %: 50 %
Platelets: 35 10*3/uL — ABNORMAL LOW (ref 140–400)
RBC: 2.18 MIL/uL — ABNORMAL LOW (ref 4.20–5.82)
RDW: 15.1 % (ref 11.0–15.6)
WBC: 1.5 10*3/uL — ABNORMAL LOW (ref 4.0–10.3)

## 2017-08-01 LAB — COMPREHENSIVE METABOLIC PANEL
ALT: 33 U/L (ref 0–55)
AST: 30 U/L (ref 5–34)
Albumin: 3.5 g/dL (ref 3.5–5.0)
Alkaline Phosphatase: 89 U/L (ref 40–150)
Anion gap: 8 (ref 3–11)
BUN: 13 mg/dL (ref 7–26)
CO2: 28 mmol/L (ref 22–29)
Calcium: 9.1 mg/dL (ref 8.4–10.4)
Chloride: 103 mmol/L (ref 98–109)
Creatinine, Ser: 0.78 mg/dL (ref 0.70–1.30)
GFR calc Af Amer: >60 mL/min (ref >60)
GFR calc non Af Amer: >60 mL/min (ref >60)
Glucose, Bld: 122 mg/dL (ref 70–140)
Potassium: 3.7 mmol/L (ref 3.5–5.1)
Sodium: 139 mmol/L (ref 136–145)
Total Bilirubin: 0.5 mg/dL (ref 0.2–1.2)
Total Protein: 6.3 g/dL — ABNORMAL LOW (ref 6.4–8.3)

## 2017-08-02 ENCOUNTER — Telehealth: Payer: Self-pay | Admitting: Medical Oncology

## 2017-08-02 ENCOUNTER — Other Ambulatory Visit: Payer: Self-pay | Admitting: *Deleted

## 2017-08-02 ENCOUNTER — Telehealth: Payer: Self-pay | Admitting: Internal Medicine

## 2017-08-02 NOTE — Telephone Encounter (Signed)
Pt agrees to blood transfusion for tomorrow.

## 2017-08-02 NOTE — Telephone Encounter (Signed)
Scheduled appt per 1/16 sch msg - spoke with patient and got him added for blood tomorrow

## 2017-08-03 ENCOUNTER — Inpatient Hospital Stay: Payer: BLUE CROSS/BLUE SHIELD

## 2017-08-03 ENCOUNTER — Other Ambulatory Visit: Payer: Medicare Other

## 2017-08-03 DIAGNOSIS — Z5112 Encounter for antineoplastic immunotherapy: Secondary | ICD-10-CM | POA: Diagnosis not present

## 2017-08-03 DIAGNOSIS — C3432 Malignant neoplasm of lower lobe, left bronchus or lung: Secondary | ICD-10-CM

## 2017-08-03 DIAGNOSIS — Z79899 Other long term (current) drug therapy: Secondary | ICD-10-CM | POA: Diagnosis not present

## 2017-08-03 DIAGNOSIS — R0609 Other forms of dyspnea: Secondary | ICD-10-CM | POA: Diagnosis not present

## 2017-08-03 DIAGNOSIS — D649 Anemia, unspecified: Secondary | ICD-10-CM

## 2017-08-03 LAB — COMPREHENSIVE METABOLIC PANEL
ALT: 30 U/L (ref 0–55)
AST: 28 U/L (ref 5–34)
Albumin: 3.4 g/dL — ABNORMAL LOW (ref 3.5–5.0)
Alkaline Phosphatase: 87 U/L (ref 40–150)
Anion gap: 9 (ref 3–11)
BUN: 13 mg/dL (ref 7–26)
CO2: 29 mmol/L (ref 22–29)
Calcium: 9 mg/dL (ref 8.4–10.4)
Chloride: 103 mmol/L (ref 98–109)
Creatinine, Ser: 0.78 mg/dL (ref 0.70–1.30)
GFR calc Af Amer: >60 mL/min (ref >60)
GFR calc non Af Amer: >60 mL/min (ref >60)
Glucose, Bld: 124 mg/dL (ref 70–140)
Potassium: 3.6 mmol/L (ref 3.5–5.1)
Sodium: 141 mmol/L (ref 136–145)
Total Bilirubin: 0.5 mg/dL (ref 0.2–1.2)
Total Protein: 6.2 g/dL — ABNORMAL LOW (ref 6.4–8.3)

## 2017-08-03 LAB — CBC WITH DIFFERENTIAL/PLATELET
Basophils Absolute: 0 10*3/uL (ref 0.0–0.1)
Basophils Relative: 0 %
Eosinophils Absolute: 0 10*3/uL (ref 0.0–0.5)
Eosinophils Relative: 3 %
HCT: 22.3 % — ABNORMAL LOW (ref 38.4–49.9)
Hemoglobin: 7.6 g/dL — ABNORMAL LOW (ref 13.0–17.1)
Lymphocytes Relative: 34 %
Lymphs Abs: 0.5 10*3/uL — ABNORMAL LOW (ref 0.9–3.3)
MCH: 36 pg — ABNORMAL HIGH (ref 27.2–33.4)
MCHC: 34.1 g/dL (ref 32.0–36.0)
MCV: 105.7 fL — ABNORMAL HIGH (ref 79.3–98.0)
Monocytes Absolute: 0.2 10*3/uL (ref 0.1–0.9)
Monocytes Relative: 17 %
Neutro Abs: 0.6 10*3/uL — ABNORMAL LOW (ref 1.5–6.5)
Neutrophils Relative %: 46 %
Platelets: 60 10*3/uL — ABNORMAL LOW (ref 140–400)
RBC: 2.11 MIL/uL — ABNORMAL LOW (ref 4.20–5.82)
RDW: 15.8 % — ABNORMAL HIGH (ref 11.0–15.6)
WBC: 1.4 10*3/uL — ABNORMAL LOW (ref 4.0–10.3)

## 2017-08-03 LAB — PREPARE RBC (CROSSMATCH)

## 2017-08-03 LAB — ABO/RH: ABO/RH(D): O POS

## 2017-08-03 MED ORDER — SODIUM CHLORIDE 0.9 % IV SOLN
250.0000 mL | Freq: Once | INTRAVENOUS | Status: AC
  Administered 2017-08-03: 250 mL via INTRAVENOUS

## 2017-08-03 MED ORDER — DIPHENHYDRAMINE HCL 25 MG PO CAPS
25.0000 mg | ORAL_CAPSULE | Freq: Once | ORAL | Status: AC
  Administered 2017-08-03: 25 mg via ORAL

## 2017-08-03 MED ORDER — ACETAMINOPHEN 325 MG PO TABS
650.0000 mg | ORAL_TABLET | Freq: Once | ORAL | Status: AC
  Administered 2017-08-03: 650 mg via ORAL

## 2017-08-03 MED ORDER — ACETAMINOPHEN 325 MG PO TABS
ORAL_TABLET | ORAL | Status: AC
Start: 2017-08-03 — End: 2017-08-03
  Filled 2017-08-03: qty 2

## 2017-08-03 MED ORDER — HEPARIN SOD (PORK) LOCK FLUSH 100 UNIT/ML IV SOLN
500.0000 [IU] | Freq: Every day | INTRAVENOUS | Status: AC | PRN
  Administered 2017-08-03: 500 [IU]
  Filled 2017-08-03: qty 5

## 2017-08-03 MED ORDER — DIPHENHYDRAMINE HCL 25 MG PO CAPS
ORAL_CAPSULE | ORAL | Status: AC
  Filled 2017-08-03: qty 1

## 2017-08-03 MED ORDER — SODIUM CHLORIDE 0.9% FLUSH
10.0000 mL | INTRAVENOUS | Status: AC | PRN
  Administered 2017-08-03: 10 mL
  Filled 2017-08-03: qty 10

## 2017-08-03 NOTE — Patient Instructions (Signed)

## 2017-08-04 ENCOUNTER — Encounter: Payer: Self-pay | Admitting: Internal Medicine

## 2017-08-04 LAB — TYPE AND SCREEN
ABO/RH(D): O POS
Antibody Screen: NEGATIVE
Unit division: 0
Unit division: 0

## 2017-08-04 LAB — BPAM RBC
Blood Product Expiration Date: 201902142359
Blood Product Expiration Date: 201902152359
ISSUE DATE / TIME: 201901181152
ISSUE DATE / TIME: 201901181152
Unit Type and Rh: 5100
Unit Type and Rh: 5100

## 2017-08-06 ENCOUNTER — Encounter: Payer: Self-pay | Admitting: Medical Oncology

## 2017-08-07 ENCOUNTER — Telehealth: Payer: Self-pay | Admitting: Medical Oncology

## 2017-08-07 NOTE — Telephone Encounter (Signed)
err

## 2017-08-08 ENCOUNTER — Ambulatory Visit: Payer: Medicare Other

## 2017-08-08 ENCOUNTER — Other Ambulatory Visit: Payer: Medicare Other

## 2017-08-08 ENCOUNTER — Ambulatory Visit: Payer: Medicare Other | Admitting: Internal Medicine

## 2017-08-15 ENCOUNTER — Telehealth: Payer: Self-pay | Admitting: Oncology

## 2017-08-15 ENCOUNTER — Inpatient Hospital Stay: Payer: BLUE CROSS/BLUE SHIELD

## 2017-08-15 ENCOUNTER — Encounter: Payer: Self-pay | Admitting: Oncology

## 2017-08-15 ENCOUNTER — Inpatient Hospital Stay (HOSPITAL_BASED_OUTPATIENT_CLINIC_OR_DEPARTMENT_OTHER): Payer: BLUE CROSS/BLUE SHIELD | Admitting: Oncology

## 2017-08-15 VITALS — BP 121/55 | HR 63 | Temp 98.2°F | Resp 18 | Ht 71.0 in | Wt 213.4 lb

## 2017-08-15 DIAGNOSIS — C3432 Malignant neoplasm of lower lobe, left bronchus or lung: Secondary | ICD-10-CM

## 2017-08-15 DIAGNOSIS — Z5112 Encounter for antineoplastic immunotherapy: Secondary | ICD-10-CM

## 2017-08-15 DIAGNOSIS — R0609 Other forms of dyspnea: Secondary | ICD-10-CM | POA: Diagnosis not present

## 2017-08-15 DIAGNOSIS — D649 Anemia, unspecified: Secondary | ICD-10-CM | POA: Diagnosis not present

## 2017-08-15 DIAGNOSIS — Z79899 Other long term (current) drug therapy: Secondary | ICD-10-CM | POA: Diagnosis not present

## 2017-08-15 DIAGNOSIS — Z5111 Encounter for antineoplastic chemotherapy: Secondary | ICD-10-CM

## 2017-08-15 DIAGNOSIS — R5382 Chronic fatigue, unspecified: Secondary | ICD-10-CM

## 2017-08-15 LAB — CBC WITH DIFFERENTIAL/PLATELET
Basophils Absolute: 0 10*3/uL (ref 0.0–0.1)
Basophils Relative: 0 %
Eosinophils Absolute: 0 10*3/uL (ref 0.0–0.5)
Eosinophils Relative: 0 %
HCT: 33.3 % — ABNORMAL LOW (ref 38.4–49.9)
Hemoglobin: 11.2 g/dL — ABNORMAL LOW (ref 13.0–17.1)
Lymphocytes Relative: 9 %
Lymphs Abs: 0.4 10*3/uL — ABNORMAL LOW (ref 0.9–3.3)
MCH: 35 pg — ABNORMAL HIGH (ref 27.2–33.4)
MCHC: 33.7 g/dL (ref 32.0–36.0)
MCV: 103.9 fL — ABNORMAL HIGH (ref 79.3–98.0)
Monocytes Absolute: 0.3 10*3/uL (ref 0.1–0.9)
Monocytes Relative: 9 %
Neutro Abs: 3.4 10*3/uL (ref 1.5–6.5)
Neutrophils Relative %: 82 %
Platelets: 173 10*3/uL (ref 140–400)
RBC: 3.2 MIL/uL — ABNORMAL LOW (ref 4.20–5.82)
RDW: 22.6 % — ABNORMAL HIGH (ref 11.0–14.6)
WBC: 4.1 10*3/uL (ref 4.0–10.3)

## 2017-08-15 LAB — COMPREHENSIVE METABOLIC PANEL
ALT: 19 U/L (ref 0–55)
AST: 23 U/L (ref 5–34)
Albumin: 3.8 g/dL (ref 3.5–5.0)
Alkaline Phosphatase: 96 U/L (ref 40–150)
Anion gap: 10 (ref 3–11)
BUN: 15 mg/dL (ref 7–26)
CO2: 28 mmol/L (ref 22–29)
Calcium: 9.7 mg/dL (ref 8.4–10.4)
Chloride: 100 mmol/L (ref 98–109)
Creatinine, Ser: 0.93 mg/dL (ref 0.70–1.30)
GFR calc Af Amer: >60 mL/min (ref >60)
GFR calc non Af Amer: >60 mL/min (ref >60)
Glucose, Bld: 173 mg/dL — ABNORMAL HIGH (ref 70–140)
Potassium: 3.9 mmol/L (ref 3.5–5.1)
Sodium: 138 mmol/L (ref 136–145)
Total Bilirubin: 0.5 mg/dL (ref 0.2–1.2)
Total Protein: 7.1 g/dL (ref 6.4–8.3)

## 2017-08-15 LAB — TSH: TSH: 0.16 u[IU]/mL — ABNORMAL LOW (ref 0.320–4.118)

## 2017-08-15 MED ORDER — SODIUM CHLORIDE 0.9 % IV SOLN
500.0000 mg/m2 | Freq: Once | INTRAVENOUS | Status: AC
  Administered 2017-08-15: 1100 mg via INTRAVENOUS
  Filled 2017-08-15: qty 40

## 2017-08-15 MED ORDER — SODIUM CHLORIDE 0.9 % IV SOLN
Freq: Once | INTRAVENOUS | Status: AC
Start: 2017-08-15 — End: 2017-08-15
  Administered 2017-08-15: 12:00:00 via INTRAVENOUS

## 2017-08-15 MED ORDER — DIPHENHYDRAMINE HCL 50 MG/ML IJ SOLN
50.0000 mg | Freq: Once | INTRAMUSCULAR | Status: DC
Start: 2017-08-15 — End: 2017-08-15

## 2017-08-15 MED ORDER — SODIUM CHLORIDE 0.9 % IV SOLN: Freq: Once | INTRAVENOUS | Status: DC

## 2017-08-15 MED ORDER — PROCHLORPERAZINE MALEATE 10 MG PO TABS
10.0000 mg | ORAL_TABLET | Freq: Once | ORAL | Status: AC
  Administered 2017-08-15: 10 mg via ORAL

## 2017-08-15 MED ORDER — HEPARIN SOD (PORK) LOCK FLUSH 100 UNIT/ML IV SOLN
500.0000 [IU] | Freq: Once | INTRAVENOUS | Status: AC | PRN
  Administered 2017-08-15: 500 [IU]
  Filled 2017-08-15: qty 5

## 2017-08-15 MED ORDER — SODIUM CHLORIDE 0.9% FLUSH
10.0000 mL | INTRAVENOUS | Status: DC | PRN
  Administered 2017-08-15: 10 mL
  Filled 2017-08-15: qty 10

## 2017-08-15 MED ORDER — SODIUM CHLORIDE 0.9 % IV SOLN
200.0000 mg | Freq: Once | INTRAVENOUS | Status: AC
  Administered 2017-08-15: 200 mg via INTRAVENOUS
  Filled 2017-08-15: qty 8

## 2017-08-15 MED ORDER — FAMOTIDINE IN NACL 20-0.9 MG/50ML-% IV SOLN: 20.0000 mg | Freq: Once | INTRAVENOUS | Status: DC

## 2017-08-15 MED ORDER — PROCHLORPERAZINE MALEATE 10 MG PO TABS
ORAL_TABLET | ORAL | Status: AC
  Filled 2017-08-15: qty 1

## 2017-08-15 NOTE — Telephone Encounter (Signed)
Scheduled appt per 1/30 los - Patient to get an updated schedule in the treatment area.

## 2017-08-15 NOTE — Patient Instructions (Signed)
[facility] Discharge Instructions for Patients Receiving Chemotherapy  Today you received the following chemotherapy agents Keytruda and Alimta.  To help prevent nausea and vomiting after your treatment, we encourage you to take your nausea medication as directed.  If you develop nausea and vomiting that is not controlled by your nausea medication, call the clinic.   BELOW ARE SYMPTOMS THAT SHOULD BE REPORTED IMMEDIATELY:  *FEVER GREATER THAN 100.5 F  *CHILLS WITH OR WITHOUT FEVER  NAUSEA AND VOMITING THAT IS NOT CONTROLLED WITH YOUR NAUSEA MEDICATION  *UNUSUAL SHORTNESS OF BREATH  *UNUSUAL BRUISING OR BLEEDING  TENDERNESS IN MOUTH AND THROAT WITH OR WITHOUT PRESENCE OF ULCERS  *URINARY PROBLEMS  *BOWEL PROBLEMS  UNUSUAL RASH Items with * indicate a potential emergency and should be followed up as soon as possible.  Feel free to call the clinic should you have any questions or concerns. The clinic phone number is [phone].  Please show the [facility] at check-in to the Emergency Department and triage nurse.

## 2017-08-15 NOTE — Progress Notes (Signed)
[facility] OFFICE PROGRESS NOTE  [name], MD [place] [address]  DIAGNOSIS: Recurrent non-small cell lung cancer initially diagnosed as Stage IIIB (T1b, N3, M0) non-small cell lung cancer favoring adenocarcinoma presented with left lower lobe pulmonary nodule in addition to left hilar and bilateral mediastinal lymphadenopathy in addition to right supraclavicular lymph nodes diagnosed in April 2017.  PRIOR THERAPY: 1) Concurrent chemoradiation with weekly carboplatin for AUC of 2 and paclitaxel 45 mg/M2 started on 11/15/2015. He is status post 6 cycles. Last dose was getting 12/20/2015 with partial response. 2) Consolidation chemotherapy with carboplatin for AUC of 5 and paclitaxel 175 MG/M2 every 3 weeks with Neulasta support. Status post 3cycles. Last dose was given 04/03/2016.  CURRENT THERAPY: systemic chemotherapy with carboplatin for AUC of 5, Alimta 500 MG/M2 and Keytruda 200 MG IV every 3 weeks. First dose 05/16/2017.Status post 4 cycles.  The patient will receive Alimta 500 mg meter squared and Keytruda 200 mg IV every 3 weeks beginning with cycle #5.  INTERVAL HISTORY: Brian White 67 y.o. male returns for routine follow-up visit accompanied by his daughter.  The patient is feeling fine today has no specific complaints.  The patient delayed his treatment by 1 week so that [place] had [place]  He has been able to stay active overall.  The patient denies fevers and chills.  Denies chest pain, shortness of breath at rest, cough, hemoptysis.  Reports mild dyspnea on exertion.  Denies nausea, vomiting, cons patient, diarrhea.  The patient denies any recent weight loss or night sweats.  The patient is here for evaluation prior to cycle #5 of his treatment.  MEDICAL HISTORY: Past Medical History:  Diagnosis Date  . Adenocarcinoma, lung ([place]) 10/08/2015   2.4cm spiculated LLL nodule suspicious for primary  bronchogenic carcinoma by CT 09/2015 - adenocarcinoma by endobronchial biopsy  . Allergy   . Arthritis   . CAD (coronary artery disease)    mild MI; pt unaware - seen on stress test  . COPD (chronic obstructive pulmonary disease) ([place] [facility]) 06/26/2011   Mild centrilobular and paraseptal emphysema by CT 09/2015   . ED (erectile dysfunction)   . H/O anaphylactic shock 2013   hives,hypotension,syncope  . H/O seasonal allergies   . Herpes zoster 12/06/2015  . Hypertension   . Polycythemia 01/25/2014  . Prostate cancer [facility]) 2013   s/p radiation therapy March 2013, planned f/u with uro after XRT  . Radiation 09/18/2011 through 11/10/2011   Prostate 7600 cGy 40 sessions, seminal vesicles 5600 cGy 40 sessions    . Radiation 11/10/15-12/22/15   chest 60 Gy  . Shortness of breath dyspnea   . Smoker   . Thoracic aorta atherosclerosis ([place]) 10/08/2015   Mild by CT 09/2015     ALLERGIES:  is allergic to onion.  MEDICATIONS:  Current Outpatient Medications  Medication Sig Dispense Refill  . aspirin 81 MG tablet Take 81 mg by mouth daily. Reported on 12/21/2015    . budesonide-formoterol (SYMBICORT) 160-4.5 MCG/ACT inhaler Inhale 1 puff into the lungs 2 (two) times daily. 3 Inhaler 3  . cetirizine (ZYRTEC) 10 MG chewable tablet Chew 10 mg by mouth daily.    Marland Kitchen dexamethasone (DECADRON) 4 MG tablet 4 mg by mouth twice a day the day before, day of and after the chemotherapy every 3 weeks. 40 tablet 0  . folic acid (FOLVITE) 1 MG tablet Take 1 tablet (1 mg total) by mouth  daily. 30 tablet 4  . guaiFENesin (MUCINEX) 600 MG 12 hr tablet Take by mouth 2 (two) times daily.    Marland Kitchen ibuprofen (ADVIL,MOTRIN) 200 MG tablet Take 600 mg by mouth every 6 (six) hours as needed (Pain). Reported on 12/21/2015    . lidocaine-prilocaine (EMLA) cream Apply 1 application topically as needed. Squeeze a  small amount of cream to cotton ball and apply to port site 1-2 hours prior to chemotherapy. Do not rub in the  cream and cover  with plastic wrap. 30 g 0  . losartan-hydrochlorothiazide (HYZAAR) 100-25 MG tablet Take 1 tablet daily by mouth. 90 tablet 3  . prochlorperazine (COMPAZINE) 10 MG tablet Take 1 tablet (10 mg total) by mouth every 6 (six) hours as needed for nausea or vomiting. 30 tablet 0   No current facility-administered medications for this visit.    Facility-Administered Medications Ordered in Other Visits  Medication Dose Route Frequency Provider Last Rate Last Dose  . sodium chloride flush (NS) 0.9 % injection 10 mL  10 mL Intracatheter PRN [name], MD   10 mL at 08/15/17 1408    SURGICAL HISTORY:  Past Surgical History:  Procedure Laterality Date  . COLONOSCOPY W/ BIOPSIES AND POLYPECTOMY  10/11/15   mult TAs, 3cm rectal polypoid lesion; rec further treatment, diverticulosis ([place])  . ENDOBRONCHIAL ULTRASOUND Bilateral 10/18/2015   Procedure: ENDOBRONCHIAL ULTRASOUND;  Surgeon: Javier Glazier, MD;  Location: [facility] ENDOSCOPY;  Service: Cardiopulmonary;  Laterality: Bilateral;  . HERNIA REPAIR Right 2005   with mesh  . INSERTION PROSTATE RADIATION SEED  08/2011   prostate cancer, [place]  . IR FLUORO GUIDE PORT INSERTION RIGHT  07/26/2017  . IR US GUIDE VASC ACCESS RIGHT  07/26/2017    REVIEW OF SYSTEMS:   Review of Systems  Constitutional: Negative for appetite change, chills, fatigue, fever and unexpected weight change.  HENT:   Negative for mouth sores, nosebleeds, sore throat and trouble swallowing.   Eyes: Negative for eye problems and icterus.  Respiratory: Negative for cough, hemoptysis, shortness of breath at rest and wheezing.  Positive for shortness of breath with exertion Cardiovascular: Negative for chest pain and leg swelling.  Gastrointestinal: Negative for abdominal pain, constipation, diarrhea, nausea and vomiting.  Genitourinary: Negative for bladder incontinence, difficulty urinating, dysuria, frequency and hematuria.   Musculoskeletal: Negative  for back pain, gait problem, neck pain and neck stiffness.  Skin: Negative for itching and rash.  Neurological: Negative for dizziness, extremity weakness, gait problem, headaches, light-headedness and seizures.  Hematological: Negative for adenopathy. Does not bruise/bleed easily.  Psychiatric/Behavioral: Negative for confusion, depression and sleep disturbance. The patient is not nervous/anxious.     PHYSICAL EXAMINATION:  Blood pressure (!) 121/55, pulse 63, temperature 98.2 F (36.8 C), temperature source Oral, resp. rate 18, height _0  (1.803 m), weight 213 lb 6.4 oz (96.8 kg), SpO2 100 %.  ECOG PERFORMANCE STATUS: 1 - Symptomatic but completely ambulatory  Physical Exam  Constitutional: Oriented to person, place, and time and well-developed, well-nourished, and in no distress. No distress.  HENT:  Head: Normocephalic and atraumatic.  Mouth/Throat: Oropharynx is clear and moist. No oropharyngeal exudate.  Eyes: Conjunctivae are normal. Right eye exhibits no discharge. Left eye exhibits no discharge. No scleral icterus.  Neck: Normal range of motion. Neck supple.  Cardiovascular: Normal rate, regular rhythm, normal heart sounds and intact distal pulses.   Pulmonary/Chest: Effort normal and breath sounds normal. No respiratory distress. No wheezes. No rales.  Abdominal: Soft. Bowel sounds are  normal. Exhibits no distension and no mass. There is no tenderness.  Musculoskeletal: Normal range of motion. Exhibits no edema.  Lymphadenopathy:    No cervical adenopathy.  Neurological: Alert and oriented to person, place, and time. Exhibits normal muscle tone. Gait normal. Coordination normal.  Skin: Skin is warm and dry. No rash noted. Not diaphoretic. No erythema. No pallor.  Psychiatric: Mood, memory and judgment normal.  Vitals reviewed.  LABORATORY DATA: Lab Results  Component Value Date   WBC 4.1 08/15/2017   HGB 11.2 (L) 08/15/2017   HCT 33.3 (L) 08/15/2017   MCV 103.9 (H)  08/15/2017   PLT 173 08/15/2017      Chemistry      Component Value Date/Time   NA 138 08/15/2017 1019   NA 138 07/18/2017 1000   K 3.9 08/15/2017 1019   K 4.0 07/18/2017 1000   CL 100 08/15/2017 1019   CO2 28 08/15/2017 1019   CO2 25 07/18/2017 1000   BUN 15 08/15/2017 1019   BUN 14.9 07/18/2017 1000   CREATININE 0.93 08/15/2017 1019   CREATININE 0.8 07/18/2017 1000      Component Value Date/Time   CALCIUM 9.7 08/15/2017 1019   CALCIUM 9.7 07/18/2017 1000   ALKPHOS 96 08/15/2017 1019   ALKPHOS 93 07/18/2017 1000   AST 23 08/15/2017 1019   AST 22 07/18/2017 1000   ALT 19 08/15/2017 1019   ALT 24 07/18/2017 1000   BILITOT 0.5 08/15/2017 1019   BILITOT 0.54 07/18/2017 1000       RADIOGRAPHIC STUDIES:  Ir US Guide Vasc Access Right  Result Date: 07/26/2017 INDICATION: History of lung cancer, in need of durable intravenous access for chemotherapy administration. EXAM: IMPLANTED PORT A CATH PLACEMENT WITH ULTRASOUND AND FLUOROSCOPIC GUIDANCE COMPARISON:  PET-CT - 03/08/2017 MEDICATIONS: Ancef 2 gm IV; The antibiotic was administered within an appropriate time interval prior to skin puncture. ANESTHESIA/SEDATION: Moderate (conscious) sedation was employed during this procedure. A total of Versed 3 mg and Fentanyl 100 mcg was administered intravenously. Moderate Sedation Time: 22 minutes. The patient's level of consciousness and vital signs were monitored continuously by radiology nursing throughout the procedure under my direct supervision. CONTRAST:  None FLUOROSCOPY TIME:  30 seconds (8.6 mGy) COMPLICATIONS: None immediate. PROCEDURE: The procedure, risks, benefits, and alternatives were explained to the patient. Questions regarding the procedure were encouraged and answered. The patient understands and consents to the procedure. The right neck and chest were prepped with chlorhexidine in a sterile fashion, and a sterile drape was applied covering the operative field. Maximum  barrier sterile technique with sterile gowns and gloves were used for the procedure. A timeout was performed prior to the initiation of the procedure. Local anesthesia was provided with 1% lidocaine with epinephrine. After creating a small venotomy incision, a micropuncture kit was utilized to access the internal jugular vein. Real-time ultrasound guidance was utilized for vascular access including the acquisition of a permanent ultrasound image documenting patency of the accessed vessel. The microwire was utilized to measure appropriate catheter length. A subcutaneous port pocket was then created along the upper chest wall utilizing a combination of sharp and blunt dissection. The pocket was irrigated with sterile saline. A single lumen thin power injectable port was chosen for placement. The 8 Fr catheter was tunneled from the port pocket site to the venotomy incision. The port was placed in the pocket. The external catheter was trimmed to appropriate length. At the venotomy, an 8 Fr peel-away sheath was placed over a  guidewire under fluoroscopic guidance. The catheter was then placed through the sheath and the sheath was removed. Final catheter positioning was confirmed and documented with a fluoroscopic spot radiograph. The port was accessed with a Huber needle, aspirated and flushed with heparinized saline. The venotomy site was closed with an interrupted 4-0 Vicryl suture. The port pocket incision was closed with interrupted 2-0 Vicryl suture and the skin was opposed with a running subcuticular 4-0 Vicryl suture. Dermabond and Steri-strips were applied to both incisions. Dressings were placed. The patient tolerated the procedure well without immediate post procedural complication. FINDINGS: After catheter placement, the tip lies within the superior cavoatrial junction. The catheter aspirates and flushes normally and is ready for immediate use. IMPRESSION: Successful placement of a right internal jugular  approach power injectable Port-A-Cath. The catheter is ready for immediate use. Electronically Signed   By: Sandi Mariscal M.D.   On: 07/26/2017 10:47   Ir Fluoro Guide Port Insertion Right  Result Date: 07/26/2017 INDICATION: History of lung cancer, in need of durable intravenous access for chemotherapy administration. EXAM: IMPLANTED PORT A CATH PLACEMENT WITH ULTRASOUND AND FLUOROSCOPIC GUIDANCE COMPARISON:  PET-CT - 03/08/2017 MEDICATIONS: Ancef 2 gm IV; The antibiotic was administered within an appropriate time interval prior to skin puncture. ANESTHESIA/SEDATION: Moderate (conscious) sedation was employed during this procedure. A total of Versed 3 mg and Fentanyl 100 mcg was administered intravenously. Moderate Sedation Time: 22 minutes. The patient's level of consciousness and vital signs were monitored continuously by radiology nursing throughout the procedure under my direct supervision. CONTRAST:  None FLUOROSCOPY TIME:  30 seconds (8.6 mGy) COMPLICATIONS: None immediate. PROCEDURE: The procedure, risks, benefits, and alternatives were explained to the patient. Questions regarding the procedure were encouraged and answered. The patient understands and consents to the procedure. The right neck and chest were prepped with chlorhexidine in a sterile fashion, and a sterile drape was applied covering the operative field. Maximum barrier sterile technique with sterile gowns and gloves were used for the procedure. A timeout was performed prior to the initiation of the procedure. Local anesthesia was provided with 1% lidocaine with epinephrine. After creating a small venotomy incision, a micropuncture kit was utilized to access the internal jugular vein. Real-time ultrasound guidance was utilized for vascular access including the acquisition of a permanent ultrasound image documenting patency of the accessed vessel. The microwire was utilized to measure appropriate catheter length. A subcutaneous port pocket was  then created along the upper chest wall utilizing a combination of sharp and blunt dissection. The pocket was irrigated with sterile saline. A single lumen thin power injectable port was chosen for placement. The 8 Fr catheter was tunneled from the port pocket site to the venotomy incision. The port was placed in the pocket. The external catheter was trimmed to appropriate length. At the venotomy, an 8 Fr peel-away sheath was placed over a guidewire under fluoroscopic guidance. The catheter was then placed through the sheath and the sheath was removed. Final catheter positioning was confirmed and documented with a fluoroscopic spot radiograph. The port was accessed with a Huber needle, aspirated and flushed with heparinized saline. The venotomy site was closed with an interrupted 4-0 Vicryl suture. The port pocket incision was closed with interrupted 2-0 Vicryl suture and the skin was opposed with a running subcuticular 4-0 Vicryl suture. Dermabond and Steri-strips were applied to both incisions. Dressings were placed. The patient tolerated the procedure well without immediate post procedural complication. FINDINGS: After catheter placement, the tip lies within the  superior cavoatrial junction. The catheter aspirates and flushes normally and is ready for immediate use. IMPRESSION: Successful placement of a right internal jugular approach power injectable Port-A-Cath. The catheter is ready for immediate use. Electronically Signed   By: Sandi Mariscal M.D.   On: 07/26/2017 10:47     ASSESSMENT/PLAN:  Cancer of lower lobe of left lung [facility] This is a very pleasant 67 year old white male with recurrent non-small cell lung cancer initially diagnosed as stage IIIB non-small cell lung cancer favoring adenocarcinoma status post concurrent chemoradiation followed by consolidation chemotherapy. His last treatment was in September 2017. Recent imaging studies showed evidence for disease recurrence. The patient received  systemic chemotherapy with carboplatin, Alimta and Keytruda status post4cycleswhich he tolerated fairly well.  The patient was seen with Dr. Julien Nordmann.    Discussed with the patient and his daughter that beginning with cycle 5 which will start today he will receive Alimta and Keytruda only.  Recommend that he proceed with Alimta and Keytruda today as scheduled.  He will not need weekly lab appointments.  Patient will have a follow-up visit in 3 weeks for evaluation prior to his next cycle of treatment.  The patient was advised to call immediately if he has any concerning symptoms in the interval. The patient voices understanding of current disease status and treatment options and is in agreement with the current care plan. All questions were answered. The patient knows to call the clinic with any problems, questions or concerns. We can certainly see the patient much sooner if necessary.  Orders Placed This Encounter  Procedures  . CBC with Differential (Cancer Center Only)    Standing Status:   Standing    Number of Occurrences:   20    Standing Expiration Date:   08/15/2018  . CMP ([facility] only)    Standing Status:   Standing    Number of Occurrences:   20    Standing Expiration Date:   08/15/2018    Brian Bussing, DNP, AGPCNP-BC, AOCNP 08/15/17  ADDENDUM: Hematology/Oncology Attending: I had a face-to-face encounter with the patient today.  I recommended his care plan.  This is a very pleasant 67 years old white male with recurrent non-small cell lung cancer, adenocarcinoma.  He is currently undergoing systemic chemotherapy with carboplatin, Alimta and Keytruda status post 4 cycles and has been tolerating this treatment fairly well. I recommended for him to start the maintenance treatment with Alimta and Keytruda starting with cycle #5 today. We will see him every 3 weeks for evaluation with every chemotherapy cycle. He was advised to call immediately if he has any concerning  symptoms in the interval.  Disclaimer: This note was dictated with voice recognition software. Similar sounding words can inadvertently be transcribed and may be missed upon review. Brian Kempf, MD 08/15/17

## 2017-08-15 NOTE — Assessment & Plan Note (Signed)
This is a very pleasant 67 year old white male with recurrent non-small cell lung cancer initially diagnosed as stage IIIB non-small cell lung cancer favoring adenocarcinoma status post concurrent chemoradiation followed by consolidation chemotherapy. His last treatment was in September 2017. Recent imaging studies showed evidence for disease recurrence. The patient received systemic chemotherapy with carboplatin, Alimta and Keytruda status post4cycleswhich he tolerated fairly well.  The patient was seen with Dr. Julien Nordmann.    Discussed with the patient and his daughter that beginning with cycle 5 which will start today he will receive Alimta and Keytruda only.  Recommend that he proceed with Alimta and Keytruda today as scheduled.  He will not need weekly lab appointments.  Patient will have a follow-up visit in 3 weeks for evaluation prior to his next cycle of treatment.  The patient was advised to call immediately if he has any concerning symptoms in the interval. The patient voices understanding of current disease status and treatment options and is in agreement with the current care plan. All questions were answered. The patient knows to call the clinic with any problems, questions or concerns. We can certainly see the patient much sooner if necessary.

## 2017-08-22 ENCOUNTER — Other Ambulatory Visit: Payer: Medicare Other

## 2017-08-29 ENCOUNTER — Other Ambulatory Visit: Payer: Medicare Other

## 2017-09-05 ENCOUNTER — Inpatient Hospital Stay: Payer: BLUE CROSS/BLUE SHIELD

## 2017-09-05 ENCOUNTER — Encounter: Payer: Self-pay | Admitting: *Deleted

## 2017-09-05 ENCOUNTER — Inpatient Hospital Stay: Payer: BLUE CROSS/BLUE SHIELD | Attending: Internal Medicine

## 2017-09-05 ENCOUNTER — Encounter: Payer: Self-pay | Admitting: Internal Medicine

## 2017-09-05 ENCOUNTER — Inpatient Hospital Stay (HOSPITAL_BASED_OUTPATIENT_CLINIC_OR_DEPARTMENT_OTHER): Payer: BLUE CROSS/BLUE SHIELD | Admitting: Internal Medicine

## 2017-09-05 VITALS — BP 116/50 | HR 70 | Temp 98.4°F | Resp 13 | Wt 209.4 lb

## 2017-09-05 DIAGNOSIS — C3432 Malignant neoplasm of lower lobe, left bronchus or lung: Secondary | ICD-10-CM

## 2017-09-05 DIAGNOSIS — G4700 Insomnia, unspecified: Secondary | ICD-10-CM | POA: Diagnosis not present

## 2017-09-05 DIAGNOSIS — R5382 Chronic fatigue, unspecified: Secondary | ICD-10-CM

## 2017-09-05 DIAGNOSIS — Z5112 Encounter for antineoplastic immunotherapy: Secondary | ICD-10-CM | POA: Diagnosis not present

## 2017-09-05 DIAGNOSIS — Z5111 Encounter for antineoplastic chemotherapy: Secondary | ICD-10-CM

## 2017-09-05 LAB — CBC WITH DIFFERENTIAL (CANCER CENTER ONLY)
Basophils Absolute: 0 10*3/uL (ref 0.0–0.1)
Basophils Relative: 0 %
Eosinophils Absolute: 0 10*3/uL (ref 0.0–0.5)
Eosinophils Relative: 0 %
HCT: 31.6 % — ABNORMAL LOW (ref 38.4–49.9)
Hemoglobin: 10.5 g/dL — ABNORMAL LOW (ref 13.0–17.1)
Lymphocytes Relative: 13 %
Lymphs Abs: 0.4 10*3/uL — ABNORMAL LOW (ref 0.9–3.3)
MCH: 35.2 pg — ABNORMAL HIGH (ref 27.2–33.4)
MCHC: 33.2 g/dL (ref 32.0–36.0)
MCV: 106 fL — ABNORMAL HIGH (ref 79.3–98.0)
Monocytes Absolute: 0.3 10*3/uL (ref 0.1–0.9)
Monocytes Relative: 10 %
Neutro Abs: 2.5 10*3/uL (ref 1.5–6.5)
Neutrophils Relative %: 77 %
Platelet Count: 175 10*3/uL (ref 140–400)
RBC: 2.98 MIL/uL — ABNORMAL LOW (ref 4.20–5.82)
RDW: 22.6 % — ABNORMAL HIGH (ref 11.0–14.6)
WBC Count: 3.2 10*3/uL — ABNORMAL LOW (ref 4.0–10.3)

## 2017-09-05 LAB — CMP (CANCER CENTER ONLY)
ALT: 33 U/L (ref 0–55)
AST: 27 U/L (ref 5–34)
Albumin: 3.9 g/dL (ref 3.5–5.0)
Alkaline Phosphatase: 90 U/L (ref 40–150)
Anion gap: 10 (ref 3–11)
BUN: 11 mg/dL (ref 7–26)
CO2: 26 mmol/L (ref 22–29)
Calcium: 9.7 mg/dL (ref 8.4–10.4)
Chloride: 105 mmol/L (ref 98–109)
Creatinine: 0.81 mg/dL (ref 0.70–1.30)
GFR, Est AFR Am: >60 mL/min (ref >60)
GFR, Estimated: >60 mL/min (ref >60)
Glucose, Bld: 112 mg/dL (ref 70–140)
Potassium: 4.1 mmol/L (ref 3.5–5.1)
Sodium: 141 mmol/L (ref 136–145)
Total Bilirubin: 0.5 mg/dL (ref 0.2–1.2)
Total Protein: 6.8 g/dL (ref 6.4–8.3)

## 2017-09-05 LAB — TSH: TSH: 0.283 u[IU]/mL — ABNORMAL LOW (ref 0.320–4.118)

## 2017-09-05 MED ORDER — SODIUM CHLORIDE 0.9 % IV SOLN
500.0000 mg/m2 | Freq: Once | INTRAVENOUS | Status: AC
  Administered 2017-09-05: 1100 mg via INTRAVENOUS
  Filled 2017-09-05: qty 40

## 2017-09-05 MED ORDER — SODIUM CHLORIDE 0.9 % IV SOLN
Freq: Once | INTRAVENOUS | Status: AC
  Administered 2017-09-05: 12:00:00 via INTRAVENOUS

## 2017-09-05 MED ORDER — PROCHLORPERAZINE MALEATE 10 MG PO TABS
10.0000 mg | ORAL_TABLET | Freq: Once | ORAL | Status: AC
  Administered 2017-09-05: 10 mg via ORAL

## 2017-09-05 MED ORDER — PROCHLORPERAZINE MALEATE 10 MG PO TABS
ORAL_TABLET | ORAL | Status: AC
  Filled 2017-09-05: qty 1

## 2017-09-05 MED ORDER — SODIUM CHLORIDE 0.9 % IV SOLN
200.0000 mg | Freq: Once | INTRAVENOUS | Status: AC
  Administered 2017-09-05: 200 mg via INTRAVENOUS
  Filled 2017-09-05: qty 8

## 2017-09-05 MED ORDER — SODIUM CHLORIDE 0.9% FLUSH
10.0000 mL | INTRAVENOUS | Status: DC | PRN
  Administered 2017-09-05: 10 mL via INTRAVENOUS
  Filled 2017-09-05: qty 10

## 2017-09-05 MED ORDER — HEPARIN SOD (PORK) LOCK FLUSH 100 UNIT/ML IV SOLN
500.0000 [IU] | Freq: Once | INTRAVENOUS | Status: AC | PRN
  Administered 2017-09-05: 500 [IU]
  Filled 2017-09-05: qty 5

## 2017-09-05 MED ORDER — SODIUM CHLORIDE 0.9% FLUSH
10.0000 mL | INTRAVENOUS | Status: DC | PRN
  Administered 2017-09-05: 10 mL
  Filled 2017-09-05: qty 10

## 2017-09-05 NOTE — Progress Notes (Signed)
Oncology Nurse Navigator Documentation  Oncology Nurse Navigator Flowsheets 09/05/2017  Navigator Location [facility]-  Navigator Encounter Type Clinic/MDC/I spoke with patient and wife today.  I helped to explain his treatment plan.  He verbalized understanding  Patient Visit Type MedOnc  Treatment Phase Treatment  Barriers/Navigation Needs Education  Education Other  Interventions Education  Education Method Verbal  Acuity Level 2  Time Spent with Patient 30

## 2017-09-05 NOTE — Progress Notes (Signed)
[place] [phone]   [phone]  OFFICE PROGRESS NOTE  [name], MD [place] [address]  DIAGNOSIS: recurrent non-small cell lung cancer initially diagnosed as Stage IIIB (T1b, N3, M0) non-small cell lung cancer favoring adenocarcinoma presented with left lower lobe pulmonary nodule in addition to left hilar and bilateral mediastinal lymphadenopathy in addition to right supraclavicular lymph nodes diagnosed in April 2017.  PRIOR THERAPY: 1) Concurrent chemoradiation with weekly carboplatin for AUC of 2 and paclitaxel 45 mg/M2 started on 11/15/2015. He is status post 6 cycles. Last dose was getting 12/20/2015 with partial response. 2) Consolidation chemotherapy with carboplatin for AUC of 5 and paclitaxel 175 MG/M2 every 3 weeks with Neulasta support. Status post 3 cycles. Last dose was given 04/03/2016.  CURRENT THERAPY: systemic chemotherapy with carboplatin for AUC of 5, Alimta 500 MG/M2 and Ketruda 200 MG IV every 3 weeks. First dose 05/16/2017.  Status post 5 cycle.  INTERVAL HISTORY: Brian White 67 y.o. male returns to the clinic today for follow-up visit accompanied by his wife.  The patient the patient is feeling fine today with no specific complaints except for insomnia.  He tried over the counter medication with no improvement.  He sleeps a lot during the daytime.  He denied having any chest pain, shortness of breath, cough or hemoptysis.  He denied having any nausea, vomiting, diarrhea or constipation.  He lost a few pounds since his last visit.  He continues to tolerate his treatment with Alimta and Keytruda fairly well.  He is here for evaluation before starting cycle #6.  MEDICAL HISTORY: Past Medical History:  Diagnosis Date  . Adenocarcinoma, lung ([place]) 10/08/2015   2.4cm spiculated LLL nodule suspicious for primary bronchogenic carcinoma by CT 09/2015 - adenocarcinoma by endobronchial biopsy  .  Allergy   . Arthritis   . CAD (coronary artery disease)    mild MI; pt unaware - seen on stress test  . COPD (chronic obstructive pulmonary disease) ([place]) 06/26/2011   Mild centrilobular and paraseptal emphysema by CT 09/2015   . ED (erectile dysfunction)   . H/O anaphylactic shock 2013   hives,hypotension,syncope  . H/O seasonal allergies   . Herpes zoster 12/06/2015  . Hypertension   . Polycythemia 01/25/2014  . Prostate cancer [facility]) 2013   s/p radiation therapy March 2013, planned f/u with uro after XRT  . Radiation 09/18/2011 through 11/10/2011   Prostate 7600 cGy 40 sessions, seminal vesicles 5600 cGy 40 sessions    . Radiation 11/10/15-12/22/15   chest 60 Gy  . Shortness of breath dyspnea   . Smoker   . Thoracic aorta atherosclerosis ([place]) 10/08/2015   Mild by CT 09/2015     ALLERGIES:  is allergic to onion.  MEDICATIONS:  Current Outpatient Medications  Medication Sig Dispense Refill  . aspirin 81 MG tablet Take 81 mg by mouth daily. Reported on 12/21/2015    . budesonide-formoterol (SYMBICORT) 160-4.5 MCG/ACT inhaler Inhale 1 puff into the lungs 2 (two) times daily. 3 Inhaler 3  . cetirizine (ZYRTEC) 10 MG chewable tablet Chew 10 mg by mouth daily.    Marland Kitchen dexamethasone (DECADRON) 4 MG tablet 4 mg by mouth twice a day the day before, day of and after the chemotherapy every 3 weeks. 40 tablet 0  . folic acid (FOLVITE) 1 MG tablet Take 1 tablet (1 mg total) by mouth daily. 30 tablet 4  . guaiFENesin (MUCINEX) 600 MG 12 hr tablet  Take by mouth 2 (two) times daily.    Marland Kitchen ibuprofen (ADVIL,MOTRIN) 200 MG tablet Take 600 mg by mouth every 6 (six) hours as needed (Pain). Reported on 12/21/2015    . lidocaine-prilocaine (EMLA) cream Apply 1 application topically as needed. Squeeze a  small amount of cream to cotton ball and apply to port site 1-2 hours prior to chemotherapy. Do not rub in the cream and cover  with plastic wrap. 30 g 0  . losartan-hydrochlorothiazide (HYZAAR)  100-25 MG tablet Take 1 tablet daily by mouth. 90 tablet 3  . prochlorperazine (COMPAZINE) 10 MG tablet Take 1 tablet (10 mg total) by mouth every 6 (six) hours as needed for nausea or vomiting. 30 tablet 0   No current facility-administered medications for this visit.     SURGICAL HISTORY:  Past Surgical History:  Procedure Laterality Date  . COLONOSCOPY W/ BIOPSIES AND POLYPECTOMY  10/11/15   mult TAs, 3cm rectal polypoid lesion; rec further treatment, diverticulosis ([facility])  . ENDOBRONCHIAL ULTRASOUND Bilateral 10/18/2015   Procedure: ENDOBRONCHIAL ULTRASOUND;  Surgeon: Javier Glazier, MD;  Location: [facility];  Service: Cardiopulmonary;  Laterality: Bilateral;  . HERNIA REPAIR Right 2005   with mesh  . INSERTION PROSTATE RADIATION SEED  08/2011   prostate cancer, [facility]  . IR FLUORO GUIDE PORT INSERTION RIGHT  07/26/2017  . IR US GUIDE VASC ACCESS RIGHT  07/26/2017    REVIEW OF SYSTEMS:  A comprehensive review of systems was negative except for: Behavioral/Psych: positive for sleep disturbance   PHYSICAL EXAMINATION: General appearance: alert, cooperative and no distress Head: Normocephalic, without obvious abnormality, atraumatic Neck: no adenopathy, no JVD, supple, symmetrical, trachea midline and thyroid not enlarged, symmetric, no tenderness/mass/nodules Lymph nodes: Cervical, supraclavicular, and axillary nodes normal. Resp: clear to auscultation bilaterally Back: symmetric, no curvature. ROM normal. No CVA tenderness. Cardio: regular rate and rhythm, S1, S2 normal, no murmur, click, rub or gallop GI: soft, non-tender; bowel sounds normal; no masses,  no organomegaly Extremities: extremities normal, atraumatic, no cyanosis or edema  ECOG PERFORMANCE STATUS: 1 - Symptomatic but completely ambulatory  Blood pressure (!) 116/50, pulse 70, temperature 98.4 F (36.9 C), temperature source Oral, resp. rate 13, weight 209 lb 6.4 oz (95 kg), SpO2 100 %.  LABORATORY  DATA: Lab Results  Component Value Date   WBC 3.2 (L) 09/05/2017   HGB 11.2 (L) 08/15/2017   HCT 31.6 (L) 09/05/2017   MCV 106.0 (H) 09/05/2017   PLT 175 09/05/2017      Chemistry      Component Value Date/Time   NA 138 08/15/2017 1019   NA 138 07/18/2017 1000   K 3.9 08/15/2017 1019   K 4.0 07/18/2017 1000   CL 100 08/15/2017 1019   CO2 28 08/15/2017 1019   CO2 25 07/18/2017 1000   BUN 15 08/15/2017 1019   BUN 14.9 07/18/2017 1000   CREATININE 0.93 08/15/2017 1019   CREATININE 0.8 07/18/2017 1000      Component Value Date/Time   CALCIUM 9.7 08/15/2017 1019   CALCIUM 9.7 07/18/2017 1000   ALKPHOS 96 08/15/2017 1019   ALKPHOS 93 07/18/2017 1000   AST 23 08/15/2017 1019   AST 22 07/18/2017 1000   ALT 19 08/15/2017 1019   ALT 24 07/18/2017 1000   BILITOT 0.5 08/15/2017 1019   BILITOT 0.54 07/18/2017 1000       RADIOGRAPHIC STUDIES: No results found.  ASSESSMENT AND PLAN:  This is a very pleasant 67 years old white male with  recurrent non-small cell lung cancer initially diagnosed as stage IIIB non-small cell lung cancer favoring adenocarcinoma status post concurrent chemoradiation followed by consolidation chemotherapy. His last treatment was in September 2017. Restaging scan showed evidence for disease progression and the patient was a started on systemic chemotherapy with carboplatin, Alimta and Keytruda for 4 cycles.  His repeat imaging studies showed improvement of his disease.  He is currently undergoing maintenance treatment with Alimta and Keytruda status post 1 cycle. He is tolerating this treatment fairly well.  I recommended for the patient to proceed with cycle #2 of his maintenance treatment as scheduled. I will see him back for follow-up visit in 3 weeks for evaluation before starting cycle #3. For the insomnia, I strongly advised the patient again is sleeping and taking naps during the daytime.  He will continue with over-the-counter medication for now. The  patient was advised to call immediately if he has any concerning symptoms in the interval. The patient voices understanding of current disease status and treatment options and is in agreement with the current care plan. All questions were answered. The patient knows to call the clinic with any problems, questions or concerns. We can certainly see the patient much sooner if necessary.  Disclaimer: This note was dictated with voice recognition software. Similar sounding words can inadvertently be transcribed and may not be corrected upon review.

## 2017-09-05 NOTE — Patient Instructions (Signed)
Steps to Quit Smoking Smoking tobacco can be bad for your health. It can also affect almost every organ in your body. Smoking puts you and people around you at risk for many serious long-lasting (chronic) diseases. Quitting smoking is hard, but it is one of the best things that you can do for your health. It is never too late to quit. What are the benefits of quitting smoking? When you quit smoking, you lower your risk for getting serious diseases and conditions. They can include:  Lung cancer or lung disease.  Heart disease.  Stroke.  Heart attack.  Not being able to have children (infertility).  Weak bones (osteoporosis) and broken bones (fractures).  If you have coughing, wheezing, and shortness of breath, those symptoms may get better when you quit. You may also get sick less often. If you are pregnant, quitting smoking can help to lower your chances of having a baby of low birth weight. What can I do to help me quit smoking? Talk with your doctor about what can help you quit smoking. Some things you can do (strategies) include:  Quitting smoking totally, instead of slowly cutting back how much you smoke over a period of time.  Going to in-person counseling. You are more likely to quit if you go to many counseling sessions.  Using resources and support systems, such as: ? Online chats with a counselor. ? Phone quitlines. ? Printed self-help materials. ? Support groups or group counseling. ? Text messaging programs. ? Mobile phone apps or applications.  Taking medicines. Some of these medicines may have nicotine in them. If you are pregnant or breastfeeding, do not take any medicines to quit smoking unless your doctor says it is okay. Talk with your doctor about counseling or other things that can help you.  Talk with your doctor about using more than one strategy at the same time, such as taking medicines while you are also going to in-person counseling. This can help make  quitting easier. What things can I do to make it easier to quit? Quitting smoking might feel very hard at first, but there is a lot that you can do to make it easier. Take these steps:  Talk to your family and friends. Ask them to support and encourage you.  Call phone quitlines, reach out to support groups, or work with a counselor.  Ask people who smoke to not smoke around you.  Avoid places that make you want (trigger) to smoke, such as: ? Bars. ? Parties. ? Smoke-break areas at work.  Spend time with people who do not smoke.  Lower the stress in your life. Stress can make you want to smoke. Try these things to help your stress: ? Getting regular exercise. ? Deep-breathing exercises. ? Yoga. ? Meditating. ? Doing a body scan. To do this, close your eyes, focus on one area of your body at a time from head to toe, and notice which parts of your body are tense. Try to relax the muscles in those areas.  Download or buy apps on your mobile phone or tablet that can help you stick to your quit plan. There are many free apps, such as QuitGuide from the CDC (Centers for Disease Control and Prevention). You can find more support from smokefree.gov and other websites.  This information is not intended to replace advice given to you by your health care provider. Make sure you discuss any questions you have with your health care provider. Document Released: 04/29/2009 Document   Revised: 02/29/2016 Document Reviewed: 11/17/2014 Elsevier Interactive Patient Education  2018 [employer]  

## 2017-09-05 NOTE — Patient Instructions (Signed)
[facility] Discharge Instructions for Patients Receiving Chemotherapy  Today you received the following chemotherapy agents Alimta and Keytruda.   To help prevent nausea and vomiting after your treatment, we encourage you to take your nausea medication as directed.   If you develop nausea and vomiting that is not controlled by your nausea medication, call the clinic.   BELOW ARE SYMPTOMS THAT SHOULD BE REPORTED IMMEDIATELY:  *FEVER GREATER THAN 100.5 F  *CHILLS WITH OR WITHOUT FEVER  NAUSEA AND VOMITING THAT IS NOT CONTROLLED WITH YOUR NAUSEA MEDICATION  *UNUSUAL SHORTNESS OF BREATH  *UNUSUAL BRUISING OR BLEEDING  TENDERNESS IN MOUTH AND THROAT WITH OR WITHOUT PRESENCE OF ULCERS  *URINARY PROBLEMS  *BOWEL PROBLEMS  UNUSUAL RASH Items with * indicate a potential emergency and should be followed up as soon as possible.  Feel free to call the clinic should you have any questions or concerns. The clinic phone number is [phone].  Please show the [place] at check-in to the Emergency Department and triage nurse.

## 2017-09-12 ENCOUNTER — Other Ambulatory Visit: Payer: Medicare Other

## 2017-09-19 ENCOUNTER — Other Ambulatory Visit: Payer: Medicare Other

## 2017-09-24 ENCOUNTER — Encounter: Payer: Self-pay | Admitting: Pharmacist

## 2017-09-26 ENCOUNTER — Inpatient Hospital Stay: Payer: BLUE CROSS/BLUE SHIELD

## 2017-09-26 ENCOUNTER — Inpatient Hospital Stay: Payer: BLUE CROSS/BLUE SHIELD | Attending: Internal Medicine

## 2017-09-26 ENCOUNTER — Inpatient Hospital Stay (HOSPITAL_BASED_OUTPATIENT_CLINIC_OR_DEPARTMENT_OTHER): Payer: BLUE CROSS/BLUE SHIELD | Admitting: Oncology

## 2017-09-26 ENCOUNTER — Encounter: Payer: Self-pay | Admitting: Oncology

## 2017-09-26 ENCOUNTER — Telehealth: Payer: Self-pay | Admitting: Oncology

## 2017-09-26 VITALS — BP 105/48 | HR 64 | Temp 98.2°F | Resp 17 | Ht 71.0 in | Wt 208.0 lb

## 2017-09-26 DIAGNOSIS — Z79899 Other long term (current) drug therapy: Secondary | ICD-10-CM | POA: Diagnosis not present

## 2017-09-26 DIAGNOSIS — Z5111 Encounter for antineoplastic chemotherapy: Secondary | ICD-10-CM | POA: Diagnosis not present

## 2017-09-26 DIAGNOSIS — R5382 Chronic fatigue, unspecified: Secondary | ICD-10-CM

## 2017-09-26 DIAGNOSIS — C3432 Malignant neoplasm of lower lobe, left bronchus or lung: Secondary | ICD-10-CM | POA: Insufficient documentation

## 2017-09-26 DIAGNOSIS — G4700 Insomnia, unspecified: Secondary | ICD-10-CM

## 2017-09-26 DIAGNOSIS — Z5112 Encounter for antineoplastic immunotherapy: Secondary | ICD-10-CM

## 2017-09-26 DIAGNOSIS — Z7189 Other specified counseling: Secondary | ICD-10-CM

## 2017-09-26 LAB — CMP (CANCER CENTER ONLY)
ALT: 28 U/L (ref 0–55)
AST: 27 U/L (ref 5–34)
Albumin: 3.7 g/dL (ref 3.5–5.0)
Alkaline Phosphatase: 110 U/L (ref 40–150)
Anion gap: 9 (ref 3–11)
BUN: 22 mg/dL (ref 7–26)
CO2: 25 mmol/L (ref 22–29)
Calcium: 9.7 mg/dL (ref 8.4–10.4)
Chloride: 100 mmol/L (ref 98–109)
Creatinine: 0.86 mg/dL (ref 0.70–1.30)
GFR, Est AFR Am: >60 mL/min (ref >60)
GFR, Estimated: >60 mL/min (ref >60)
Glucose, Bld: 119 mg/dL (ref 70–140)
Potassium: 4.2 mmol/L (ref 3.5–5.1)
Sodium: 134 mmol/L — ABNORMAL LOW (ref 136–145)
Total Bilirubin: 0.6 mg/dL (ref 0.2–1.2)
Total Protein: 7.4 g/dL (ref 6.4–8.3)

## 2017-09-26 LAB — CBC WITH DIFFERENTIAL (CANCER CENTER ONLY)
Basophils Absolute: 0 10*3/uL (ref 0.0–0.1)
Basophils Relative: 0 %
Eosinophils Absolute: 0 10*3/uL (ref 0.0–0.5)
Eosinophils Relative: 0 %
HCT: 29 % — ABNORMAL LOW (ref 38.4–49.9)
Hemoglobin: 9.9 g/dL — ABNORMAL LOW (ref 13.0–17.1)
Lymphocytes Relative: 14 %
Lymphs Abs: 0.4 10*3/uL — ABNORMAL LOW (ref 0.9–3.3)
MCH: 35.9 pg — ABNORMAL HIGH (ref 27.2–33.4)
MCHC: 34.2 g/dL (ref 32.0–36.0)
MCV: 105 fL — ABNORMAL HIGH (ref 79.3–98.0)
Monocytes Absolute: 0.2 10*3/uL (ref 0.1–0.9)
Monocytes Relative: 7 %
Neutro Abs: 2.3 10*3/uL (ref 1.5–6.5)
Neutrophils Relative %: 79 %
Platelet Count: 227 10*3/uL (ref 140–400)
RBC: 2.76 MIL/uL — ABNORMAL LOW (ref 4.20–5.82)
RDW: 20.2 % — ABNORMAL HIGH (ref 11.0–14.6)
WBC Count: 2.9 10*3/uL — ABNORMAL LOW (ref 4.0–10.3)

## 2017-09-26 LAB — TSH: TSH: 0.201 u[IU]/mL — ABNORMAL LOW (ref 0.320–4.118)

## 2017-09-26 MED ORDER — SODIUM CHLORIDE 0.9% FLUSH
10.0000 mL | INTRAVENOUS | Status: DC | PRN
  Administered 2017-09-26: 10 mL
  Filled 2017-09-26: qty 10

## 2017-09-26 MED ORDER — DEXAMETHASONE 4 MG PO TABS: ORAL_TABLET | ORAL | 0 refills | Status: DC

## 2017-09-26 MED ORDER — HEPARIN SOD (PORK) LOCK FLUSH 100 UNIT/ML IV SOLN
500.0000 [IU] | Freq: Once | INTRAVENOUS | Status: AC | PRN
  Administered 2017-09-26: 500 [IU]
  Filled 2017-09-26: qty 5

## 2017-09-26 MED ORDER — PROCHLORPERAZINE MALEATE 10 MG PO TABS
10.0000 mg | ORAL_TABLET | Freq: Once | ORAL | Status: AC
  Administered 2017-09-26: 10 mg via ORAL

## 2017-09-26 MED ORDER — SODIUM CHLORIDE 0.9 % IJ SOLN
10.0000 mL | Freq: Once | INTRAMUSCULAR | Status: AC
  Administered 2017-09-26: 10 mL
  Filled 2017-09-26: qty 10

## 2017-09-26 MED ORDER — SODIUM CHLORIDE 0.9 % IV SOLN
200.0000 mg | Freq: Once | INTRAVENOUS | Status: AC
  Administered 2017-09-26: 200 mg via INTRAVENOUS
  Filled 2017-09-26: qty 8

## 2017-09-26 MED ORDER — PROCHLORPERAZINE MALEATE 10 MG PO TABS
ORAL_TABLET | ORAL | Status: AC
  Filled 2017-09-26: qty 1

## 2017-09-26 MED ORDER — SODIUM CHLORIDE 0.9 % IV SOLN
500.0000 mg/m2 | Freq: Once | INTRAVENOUS | Status: AC
  Administered 2017-09-26: 1100 mg via INTRAVENOUS
  Filled 2017-09-26: qty 40

## 2017-09-26 MED ORDER — CYANOCOBALAMIN 1000 MCG/ML IJ SOLN: 1000.0000 ug | Freq: Once | INTRAMUSCULAR | Status: DC

## 2017-09-26 MED ORDER — SODIUM CHLORIDE 0.9 % IV SOLN
Freq: Once | INTRAVENOUS | Status: AC
  Administered 2017-09-26: 13:00:00 via INTRAVENOUS

## 2017-09-26 NOTE — Patient Instructions (Signed)
[facility] Discharge Instructions for Patients Receiving Chemotherapy  Today you received the following chemotherapy agents:  Keytruda.  To help prevent nausea and vomiting after your treatment, we encourage you to take your nausea medication as directed.   If you develop nausea and vomiting that is not controlled by your nausea medication, call the clinic.   BELOW ARE SYMPTOMS THAT SHOULD BE REPORTED IMMEDIATELY:  *FEVER GREATER THAN 100.5 F  *CHILLS WITH OR WITHOUT FEVER  NAUSEA AND VOMITING THAT IS NOT CONTROLLED WITH YOUR NAUSEA MEDICATION  *UNUSUAL SHORTNESS OF BREATH  *UNUSUAL BRUISING OR BLEEDING  TENDERNESS IN MOUTH AND THROAT WITH OR WITHOUT PRESENCE OF ULCERS  *URINARY PROBLEMS  *BOWEL PROBLEMS  UNUSUAL RASH Items with * indicate a potential emergency and should be followed up as soon as possible.  Feel free to call the clinic should you have any questions or concerns. The clinic phone number is [phone].  Please show the CHEMO ALERT CARD at check-in to the Emergency Department and triage nurse.    

## 2017-09-26 NOTE — Assessment & Plan Note (Signed)
This is a very pleasant 67 year old white male with recurrent non-small cell lung cancer initially diagnosed as stage IIIB non-small cell lung cancer favoring adenocarcinoma status post concurrent chemoradiation followed by consolidation chemotherapy. His last treatment was in September 2017. Restaging scan showed evidence for disease progression and the patient was a started on systemic chemotherapy with carboplatin, Alimta and Keytruda for 4 cycles.  His repeat imaging studies showed improvement of his disease.  He is currently undergoing maintenance treatment with Alimta and Keytruda status post 2 cycles. He is tolerating this treatment fairly well.  I recommended for the patient to proceed with cycle #3 of his maintenance treatment as scheduled. The patient will have a restaging CT scan of the chest prior to his next cycle of treatment. He will follow-up in 3 weeks for evaluation prior to cycle #4 and to review his restaging CT scan results.  For the insomnia, I strongly advised the patient again is sleeping and taking naps during the daytime.  He will continue with over-the-counter medication for now.  The patient was advised to call immediately if he has any concerning symptoms in the interval. The patient voices understanding of current disease status and treatment options and is in agreement with the current care plan. All questions were answered. The patient knows to call the clinic with any problems, questions or concerns. We can certainly see the patient much sooner if necessary.

## 2017-09-26 NOTE — Progress Notes (Signed)
[facility] OFFICE PROGRESS NOTE  [name], MD [place] [address]  DIAGNOSIS: recurrent non-small cell lung cancer initially diagnosed as Stage IIIB (T1b, N3, M0) non-small cell lung cancer favoring adenocarcinoma presented with left lower lobe pulmonary nodule in addition to left hilar and bilateral mediastinal lymphadenopathy in addition to right supraclavicular lymph nodes diagnosed in April 2017.  PRIOR THERAPY: 1) Concurrent chemoradiation with weekly carboplatin for AUC of 2 and paclitaxel 45 mg/M2 started on 11/15/2015. He is status post 6 cycles. Last dose was getting 12/20/2015 with partial response. 2) Consolidation chemotherapy with carboplatin for AUC of 5 and paclitaxel 175 MG/M2 every 3 weeks with Neulasta support. Status post 3cycles. Last dose was given 04/03/2016. 3) systemic chemotherapy with carboplatin for AUC of 5, Alimta 500 MG/M2 and Ketruda 200 MG IV every 3 weeks. First dose 05/16/2017.    Status post 4 cycles.  CURRENT THERAPY:  Maintenance Alimta 500 mg/m with Keytruda 200 mg IV every 3 weeks.  First dose on 08/15/2017.  Status post 2 cycles.  INTERVAL HISTORY: Brian White 67 y.o. male returns for routine follow-up visit accompanied by his wife.  The patient is feeling fine today and has no specific complaints.  He denies fevers and chills.  Denies chest pain, shortness of breath, cough, hemoptysis.  Denies nausea, vomiting, constipation, diarrhea.  He denies any recent weight loss or night sweats.  The patient is here for evaluation prior to cycle #3 of his treatment.  MEDICAL HISTORY: Past Medical History:  Diagnosis Date  . Adenocarcinoma, lung ([place]) 10/08/2015   2.4cm spiculated LLL nodule suspicious for primary bronchogenic carcinoma by CT 09/2015 - adenocarcinoma by endobronchial biopsy  . Allergy   . Arthritis   . CAD (coronary artery disease)    mild MI; pt unaware - seen on stress test  . COPD (chronic  obstructive pulmonary disease) ([place]) 06/26/2011   Mild centrilobular and paraseptal emphysema by CT 09/2015   . ED (erectile dysfunction)   . H/O anaphylactic shock 2013   hives,hypotension,syncope  . H/O seasonal allergies   . Herpes zoster 12/06/2015  . Hypertension   . Polycythemia 01/25/2014  . Prostate cancer [facility]) 2013   s/p radiation therapy March 2013, planned f/u with uro after XRT  . Radiation 09/18/2011 through 11/10/2011   Prostate 7600 cGy 40 sessions, seminal vesicles 5600 cGy 40 sessions    . Radiation 11/10/15-12/22/15   chest 60 Gy  . Shortness of breath dyspnea   . Smoker   . Thoracic aorta atherosclerosis ([place]) 10/08/2015   Mild by CT 09/2015     ALLERGIES:  is allergic to onion.  MEDICATIONS:  Current Outpatient Medications  Medication Sig Dispense Refill  . aspirin 81 MG tablet Take 81 mg by mouth daily. Reported on 12/21/2015    . budesonide-formoterol (SYMBICORT) 160-4.5 MCG/ACT inhaler Inhale 1 puff into the lungs 2 (two) times daily. 3 Inhaler 3  . cetirizine (ZYRTEC) 10 MG chewable tablet Chew 10 mg by mouth daily.    Marland Kitchen dexamethasone (DECADRON) 4 MG tablet 4 mg by mouth twice a day the day before, day of and after the chemotherapy every 3 weeks. 40 tablet 0  . folic acid (FOLVITE) 1 MG tablet Take 1 tablet (1 mg total) by mouth daily. 30 tablet 4  . guaiFENesin (MUCINEX) 600 MG 12 hr tablet Take by mouth 2 (two) times daily.    Marland Kitchen ibuprofen (ADVIL,MOTRIN) 200 MG tablet Take 600 mg by mouth every  6 (six) hours as needed (Pain). Reported on 12/21/2015    . lidocaine-prilocaine (EMLA) cream Apply 1 application topically as needed. Squeeze a  small amount of cream to cotton ball and apply to port site 1-2 hours prior to chemotherapy. Do not rub in the cream and cover  with plastic wrap. 30 g 0  . losartan-hydrochlorothiazide (HYZAAR) 100-25 MG tablet Take 1 tablet daily by mouth. 90 tablet 3  . prochlorperazine (COMPAZINE) 10 MG tablet Take 1 tablet (10  mg total) by mouth every 6 (six) hours as needed for nausea or vomiting. 30 tablet 0   No current facility-administered medications for this visit.    Facility-Administered Medications Ordered in Other Visits  Medication Dose Route Frequency Provider Last Rate Last Dose  . cyanocobalamin ((VITAMIN B-12)) injection 1,000 mcg  1,000 mcg Intramuscular Once [name], MD      . sodium chloride flush (NS) 0.9 % injection 10 mL  10 mL Intracatheter PRN [name], MD   10 mL at 09/26/17 1416    SURGICAL HISTORY:  Past Surgical History:  Procedure Laterality Date  . COLONOSCOPY W/ BIOPSIES AND POLYPECTOMY  10/11/15   mult TAs, 3cm rectal polypoid lesion; rec further treatment, diverticulosis ([facility])  . ENDOBRONCHIAL ULTRASOUND Bilateral 10/18/2015   Procedure: ENDOBRONCHIAL ULTRASOUND;  Surgeon: Javier Glazier, MD;  Location: [facility];  Service: Cardiopulmonary;  Laterality: Bilateral;  . HERNIA REPAIR Right 2005   with mesh  . INSERTION PROSTATE RADIATION SEED  08/2011   prostate cancer, [facility]baum  . IR FLUORO GUIDE PORT INSERTION RIGHT  07/26/2017  . IR US GUIDE VASC ACCESS RIGHT  07/26/2017    REVIEW OF SYSTEMS:   Review of Systems  Constitutional: Negative for appetite change, chills, fatigue, fever and unexpected weight change.  HENT:   Negative for mouth sores, nosebleeds, sore throat and trouble swallowing.   Eyes: Negative for eye problems and icterus.  Respiratory: Negative for cough, hemoptysis, shortness of breath and wheezing.   Cardiovascular: Negative for chest pain and leg swelling.  Gastrointestinal: Negative for abdominal pain, constipation, diarrhea, nausea and vomiting.  Genitourinary: Negative for bladder incontinence, difficulty urinating, dysuria, frequency and hematuria.   Musculoskeletal: Negative for back pain, gait problem, neck pain and neck stiffness.  Skin: Negative for itching and rash.  Neurological: Negative for dizziness, extremity  weakness, gait problem, headaches, light-headedness and seizures.  Hematological: Negative for adenopathy. Does not bruise/bleed easily.  Psychiatric/Behavioral: Negative for confusion, depression and sleep disturbance. The patient is not nervous/anxious.     PHYSICAL EXAMINATION:  Blood pressure (!) 105/48, pulse 64, temperature 98.2 F (36.8 C), temperature source Oral, resp. rate 17, height 5\' 11"  (1.803 m), weight 208 lb (94.3 kg), SpO2 100 %.  ECOG PERFORMANCE STATUS: 1 - Symptomatic but completely ambulatory  Physical Exam  Constitutional: Oriented to person, place, and time and well-developed, well-nourished, and in no distress. No distress.  HENT:  Head: Normocephalic and atraumatic.  Mouth/Throat: Oropharynx is clear and moist. No oropharyngeal exudate.  Eyes: Conjunctivae are normal. Right eye exhibits no discharge. Left eye exhibits no discharge. No scleral icterus.  Neck: Normal range of motion. Neck supple.  Cardiovascular: Normal rate, regular rhythm, normal heart sounds and intact distal pulses.   Pulmonary/Chest: Effort normal and breath sounds normal. No respiratory distress. No wheezes. No rales.  Abdominal: Soft. Bowel sounds are normal. Exhibits no distension and no mass. There is no tenderness.  Musculoskeletal: Normal range of motion. Exhibits no edema.  Lymphadenopathy:  No cervical adenopathy.  Neurological: Alert and oriented to person, place, and time. Exhibits normal muscle tone. Gait normal. Coordination normal.  Skin: Skin is warm and dry. No rash noted. Not diaphoretic. No erythema. No pallor.  Psychiatric: Mood, memory and judgment normal.  Vitals reviewed.  LABORATORY DATA: Lab Results  Component Value Date   WBC 2.9 (L) 09/26/2017   HGB 11.2 (L) 08/15/2017   HCT 29.0 (L) 09/26/2017   MCV 105.0 (H) 09/26/2017   PLT 227 09/26/2017      Chemistry      Component Value Date/Time   NA 134 (L) 09/26/2017 1046   NA 138 07/18/2017 1000   K 4.2  09/26/2017 1046   K 4.0 07/18/2017 1000   CL 100 09/26/2017 1046   CO2 25 09/26/2017 1046   CO2 25 07/18/2017 1000   BUN 22 09/26/2017 1046   BUN 14.9 07/18/2017 1000   CREATININE 0.86 09/26/2017 1046   CREATININE 0.8 07/18/2017 1000      Component Value Date/Time   CALCIUM 9.7 09/26/2017 1046   CALCIUM 9.7 07/18/2017 1000   ALKPHOS 110 09/26/2017 1046   ALKPHOS 93 07/18/2017 1000   AST 27 09/26/2017 1046   AST 22 07/18/2017 1000   ALT 28 09/26/2017 1046   ALT 24 07/18/2017 1000   BILITOT 0.6 09/26/2017 1046   BILITOT 0.54 07/18/2017 1000       RADIOGRAPHIC STUDIES:  No results found.   ASSESSMENT/PLAN:  Cancer of lower lobe of left lung [facility]) This is a very pleasant 67 year old white male with recurrent non-small cell lung cancer initially diagnosed as stage IIIB non-small cell lung cancer favoring adenocarcinoma status post concurrent chemoradiation followed by consolidation chemotherapy. His last treatment was in September 2017. Restaging scan showed evidence for disease progression and the patient was a started on systemic chemotherapy with carboplatin, Alimta and Keytruda for 4 cycles.  His repeat imaging studies showed improvement of his disease.  He is currently undergoing maintenance treatment with Alimta and Keytruda status post 2 cycles. He is tolerating this treatment fairly well.  I recommended for the patient to proceed with cycle #3 of his maintenance treatment as scheduled. The patient will have a restaging CT scan of the chest prior to his next cycle of treatment. He will follow-up in 3 weeks for evaluation prior to cycle #4 and to review his restaging CT scan results.  For the insomnia, I strongly advised the patient again is sleeping and taking naps during the daytime.  He will continue with over-the-counter medication for now.  The patient was advised to call immediately if he has any concerning symptoms in the interval. The patient voices understanding of  current disease status and treatment options and is in agreement with the current care plan. All questions were answered. The patient knows to call the clinic with any problems, questions or concerns. We can certainly see the patient much sooner if necessary.  Orders Placed This Encounter  Procedures  . CT CHEST W CONTRAST    Standing Status:   Future    Standing Expiration Date:   09/27/2018    Order Specific Question:   If indicated for the ordered procedure, I authorize the administration of contrast media per Radiology protocol    Answer:   Yes    Order Specific Question:   Preferred imaging location?    Answer:   [facility]    Order Specific Question:   Radiology Contrast Protocol - do NOT remove  file path    Answer:   [email or web address]    Order Specific Question:   Reason for Exam additional comments    Answer:   Lung cancer. Restaging.    Mikey Bussing, DNP, AGPCNP-BC, AOCNP 09/26/17

## 2017-09-26 NOTE — Telephone Encounter (Signed)
Already 3 cycles per 3/13 los  Scheduled - no additional appts to add.

## 2017-09-30 ENCOUNTER — Other Ambulatory Visit: Payer: Self-pay | Admitting: Internal Medicine

## 2017-09-30 DIAGNOSIS — Z23 Encounter for immunization: Secondary | ICD-10-CM

## 2017-09-30 DIAGNOSIS — C3432 Malignant neoplasm of lower lobe, left bronchus or lung: Secondary | ICD-10-CM

## 2017-09-30 DIAGNOSIS — R5382 Chronic fatigue, unspecified: Secondary | ICD-10-CM

## 2017-09-30 DIAGNOSIS — Z7189 Other specified counseling: Secondary | ICD-10-CM

## 2017-09-30 DIAGNOSIS — Z5112 Encounter for antineoplastic immunotherapy: Secondary | ICD-10-CM

## 2017-10-16 ENCOUNTER — Ambulatory Visit (HOSPITAL_COMMUNITY)
Admission: RE | Admit: 2017-10-16 | Discharge: 2017-10-16 | Disposition: A | Discharge status: Home / Self Care | Payer: BLUE CROSS/BLUE SHIELD | Source: Ambulatory Visit | Attending: Oncology | Admitting: Oncology

## 2017-10-16 ENCOUNTER — Encounter (HOSPITAL_COMMUNITY): Payer: Self-pay

## 2017-10-16 DIAGNOSIS — K8020 Calculus of gallbladder without cholecystitis without obstruction: Secondary | ICD-10-CM | POA: Diagnosis not present

## 2017-10-16 DIAGNOSIS — C3432 Malignant neoplasm of lower lobe, left bronchus or lung: Secondary | ICD-10-CM

## 2017-10-16 DIAGNOSIS — I2510 Atherosclerotic heart disease of native coronary artery without angina pectoris: Secondary | ICD-10-CM | POA: Diagnosis not present

## 2017-10-16 DIAGNOSIS — I700 Atherosclerosis of aorta: Secondary | ICD-10-CM | POA: Insufficient documentation

## 2017-10-16 MED ORDER — IOPAMIDOL (ISOVUE-300) INJECTION 61%
INTRAVENOUS | Status: AC
  Filled 2017-10-16: qty 75

## 2017-10-16 MED ORDER — IOPAMIDOL (ISOVUE-300) INJECTION 61%
75.0000 mL | Freq: Once | INTRAVENOUS | Status: AC | PRN
  Administered 2017-10-16: 75 mL via INTRAVENOUS

## 2017-10-17 ENCOUNTER — Encounter: Payer: Self-pay | Admitting: Internal Medicine

## 2017-10-17 ENCOUNTER — Inpatient Hospital Stay: Payer: BLUE CROSS/BLUE SHIELD

## 2017-10-17 ENCOUNTER — Inpatient Hospital Stay (HOSPITAL_BASED_OUTPATIENT_CLINIC_OR_DEPARTMENT_OTHER): Payer: BLUE CROSS/BLUE SHIELD | Admitting: Internal Medicine

## 2017-10-17 ENCOUNTER — Inpatient Hospital Stay: Payer: BLUE CROSS/BLUE SHIELD | Attending: Internal Medicine

## 2017-10-17 ENCOUNTER — Telehealth: Payer: Self-pay | Admitting: Internal Medicine

## 2017-10-17 VITALS — BP 123/66 | HR 61 | Temp 98.1°F | Resp 18 | Ht 71.0 in | Wt 213.9 lb

## 2017-10-17 DIAGNOSIS — Z5111 Encounter for antineoplastic chemotherapy: Secondary | ICD-10-CM

## 2017-10-17 DIAGNOSIS — Z5112 Encounter for antineoplastic immunotherapy: Secondary | ICD-10-CM

## 2017-10-17 DIAGNOSIS — Z79899 Other long term (current) drug therapy: Secondary | ICD-10-CM | POA: Insufficient documentation

## 2017-10-17 DIAGNOSIS — I10 Essential (primary) hypertension: Secondary | ICD-10-CM

## 2017-10-17 DIAGNOSIS — H04219 Epiphora due to excess lacrimation, unspecified lacrimal gland: Secondary | ICD-10-CM

## 2017-10-17 DIAGNOSIS — C3432 Malignant neoplasm of lower lobe, left bronchus or lung: Secondary | ICD-10-CM

## 2017-10-17 DIAGNOSIS — H04203 Unspecified epiphora, bilateral lacrimal glands: Secondary | ICD-10-CM

## 2017-10-17 DIAGNOSIS — R5382 Chronic fatigue, unspecified: Secondary | ICD-10-CM

## 2017-10-17 LAB — CMP (CANCER CENTER ONLY)
ALT: 23 U/L (ref 0–55)
AST: 24 U/L (ref 5–34)
Albumin: 3.7 g/dL (ref 3.5–5.0)
Alkaline Phosphatase: 101 U/L (ref 40–150)
Anion gap: 7 (ref 3–11)
BUN: 17 mg/dL (ref 7–26)
CO2: 27 mmol/L (ref 22–29)
Calcium: 9.8 mg/dL (ref 8.4–10.4)
Chloride: 103 mmol/L (ref 98–109)
Creatinine: 0.84 mg/dL (ref 0.70–1.30)
GFR, Est AFR Am: >60 mL/min (ref >60)
GFR, Estimated: >60 mL/min (ref >60)
Glucose, Bld: 124 mg/dL (ref 70–140)
Potassium: 4.2 mmol/L (ref 3.5–5.1)
Sodium: 137 mmol/L (ref 136–145)
Total Bilirubin: 0.3 mg/dL (ref 0.2–1.2)
Total Protein: 6.9 g/dL (ref 6.4–8.3)

## 2017-10-17 LAB — CBC WITH DIFFERENTIAL (CANCER CENTER ONLY)
Basophils Absolute: 0 10*3/uL (ref 0.0–0.1)
Basophils Relative: 0 %
Eosinophils Absolute: 0 10*3/uL (ref 0.0–0.5)
Eosinophils Relative: 0 %
HCT: 30 % — ABNORMAL LOW (ref 38.4–49.9)
Hemoglobin: 9.7 g/dL — ABNORMAL LOW (ref 13.0–17.1)
Lymphocytes Relative: 12 %
Lymphs Abs: 0.5 10*3/uL — ABNORMAL LOW (ref 0.9–3.3)
MCH: 34.9 pg — ABNORMAL HIGH (ref 27.2–33.4)
MCHC: 32.3 g/dL (ref 32.0–36.0)
MCV: 107.9 fL — ABNORMAL HIGH (ref 79.3–98.0)
Monocytes Absolute: 0.5 10*3/uL (ref 0.1–0.9)
Monocytes Relative: 12 %
Neutro Abs: 3.2 10*3/uL (ref 1.5–6.5)
Neutrophils Relative %: 76 %
Platelet Count: 229 10*3/uL (ref 140–400)
RBC: 2.78 MIL/uL — ABNORMAL LOW (ref 4.20–5.82)
RDW: 17.8 % — ABNORMAL HIGH (ref 11.0–14.6)
WBC Count: 4.2 10*3/uL (ref 4.0–10.3)

## 2017-10-17 LAB — TSH: TSH: 0.173 u[IU]/mL — ABNORMAL LOW (ref 0.320–4.118)

## 2017-10-17 MED ORDER — PEMETREXED DISODIUM CHEMO INJECTION 500 MG
500.0000 mg/m2 | Freq: Once | INTRAVENOUS | Status: AC
  Administered 2017-10-17: 1100 mg via INTRAVENOUS
  Filled 2017-10-17: qty 4

## 2017-10-17 MED ORDER — HEPARIN SOD (PORK) LOCK FLUSH 100 UNIT/ML IV SOLN
500.0000 [IU] | Freq: Once | INTRAVENOUS | Status: AC | PRN
  Administered 2017-10-17: 500 [IU]
  Filled 2017-10-17: qty 5

## 2017-10-17 MED ORDER — PROCHLORPERAZINE MALEATE 10 MG PO TABS
ORAL_TABLET | ORAL | Status: AC
  Filled 2017-10-17: qty 1

## 2017-10-17 MED ORDER — SODIUM CHLORIDE 0.9 % IV SOLN
Freq: Once | INTRAVENOUS | Status: AC
  Administered 2017-10-17: 12:00:00 via INTRAVENOUS

## 2017-10-17 MED ORDER — CYANOCOBALAMIN 1000 MCG/ML IJ SOLN
1000.0000 ug | Freq: Once | INTRAMUSCULAR | Status: AC
  Administered 2017-10-17: 1000 ug via INTRAMUSCULAR

## 2017-10-17 MED ORDER — CYANOCOBALAMIN 1000 MCG/ML IJ SOLN
INTRAMUSCULAR | Status: AC
  Filled 2017-10-17: qty 1

## 2017-10-17 MED ORDER — PROCHLORPERAZINE MALEATE 10 MG PO TABS
10.0000 mg | ORAL_TABLET | Freq: Once | ORAL | Status: AC
  Administered 2017-10-17: 10 mg via ORAL

## 2017-10-17 MED ORDER — SODIUM CHLORIDE 0.9% FLUSH
10.0000 mL | INTRAVENOUS | Status: DC | PRN
  Administered 2017-10-17: 10 mL via INTRAVENOUS
  Filled 2017-10-17: qty 10

## 2017-10-17 MED ORDER — SODIUM CHLORIDE 0.9 % IV SOLN
200.0000 mg | Freq: Once | INTRAVENOUS | Status: AC
  Administered 2017-10-17: 200 mg via INTRAVENOUS
  Filled 2017-10-17: qty 8

## 2017-10-17 MED ORDER — SODIUM CHLORIDE 0.9% FLUSH
10.0000 mL | INTRAVENOUS | Status: DC | PRN
  Administered 2017-10-17: 10 mL
  Filled 2017-10-17: qty 10

## 2017-10-17 NOTE — Patient Instructions (Signed)
Steps to Quit Smoking Smoking tobacco can be bad for your health. It can also affect almost every organ in your body. Smoking puts you and people around you at risk for many serious long-lasting (chronic) diseases. Quitting smoking is hard, but it is one of the best things that you can do for your health. It is never too late to quit. What are the benefits of quitting smoking? When you quit smoking, you lower your risk for getting serious diseases and conditions. They can include:  Lung cancer or lung disease.  Heart disease.  Stroke.  Heart attack.  Not being able to have children (infertility).  Weak bones (osteoporosis) and broken bones (fractures).  If you have coughing, wheezing, and shortness of breath, those symptoms may get better when you quit. You may also get sick less often. If you are pregnant, quitting smoking can help to lower your chances of having a baby of low birth weight. What can I do to help me quit smoking? Talk with your doctor about what can help you quit smoking. Some things you can do (strategies) include:  Quitting smoking totally, instead of slowly cutting back how much you smoke over a period of time.  Going to in-person counseling. You are more likely to quit if you go to many counseling sessions.  Using resources and support systems, such as: ? Online chats with a counselor. ? Phone quitlines. ? Printed self-help materials. ? Support groups or group counseling. ? Text messaging programs. ? Mobile phone apps or applications.  Taking medicines. Some of these medicines may have nicotine in them. If you are pregnant or breastfeeding, do not take any medicines to quit smoking unless your doctor says it is okay. Talk with your doctor about counseling or other things that can help you.  Talk with your doctor about using more than one strategy at the same time, such as taking medicines while you are also going to in-person counseling. This can help make  quitting easier. What things can I do to make it easier to quit? Quitting smoking might feel very hard at first, but there is a lot that you can do to make it easier. Take these steps:  Talk to your family and friends. Ask them to support and encourage you.  Call phone quitlines, reach out to support groups, or work with a counselor.  Ask people who smoke to not smoke around you.  Avoid places that make you want (trigger) to smoke, such as: ? Bars. ? Parties. ? Smoke-break areas at work.  Spend time with people who do not smoke.  Lower the stress in your life. Stress can make you want to smoke. Try these things to help your stress: ? Getting regular exercise. ? Deep-breathing exercises. ? Yoga. ? Meditating. ? Doing a body scan. To do this, close your eyes, focus on one area of your body at a time from head to toe, and notice which parts of your body are tense. Try to relax the muscles in those areas.  Download or buy apps on your mobile phone or tablet that can help you stick to your quit plan. There are many free apps, such as QuitGuide from the CDC (Centers for Disease Control and Prevention). You can find more support from smokefree.gov and other websites.  This information is not intended to replace advice given to you by your health care provider. Make sure you discuss any questions you have with your health care provider. Document Released: 04/29/2009 Document   Revised: 02/29/2016 Document Reviewed: 11/17/2014 Elsevier Interactive Patient Education  2018 [employer]  

## 2017-10-17 NOTE — Telephone Encounter (Signed)
Scheduled appt per 4/3 los - patient to get an updated schedule next visit.

## 2017-10-17 NOTE — Patient Instructions (Signed)
[place] Discharge Instructions for Patients Receiving Chemotherapy  Today you received the following chemotherapy agents: Pembrolizumab (Keytruda) and Pemetrexed (Alimta).  To help prevent nausea and vomiting after your treatment, we encourage you to take your nausea medication as prescribed.  If you develop nausea and vomiting that is not controlled by your nausea medication, call the clinic.   BELOW ARE SYMPTOMS THAT SHOULD BE REPORTED IMMEDIATELY:  *FEVER GREATER THAN 100.5 F  *CHILLS WITH OR WITHOUT FEVER  NAUSEA AND VOMITING THAT IS NOT CONTROLLED WITH YOUR NAUSEA MEDICATION  *UNUSUAL SHORTNESS OF BREATH  *UNUSUAL BRUISING OR BLEEDING  TENDERNESS IN MOUTH AND THROAT WITH OR WITHOUT PRESENCE OF ULCERS  *URINARY PROBLEMS  *BOWEL PROBLEMS  UNUSUAL RASH Items with * indicate a potential emergency and should be followed up as soon as possible.  Feel free to call the clinic should you have any questions or concerns. The clinic phone number is [phone].  Please show the [place] at check-in to the Emergency Department and triage nurse.

## 2017-10-17 NOTE — Progress Notes (Signed)
[facility] [phone]   [phone]  OFFICE PROGRESS NOTE  [name], MD [place] [address]  DIAGNOSIS: recurrent non-small cell lung cancer initially diagnosed as Stage IIIB (T1b, N3, M0) non-small cell lung cancer favoring adenocarcinoma presented with left lower lobe pulmonary nodule in addition to left hilar and bilateral mediastinal lymphadenopathy in addition to right supraclavicular lymph nodes diagnosed in April 2017.  PRIOR THERAPY: 1) Concurrent chemoradiation with weekly carboplatin for AUC of 2 and paclitaxel 45 mg/M2 started on 11/15/2015. He is status post 6 cycles. Last dose was getting 12/20/2015 with partial response. 2) Consolidation chemotherapy with carboplatin for AUC of 5 and paclitaxel 175 MG/M2 every 3 weeks with Neulasta support. Status post 3 cycles. Last dose was given 04/03/2016.  CURRENT THERAPY: systemic chemotherapy with carboplatin for AUC of 5, Alimta 500 MG/M2 and Ketruda 200 MG IV every 3 weeks. First dose 05/16/2017.  Status post 7 cycle.  Starting from cycle #5 the patient is treated with maintenance Alimta and Keytruda only.  INTERVAL HISTORY: Brian White 67 y.o. male returns to the clinic today for follow-up visit accompanied by his wife.  The patient has no complaints today except for more tears in his eyes.  This is most likely secondary to his treatment with Alimta.  He denied having any chest pain, shortness of breath, cough or hemoptysis.  He denied having any fever or chills.  He has no nausea, vomiting, diarrhea or constipation.  The patient denied having any significant weight loss or night sweats.  He has no headache or visual changes.  He had repeat CT scan of the chest performed recently and he is here for evaluation and discussion of his discuss results.  MEDICAL HISTORY: Past Medical History:  Diagnosis Date  . Adenocarcinoma, lung ([facility]) 10/08/2015   2.4cm spiculated  LLL nodule suspicious for primary bronchogenic carcinoma by CT 09/2015 - adenocarcinoma by endobronchial biopsy  . Allergy   . Arthritis   . CAD (coronary artery disease)    mild MI; pt unaware - seen on stress test  . COPD (chronic obstructive pulmonary disease) ([facility]) 06/26/2011   Mild centrilobular and paraseptal emphysema by CT 09/2015   . ED (erectile dysfunction)   . H/O anaphylactic shock 2013   hives,hypotension,syncope  . H/O seasonal allergies   . Herpes zoster 12/06/2015  . Hypertension   . Polycythemia 01/25/2014  . Prostate cancer [facility]) 2013   s/p radiation therapy March 2013, planned f/u with uro after XRT  . Radiation 09/18/2011 through 11/10/2011   Prostate 7600 cGy 40 sessions, seminal vesicles 5600 cGy 40 sessions    . Radiation 11/10/15-12/22/15   chest 60 Gy  . Shortness of breath dyspnea   . Smoker   . Thoracic aorta atherosclerosis ([facility]) 10/08/2015   Mild by CT 09/2015     ALLERGIES:  is allergic to onion.  MEDICATIONS:  Current Outpatient Medications  Medication Sig Dispense Refill  . aspirin 81 MG tablet Take 81 mg by mouth daily. Reported on 12/21/2015    . budesonide-formoterol (SYMBICORT) 160-4.5 MCG/ACT inhaler Inhale 1 puff into the lungs 2 (two) times daily. 3 Inhaler 3  . cetirizine (ZYRTEC) 10 MG chewable tablet Chew 10 mg by mouth daily.    Marland Kitchen dexamethasone (DECADRON) 4 MG tablet 4 mg by mouth twice a day the day before, day of and after the chemotherapy every 3 weeks. 40 tablet 0  . folic acid (FOLVITE) 1  MG tablet TAKE 1 TABLET(1 MG) BY MOUTH DAILY 30 tablet 0  . guaiFENesin (MUCINEX) 600 MG 12 hr tablet Take by mouth 2 (two) times daily.    Marland Kitchen ibuprofen (ADVIL,MOTRIN) 200 MG tablet Take 600 mg by mouth every 6 (six) hours as needed (Pain). Reported on 12/21/2015    . lidocaine-prilocaine (EMLA) cream Apply 1 application topically as needed. Squeeze a  small amount of cream to cotton ball and apply to port site 1-2 hours prior to  chemotherapy. Do not rub in the cream and cover  with plastic wrap. 30 g 0  . losartan-hydrochlorothiazide (HYZAAR) 100-25 MG tablet Take 1 tablet daily by mouth. 90 tablet 3  . prochlorperazine (COMPAZINE) 10 MG tablet Take 1 tablet (10 mg total) by mouth every 6 (six) hours as needed for nausea or vomiting. 30 tablet 0   No current facility-administered medications for this visit.     SURGICAL HISTORY:  Past Surgical History:  Procedure Laterality Date  . COLONOSCOPY W/ BIOPSIES AND POLYPECTOMY  10/11/15   mult TAs, 3cm rectal polypoid lesion; rec further treatment, diverticulosis ([facility])  . ENDOBRONCHIAL ULTRASOUND Bilateral 10/18/2015   Procedure: ENDOBRONCHIAL ULTRASOUND;  Surgeon: Javier Glazier, MD;  Location: [facility] ENDOSCOPY;  Service: Cardiopulmonary;  Laterality: Bilateral;  . HERNIA REPAIR Right 2005   with mesh  . INSERTION PROSTATE RADIATION SEED  08/2011   prostate cancer, [facility]  . IR FLUORO GUIDE PORT INSERTION RIGHT  07/26/2017  . IR US GUIDE VASC ACCESS RIGHT  07/26/2017    REVIEW OF SYSTEMS:  Constitutional: negative Eyes: positive for Increased lacrimation Ears, nose, mouth, throat, and face: negative Respiratory: negative Cardiovascular: negative Gastrointestinal: negative Genitourinary:negative Integument/breast: negative Hematologic/lymphatic: negative Musculoskeletal:negative Neurological: negative Behavioral/Psych: negative Endocrine: negative Allergic/Immunologic: negative   PHYSICAL EXAMINATION: General appearance: alert, cooperative and no distress Head: Normocephalic, without obvious abnormality, atraumatic Neck: no adenopathy, no JVD, supple, symmetrical, trachea midline and thyroid not enlarged, symmetric, no tenderness/mass/nodules Lymph nodes: Cervical, supraclavicular, and axillary nodes normal. Resp: clear to auscultation bilaterally Back: symmetric, no curvature. ROM normal. No CVA tenderness. Cardio: regular rate and rhythm, S1, S2  normal, no murmur, click, rub or gallop GI: soft, non-tender; bowel sounds normal; no masses,  no organomegaly Extremities: extremities normal, atraumatic, no cyanosis or edema Neurologic: Alert and oriented X 3, normal strength and tone. Normal symmetric reflexes. Normal coordination and gait  ECOG PERFORMANCE STATUS: 1 - Symptomatic but completely ambulatory  Blood pressure 123/66, pulse 61, temperature 98.1 F (36.7 C), temperature source Oral, resp. rate 18, height 5\' 11"  (1.803 m), weight 213 lb 14.4 oz (97 kg), SpO2 99 %.  LABORATORY DATA: Lab Results  Component Value Date   WBC 4.2 10/17/2017   HGB 11.2 (L) 08/15/2017   HCT 30.0 (L) 10/17/2017   MCV 107.9 (H) 10/17/2017   PLT 229 10/17/2017      Chemistry      Component Value Date/Time   NA 134 (L) 09/26/2017 1046   NA 138 07/18/2017 1000   K 4.2 09/26/2017 1046   K 4.0 07/18/2017 1000   CL 100 09/26/2017 1046   CO2 25 09/26/2017 1046   CO2 25 07/18/2017 1000   BUN 22 09/26/2017 1046   BUN 14.9 07/18/2017 1000   CREATININE 0.86 09/26/2017 1046   CREATININE 0.8 07/18/2017 1000      Component Value Date/Time   CALCIUM 9.7 09/26/2017 1046   CALCIUM 9.7 07/18/2017 1000   ALKPHOS 110 09/26/2017 1046   ALKPHOS 93 07/18/2017 1000  AST 27 09/26/2017 1046   AST 22 07/18/2017 1000   ALT 28 09/26/2017 1046   ALT 24 07/18/2017 1000   BILITOT 0.6 09/26/2017 1046   BILITOT 0.54 07/18/2017 1000       RADIOGRAPHIC STUDIES: Ct Chest W Contrast  Result Date: 10/17/2017 CLINICAL DATA:  Left lung cancer, chemotherapy in progress. Radiation therapy finished in 2017. Prostate cancer. EXAM: CT CHEST WITH CONTRAST TECHNIQUE: Multidetector CT imaging of the chest was performed during intravenous contrast administration. CONTRAST:  61mL ISOVUE-300 IOPAMIDOL (ISOVUE-300) INJECTION 61% COMPARISON:  07/16/2017 and PET 03/08/2017. FINDINGS: Cardiovascular: Right IJ Port-A-Cath terminates in the SVC. Atherosclerotic calcification of the  arterial vasculature, including coronary arteries. Heart is enlarged. Left ventricle is dilated. No pericardial effusion. Mediastinum/Nodes: Mediastinal lymph nodes measure up to 10 mm in the low right paratracheal station, as before. No hilar or axillary adenopathy. Esophagus is grossly unremarkable. Lungs/Pleura: Subpleural scarring in the medial upper right hemithorax. Infrahilar superior segment left lower lobe nodule is best measured on soft tissue windows, measuring 1.7 x 2.2 cm, stable. Post treatment scarring in the left lower lobe. No pleural fluid. Airway is unremarkable. Upper Abdomen: Visualized portion of the liver is unremarkable. Noncalcified stones in the gallbladder. Visualized portions of the adrenal glands are unremarkable. Subcentimeter low-attenuation lesions in the kidneys are too small to characterize. Visualized portions of spleen, pancreas, stomach and bowel are grossly unremarkable. Gastrohepatic ligament lymph nodes measure up to 12 mm as before. Musculoskeletal: Degenerative changes in the spine. No worrisome lytic or sclerotic lesions. IMPRESSION: 1. Stable infrahilar left lower lobe nodule and post treatment scarring. 2. Borderline enlarged gastrohepatic lymph nodes, stable. 3. Aortic atherosclerosis (ICD10-170.0). Coronary artery calcification. 4. Cholelithiasis. Electronically Signed   By: Lorin Picket M.D.   On: 10/17/2017 08:51    ASSESSMENT AND PLAN:  This is a very pleasant 67 years old white male with recurrent non-small cell lung cancer initially diagnosed as stage IIIB non-small cell lung cancer favoring adenocarcinoma status post concurrent chemoradiation followed by consolidation chemotherapy. His last treatment was in September 2017. Restaging scan showed evidence for disease progression and the patient was a started on systemic chemotherapy with carboplatin, Alimta and Keytruda for 4 cycles.  His repeat imaging studies showed improvement of his disease.  He is  currently undergoing maintenance treatment with Alimta and Keytruda status post 3 cycles. The patient continues to tolerate this treatment fairly well with no concerning complaints except for lacrimation. He had repeat CT scan of the chest performed recently.  I personally and independently reviewed the scans and discussed the results with the patient and his wife.  His a scan showed no concerning findings for disease progression. I recommended for the patient to continue his maintenance treatment with Alimta and Keytruda. He will proceed with cycle #4 today. I will see him back for follow-up visit in 3 weeks for evaluation before starting cycle #5. For the increased lacrimation, this is most likely secondary to treatment with Alimta.  I recommended for the patient to use over-the-counter saline eyedrops. He was advised to call immediately if he has any concerning symptoms in the interval. The patient voices understanding of current disease status and treatment options and is in agreement with the current care plan. All questions were answered. The patient knows to call the clinic with any problems, questions or concerns. We can certainly see the patient much sooner if necessary.  Disclaimer: This note was dictated with voice recognition software. Similar sounding words can inadvertently be transcribed  and may not be corrected upon review.

## 2017-11-05 ENCOUNTER — Other Ambulatory Visit: Payer: Self-pay | Admitting: Internal Medicine

## 2017-11-05 DIAGNOSIS — Z5112 Encounter for antineoplastic immunotherapy: Secondary | ICD-10-CM

## 2017-11-05 DIAGNOSIS — Z7189 Other specified counseling: Secondary | ICD-10-CM

## 2017-11-05 DIAGNOSIS — R5382 Chronic fatigue, unspecified: Secondary | ICD-10-CM

## 2017-11-05 DIAGNOSIS — C3432 Malignant neoplasm of lower lobe, left bronchus or lung: Secondary | ICD-10-CM

## 2017-11-05 DIAGNOSIS — Z23 Encounter for immunization: Secondary | ICD-10-CM

## 2017-11-07 ENCOUNTER — Telehealth: Payer: Self-pay | Admitting: Oncology

## 2017-11-07 ENCOUNTER — Inpatient Hospital Stay: Payer: BLUE CROSS/BLUE SHIELD

## 2017-11-07 ENCOUNTER — Inpatient Hospital Stay (HOSPITAL_BASED_OUTPATIENT_CLINIC_OR_DEPARTMENT_OTHER): Payer: BLUE CROSS/BLUE SHIELD | Admitting: Oncology

## 2017-11-07 ENCOUNTER — Encounter: Payer: Self-pay | Admitting: Oncology

## 2017-11-07 ENCOUNTER — Inpatient Hospital Stay: Payer: BLUE CROSS/BLUE SHIELD | Admitting: Oncology

## 2017-11-07 VITALS — BP 131/66 | HR 56 | Temp 97.9°F | Resp 18 | Ht 71.0 in | Wt 209.3 lb

## 2017-11-07 DIAGNOSIS — Z5112 Encounter for antineoplastic immunotherapy: Secondary | ICD-10-CM

## 2017-11-07 DIAGNOSIS — Z79899 Other long term (current) drug therapy: Secondary | ICD-10-CM | POA: Diagnosis not present

## 2017-11-07 DIAGNOSIS — H04209 Unspecified epiphora, unspecified lacrimal gland: Secondary | ICD-10-CM | POA: Insufficient documentation

## 2017-11-07 DIAGNOSIS — H04219 Epiphora due to excess lacrimation, unspecified lacrimal gland: Secondary | ICD-10-CM | POA: Diagnosis not present

## 2017-11-07 DIAGNOSIS — Z5111 Encounter for antineoplastic chemotherapy: Secondary | ICD-10-CM | POA: Diagnosis not present

## 2017-11-07 DIAGNOSIS — C3432 Malignant neoplasm of lower lobe, left bronchus or lung: Secondary | ICD-10-CM

## 2017-11-07 DIAGNOSIS — R5382 Chronic fatigue, unspecified: Secondary | ICD-10-CM

## 2017-11-07 LAB — CMP (CANCER CENTER ONLY)
ALT: 19 U/L (ref 0–55)
AST: 21 U/L (ref 5–34)
Albumin: 3.8 g/dL (ref 3.5–5.0)
Alkaline Phosphatase: 87 U/L (ref 40–150)
Anion gap: 11 (ref 3–11)
BUN: 16 mg/dL (ref 7–26)
CO2: 23 mmol/L (ref 22–29)
Calcium: 10 mg/dL (ref 8.4–10.4)
Chloride: 105 mmol/L (ref 98–109)
Creatinine: 0.89 mg/dL (ref 0.70–1.30)
GFR, Est AFR Am: >60 mL/min (ref >60)
GFR, Estimated: >60 mL/min (ref >60)
Glucose, Bld: 152 mg/dL — ABNORMAL HIGH (ref 70–140)
Potassium: 4 mmol/L (ref 3.5–5.1)
Sodium: 139 mmol/L (ref 136–145)
Total Bilirubin: 0.4 mg/dL (ref 0.2–1.2)
Total Protein: 6.9 g/dL (ref 6.4–8.3)

## 2017-11-07 LAB — CBC WITH DIFFERENTIAL (CANCER CENTER ONLY)
Basophils Absolute: 0 10*3/uL (ref 0.0–0.1)
Basophils Relative: 0 %
Eosinophils Absolute: 0 10*3/uL (ref 0.0–0.5)
Eosinophils Relative: 0 %
HCT: 29.2 % — ABNORMAL LOW (ref 38.4–49.9)
Hemoglobin: 9.6 g/dL — ABNORMAL LOW (ref 13.0–17.1)
Lymphocytes Relative: 16 %
Lymphs Abs: 0.6 10*3/uL — ABNORMAL LOW (ref 0.9–3.3)
MCH: 35.7 pg — ABNORMAL HIGH (ref 27.2–33.4)
MCHC: 32.9 g/dL (ref 32.0–36.0)
MCV: 108.6 fL — ABNORMAL HIGH (ref 79.3–98.0)
Monocytes Absolute: 0.5 10*3/uL (ref 0.1–0.9)
Monocytes Relative: 15 %
Neutro Abs: 2.5 10*3/uL (ref 1.5–6.5)
Neutrophils Relative %: 69 %
Platelet Count: 204 10*3/uL (ref 140–400)
RBC: 2.69 MIL/uL — ABNORMAL LOW (ref 4.20–5.82)
RDW: 17.4 % — ABNORMAL HIGH (ref 11.0–14.6)
WBC Count: 3.6 10*3/uL — ABNORMAL LOW (ref 4.0–10.3)

## 2017-11-07 LAB — TSH: TSH: 0.182 u[IU]/mL — ABNORMAL LOW (ref 0.320–4.118)

## 2017-11-07 MED ORDER — HEPARIN SOD (PORK) LOCK FLUSH 100 UNIT/ML IV SOLN
500.0000 [IU] | Freq: Once | INTRAVENOUS | Status: AC | PRN
  Administered 2017-11-07: 500 [IU]
  Filled 2017-11-07: qty 5

## 2017-11-07 MED ORDER — PROCHLORPERAZINE MALEATE 10 MG PO TABS
10.0000 mg | ORAL_TABLET | Freq: Once | ORAL | Status: AC
  Administered 2017-11-07: 10 mg via ORAL

## 2017-11-07 MED ORDER — SODIUM CHLORIDE 0.9% FLUSH
10.0000 mL | INTRAVENOUS | Status: DC | PRN
  Administered 2017-11-07: 10 mL via INTRAVENOUS
  Filled 2017-11-07: qty 10

## 2017-11-07 MED ORDER — SODIUM CHLORIDE 0.9 % IV SOLN
200.0000 mg | Freq: Once | INTRAVENOUS | Status: AC
  Administered 2017-11-07: 200 mg via INTRAVENOUS
  Filled 2017-11-07: qty 8

## 2017-11-07 MED ORDER — PEMETREXED DISODIUM CHEMO INJECTION 500 MG
500.0000 mg/m2 | Freq: Once | INTRAVENOUS | Status: AC
  Administered 2017-11-07: 1100 mg via INTRAVENOUS
  Filled 2017-11-07: qty 40

## 2017-11-07 MED ORDER — SODIUM CHLORIDE 0.9 % IV SOLN
Freq: Once | INTRAVENOUS | Status: AC
  Administered 2017-11-07: 11:00:00 via INTRAVENOUS

## 2017-11-07 MED ORDER — SODIUM CHLORIDE 0.9% FLUSH
10.0000 mL | INTRAVENOUS | Status: DC | PRN
  Administered 2017-11-07: 10 mL
  Filled 2017-11-07: qty 10

## 2017-11-07 MED ORDER — PROCHLORPERAZINE MALEATE 10 MG PO TABS
ORAL_TABLET | ORAL | Status: AC
  Filled 2017-11-07: qty 1

## 2017-11-07 NOTE — Telephone Encounter (Signed)
Next 3 cycles already scheduled per 4/24 los.

## 2017-11-07 NOTE — Assessment & Plan Note (Signed)
This is a very pleasant 67 year old white male with recurrent non-small cell lung cancer initially diagnosed as stage IIIB non-small cell lung cancer favoring adenocarcinoma status post concurrent chemoradiation followed by consolidation chemotherapy. His last treatment was in September 2017. Restaging scan showed evidence for disease progression and the patient was a started on systemic chemotherapy with carboplatin, Alimta and Keytruda for 4 cycles.  His repeat imaging studies showed improvement of his disease.  He is currently undergoing maintenance treatment with Alimta and Keytruda status post 4 cycles. The patient continues to tolerate this treatment fairly well with no concerning complaints except for lacrimation. Recommend that he proceed with cycle 5 of his maintenance treatment with Alimta and Keytruda today as scheduled. He will follow-up in 3 weeks for evaluation prior to cycle #6.  For the increased lacrimation, this is most likely secondary to treatment with Alimta.  I recommended for the patient to use over-the-counter saline eyedrops.  He was offered a referral to ophthalmology which he declined.  He was advised to call immediately if he has any concerning symptoms in the interval. The patient voices understanding of current disease status and treatment options and is in agreement with the current care plan. All questions were answered. The patient knows to call the clinic with any problems, questions or concerns. We can certainly see the patient much sooner if necessary.

## 2017-11-07 NOTE — Patient Instructions (Signed)
[facility] Discharge Instructions for Patients Receiving Chemotherapy  Today you received the following chemotherapy agents: Pembrolizumab (Keytruda) and Pemetrexed (Alimta).  To help prevent nausea and vomiting after your treatment, we encourage you to take your nausea medication as prescribed.  If you develop nausea and vomiting that is not controlled by your nausea medication, call the clinic.   BELOW ARE SYMPTOMS THAT SHOULD BE REPORTED IMMEDIATELY:  *FEVER GREATER THAN 100.5 F  *CHILLS WITH OR WITHOUT FEVER  NAUSEA AND VOMITING THAT IS NOT CONTROLLED WITH YOUR NAUSEA MEDICATION  *UNUSUAL SHORTNESS OF BREATH  *UNUSUAL BRUISING OR BLEEDING  TENDERNESS IN MOUTH AND THROAT WITH OR WITHOUT PRESENCE OF ULCERS  *URINARY PROBLEMS  *BOWEL PROBLEMS  UNUSUAL RASH Items with * indicate a potential emergency and should be followed up as soon as possible.  Feel free to call the clinic should you have any questions or concerns. The clinic phone number is [phone].  Please show the [facility] at check-in to the Emergency Department and triage nurse.

## 2017-11-07 NOTE — Progress Notes (Signed)
[facility] OFFICE PROGRESS NOTE  [name], MD [place] [address]  DIAGNOSIS: recurrent non-small cell lung cancer initially diagnosed as Stage IIIB (T1b, N3, M0) non-small cell lung cancer favoring adenocarcinoma presented with left lower lobe pulmonary nodule in addition to left hilar and bilateral mediastinal lymphadenopathy in addition to right supraclavicular lymph nodes diagnosed in April 2017.  PRIOR THERAPY: 1) Concurrent chemoradiation with weekly carboplatin for AUC of 2 and paclitaxel 45 mg/M2 started on 11/15/2015. He is status post 6 cycles. Last dose was getting 12/20/2015 with partial response. 2) Consolidation chemotherapy with carboplatin for AUC of 5 and paclitaxel 175 MG/M2 every 3 weeks with Neulasta support. Status post 3cycles. Last dose was given 04/03/2016.  CURRENT THERAPY: systemic chemotherapy with carboplatin for AUC of 5, Alimta 500 MG/M2 and Ketruda 200 MG IV every 3 weeks. First dose 05/16/2017.  Status post 8 cycles.  Starting from cycle #5 the patient is treated with maintenance Alimta and Keytruda only.  INTERVAL HISTORY: Kaisei Gilbo 67 y.o. male returns for routine follow-up visit accompanied by his wife.  The patient is feeling fine today has no specific complaints except for fatigue and increased lacrimation.  The increased lacrimation is likely related to his treatment with Alimta.  The patient denies fevers and chills.  Denies chest pain, shortness breath, cough, hemoptysis.  Denies nausea, vomiting, constipation, diarrhea.  Denies recent weight loss or night sweats.  Denies headaches and visual disturbances.  The patient is here for evaluation prior to cycle #9 of his treatment.  MEDICAL HISTORY: Past Medical History:  Diagnosis Date  . Adenocarcinoma, lung ([place]) 10/08/2015   2.4cm spiculated LLL nodule suspicious for primary bronchogenic carcinoma by CT 09/2015 - adenocarcinoma by endobronchial biopsy  .  Allergy   . Arthritis   . CAD (coronary artery disease)    mild MI; pt unaware - seen on stress test  . COPD (chronic obstructive pulmonary disease) ([place]) 06/26/2011   Mild centrilobular and paraseptal emphysema by CT 09/2015   . ED (erectile dysfunction)   . H/O anaphylactic shock 2013   hives,hypotension,syncope  . H/O seasonal allergies   . Herpes zoster 12/06/2015  . Hypertension   . Polycythemia 01/25/2014  . Prostate cancer [facility]) 2013   s/p radiation therapy March 2013, planned f/u with uro after XRT  . Radiation 09/18/2011 through 11/10/2011   Prostate 7600 cGy 40 sessions, seminal vesicles 5600 cGy 40 sessions    . Radiation 11/10/15-12/22/15   chest 60 Gy  . Shortness of breath dyspnea   . Smoker   . Thoracic aorta atherosclerosis ([place]) 10/08/2015   Mild by CT 09/2015     ALLERGIES:  is allergic to onion.  MEDICATIONS:  Current Outpatient Medications  Medication Sig Dispense Refill  . aspirin 81 MG tablet Take 81 mg by mouth daily. Reported on 12/21/2015    . budesonide-formoterol (SYMBICORT) 160-4.5 MCG/ACT inhaler Inhale 1 puff into the lungs 2 (two) times daily. 3 Inhaler 3  . cetirizine (ZYRTEC) 10 MG chewable tablet Chew 10 mg by mouth daily.    Marland Kitchen dexamethasone (DECADRON) 4 MG tablet 4 mg by mouth twice a day the day before, day of and after the chemotherapy every 3 weeks. 40 tablet 0  . diphenhydrAMINE (BENADRYL) 25 MG tablet Take 25 mg by mouth at bedtime as needed for sleep.    . folic acid (FOLVITE) 1 MG tablet TAKE 1 TABLET(1 MG) BY MOUTH DAILY 30 tablet 0  . guaiFENesin (  MUCINEX) 600 MG 12 hr tablet Take by mouth 2 (two) times daily.    Marland Kitchen ibuprofen (ADVIL,MOTRIN) 200 MG tablet Take 600 mg by mouth every 6 (six) hours as needed (Pain). Reported on 12/21/2015    . lidocaine-prilocaine (EMLA) cream Apply 1 application topically as needed. Squeeze a  small amount of cream to cotton ball and apply to port site 1-2 hours prior to chemotherapy. Do not rub in  the cream and cover  with plastic wrap. 30 g 0  . losartan-hydrochlorothiazide (HYZAAR) 100-25 MG tablet Take 1 tablet daily by mouth. 90 tablet 3  . prochlorperazine (COMPAZINE) 10 MG tablet Take 1 tablet (10 mg total) by mouth every 6 (six) hours as needed for nausea or vomiting. 30 tablet 0   No current facility-administered medications for this visit.    Facility-Administered Medications Ordered in Other Visits  Medication Dose Route Frequency Provider Last Rate Last Dose  . sodium chloride flush (NS) 0.9 % injection 10 mL  10 mL Intracatheter PRN [name], MD   10 mL at 11/07/17 1306    SURGICAL HISTORY:  Past Surgical History:  Procedure Laterality Date  . COLONOSCOPY W/ BIOPSIES AND POLYPECTOMY  10/11/15   mult TAs, 3cm rectal polypoid lesion; rec further treatment, diverticulosis ([facility])  . ENDOBRONCHIAL ULTRASOUND Bilateral 10/18/2015   Procedure: ENDOBRONCHIAL ULTRASOUND;  Surgeon: Javier Glazier, MD;  Location: [facility];  Service: Cardiopulmonary;  Laterality: Bilateral;  . HERNIA REPAIR Right 2005   with mesh  . INSERTION PROSTATE RADIATION SEED  08/2011   prostate cancer, [facility]ann[facility]baum  . IR FLUORO GUIDE PORT INSERTION RIGHT  07/26/2017  . IR US GUIDE VASC ACCESS RIGHT  07/26/2017    REVIEW OF SYSTEMS:   Review of Systems  Constitutional: Negative for appetite change, chills, fever and unexpected weight change. Positive for fatigue. HENT:   Negative for mouth sores, nosebleeds, sore throat and trouble swallowing.   Eyes: Negative for icterus. Positive for increased lacrimation to both eyes. Respiratory: Negative for cough, hemoptysis, shortness of breath and wheezing.   Cardiovascular: Negative for chest pain and leg swelling.  Gastrointestinal: Negative for abdominal pain, constipation, diarrhea, nausea and vomiting.  Genitourinary: Negative for bladder incontinence, difficulty urinating, dysuria, frequency and hematuria.   Musculoskeletal: Negative for  back pain, gait problem, neck pain and neck stiffness.  Skin: Negative for itching and rash.  Neurological: Negative for dizziness, extremity weakness, gait problem, headaches, light-headedness and seizures.  Hematological: Negative for adenopathy. Does not bruise/bleed easily.  Psychiatric/Behavioral: Negative for confusion, depression and sleep disturbance. The patient is not nervous/anxious.     PHYSICAL EXAMINATION:  Blood pressure 131/66, pulse (!) 56, temperature 97.9 F (36.6 C), temperature source Oral, resp. rate 18, height 5\' 11"  (1.803 m), weight 209 lb 4.8 oz (94.9 kg), SpO2 100 %.  ECOG PERFORMANCE STATUS: 1 - Symptomatic but completely ambulatory  Physical Exam  Constitutional: Oriented to person, place, and time and well-developed, well-nourished, and in no distress. No distress.  HENT:  Head: Normocephalic and atraumatic.  Mouth/Throat: Oropharynx is clear and moist. No oropharyngeal exudate.  Eyes: Conjunctivae are normal. Right eye exhibits no discharge. Left eye exhibits no discharge. No scleral icterus.  Neck: Normal range of motion. Neck supple.  Cardiovascular: Normal rate, regular rhythm, normal heart sounds and intact distal pulses.   Pulmonary/Chest: Effort normal and breath sounds normal. No respiratory distress. No wheezes. No rales.  Abdominal: Soft. Bowel sounds are normal. Exhibits no distension and no mass. There is no tenderness.  Musculoskeletal: Normal range of motion. Exhibits no edema.  Lymphadenopathy:    No cervical adenopathy.  Neurological: Alert and oriented to person, place, and time. Exhibits normal muscle tone. Gait normal. Coordination normal.  Skin: Skin is warm and dry. No rash noted. Not diaphoretic. No erythema. No pallor.  Psychiatric: Mood, memory and judgment normal.  Vitals reviewed.  LABORATORY DATA: Lab Results  Component Value Date   WBC 3.6 (L) 11/07/2017   HGB 9.6 (L) 11/07/2017   HCT 29.2 (L) 11/07/2017   MCV 108.6 (H)  11/07/2017   PLT 204 11/07/2017      Chemistry      Component Value Date/Time   NA 139 11/07/2017 0919   NA 138 07/18/2017 1000   K 4.0 11/07/2017 0919   K 4.0 07/18/2017 1000   CL 105 11/07/2017 0919   CO2 23 11/07/2017 0919   CO2 25 07/18/2017 1000   BUN 16 11/07/2017 0919   BUN 14.9 07/18/2017 1000   CREATININE 0.89 11/07/2017 0919   CREATININE 0.8 07/18/2017 1000      Component Value Date/Time   CALCIUM 10.0 11/07/2017 0919   CALCIUM 9.7 07/18/2017 1000   ALKPHOS 87 11/07/2017 0919   ALKPHOS 93 07/18/2017 1000   AST 21 11/07/2017 0919   AST 22 07/18/2017 1000   ALT 19 11/07/2017 0919   ALT 24 07/18/2017 1000   BILITOT 0.4 11/07/2017 0919   BILITOT 0.54 07/18/2017 1000       RADIOGRAPHIC STUDIES:  Ct Chest W Contrast  Result Date: 10/17/2017 CLINICAL DATA:  Left lung cancer, chemotherapy in progress. Radiation therapy finished in 2017. Prostate cancer. EXAM: CT CHEST WITH CONTRAST TECHNIQUE: Multidetector CT imaging of the chest was performed during intravenous contrast administration. CONTRAST:  47mL ISOVUE-300 IOPAMIDOL (ISOVUE-300) INJECTION 61% COMPARISON:  07/16/2017 and PET 03/08/2017. FINDINGS: Cardiovascular: Right IJ Port-A-Cath terminates in the SVC. Atherosclerotic calcification of the arterial vasculature, including coronary arteries. Heart is enlarged. Left ventricle is dilated. No pericardial effusion. Mediastinum/Nodes: Mediastinal lymph nodes measure up to 10 mm in the low right paratracheal station, as before. No hilar or axillary adenopathy. Esophagus is grossly unremarkable. Lungs/Pleura: Subpleural scarring in the medial upper right hemithorax. Infrahilar superior segment left lower lobe nodule is best measured on soft tissue windows, measuring 1.7 x 2.2 cm, stable. Post treatment scarring in the left lower lobe. No pleural fluid. Airway is unremarkable. Upper Abdomen: Visualized portion of the liver is unremarkable. Noncalcified stones in the gallbladder.  Visualized portions of the adrenal glands are unremarkable. Subcentimeter low-attenuation lesions in the kidneys are too small to characterize. Visualized portions of spleen, pancreas, stomach and bowel are grossly unremarkable. Gastrohepatic ligament lymph nodes measure up to 12 mm as before. Musculoskeletal: Degenerative changes in the spine. No worrisome lytic or sclerotic lesions. IMPRESSION: 1. Stable infrahilar left lower lobe nodule and post treatment scarring. 2. Borderline enlarged gastrohepatic lymph nodes, stable. 3. Aortic atherosclerosis (ICD10-170.0). Coronary artery calcification. 4. Cholelithiasis. Electronically Signed   By: Lorin Picket M.D.   On: 10/17/2017 08:51     ASSESSMENT/PLAN:  Cancer of lower lobe of left lung [place] [facility]) This is a very pleasant 67 year old white male with recurrent non-small cell lung cancer initially diagnosed as stage IIIB non-small cell lung cancer favoring adenocarcinoma status post concurrent chemoradiation followed by consolidation chemotherapy. His last treatment was in September 2017. Restaging scan showed evidence for disease progression and the patient was a started on systemic chemotherapy with carboplatin, Alimta and Keytruda for 4 cycles.  His repeat imaging studies showed improvement of his disease.  He is currently undergoing maintenance treatment with Alimta and Keytruda status post 4 cycles. The patient continues to tolerate this treatment fairly well with no concerning complaints except for lacrimation. Recommend that he proceed with cycle 5 of his maintenance treatment with Alimta and Keytruda today as scheduled. He will follow-up in 3 weeks for evaluation prior to cycle #6.  For the increased lacrimation, this is most likely secondary to treatment with Alimta.  I recommended for the patient to use over-the-counter saline eyedrops.  He was offered a referral to ophthalmology which he declined.  He was advised to call immediately if he has any  concerning symptoms in the interval. The patient voices understanding of current disease status and treatment options and is in agreement with the current care plan. All questions were answered. The patient knows to call the clinic with any problems, questions or concerns. We can certainly see the patient much sooner if necessary.   No orders of the defined types were placed in this encounter.  Mikey Bussing, DNP, AGPCNP-BC, AOCNP 11/07/17

## 2017-11-07 NOTE — Patient Instructions (Signed)

## 2017-11-26 ENCOUNTER — Other Ambulatory Visit: Payer: Self-pay | Admitting: Internal Medicine

## 2017-11-26 DIAGNOSIS — Z5112 Encounter for antineoplastic immunotherapy: Secondary | ICD-10-CM

## 2017-11-26 DIAGNOSIS — R5382 Chronic fatigue, unspecified: Secondary | ICD-10-CM

## 2017-11-26 DIAGNOSIS — Z23 Encounter for immunization: Secondary | ICD-10-CM

## 2017-11-26 DIAGNOSIS — Z7189 Other specified counseling: Secondary | ICD-10-CM

## 2017-11-26 DIAGNOSIS — C3432 Malignant neoplasm of lower lobe, left bronchus or lung: Secondary | ICD-10-CM

## 2017-11-28 ENCOUNTER — Inpatient Hospital Stay: Payer: BLUE CROSS/BLUE SHIELD

## 2017-11-28 ENCOUNTER — Inpatient Hospital Stay (HOSPITAL_BASED_OUTPATIENT_CLINIC_OR_DEPARTMENT_OTHER): Payer: BLUE CROSS/BLUE SHIELD | Admitting: Oncology

## 2017-11-28 ENCOUNTER — Encounter: Payer: Self-pay | Admitting: Oncology

## 2017-11-28 ENCOUNTER — Inpatient Hospital Stay: Payer: BLUE CROSS/BLUE SHIELD | Attending: Internal Medicine

## 2017-11-28 ENCOUNTER — Telehealth: Payer: Self-pay | Admitting: Oncology

## 2017-11-28 VITALS — BP 126/73 | HR 69 | Temp 98.2°F | Resp 17 | Ht 71.0 in | Wt 209.2 lb

## 2017-11-28 DIAGNOSIS — C3432 Malignant neoplasm of lower lobe, left bronchus or lung: Secondary | ICD-10-CM

## 2017-11-28 DIAGNOSIS — Z5111 Encounter for antineoplastic chemotherapy: Secondary | ICD-10-CM

## 2017-11-28 DIAGNOSIS — H04209 Unspecified epiphora, unspecified lacrimal gland: Secondary | ICD-10-CM

## 2017-11-28 DIAGNOSIS — Z5112 Encounter for antineoplastic immunotherapy: Secondary | ICD-10-CM

## 2017-11-28 DIAGNOSIS — R5382 Chronic fatigue, unspecified: Secondary | ICD-10-CM

## 2017-11-28 LAB — CBC WITH DIFFERENTIAL (CANCER CENTER ONLY)
Basophils Absolute: 0 10*3/uL (ref 0.0–0.1)
Basophils Relative: 0 %
Eosinophils Absolute: 0 10*3/uL (ref 0.0–0.5)
Eosinophils Relative: 0 %
HCT: 31.6 % — ABNORMAL LOW (ref 38.4–49.9)
Hemoglobin: 10.5 g/dL — ABNORMAL LOW (ref 13.0–17.1)
Lymphocytes Relative: 16 %
Lymphs Abs: 0.6 10*3/uL — ABNORMAL LOW (ref 0.9–3.3)
MCH: 36.3 pg — ABNORMAL HIGH (ref 27.2–33.4)
MCHC: 33.2 g/dL (ref 32.0–36.0)
MCV: 109.3 fL — ABNORMAL HIGH (ref 79.3–98.0)
Monocytes Absolute: 0.6 10*3/uL (ref 0.1–0.9)
Monocytes Relative: 17 %
Neutro Abs: 2.4 10*3/uL (ref 1.5–6.5)
Neutrophils Relative %: 67 %
Platelet Count: 198 10*3/uL (ref 140–400)
RBC: 2.89 MIL/uL — ABNORMAL LOW (ref 4.20–5.82)
RDW: 17 % — ABNORMAL HIGH (ref 11.0–14.6)
WBC Count: 3.6 10*3/uL — ABNORMAL LOW (ref 4.0–10.3)

## 2017-11-28 LAB — CMP (CANCER CENTER ONLY)
ALT: 31 U/L (ref 0–55)
AST: 30 U/L (ref 5–34)
Albumin: 3.9 g/dL (ref 3.5–5.0)
Alkaline Phosphatase: 95 U/L (ref 40–150)
Anion gap: 6 (ref 3–11)
BUN: 12 mg/dL (ref 7–26)
CO2: 28 mmol/L (ref 22–29)
Calcium: 9.5 mg/dL (ref 8.4–10.4)
Chloride: 103 mmol/L (ref 98–109)
Creatinine: 0.81 mg/dL (ref 0.70–1.30)
GFR, Est AFR Am: >60 mL/min (ref >60)
GFR, Estimated: >60 mL/min (ref >60)
Glucose, Bld: 127 mg/dL (ref 70–140)
Potassium: 4.1 mmol/L (ref 3.5–5.1)
Sodium: 137 mmol/L (ref 136–145)
Total Bilirubin: 0.4 mg/dL (ref 0.2–1.2)
Total Protein: 6.9 g/dL (ref 6.4–8.3)

## 2017-11-28 LAB — TSH: TSH: 0.18 u[IU]/mL — ABNORMAL LOW (ref 0.320–4.118)

## 2017-11-28 MED ORDER — SODIUM CHLORIDE 0.9% FLUSH
10.0000 mL | INTRAVENOUS | Status: DC | PRN
  Administered 2017-11-28: 10 mL
  Filled 2017-11-28: qty 10

## 2017-11-28 MED ORDER — SODIUM CHLORIDE 0.9 % IV SOLN
200.0000 mg | Freq: Once | INTRAVENOUS | Status: AC
  Administered 2017-11-28: 200 mg via INTRAVENOUS
  Filled 2017-11-28: qty 8

## 2017-11-28 MED ORDER — SODIUM CHLORIDE 0.9 % IV SOLN
Freq: Once | INTRAVENOUS | Status: AC
  Administered 2017-11-28: 11:00:00 via INTRAVENOUS

## 2017-11-28 MED ORDER — HEPARIN SOD (PORK) LOCK FLUSH 100 UNIT/ML IV SOLN
500.0000 [IU] | Freq: Once | INTRAVENOUS | Status: AC | PRN
  Administered 2017-11-28: 500 [IU]
  Filled 2017-11-28: qty 5

## 2017-11-28 MED ORDER — PROCHLORPERAZINE MALEATE 10 MG PO TABS
ORAL_TABLET | ORAL | Status: AC
  Filled 2017-11-28: qty 1

## 2017-11-28 MED ORDER — SODIUM CHLORIDE 0.9 % IV SOLN
500.0000 mg/m2 | Freq: Once | INTRAVENOUS | Status: AC
  Administered 2017-11-28: 1100 mg via INTRAVENOUS
  Filled 2017-11-28: qty 40

## 2017-11-28 MED ORDER — SODIUM CHLORIDE 0.9% FLUSH
10.0000 mL | INTRAVENOUS | Status: DC | PRN
Start: 2017-11-28 — End: 2017-11-28
  Administered 2017-11-28: 10 mL via INTRAVENOUS
  Filled 2017-11-28: qty 10

## 2017-11-28 MED ORDER — PROCHLORPERAZINE MALEATE 10 MG PO TABS
10.0000 mg | ORAL_TABLET | Freq: Once | ORAL | Status: AC
  Administered 2017-11-28: 10 mg via ORAL

## 2017-11-28 NOTE — Patient Instructions (Signed)
Steps to Quit Smoking Smoking tobacco can be bad for your health. It can also affect almost every organ in your body. Smoking puts you and people around you at risk for many serious long-lasting (chronic) diseases. Quitting smoking is hard, but it is one of the best things that you can do for your health. It is never too late to quit. What are the benefits of quitting smoking? When you quit smoking, you lower your risk for getting serious diseases and conditions. They can include:  Lung cancer or lung disease.  Heart disease.  Stroke.  Heart attack.  Not being able to have children (infertility).  Weak bones (osteoporosis) and broken bones (fractures).  If you have coughing, wheezing, and shortness of breath, those symptoms may get better when you quit. You may also get sick less often. If you are pregnant, quitting smoking can help to lower your chances of having a baby of low birth weight. What can I do to help me quit smoking? Talk with your doctor about what can help you quit smoking. Some things you can do (strategies) include:  Quitting smoking totally, instead of slowly cutting back how much you smoke over a period of time.  Going to in-person counseling. You are more likely to quit if you go to many counseling sessions.  Using resources and support systems, such as: ? Online chats with a counselor. ? Phone quitlines. ? Printed self-help materials. ? Support groups or group counseling. ? Text messaging programs. ? Mobile phone apps or applications.  Taking medicines. Some of these medicines may have nicotine in them. If you are pregnant or breastfeeding, do not take any medicines to quit smoking unless your doctor says it is okay. Talk with your doctor about counseling or other things that can help you.  Talk with your doctor about using more than one strategy at the same time, such as taking medicines while you are also going to in-person counseling. This can help make  quitting easier. What things can I do to make it easier to quit? Quitting smoking might feel very hard at first, but there is a lot that you can do to make it easier. Take these steps:  Talk to your family and friends. Ask them to support and encourage you.  Call phone quitlines, reach out to support groups, or work with a counselor.  Ask people who smoke to not smoke around you.  Avoid places that make you want (trigger) to smoke, such as: ? Bars. ? Parties. ? Smoke-break areas at work.  Spend time with people who do not smoke.  Lower the stress in your life. Stress can make you want to smoke. Try these things to help your stress: ? Getting regular exercise. ? Deep-breathing exercises. ? Yoga. ? Meditating. ? Doing a body scan. To do this, close your eyes, focus on one area of your body at a time from head to toe, and notice which parts of your body are tense. Try to relax the muscles in those areas.  Download or buy apps on your mobile phone or tablet that can help you stick to your quit plan. There are many free apps, such as QuitGuide from the CDC (Centers for Disease Control and Prevention). You can find more support from smokefree.gov and other websites.  This information is not intended to replace advice given to you by your health care provider. Make sure you discuss any questions you have with your health care provider. Document Released: 04/29/2009 Document   Revised: 02/29/2016 Document Reviewed: 11/17/2014 Elsevier Interactive Patient Education  2018 [employer]  

## 2017-11-28 NOTE — Progress Notes (Signed)
[facility] OFFICE PROGRESS NOTE  [name], MD [place] [address]  DIAGNOSIS: recurrent non-small cell lung cancer initially diagnosed as Stage IIIB (T1b, N3, M0) non-small cell lung cancer favoring adenocarcinoma presented with left lower lobe pulmonary nodule in addition to left hilar and bilateral mediastinal lymphadenopathy in addition to right supraclavicular lymph nodes diagnosed in April 2017.  PRIOR THERAPY: 1) Concurrent chemoradiation with weekly carboplatin for AUC of 2 and paclitaxel 45 mg/M2 started on 11/15/2015. He is status post 6 cycles. Last dose was getting 12/20/2015 with partial response. 2) Consolidation chemotherapy with carboplatin for AUC of 5 and paclitaxel 175 MG/M2 every 3 weeks with Neulasta support. Status post 3cycles. Last dose was given 04/03/2016.  CURRENT THERAPY: systemic chemotherapy with carboplatin for AUC of 5, Alimta 500 MG/M2 and Ketruda 200 MG IV every 3 weeks. First dose 05/16/2017. Status post 9 cycles.Starting from cycle #5 the patient is treated with maintenance Alimta and Keytruda only.  INTERVAL HISTORY: Brian White 67 y.o. male returns for routine follow-up visit accompanied by his wife.  The patient is feeling fine today and has no specific complaints except for fatigue and increased lacrimation.  This is unchanged from prior reports and is likely related to his Alimta.  The patient denies fevers and chills.  Denies chest pain, shortness breath, cough, hemoptysis.  Denies nausea, vomiting, constipation, diarrhea.  Denies recent weight loss or night sweats.  The patient is here for evaluation prior to cycle #10 of his treatment.  MEDICAL HISTORY: Past Medical History:  Diagnosis Date  . Adenocarcinoma, lung ([place]) 10/08/2015   2.4cm spiculated LLL nodule suspicious for primary bronchogenic carcinoma by CT 09/2015 - adenocarcinoma by endobronchial biopsy  . Allergy   . Arthritis   . CAD  (coronary artery disease)    mild MI; pt unaware - seen on stress test  . COPD (chronic obstructive pulmonary disease) ([place]) 06/26/2011   Mild centrilobular and paraseptal emphysema by CT 09/2015   . ED (erectile dysfunction)   . H/O anaphylactic shock 2013   hives,hypotension,syncope  . H/O seasonal allergies   . Herpes zoster 12/06/2015  . Hypertension   . Polycythemia 01/25/2014  . Prostate cancer [facility]) 2013   s/p radiation therapy March 2013, planned f/u with uro after XRT  . Radiation 09/18/2011 through 11/10/2011   Prostate 7600 cGy 40 sessions, seminal vesicles 5600 cGy 40 sessions    . Radiation 11/10/15-12/22/15   chest 60 Gy  . Shortness of breath dyspnea   . Smoker   . Thoracic aorta atherosclerosis ([place]) 10/08/2015   Mild by CT 09/2015     ALLERGIES:  is allergic to onion.  MEDICATIONS:  Current Outpatient Medications  Medication Sig Dispense Refill  . aspirin 81 MG tablet Take 81 mg by mouth daily. Reported on 12/21/2015    . budesonide-formoterol (SYMBICORT) 160-4.5 MCG/ACT inhaler Inhale 1 puff into the lungs 2 (two) times daily. 3 Inhaler 3  . cetirizine (ZYRTEC) 10 MG chewable tablet Chew 10 mg by mouth daily.    Marland Kitchen dexamethasone (DECADRON) 4 MG tablet 4 mg by mouth twice a day the day before, day of and after the chemotherapy every 3 weeks. 40 tablet 0  . diphenhydrAMINE (BENADRYL) 25 MG tablet Take 25 mg by mouth at bedtime as needed for sleep.    . folic acid (FOLVITE) 1 MG tablet TAKE 1 TABLET(1 MG) BY MOUTH DAILY 30 tablet 0  . guaiFENesin (MUCINEX) 600 MG 12 hr tablet  Take by mouth 2 (two) times daily.    Marland Kitchen ibuprofen (ADVIL,MOTRIN) 200 MG tablet Take 600 mg by mouth every 6 (six) hours as needed (Pain). Reported on 12/21/2015    . lidocaine-prilocaine (EMLA) cream Apply 1 application topically as needed. Squeeze a  small amount of cream to cotton ball and apply to port site 1-2 hours prior to chemotherapy. Do not rub in the cream and cover  with  plastic wrap. 30 g 0  . losartan-hydrochlorothiazide (HYZAAR) 100-25 MG tablet Take 1 tablet daily by mouth. 90 tablet 3  . prochlorperazine (COMPAZINE) 10 MG tablet Take 1 tablet (10 mg total) by mouth every 6 (six) hours as needed for nausea or vomiting. 30 tablet 0   No current facility-administered medications for this visit.     SURGICAL HISTORY:  Past Surgical History:  Procedure Laterality Date  . COLONOSCOPY W/ BIOPSIES AND POLYPECTOMY  10/11/15   mult TAs, 3cm rectal polypoid lesion; rec further treatment, diverticulosis ([facility])  . ENDOBRONCHIAL ULTRASOUND Bilateral 10/18/2015   Procedure: ENDOBRONCHIAL ULTRASOUND;  Surgeon: Javier Glazier, MD;  Location: [facility];  Service: Cardiopulmonary;  Laterality: Bilateral;  . HERNIA REPAIR Right 2005   with mesh  . INSERTION PROSTATE RADIATION SEED  08/2011   prostate cancer, [facility]ann[facility]baum  . IR FLUORO GUIDE PORT INSERTION RIGHT  07/26/2017  . IR US GUIDE VASC ACCESS RIGHT  07/26/2017    REVIEW OF SYSTEMS:   Review of Systems  Constitutional: Negative for appetite change, chills, fever and unexpected weight change. Positive for fatigue. HENT:   Negative for mouth sores, nosebleeds, sore throat and trouble swallowing.   Eyes: Positive for increased lacrimation secondary to Alimta.  Respiratory: Negative for cough, hemoptysis, shortness of breath and wheezing.   Cardiovascular: Negative for chest pain and leg swelling.  Gastrointestinal: Negative for abdominal pain, constipation, diarrhea, nausea and vomiting.  Genitourinary: Negative for bladder incontinence, difficulty urinating, dysuria, frequency and hematuria.   Musculoskeletal: Negative for back pain, gait problem, neck pain and neck stiffness.  Skin: Negative for itching and rash.  Neurological: Negative for dizziness, extremity weakness, gait problem, headaches, light-headedness and seizures.  Hematological: Negative for adenopathy. Does not bruise/bleed easily.   Psychiatric/Behavioral: Negative for confusion, depression and sleep disturbance. The patient is not nervous/anxious.     PHYSICAL EXAMINATION:  Blood pressure 126/73, pulse 69, temperature 98.2 F (36.8 C), temperature source Oral, resp. rate 17, height 5\' 11"  (1.803 m), weight 209 lb 3.2 oz (94.9 kg), SpO2 100 %.  ECOG PERFORMANCE STATUS: 1 - Symptomatic but completely ambulatory  Physical Exam  Constitutional: Oriented to person, place, and time and well-developed, well-nourished, and in no distress. No distress.  HENT:  Head: Normocephalic and atraumatic.  Mouth/Throat: Oropharynx is clear and moist. No oropharyngeal exudate.  Eyes: Conjunctivae are normal. Right eye exhibits no discharge. Left eye exhibits no discharge. No scleral icterus.  Neck: Normal range of motion. Neck supple.  Cardiovascular: Normal rate, regular rhythm, normal heart sounds and intact distal pulses.   Pulmonary/Chest: Effort normal and breath sounds normal. No respiratory distress. No wheezes. No rales.  Abdominal: Soft. Bowel sounds are normal. Exhibits no distension and no mass. There is no tenderness.  Musculoskeletal: Normal range of motion. Exhibits no edema.  Lymphadenopathy:    No cervical adenopathy.  Neurological: Alert and oriented to person, place, and time. Exhibits normal muscle tone. Gait normal. Coordination normal.  Skin: Skin is warm and dry. No rash noted. Not diaphoretic. No erythema. No pallor.  Psychiatric:  Mood, memory and judgment normal.  Vitals reviewed.  LABORATORY DATA: Lab Results  Component Value Date   WBC 3.6 (L) 11/28/2017   HGB 10.5 (L) 11/28/2017   HCT 31.6 (L) 11/28/2017   MCV 109.3 (H) 11/28/2017   PLT 198 11/28/2017      Chemistry      Component Value Date/Time   NA 137 11/28/2017 0914   NA 138 07/18/2017 1000   K 4.1 11/28/2017 0914   K 4.0 07/18/2017 1000   CL 103 11/28/2017 0914   CO2 28 11/28/2017 0914   CO2 25 07/18/2017 1000   BUN 12 11/28/2017  0914   BUN 14.9 07/18/2017 1000   CREATININE 0.81 11/28/2017 0914   CREATININE 0.8 07/18/2017 1000      Component Value Date/Time   CALCIUM 9.5 11/28/2017 0914   CALCIUM 9.7 07/18/2017 1000   ALKPHOS 95 11/28/2017 0914   ALKPHOS 93 07/18/2017 1000   AST 30 11/28/2017 0914   AST 22 07/18/2017 1000   ALT 31 11/28/2017 0914   ALT 24 07/18/2017 1000   BILITOT 0.4 11/28/2017 0914   BILITOT 0.54 07/18/2017 1000       RADIOGRAPHIC STUDIES:  No results found.   ASSESSMENT/PLAN:  Cancer of lower lobe of left lung [facility]) This is a very pleasant 67 year old white male with recurrent non-small cell lung cancer initially diagnosed as stage IIIB non-small cell lung cancer favoring adenocarcinoma status post concurrent chemoradiation followed by consolidation chemotherapy. His last treatment was in September 2017. Restaging scan showed evidence for disease progression and the patient was a started on systemic chemotherapy with carboplatin, Alimta and Keytruda for 4 cycles. His repeat imaging studies showed improvement of his disease. He is currently undergoing maintenance treatment with Alimta and Keytruda status post 5cycles. The patient continues to tolerate this treatment fairly well with no concerning complaints except for lacrimation. Recommend that he proceed with cycle 6 of his maintenance treatment with Alimta and Keytruda today as scheduled. The patient will have a restaging CT scan of the chest prior to his next visit. The patient will follow-up in 3 weeks for evaluation prior to cycle #7 and to review his restaging CT scan results..  For the increased lacrimation, this is most likely secondary to treatment with Alimta.  He is using over-the-counter allergy eyedrops which he thinks helps some.   I again offered a referral to ophthalmology which he declined.  He was advised to call immediately if he has any concerning symptoms in the interval. The patient voices understanding of  current disease status and treatment options and is in agreement with the current care plan. All questions were answered. The patient knows to call the clinic with any problems, questions or concerns. We can certainly see the patient much sooner if necessary.   Orders Placed This Encounter  Procedures  . CT CHEST W CONTRAST    Standing Status:   Future    Standing Expiration Date:   11/29/2018    Order Specific Question:   If indicated for the ordered procedure, I authorize the administration of contrast media per Radiology protocol    Answer:   Yes    Order Specific Question:   Preferred imaging location?    Answer:   [facility]    Order Specific Question:   Radiology Contrast Protocol - do NOT remove file path    Answer:   [email or web address]    Order Specific Question:   Reason for Exam additional comments  Answer:   lung cancer. Restaging.   Mikey Bussing, DNP, AGPCNP-BC, AOCNP 11/28/17

## 2017-11-28 NOTE — Telephone Encounter (Signed)
Scheduled apt per 5/15 los - pt to get an updated schedule next visit

## 2017-11-28 NOTE — Assessment & Plan Note (Signed)
This is a very pleasant 67 year old white male with recurrent non-small cell lung cancer initially diagnosed as stage IIIB non-small cell lung cancer favoring adenocarcinoma status post concurrent chemoradiation followed by consolidation chemotherapy. His last treatment was in September 2017. Restaging scan showed evidence for disease progression and the patient was a started on systemic chemotherapy with carboplatin, Alimta and Keytruda for 4 cycles. His repeat imaging studies showed improvement of his disease. He is currently undergoing maintenance treatment with Alimta and Keytruda status post 5cycles. The patient continues to tolerate this treatment fairly well with no concerning complaints except for lacrimation. Recommend that he proceed with cycle 6 of his maintenance treatment with Alimta and Keytruda today as scheduled. The patient will have a restaging CT scan of the chest prior to his next visit. The patient will follow-up in 3 weeks for evaluation prior to cycle #7 and to review his restaging CT scan results..  For the increased lacrimation, this is most likely secondary to treatment with Alimta.  He is using over-the-counter allergy eyedrops which he thinks helps some.   I again offered a referral to ophthalmology which he declined.  He was advised to call immediately if he has any concerning symptoms in the interval. The patient voices understanding of current disease status and treatment options and is in agreement with the current care plan. All questions were answered. The patient knows to call the clinic with any problems, questions or concerns. We can certainly see the patient much sooner if necessary.

## 2017-11-28 NOTE — Patient Instructions (Signed)
[facility] Discharge Instructions for Patients Receiving Chemotherapy  Today you received the following chemotherapy agents Keytruda and Alimta  To help prevent nausea and vomiting after your treatment, we encourage you to take your nausea medication as directed   If you develop nausea and vomiting that is not controlled by your nausea medication, call the clinic.   BELOW ARE SYMPTOMS THAT SHOULD BE REPORTED IMMEDIATELY:  *FEVER GREATER THAN 100.5 F  *CHILLS WITH OR WITHOUT FEVER  NAUSEA AND VOMITING THAT IS NOT CONTROLLED WITH YOUR NAUSEA MEDICATION  *UNUSUAL SHORTNESS OF BREATH  *UNUSUAL BRUISING OR BLEEDING  TENDERNESS IN MOUTH AND THROAT WITH OR WITHOUT PRESENCE OF ULCERS  *URINARY PROBLEMS  *BOWEL PROBLEMS  UNUSUAL RASH Items with * indicate a potential emergency and should be followed up as soon as possible.  Feel free to call the clinic should you have any questions or concerns. The clinic phone number is [phone].  Please show the [facility] at check-in to the Emergency Department and triage nurse.

## 2017-12-17 ENCOUNTER — Ambulatory Visit (HOSPITAL_COMMUNITY)
Admission: RE | Admit: 2017-12-17 | Discharge: 2017-12-17 | Disposition: A | Discharge status: Home / Self Care | Payer: BLUE CROSS/BLUE SHIELD | Source: Ambulatory Visit | Attending: Oncology | Admitting: Oncology

## 2017-12-17 ENCOUNTER — Encounter (HOSPITAL_COMMUNITY): Payer: Self-pay

## 2017-12-17 DIAGNOSIS — I252 Old myocardial infarction: Secondary | ICD-10-CM | POA: Insufficient documentation

## 2017-12-17 DIAGNOSIS — C3432 Malignant neoplasm of lower lobe, left bronchus or lung: Secondary | ICD-10-CM

## 2017-12-17 DIAGNOSIS — Z5111 Encounter for antineoplastic chemotherapy: Secondary | ICD-10-CM | POA: Insufficient documentation

## 2017-12-17 DIAGNOSIS — R590 Localized enlarged lymph nodes: Secondary | ICD-10-CM | POA: Diagnosis not present

## 2017-12-17 DIAGNOSIS — I700 Atherosclerosis of aorta: Secondary | ICD-10-CM | POA: Diagnosis not present

## 2017-12-17 MED ORDER — IOHEXOL 300 MG/ML  SOLN
75.0000 mL | Freq: Once | INTRAMUSCULAR | Status: AC | PRN
  Administered 2017-12-17: 75 mL via INTRAVENOUS

## 2017-12-19 ENCOUNTER — Inpatient Hospital Stay: Payer: BLUE CROSS/BLUE SHIELD | Attending: Internal Medicine

## 2017-12-19 ENCOUNTER — Inpatient Hospital Stay: Payer: BLUE CROSS/BLUE SHIELD

## 2017-12-19 ENCOUNTER — Telehealth: Payer: Self-pay | Admitting: Internal Medicine

## 2017-12-19 ENCOUNTER — Inpatient Hospital Stay (HOSPITAL_BASED_OUTPATIENT_CLINIC_OR_DEPARTMENT_OTHER): Payer: BLUE CROSS/BLUE SHIELD | Admitting: Internal Medicine

## 2017-12-19 ENCOUNTER — Encounter: Payer: Self-pay | Admitting: Internal Medicine

## 2017-12-19 VITALS — BP 109/63 | HR 75 | Temp 99.0°F | Resp 18 | Ht 71.0 in | Wt 211.1 lb

## 2017-12-19 DIAGNOSIS — C3432 Malignant neoplasm of lower lobe, left bronchus or lung: Secondary | ICD-10-CM

## 2017-12-19 DIAGNOSIS — I10 Essential (primary) hypertension: Secondary | ICD-10-CM

## 2017-12-19 DIAGNOSIS — H04209 Unspecified epiphora, unspecified lacrimal gland: Secondary | ICD-10-CM | POA: Insufficient documentation

## 2017-12-19 DIAGNOSIS — Z5111 Encounter for antineoplastic chemotherapy: Secondary | ICD-10-CM | POA: Diagnosis present

## 2017-12-19 DIAGNOSIS — L298 Other pruritus: Secondary | ICD-10-CM | POA: Diagnosis not present

## 2017-12-19 DIAGNOSIS — R5382 Chronic fatigue, unspecified: Secondary | ICD-10-CM

## 2017-12-19 DIAGNOSIS — R21 Rash and other nonspecific skin eruption: Secondary | ICD-10-CM | POA: Diagnosis not present

## 2017-12-19 DIAGNOSIS — Z9221 Personal history of antineoplastic chemotherapy: Secondary | ICD-10-CM | POA: Diagnosis not present

## 2017-12-19 DIAGNOSIS — Z5112 Encounter for antineoplastic immunotherapy: Secondary | ICD-10-CM | POA: Diagnosis not present

## 2017-12-19 DIAGNOSIS — T50905A Adverse effect of unspecified drugs, medicaments and biological substances, initial encounter: Secondary | ICD-10-CM

## 2017-12-19 DIAGNOSIS — Z923 Personal history of irradiation: Secondary | ICD-10-CM | POA: Insufficient documentation

## 2017-12-19 LAB — CBC WITH DIFFERENTIAL (CANCER CENTER ONLY)
Basophils Absolute: 0 10*3/uL (ref 0.0–0.1)
Basophils Relative: 0 %
Eosinophils Absolute: 0 10*3/uL (ref 0.0–0.5)
Eosinophils Relative: 0 %
HCT: 30.2 % — ABNORMAL LOW (ref 38.4–49.9)
Hemoglobin: 10 g/dL — ABNORMAL LOW (ref 13.0–17.1)
Lymphocytes Relative: 13 %
Lymphs Abs: 0.5 10*3/uL — ABNORMAL LOW (ref 0.9–3.3)
MCH: 37.3 pg — ABNORMAL HIGH (ref 27.2–33.4)
MCHC: 33.1 g/dL (ref 32.0–36.0)
MCV: 112.7 fL — ABNORMAL HIGH (ref 79.3–98.0)
Monocytes Absolute: 0.5 10*3/uL (ref 0.1–0.9)
Monocytes Relative: 12 %
Neutro Abs: 3.1 10*3/uL (ref 1.5–6.5)
Neutrophils Relative %: 75 %
Platelet Count: 227 10*3/uL (ref 140–400)
RBC: 2.68 MIL/uL — ABNORMAL LOW (ref 4.20–5.82)
RDW: 17.6 % — ABNORMAL HIGH (ref 11.0–14.6)
WBC Count: 4.1 10*3/uL (ref 4.0–10.3)

## 2017-12-19 LAB — CMP (CANCER CENTER ONLY)
ALT: 18 U/L (ref 0–55)
AST: 27 U/L (ref 5–34)
Albumin: 3.9 g/dL (ref 3.5–5.0)
Alkaline Phosphatase: 102 U/L (ref 40–150)
Anion gap: 9 (ref 3–11)
BUN: 16 mg/dL (ref 7–26)
CO2: 30 mmol/L — ABNORMAL HIGH (ref 22–29)
Calcium: 9.6 mg/dL (ref 8.4–10.4)
Chloride: 103 mmol/L (ref 98–109)
Creatinine: 0.89 mg/dL (ref 0.70–1.30)
GFR, Est AFR Am: >60 mL/min (ref >60)
GFR, Estimated: >60 mL/min (ref >60)
Glucose, Bld: 146 mg/dL — ABNORMAL HIGH (ref 70–140)
Potassium: 4.4 mmol/L (ref 3.5–5.1)
Sodium: 142 mmol/L (ref 136–145)
Total Bilirubin: 0.4 mg/dL (ref 0.2–1.2)
Total Protein: 6.8 g/dL (ref 6.4–8.3)

## 2017-12-19 LAB — TSH: TSH: 0.222 u[IU]/mL — ABNORMAL LOW (ref 0.320–4.118)

## 2017-12-19 MED ORDER — PROCHLORPERAZINE MALEATE 10 MG PO TABS
ORAL_TABLET | ORAL | Status: AC
  Filled 2017-12-19: qty 1

## 2017-12-19 MED ORDER — SODIUM CHLORIDE 0.9% FLUSH
10.0000 mL | INTRAVENOUS | Status: DC | PRN
  Administered 2017-12-19: 10 mL via INTRAVENOUS
  Filled 2017-12-19: qty 10

## 2017-12-19 MED ORDER — CYANOCOBALAMIN 1000 MCG/ML IJ SOLN
1000.0000 ug | Freq: Once | INTRAMUSCULAR | Status: AC
  Administered 2017-12-19: 1000 ug via INTRAMUSCULAR

## 2017-12-19 MED ORDER — PROCHLORPERAZINE MALEATE 10 MG PO TABS
10.0000 mg | ORAL_TABLET | Freq: Once | ORAL | Status: AC
  Administered 2017-12-19: 10 mg via ORAL

## 2017-12-19 MED ORDER — SODIUM CHLORIDE 0.9% FLUSH
10.0000 mL | INTRAVENOUS | Status: DC | PRN
  Administered 2017-12-19: 10 mL
  Filled 2017-12-19: qty 10

## 2017-12-19 MED ORDER — HEPARIN SOD (PORK) LOCK FLUSH 100 UNIT/ML IV SOLN
500.0000 [IU] | Freq: Once | INTRAVENOUS | Status: AC | PRN
  Administered 2017-12-19: 500 [IU]
  Filled 2017-12-19: qty 5

## 2017-12-19 MED ORDER — SODIUM CHLORIDE 0.9 % IV SOLN
200.0000 mg | Freq: Once | INTRAVENOUS | Status: AC
  Administered 2017-12-19: 200 mg via INTRAVENOUS
  Filled 2017-12-19: qty 8

## 2017-12-19 MED ORDER — SODIUM CHLORIDE 0.9 % IV SOLN
Freq: Once | INTRAVENOUS | Status: AC
  Administered 2017-12-19: 12:00:00 via INTRAVENOUS

## 2017-12-19 MED ORDER — SODIUM CHLORIDE 0.9 % IV SOLN
500.0000 mg/m2 | Freq: Once | INTRAVENOUS | Status: AC
  Administered 2017-12-19: 1100 mg via INTRAVENOUS
  Filled 2017-12-19: qty 40

## 2017-12-19 MED ORDER — CYANOCOBALAMIN 1000 MCG/ML IJ SOLN
INTRAMUSCULAR | Status: AC
  Filled 2017-12-19: qty 1

## 2017-12-19 NOTE — Telephone Encounter (Signed)
Scheduled appt per 6/5 los - patient to get an updated schedule next visit.

## 2017-12-19 NOTE — Progress Notes (Signed)
[facility] [phone]   [phone]  OFFICE PROGRESS NOTE  [name], MD [place] [address]  DIAGNOSIS: recurrent non-small cell lung cancer initially diagnosed as Stage IIIB (T1b, N3, M0) non-small cell lung cancer favoring adenocarcinoma presented with left lower lobe pulmonary nodule in addition to left hilar and bilateral mediastinal lymphadenopathy in addition to right supraclavicular lymph nodes diagnosed in April 2017.  PRIOR THERAPY: 1) Concurrent chemoradiation with weekly carboplatin for AUC of 2 and paclitaxel 45 mg/M2 started on 11/15/2015. He is status post 6 cycles. Last dose was getting 12/20/2015 with partial response. 2) Consolidation chemotherapy with carboplatin for AUC of 5 and paclitaxel 175 MG/M2 every 3 weeks with Neulasta support. Status post 3 cycles. Last dose was given 04/03/2016.  CURRENT THERAPY: systemic chemotherapy with carboplatin for AUC of 5, Alimta 500 MG/M2 and Ketruda 200 MG IV every 3 weeks. First dose 05/16/2017.  Status post 10 cycle.  Starting from cycle #5 the patient is treated with maintenance Alimta and Keytruda only.  INTERVAL HISTORY: Brian White 67 y.o. male returns to the clinic today for follow-up visit accompanied by his wife.  The patient is feeling fine today with no concerning complaints except for itching and mild skin rash on the neck and upper extremities.  He denied having any chest pain, shortness of breath, cough or hemoptysis.  He denied having any weight loss or night sweats.  He has no nausea, vomiting, diarrhea or constipation.  He denied having any significant weight loss or night sweats.  He has no headache or visual changes.  Has been tolerating his treatment with maintenance Alimta and Keytruda fairly well.  He had repeat CT scan of the chest, abdomen and pelvis performed recently.  He is here for evaluation before starting cycle #11.  MEDICAL  HISTORY: Past Medical History:  Diagnosis Date  . Adenocarcinoma, lung ([facility]) 10/08/2015   2.4cm spiculated LLL nodule suspicious for primary bronchogenic carcinoma by CT 09/2015 - adenocarcinoma by endobronchial biopsy  . Allergy   . Arthritis   . CAD (coronary artery disease)    mild MI; pt unaware - seen on stress test  . COPD (chronic obstructive pulmonary disease) ([place]) 06/26/2011   Mild centrilobular and paraseptal emphysema by CT 09/2015   . ED (erectile dysfunction)   . H/O anaphylactic shock 2013   hives,hypotension,syncope  . H/O seasonal allergies   . Herpes zoster 12/06/2015  . Hypertension   . Polycythemia 01/25/2014  . Prostate cancer [facility]) 2013   s/p radiation therapy March 2013, planned f/u with uro after XRT  . Radiation 09/18/2011 through 11/10/2011   Prostate 7600 cGy 40 sessions, seminal vesicles 5600 cGy 40 sessions    . Radiation 11/10/15-12/22/15   chest 60 Gy  . Shortness of breath dyspnea   . Smoker   . Thoracic aorta atherosclerosis ([place]) 10/08/2015   Mild by CT 09/2015     ALLERGIES:  is allergic to onion.  MEDICATIONS:  Current Outpatient Medications  Medication Sig Dispense Refill  . aspirin 81 MG tablet Take 81 mg by mouth daily. Reported on 12/21/2015    . budesonide-formoterol (SYMBICORT) 160-4.5 MCG/ACT inhaler Inhale 1 puff into the lungs 2 (two) times daily. 3 Inhaler 3  . cetirizine (ZYRTEC) 10 MG chewable tablet Chew 10 mg by mouth daily.    Marland Kitchen dexamethasone (DECADRON) 4 MG tablet 4 mg by mouth twice a day the day before, day of and after  the chemotherapy every 3 weeks. 40 tablet 0  . diphenhydrAMINE (BENADRYL) 25 MG tablet Take 25 mg by mouth at bedtime as needed for sleep.    . folic acid (FOLVITE) 1 MG tablet TAKE 1 TABLET(1 MG) BY MOUTH DAILY 30 tablet 0  . guaiFENesin (MUCINEX) 600 MG 12 hr tablet Take by mouth 2 (two) times daily.    Marland Kitchen ibuprofen (ADVIL,MOTRIN) 200 MG tablet Take 600 mg by mouth every 6 (six) hours as needed  (Pain). Reported on 12/21/2015    . lidocaine-prilocaine (EMLA) cream Apply 1 application topically as needed. Squeeze a  small amount of cream to cotton ball and apply to port site 1-2 hours prior to chemotherapy. Do not rub in the cream and cover  with plastic wrap. 30 g 0  . losartan-hydrochlorothiazide (HYZAAR) 100-25 MG tablet Take 1 tablet daily by mouth. 90 tablet 3  . prochlorperazine (COMPAZINE) 10 MG tablet Take 1 tablet (10 mg total) by mouth every 6 (six) hours as needed for nausea or vomiting. 30 tablet 0   No current facility-administered medications for this visit.     SURGICAL HISTORY:  Past Surgical History:  Procedure Laterality Date  . COLONOSCOPY W/ BIOPSIES AND POLYPECTOMY  10/11/15   mult TAs, 3cm rectal polypoid lesion; rec further treatment, diverticulosis ([place])  . ENDOBRONCHIAL ULTRASOUND Bilateral 10/18/2015   Procedure: ENDOBRONCHIAL ULTRASOUND;  Surgeon: Javier Glazier, MD;  Location: [facility] ENDOSCOPY;  Service: Cardiopulmonary;  Laterality: Bilateral;  . HERNIA REPAIR Right 2005   with mesh  . INSERTION PROSTATE RADIATION SEED  08/2011   prostate cancer, [facility]  . IR FLUORO GUIDE PORT INSERTION RIGHT  07/26/2017  . IR US GUIDE VASC ACCESS RIGHT  07/26/2017    REVIEW OF SYSTEMS:  Constitutional: negative Eyes: negative Ears, nose, mouth, throat, and face: negative Respiratory: negative Cardiovascular: negative Gastrointestinal: negative Genitourinary:negative Integument/breast: positive for pruritus and rash Hematologic/lymphatic: negative Musculoskeletal:negative Neurological: negative Behavioral/Psych: negative Endocrine: negative Allergic/Immunologic: negative   PHYSICAL EXAMINATION: General appearance: alert, cooperative and no distress Head: Normocephalic, without obvious abnormality, atraumatic Neck: no adenopathy, no JVD, supple, symmetrical, trachea midline and thyroid not enlarged, symmetric, no tenderness/mass/nodules Lymph nodes:  Cervical, supraclavicular, and axillary nodes normal. Resp: clear to auscultation bilaterally Back: symmetric, no curvature. ROM normal. No CVA tenderness. Cardio: regular rate and rhythm, S1, S2 normal, no murmur, click, rub or gallop GI: soft, non-tender; bowel sounds normal; no masses,  no organomegaly Extremities: extremities normal, atraumatic, no cyanosis or edema Neurologic: Alert and oriented X 3, normal strength and tone. Normal symmetric reflexes. Normal coordination and gait  ECOG PERFORMANCE STATUS: 1 - Symptomatic but completely ambulatory  Blood pressure 109/63, pulse 75, temperature 99 F (37.2 C), temperature source Oral, resp. rate 18, height 5\' 11"  (1.803 m), weight 211 lb 1.6 oz (95.8 kg), SpO2 99 %.  LABORATORY DATA: Lab Results  Component Value Date   WBC 4.1 12/19/2017   HGB 10.0 (L) 12/19/2017   HCT 30.2 (L) 12/19/2017   MCV 112.7 (H) 12/19/2017   PLT 227 12/19/2017      Chemistry      Component Value Date/Time   NA 142 12/19/2017 1006   NA 138 07/18/2017 1000   K 4.4 12/19/2017 1006   K 4.0 07/18/2017 1000   CL 103 12/19/2017 1006   CO2 30 (H) 12/19/2017 1006   CO2 25 07/18/2017 1000   BUN 16 12/19/2017 1006   BUN 14.9 07/18/2017 1000   CREATININE 0.89 12/19/2017 1006   CREATININE 0.8  07/18/2017 1000      Component Value Date/Time   CALCIUM 9.6 12/19/2017 1006   CALCIUM 9.7 07/18/2017 1000   ALKPHOS 102 12/19/2017 1006   ALKPHOS 93 07/18/2017 1000   AST 27 12/19/2017 1006   AST 22 07/18/2017 1000   ALT 18 12/19/2017 1006   ALT 24 07/18/2017 1000   BILITOT 0.4 12/19/2017 1006   BILITOT 0.54 07/18/2017 1000       RADIOGRAPHIC STUDIES: Ct Chest W Contrast  Result Date: 12/17/2017 CLINICAL DATA:  Restaging non-small cell lung cancer diagnosed in March 2017. Undergoing chemotherapy and immunotherapy. EXAM: CT CHEST WITH CONTRAST TECHNIQUE: Multidetector CT imaging of the chest was performed during intravenous contrast administration. CONTRAST:   10mL OMNIPAQUE IOHEXOL 300 MG/ML  SOLN COMPARISON:  CT 10/16/2017 and 07/16/2017. FINDINGS: Cardiovascular: There is atherosclerosis of aorta, great vessels and coronary arteries. No acute vascular findings are demonstrated. The heart size is normal. There is chronic linear calcification within the inferolateral wall of the left ventricle consistent with an old infarct. There is additional intraluminal calcification in the left ventricle which appears chronic, likely in the papillary muscle. Aortic valvular calcifications are noted. Mediastinum/Nodes: 11 mm right paratracheal node on image 44/2 is similar to the previous study. Small distal paraesophageal nodule is stable (image 115/2). No other enlarged mediastinal or axillary lymph nodes are present. Left infrahilar node or central pulmonary nodule measures 19 x 16 mm on image 87/2, also similar to the previous study. The thyroid gland, trachea and esophagus demonstrate no significant findings. Lungs/Pleura: There is no pleural effusion. As above, stable left infrahilar note or central pulmonary nodule. There is stable mild scarring in the left lower lobe and right upper lobe. No new or enlarging pulmonary nodules. Mild underlying emphysema and central airway thickening. Upper abdomen: Prominent lymph nodes in the gastrohepatic ligament are similar to the previous study. Cholelithiasis. No adrenal mass or other abnormality seen. Musculoskeletal/Chest wall: There is no chest wall mass or suspicious osseous finding. IMPRESSION: 1. Overall stable examination without evidence of disease progression. Right paratracheal and upper abdominal adenopathy and the left infrahilar nodule are unchanged. 2. No acute chest findings. 3. Aortic Atherosclerosis (ICD10-I70.0). Sequela of remote left ventricular myocardial infarction with subendocardial calcification. Electronically Signed   By: Richardean Sale M.D.   On: 12/17/2017 09:24    ASSESSMENT AND PLAN:  This is a very  pleasant 67 years old white male with recurrent non-small cell lung cancer initially diagnosed as stage IIIB non-small cell lung cancer favoring adenocarcinoma status post concurrent chemoradiation followed by consolidation chemotherapy. His last treatment was in September 2017. Restaging scan showed evidence for disease progression and the patient was a started on systemic chemotherapy with carboplatin, Alimta and Keytruda for 4 cycles.  His repeat imaging studies showed improvement of his disease.  He is currently undergoing maintenance treatment with Alimta and Keytruda status post 6 cycles. The patient continues to tolerate his treatment well with no concerning complaints except for the itching and mild skin rash. He had a repeat CT scan of the chest, abdomen and pelvis performed recently.  I personally and independently reviewed the scans and discussed the results with the patient and his wife.  His a scan showed no concerning findings for disease progression.  I recommended for the patient to continue his current treatment with Alimta and Ketruda (pembrolizumab) and he will proceed with cycle #7 today. For the itching and skin rash, I recommended for him to apply hydrocortisone cream and use Benadryl  orally. The patient will come back for follow-up visit in 3 weeks for evaluation before the next cycle of his treatment. He was advised to call immediately if he has any concerning symptoms in the interval. The patient voices understanding of current disease status and treatment options and is in agreement with the current care plan. All questions were answered. The patient knows to call the clinic with any problems, questions or concerns. We can certainly see the patient much sooner if necessary.  Disclaimer: This note was dictated with voice recognition software. Similar sounding words can inadvertently be transcribed and may not be corrected upon review.

## 2017-12-25 ENCOUNTER — Other Ambulatory Visit: Payer: Self-pay | Admitting: Internal Medicine

## 2017-12-25 DIAGNOSIS — Z7189 Other specified counseling: Secondary | ICD-10-CM

## 2017-12-25 DIAGNOSIS — Z23 Encounter for immunization: Secondary | ICD-10-CM

## 2017-12-25 DIAGNOSIS — C3432 Malignant neoplasm of lower lobe, left bronchus or lung: Secondary | ICD-10-CM

## 2017-12-25 DIAGNOSIS — Z5112 Encounter for antineoplastic immunotherapy: Secondary | ICD-10-CM

## 2017-12-25 DIAGNOSIS — R5382 Chronic fatigue, unspecified: Secondary | ICD-10-CM

## 2018-01-07 ENCOUNTER — Other Ambulatory Visit: Payer: Self-pay | Admitting: Oncology

## 2018-01-07 DIAGNOSIS — Z7189 Other specified counseling: Secondary | ICD-10-CM

## 2018-01-07 DIAGNOSIS — C3432 Malignant neoplasm of lower lobe, left bronchus or lung: Secondary | ICD-10-CM

## 2018-01-07 DIAGNOSIS — R5382 Chronic fatigue, unspecified: Secondary | ICD-10-CM

## 2018-01-07 DIAGNOSIS — Z5112 Encounter for antineoplastic immunotherapy: Secondary | ICD-10-CM

## 2018-01-09 ENCOUNTER — Other Ambulatory Visit: Payer: Self-pay | Admitting: Medical Oncology

## 2018-01-09 ENCOUNTER — Telehealth: Payer: Self-pay | Admitting: Internal Medicine

## 2018-01-09 ENCOUNTER — Inpatient Hospital Stay: Payer: BLUE CROSS/BLUE SHIELD

## 2018-01-09 ENCOUNTER — Other Ambulatory Visit: Payer: Self-pay | Admitting: Internal Medicine

## 2018-01-09 ENCOUNTER — Inpatient Hospital Stay (HOSPITAL_BASED_OUTPATIENT_CLINIC_OR_DEPARTMENT_OTHER): Payer: BLUE CROSS/BLUE SHIELD | Admitting: Internal Medicine

## 2018-01-09 ENCOUNTER — Encounter: Payer: Self-pay | Admitting: Internal Medicine

## 2018-01-09 VITALS — BP 128/74 | HR 65 | Temp 98.2°F | Resp 17 | Ht 71.0 in | Wt 206.3 lb

## 2018-01-09 DIAGNOSIS — J449 Chronic obstructive pulmonary disease, unspecified: Secondary | ICD-10-CM

## 2018-01-09 DIAGNOSIS — C3432 Malignant neoplasm of lower lobe, left bronchus or lung: Secondary | ICD-10-CM | POA: Diagnosis not present

## 2018-01-09 DIAGNOSIS — H04209 Unspecified epiphora, unspecified lacrimal gland: Secondary | ICD-10-CM | POA: Diagnosis not present

## 2018-01-09 DIAGNOSIS — Z5112 Encounter for antineoplastic immunotherapy: Secondary | ICD-10-CM

## 2018-01-09 DIAGNOSIS — R5382 Chronic fatigue, unspecified: Secondary | ICD-10-CM

## 2018-01-09 DIAGNOSIS — Z23 Encounter for immunization: Secondary | ICD-10-CM

## 2018-01-09 DIAGNOSIS — Z7189 Other specified counseling: Secondary | ICD-10-CM

## 2018-01-09 DIAGNOSIS — Z5111 Encounter for antineoplastic chemotherapy: Secondary | ICD-10-CM

## 2018-01-09 LAB — CMP (CANCER CENTER ONLY)
ALT: 23 U/L (ref 0–44)
AST: 23 U/L (ref 15–41)
Albumin: 4 g/dL (ref 3.5–5.0)
Alkaline Phosphatase: 96 U/L (ref 38–126)
Anion gap: 10 (ref 5–15)
BUN: 16 mg/dL (ref 8–23)
CO2: 28 mmol/L (ref 22–32)
Calcium: 10.2 mg/dL (ref 8.9–10.3)
Chloride: 104 mmol/L (ref 98–111)
Creatinine: 0.77 mg/dL (ref 0.61–1.24)
GFR, Est AFR Am: >60 mL/min (ref >60)
GFR, Estimated: >60 mL/min (ref >60)
Glucose, Bld: 125 mg/dL — ABNORMAL HIGH (ref 70–99)
Potassium: 4 mmol/L (ref 3.5–5.1)
Sodium: 142 mmol/L (ref 135–145)
Total Bilirubin: 0.4 mg/dL (ref 0.3–1.2)
Total Protein: 6.9 g/dL (ref 6.5–8.1)

## 2018-01-09 LAB — CBC WITH DIFFERENTIAL (CANCER CENTER ONLY)
Basophils Absolute: 0 10*3/uL (ref 0.0–0.1)
Basophils Relative: 0 %
Eosinophils Absolute: 0 10*3/uL (ref 0.0–0.5)
Eosinophils Relative: 0 %
HCT: 30.4 % — ABNORMAL LOW (ref 38.4–49.9)
Hemoglobin: 10.2 g/dL — ABNORMAL LOW (ref 13.0–17.1)
Lymphocytes Relative: 12 %
Lymphs Abs: 0.4 10*3/uL — ABNORMAL LOW (ref 0.9–3.3)
MCH: 37.9 pg — ABNORMAL HIGH (ref 27.2–33.4)
MCHC: 33.6 g/dL (ref 32.0–36.0)
MCV: 112.9 fL — ABNORMAL HIGH (ref 79.3–98.0)
Monocytes Absolute: 0.4 10*3/uL (ref 0.1–0.9)
Monocytes Relative: 11 %
Neutro Abs: 2.9 10*3/uL (ref 1.5–6.5)
Neutrophils Relative %: 77 %
Platelet Count: 257 10*3/uL (ref 140–400)
RBC: 2.69 MIL/uL — ABNORMAL LOW (ref 4.20–5.82)
RDW: 18.4 % — ABNORMAL HIGH (ref 11.0–14.6)
WBC Count: 3.7 10*3/uL — ABNORMAL LOW (ref 4.0–10.3)

## 2018-01-09 LAB — TSH: TSH: 0.15 u[IU]/mL — ABNORMAL LOW (ref 0.320–4.118)

## 2018-01-09 MED ORDER — SODIUM CHLORIDE 0.9 % IV SOLN
200.0000 mg | Freq: Once | INTRAVENOUS | Status: AC
  Administered 2018-01-09: 200 mg via INTRAVENOUS
  Filled 2018-01-09: qty 8

## 2018-01-09 MED ORDER — SODIUM CHLORIDE 0.9% FLUSH
10.0000 mL | INTRAVENOUS | Status: DC | PRN
  Administered 2018-01-09: 10 mL via INTRAVENOUS
  Filled 2018-01-09: qty 10

## 2018-01-09 MED ORDER — PEMETREXED DISODIUM CHEMO INJECTION 500 MG
500.0000 mg/m2 | Freq: Once | INTRAVENOUS | Status: AC
  Administered 2018-01-09: 1100 mg via INTRAVENOUS
  Filled 2018-01-09: qty 40

## 2018-01-09 MED ORDER — PROCHLORPERAZINE MALEATE 10 MG PO TABS
ORAL_TABLET | ORAL | Status: AC
  Filled 2018-01-09: qty 1

## 2018-01-09 MED ORDER — PROCHLORPERAZINE MALEATE 10 MG PO TABS
10.0000 mg | ORAL_TABLET | Freq: Once | ORAL | Status: AC
  Administered 2018-01-09: 10 mg via ORAL

## 2018-01-09 MED ORDER — SODIUM CHLORIDE 0.9% FLUSH
10.0000 mL | INTRAVENOUS | Status: DC | PRN
  Administered 2018-01-09: 10 mL
  Filled 2018-01-09: qty 10

## 2018-01-09 MED ORDER — SODIUM CHLORIDE 0.9 % IV SOLN
Freq: Once | INTRAVENOUS | Status: AC
  Administered 2018-01-09: 11:00:00 via INTRAVENOUS

## 2018-01-09 MED ORDER — HEPARIN SOD (PORK) LOCK FLUSH 100 UNIT/ML IV SOLN
500.0000 [IU] | Freq: Once | INTRAVENOUS | Status: AC | PRN
  Administered 2018-01-09: 500 [IU]
  Filled 2018-01-09: qty 5

## 2018-01-09 MED ORDER — DEXAMETHASONE 4 MG PO TABS: ORAL_TABLET | ORAL | 0 refills | Status: DC

## 2018-01-09 NOTE — Telephone Encounter (Signed)
3 cycles already scheduled per 6/26 los.

## 2018-01-09 NOTE — Patient Instructions (Signed)
[facility] Discharge Instructions for Patients Receiving Chemotherapy  Today you received the following chemotherapy agents Keytruda and Alimta  To help prevent nausea and vomiting after your treatment, we encourage you to take your nausea medication as directed   If you develop nausea and vomiting that is not controlled by your nausea medication, call the clinic.   BELOW ARE SYMPTOMS THAT SHOULD BE REPORTED IMMEDIATELY:  *FEVER GREATER THAN 100.5 F  *CHILLS WITH OR WITHOUT FEVER  NAUSEA AND VOMITING THAT IS NOT CONTROLLED WITH YOUR NAUSEA MEDICATION  *UNUSUAL SHORTNESS OF BREATH  *UNUSUAL BRUISING OR BLEEDING  TENDERNESS IN MOUTH AND THROAT WITH OR WITHOUT PRESENCE OF ULCERS  *URINARY PROBLEMS  *BOWEL PROBLEMS  UNUSUAL RASH Items with * indicate a potential emergency and should be followed up as soon as possible.  Feel free to call the clinic should you have any questions or concerns. The clinic phone number is [phone].  Please show the [facility] at check-in to the Emergency Department and triage nurse.

## 2018-01-09 NOTE — Progress Notes (Signed)
[facility] [phone]   [phone]  OFFICE PROGRESS NOTE  [name], MD [place] [address]  DIAGNOSIS: recurrent non-small cell lung cancer initially diagnosed as Stage IIIB (T1b, N3, M0) non-small cell lung cancer favoring adenocarcinoma presented with left lower lobe pulmonary nodule in addition to left hilar and bilateral mediastinal lymphadenopathy in addition to right supraclavicular lymph nodes diagnosed in April 2017.  PRIOR THERAPY: 1) Concurrent chemoradiation with weekly carboplatin for AUC of 2 and paclitaxel 45 mg/M2 started on 11/15/2015. He is status post 6 cycles. Last dose was getting 12/20/2015 with partial response. 2) Consolidation chemotherapy with carboplatin for AUC of 5 and paclitaxel 175 MG/M2 every 3 weeks with Neulasta support. Status post 3 cycles. Last dose was given 04/03/2016.  CURRENT THERAPY: systemic chemotherapy with carboplatin for AUC of 5, Alimta 500 MG/M2 and Ketruda 200 MG IV every 3 weeks. First dose 05/16/2017.  Status post 11 cycle.  Starting from cycle #5 the patient is treated with maintenance Alimta and Keytruda only.  INTERVAL HISTORY: Brian White 67 y.o. male returns to the clinic today for follow-up visit accompanied by his wife.  The patient is feeling fine today with no specific complaints.  He continues to tolerate his treatment with Alimta and Keytruda fairly well.  He denied having any chest pain, shortness of breath, cough or hemoptysis.  He continues to have watery eye from his treatment with Alimta.  He has no nausea, vomiting, diarrhea or constipation.  He is here today for evaluation before starting cycle #12.   MEDICAL HISTORY: Past Medical History:  Diagnosis Date  . Adenocarcinoma, lung ([place]) 10/08/2015   2.4cm spiculated LLL nodule suspicious for primary bronchogenic carcinoma by CT 09/2015 - adenocarcinoma by endobronchial biopsy  . Allergy   . Arthritis    . CAD (coronary artery disease)    mild MI; pt unaware - seen on stress test  . COPD (chronic obstructive pulmonary disease) ([place]) 06/26/2011   Mild centrilobular and paraseptal emphysema by CT 09/2015   . ED (erectile dysfunction)   . H/O anaphylactic shock 2013   hives,hypotension,syncope  . H/O seasonal allergies   . Herpes zoster 12/06/2015  . Hypertension   . Polycythemia 01/25/2014  . Prostate cancer [facility]) 2013   s/p radiation therapy March 2013, planned f/u with uro after XRT  . Radiation 09/18/2011 through 11/10/2011   Prostate 7600 cGy 40 sessions, seminal vesicles 5600 cGy 40 sessions    . Radiation 11/10/15-12/22/15   chest 60 Gy  . Shortness of breath dyspnea   . Smoker   . Thoracic aorta atherosclerosis ([place]) 10/08/2015   Mild by CT 09/2015     ALLERGIES:  is allergic to onion.  MEDICATIONS:  Current Outpatient Medications  Medication Sig Dispense Refill  . aspirin 81 MG tablet Take 81 mg by mouth daily. Reported on 12/21/2015    . budesonide-formoterol (SYMBICORT) 160-4.5 MCG/ACT inhaler Inhale 1 puff into the lungs 2 (two) times daily. 3 Inhaler 3  . cetirizine (ZYRTEC) 10 MG chewable tablet Chew 10 mg by mouth daily.    Marland Kitchen dexamethasone (DECADRON) 4 MG tablet 4 mg by mouth twice a day the day before, day of and after the chemotherapy every 3 weeks. 40 tablet 0  . diphenhydrAMINE (BENADRYL) 25 MG tablet Take 25 mg by mouth at bedtime as needed for sleep.    . folic acid (FOLVITE) 1 MG tablet TAKE 1 TABLET(1 MG) BY MOUTH  DAILY 30 tablet 0  . guaiFENesin (MUCINEX) 600 MG 12 hr tablet Take by mouth 2 (two) times daily.    Marland Kitchen ibuprofen (ADVIL,MOTRIN) 200 MG tablet Take 600 mg by mouth every 6 (six) hours as needed (Pain). Reported on 12/21/2015    . lidocaine-prilocaine (EMLA) cream Apply 1 application topically as needed. Squeeze a  small amount of cream to cotton ball and apply to port site 1-2 hours prior to chemotherapy. Do not rub in the cream and cover   with plastic wrap. 30 g 0  . losartan-hydrochlorothiazide (HYZAAR) 100-25 MG tablet Take 1 tablet daily by mouth. 90 tablet 3  . prochlorperazine (COMPAZINE) 10 MG tablet Take 1 tablet (10 mg total) by mouth every 6 (six) hours as needed for nausea or vomiting. 30 tablet 0   No current facility-administered medications for this visit.     SURGICAL HISTORY:  Past Surgical History:  Procedure Laterality Date  . COLONOSCOPY W/ BIOPSIES AND POLYPECTOMY  10/11/15   mult TAs, 3cm rectal polypoid lesion; rec further treatment, diverticulosis ([place])  . ENDOBRONCHIAL ULTRASOUND Bilateral 10/18/2015   Procedure: ENDOBRONCHIAL ULTRASOUND;  Surgeon: Javier Glazier, MD;  Location: [facility];  Service: Cardiopulmonary;  Laterality: Bilateral;  . HERNIA REPAIR Right 2005   with mesh  . INSERTION PROSTATE RADIATION SEED  08/2011   prostate cancer, [place]  . IR FLUORO GUIDE PORT INSERTION RIGHT  07/26/2017  . IR US GUIDE VASC ACCESS RIGHT  07/26/2017    REVIEW OF SYSTEMS:  A comprehensive review of systems was negative except for: Constitutional: positive for fatigue Eyes: positive for Lacrimation   PHYSICAL EXAMINATION: General appearance: alert, cooperative and no distress Head: Normocephalic, without obvious abnormality, atraumatic Neck: no adenopathy, no JVD, supple, symmetrical, trachea midline and thyroid not enlarged, symmetric, no tenderness/mass/nodules Lymph nodes: Cervical, supraclavicular, and axillary nodes normal. Resp: clear to auscultation bilaterally Back: symmetric, no curvature. ROM normal. No CVA tenderness. Cardio: regular rate and rhythm, S1, S2 normal, no murmur, click, rub or gallop GI: soft, non-tender; bowel sounds normal; no masses,  no organomegaly Extremities: extremities normal, atraumatic, no cyanosis or edema  ECOG PERFORMANCE STATUS: 1 - Symptomatic but completely ambulatory  Blood pressure 128/74, pulse 65, temperature 98.2 F (36.8 C), temperature  source Oral, resp. rate 17, height 5\' 11"  (1.803 m), weight 206 lb 4.8 oz (93.6 kg), SpO2 98 %.  LABORATORY DATA: Lab Results  Component Value Date   WBC 3.7 (L) 01/09/2018   HGB 10.2 (L) 01/09/2018   HCT 30.4 (L) 01/09/2018   MCV 112.9 (H) 01/09/2018   PLT 257 01/09/2018      Chemistry      Component Value Date/Time   NA 142 01/09/2018 0921   NA 138 07/18/2017 1000   K 4.0 01/09/2018 0921   K 4.0 07/18/2017 1000   CL 104 01/09/2018 0921   CO2 28 01/09/2018 0921   CO2 25 07/18/2017 1000   BUN 16 01/09/2018 0921   BUN 14.9 07/18/2017 1000   CREATININE 0.77 01/09/2018 0921   CREATININE 0.8 07/18/2017 1000      Component Value Date/Time   CALCIUM 10.2 01/09/2018 0921   CALCIUM 9.7 07/18/2017 1000   ALKPHOS 96 01/09/2018 0921   ALKPHOS 93 07/18/2017 1000   AST 23 01/09/2018 0921   AST 22 07/18/2017 1000   ALT 23 01/09/2018 0921   ALT 24 07/18/2017 1000   BILITOT 0.4 01/09/2018 0921   BILITOT 0.54 07/18/2017 1000       RADIOGRAPHIC  STUDIES: Ct Chest W Contrast  Result Date: 12/17/2017 CLINICAL DATA:  Restaging non-small cell lung cancer diagnosed in March 2017. Undergoing chemotherapy and immunotherapy. EXAM: CT CHEST WITH CONTRAST TECHNIQUE: Multidetector CT imaging of the chest was performed during intravenous contrast administration. CONTRAST:  53mL OMNIPAQUE IOHEXOL 300 MG/ML  SOLN COMPARISON:  CT 10/16/2017 and 07/16/2017. FINDINGS: Cardiovascular: There is atherosclerosis of aorta, great vessels and coronary arteries. No acute vascular findings are demonstrated. The heart size is normal. There is chronic linear calcification within the inferolateral wall of the left ventricle consistent with an old infarct. There is additional intraluminal calcification in the left ventricle which appears chronic, likely in the papillary muscle. Aortic valvular calcifications are noted. Mediastinum/Nodes: 11 mm right paratracheal node on image 44/2 is similar to the previous study. Small  distal paraesophageal nodule is stable (image 115/2). No other enlarged mediastinal or axillary lymph nodes are present. Left infrahilar node or central pulmonary nodule measures 19 x 16 mm on image 87/2, also similar to the previous study. The thyroid gland, trachea and esophagus demonstrate no significant findings. Lungs/Pleura: There is no pleural effusion. As above, stable left infrahilar note or central pulmonary nodule. There is stable mild scarring in the left lower lobe and right upper lobe. No new or enlarging pulmonary nodules. Mild underlying emphysema and central airway thickening. Upper abdomen: Prominent lymph nodes in the gastrohepatic ligament are similar to the previous study. Cholelithiasis. No adrenal mass or other abnormality seen. Musculoskeletal/Chest wall: There is no chest wall mass or suspicious osseous finding. IMPRESSION: 1. Overall stable examination without evidence of disease progression. Right paratracheal and upper abdominal adenopathy and the left infrahilar nodule are unchanged. 2. No acute chest findings. 3. Aortic Atherosclerosis (ICD10-I70.0). Sequela of remote left ventricular myocardial infarction with subendocardial calcification. Electronically Signed   By: Richardean Sale M.D.   On: 12/17/2017 09:24    ASSESSMENT AND PLAN:  This is a very pleasant 67 years old white male with recurrent non-small cell lung cancer initially diagnosed as stage IIIB non-small cell lung cancer favoring adenocarcinoma status post concurrent chemoradiation followed by consolidation chemotherapy. His last treatment was in September 2017. Restaging scan showed evidence for disease progression and the patient was a started on systemic chemotherapy with carboplatin, Alimta and Keytruda for 4 cycles.  His repeat imaging studies showed improvement of his disease.  He is currently undergoing maintenance treatment with Alimta and Keytruda status post 7 cycles. He has been tolerating this treatment  well with no concerning complaints. I recommended for him to proceed with cycle 8 of the maintenance treatment today. I will see him back for follow-up visit in 3 weeks for evaluation before the next cycle of his treatment. For the increased lacrimation from treatment with Alimta, he will use over-the-counter Systane eyedrop. He was advised to call immediately if he has any concerning symptoms in the interval. The patient voices understanding of current disease status and treatment options and is in agreement with the current care plan. All questions were answered. The patient knows to call the clinic with any problems, questions or concerns. We can certainly see the patient much sooner if necessary.  Disclaimer: This note was dictated with voice recognition software. Similar sounding words can inadvertently be transcribed and may not be corrected upon review.

## 2018-01-11 ENCOUNTER — Other Ambulatory Visit: Payer: Self-pay | Admitting: *Deleted

## 2018-01-11 NOTE — Telephone Encounter (Signed)
Refill sent 6/27

## 2018-01-28 ENCOUNTER — Other Ambulatory Visit: Payer: Self-pay | Admitting: Internal Medicine

## 2018-01-28 DIAGNOSIS — Z23 Encounter for immunization: Secondary | ICD-10-CM

## 2018-01-28 DIAGNOSIS — R5382 Chronic fatigue, unspecified: Secondary | ICD-10-CM

## 2018-01-28 DIAGNOSIS — Z5112 Encounter for antineoplastic immunotherapy: Secondary | ICD-10-CM

## 2018-01-28 DIAGNOSIS — Z7189 Other specified counseling: Secondary | ICD-10-CM

## 2018-01-28 DIAGNOSIS — C3432 Malignant neoplasm of lower lobe, left bronchus or lung: Secondary | ICD-10-CM

## 2018-01-30 ENCOUNTER — Encounter: Payer: Self-pay | Admitting: Oncology

## 2018-01-30 ENCOUNTER — Inpatient Hospital Stay: Payer: BLUE CROSS/BLUE SHIELD

## 2018-01-30 ENCOUNTER — Other Ambulatory Visit: Payer: Self-pay

## 2018-01-30 ENCOUNTER — Inpatient Hospital Stay: Payer: BLUE CROSS/BLUE SHIELD | Attending: Internal Medicine | Admitting: Oncology

## 2018-01-30 ENCOUNTER — Telehealth: Payer: Self-pay | Admitting: Oncology

## 2018-01-30 VITALS — BP 124/69 | HR 70 | Temp 98.5°F | Resp 17 | Ht 71.0 in | Wt 207.8 lb

## 2018-01-30 DIAGNOSIS — Z79899 Other long term (current) drug therapy: Secondary | ICD-10-CM | POA: Diagnosis not present

## 2018-01-30 DIAGNOSIS — R21 Rash and other nonspecific skin eruption: Secondary | ICD-10-CM | POA: Diagnosis not present

## 2018-01-30 DIAGNOSIS — Z5111 Encounter for antineoplastic chemotherapy: Secondary | ICD-10-CM

## 2018-01-30 DIAGNOSIS — C3432 Malignant neoplasm of lower lobe, left bronchus or lung: Secondary | ICD-10-CM

## 2018-01-30 DIAGNOSIS — Z5112 Encounter for antineoplastic immunotherapy: Secondary | ICD-10-CM | POA: Diagnosis not present

## 2018-01-30 LAB — CBC WITH DIFFERENTIAL (CANCER CENTER ONLY)
Basophils Absolute: 0 10*3/uL (ref 0.0–0.1)
Basophils Relative: 1 %
Eosinophils Absolute: 0 10*3/uL (ref 0.0–0.5)
Eosinophils Relative: 0 %
HCT: 29.3 % — ABNORMAL LOW (ref 38.4–49.9)
Hemoglobin: 10 g/dL — ABNORMAL LOW (ref 13.0–17.1)
Lymphocytes Relative: 11 %
Lymphs Abs: 0.4 10*3/uL — ABNORMAL LOW (ref 0.9–3.3)
MCH: 38.3 pg — ABNORMAL HIGH (ref 27.2–33.4)
MCHC: 34.2 g/dL (ref 32.0–36.0)
MCV: 112 fL — ABNORMAL HIGH (ref 79.3–98.0)
Monocytes Absolute: 0.4 10*3/uL (ref 0.1–0.9)
Monocytes Relative: 9 %
Neutro Abs: 3.2 10*3/uL (ref 1.5–6.5)
Neutrophils Relative %: 79 %
Platelet Count: 273 10*3/uL (ref 140–400)
RBC: 2.62 MIL/uL — ABNORMAL LOW (ref 4.20–5.82)
RDW: 16.8 % — ABNORMAL HIGH (ref 11.0–14.6)
WBC Count: 4 10*3/uL (ref 4.0–10.3)

## 2018-01-30 LAB — CMP (CANCER CENTER ONLY)
ALT: 25 U/L (ref 0–44)
AST: 26 U/L (ref 15–41)
Albumin: 3.9 g/dL (ref 3.5–5.0)
Alkaline Phosphatase: 107 U/L (ref 38–126)
Anion gap: 7 (ref 5–15)
BUN: 17 mg/dL (ref 8–23)
CO2: 28 mmol/L (ref 22–32)
Calcium: 9.8 mg/dL (ref 8.9–10.3)
Chloride: 102 mmol/L (ref 98–111)
Creatinine: 0.84 mg/dL (ref 0.61–1.24)
GFR, Est AFR Am: >60 mL/min (ref >60)
GFR, Estimated: >60 mL/min (ref >60)
Glucose, Bld: 121 mg/dL — ABNORMAL HIGH (ref 70–99)
Potassium: 4.3 mmol/L (ref 3.5–5.1)
Sodium: 137 mmol/L (ref 135–145)
Total Bilirubin: 0.4 mg/dL (ref 0.3–1.2)
Total Protein: 7.1 g/dL (ref 6.5–8.1)

## 2018-01-30 MED ORDER — HYDROXYZINE HCL 10 MG PO TABS: 10.0000 mg | ORAL_TABLET | Freq: Three times a day (TID) | ORAL | 0 refills | Status: DC | PRN

## 2018-01-30 MED ORDER — HEPARIN SOD (PORK) LOCK FLUSH 100 UNIT/ML IV SOLN
500.0000 [IU] | Freq: Once | INTRAVENOUS | Status: AC | PRN
  Administered 2018-01-30: 500 [IU]
  Filled 2018-01-30: qty 5

## 2018-01-30 MED ORDER — PROCHLORPERAZINE MALEATE 10 MG PO TABS
10.0000 mg | ORAL_TABLET | Freq: Once | ORAL | Status: AC
  Administered 2018-01-30: 10 mg via ORAL

## 2018-01-30 MED ORDER — SODIUM CHLORIDE 0.9 % IV SOLN
Freq: Once | INTRAVENOUS | Status: AC
  Administered 2018-01-30: 12:00:00 via INTRAVENOUS

## 2018-01-30 MED ORDER — SODIUM CHLORIDE 0.9% FLUSH
10.0000 mL | INTRAVENOUS | Status: DC | PRN
  Administered 2018-01-30: 10 mL via INTRAVENOUS
  Filled 2018-01-30: qty 10

## 2018-01-30 MED ORDER — PROCHLORPERAZINE MALEATE 10 MG PO TABS
ORAL_TABLET | ORAL | Status: AC
  Filled 2018-01-30: qty 1

## 2018-01-30 MED ORDER — SODIUM CHLORIDE 0.9 % IV SOLN
500.0000 mg/m2 | Freq: Once | INTRAVENOUS | Status: AC
  Administered 2018-01-30: 1100 mg via INTRAVENOUS
  Filled 2018-01-30: qty 40

## 2018-01-30 MED ORDER — PEMBROLIZUMAB CHEMO INJECTION 100 MG/4ML
200.0000 mg | Freq: Once | INTRAVENOUS | Status: AC
  Administered 2018-01-30: 200 mg via INTRAVENOUS
  Filled 2018-01-30: qty 8

## 2018-01-30 MED ORDER — SODIUM CHLORIDE 0.9% FLUSH
10.0000 mL | INTRAVENOUS | Status: DC | PRN
  Administered 2018-01-30: 10 mL
  Filled 2018-01-30: qty 10

## 2018-01-30 NOTE — Telephone Encounter (Signed)
Scheduled apt per 7/17 los - pt to get an updated schedule next visit

## 2018-01-30 NOTE — Progress Notes (Signed)
Brian White OFFICE PROGRESS NOTE  Brian Bush, MD [place] [address]  DIAGNOSIS: recurrent non-small cell lung cancer initially diagnosed as Stage IIIB (T1b, N3, M0) non-small cell lung cancer favoring adenocarcinoma presented with left lower lobe pulmonary nodule in addition to left hilar and bilateral mediastinal lymphadenopathy in addition to right supraclavicular lymph nodes diagnosed in April 2017.  PRIOR THERAPY: 1) Concurrent chemoradiation with weekly carboplatin for AUC of 2 and paclitaxel 45 mg/M2 started on 11/15/2015. He is status post 6 cycles. Last dose was getting 12/20/2015 with partial response. 2) Consolidation chemotherapy with carboplatin for AUC of 5 and paclitaxel 175 MG/M2 every 3 weeks with Neulasta support. Status post 3cycles. Last dose was given 04/03/2016.  CURRENT THERAPY: systemic chemotherapy with carboplatin for AUC of 5, Alimta 500 MG/M2 and Ketruda 200 MG IV every 3 weeks. First dose 05/16/2017.  Status post 12 cycles.  Starting from cycle #5 the patient is treated with maintenance Alimta and Keytruda only.  INTERVAL HISTORY: Brian White 67 y.o. male returns for routine follow-up visit accompanied by his wife.  The patient is feeling fine today and has no specific complaints except for intermittent rash to his bilateral shoulders.  He reports that he uses hydrocortisone cream and Benadryl at night which makes him sleepy.  He reports that his rash improves on the days surrounding chemotherapy when he is on dexamethasone.  He denies fevers and chills.  Denies chest pain, shortness breath, cough, hemoptysis.  Denies nausea, vomiting, constipation, diarrhea.  Denies recent weight loss or night sweats.  The patient is here for evaluation prior to cycle #13 of his treatment.  MEDICAL HISTORY: Past Medical History:  Diagnosis Date  . Adenocarcinoma, lung ([place]) 10/08/2015   2.4cm spiculated LLL nodule suspicious for  primary bronchogenic carcinoma by CT 09/2015 - adenocarcinoma by endobronchial biopsy  . Allergy   . Arthritis   . CAD (coronary artery disease)    mild MI; pt unaware - seen on stress test  . COPD (chronic obstructive pulmonary disease) ([place]) 06/26/2011   Mild centrilobular and paraseptal emphysema by CT 09/2015   . ED (erectile dysfunction)   . H/O anaphylactic shock 2013   hives,hypotension,syncope  . H/O seasonal allergies   . Herpes zoster 12/06/2015  . Hypertension   . Polycythemia 01/25/2014  . Prostate cancer [facility]) 2013   s/p radiation therapy March 2013, planned f/u with uro after XRT  . Radiation 09/18/2011 through 11/10/2011   Prostate 7600 cGy 40 sessions, seminal vesicles 5600 cGy 40 sessions    . Radiation 11/10/15-12/22/15   chest 60 Gy  . Shortness of breath dyspnea   . Smoker   . Thoracic aorta atherosclerosis (Brian White) 10/08/2015   Mild by CT 09/2015     ALLERGIES:  is allergic to onion.  MEDICATIONS:  Current Outpatient Medications  Medication Sig Dispense Refill  . aspirin 81 MG tablet Take 81 mg by mouth daily. Reported on 12/21/2015    . budesonide-formoterol (SYMBICORT) 160-4.5 MCG/ACT inhaler Inhale 1 puff into the lungs 2 (two) times daily. 3 Inhaler 3  . cetirizine (ZYRTEC) 10 MG chewable tablet Chew 10 mg by mouth daily.    Marland Kitchen dexamethasone (DECADRON) 4 MG tablet 4 mg by mouth twice a day the day before, day of and after the chemotherapy every 3 weeks. 40 tablet 0  . diphenhydrAMINE (BENADRYL) 25 MG tablet Take 25 mg by mouth at bedtime as needed for sleep.    . folic acid (  FOLVITE) 1 MG tablet TAKE 1 TABLET(1 MG) BY MOUTH DAILY 30 tablet 0  . guaiFENesin (MUCINEX) 600 MG 12 hr tablet Take by mouth 2 (two) times daily.    Marland Kitchen ibuprofen (ADVIL,MOTRIN) 200 MG tablet Take 600 mg by mouth every 6 (six) hours as needed (Pain). Reported on 12/21/2015    . lidocaine-prilocaine (EMLA) cream Apply 1 application topically as needed. Squeeze a  small amount of  cream to cotton ball and apply to port site 1-2 hours prior to chemotherapy. Do not rub in the cream and cover  with plastic wrap. 30 g 0  . losartan-hydrochlorothiazide (HYZAAR) 100-25 MG tablet Take 1 tablet daily by mouth. 90 tablet 3  . prochlorperazine (COMPAZINE) 10 MG tablet Take 1 tablet (10 mg total) by mouth every 6 (six) hours as needed for nausea or vomiting. 30 tablet 0  . hydrOXYzine (ATARAX/VISTARIL) 10 MG tablet Take 1 tablet (10 mg total) by mouth 3 (three) times daily as needed. 30 tablet 0   No current facility-administered medications for this visit.    Facility-Administered Medications Ordered in Other Visits  Medication Dose Route Frequency Provider Last Rate Last Dose  . heparin lock flush 100 unit/mL  500 Units Intracatheter Once PRN Brian Bears, MD      . pembrolizumab [facility]) 200 mg in sodium chloride 0.9 % 50 mL chemo infusion  200 mg Intravenous Once Brian Bears, MD      . PEMEtrexed (ALIMTA) 1,100 mg in sodium chloride 0.9 % 100 mL chemo infusion  500 mg/m2 (Treatment Plan Recorded) Intravenous Once Brian Bears, MD      . sodium chloride flush (NS) 0.9 % injection 10 mL  10 mL Intracatheter PRN Brian Bears, MD        SURGICAL HISTORY:  Past Surgical History:  Procedure Laterality Date  . COLONOSCOPY W/ BIOPSIES AND POLYPECTOMY  10/11/15   mult TAs, 3cm rectal polypoid lesion; rec further treatment, diverticulosis (Brian White)  . ENDOBRONCHIAL ULTRASOUND Bilateral 10/18/2015   Procedure: ENDOBRONCHIAL ULTRASOUND;  Surgeon: Brian Glazier, MD;  Location: [facility];  Service: Cardiopulmonary;  Laterality: Bilateral;  . HERNIA REPAIR Right 2005   with mesh  . INSERTION PROSTATE RADIATION SEED  08/2011   prostate cancer, Brian White  . IR FLUORO GUIDE PORT INSERTION RIGHT  07/26/2017  . IR US GUIDE VASC ACCESS RIGHT  07/26/2017    REVIEW OF SYSTEMS:   Review of Systems  Constitutional: Negative for appetite change, chills, fatigue, fever  and unexpected weight change.  HENT:   Negative for mouth sores, nosebleeds, sore throat and trouble swallowing.   Eyes: Negative for eye problems and icterus.  Respiratory: Negative for cough, hemoptysis, shortness of breath and wheezing.   Cardiovascular: Negative for chest pain and leg swelling.  Gastrointestinal: Negative for abdominal pain, constipation, diarrhea, nausea and vomiting.  Genitourinary: Negative for bladder incontinence, difficulty urinating, dysuria, frequency and hematuria.   Musculoskeletal: Negative for back pain, gait problem, neck pain and neck stiffness.  Skin: Positive for intermittent rash to his bilateral shoulders.  Neurological: Negative for dizziness, extremity weakness, gait problem, headaches, light-headedness and seizures.  Hematological: Negative for adenopathy. Does not bruise/bleed easily.  Psychiatric/Behavioral: Negative for confusion, depression and sleep disturbance. The patient is not nervous/anxious.     PHYSICAL EXAMINATION:  Blood pressure 124/69, pulse 70, temperature 98.5 F (36.9 C), temperature source Oral, resp. rate 17, height 5\' 11"  (1.803 m), weight 207 lb 12.8 oz (94.3 kg), SpO2 100 %.  ECOG PERFORMANCE STATUS: 1 -  Symptomatic but completely ambulatory  Physical Exam  Constitutional: Oriented to person, place, and time and well-developed, well-nourished, and in no distress. No distress.  HENT:  Head: Normocephalic and atraumatic.  Mouth/Throat: Oropharynx is clear and moist. No oropharyngeal exudate.  Eyes: Conjunctivae are normal. Right eye exhibits no discharge. Left eye exhibits no discharge. No scleral icterus.  Neck: Normal range of motion. Neck supple.  Cardiovascular: Normal rate, regular rhythm, normal heart sounds and intact distal pulses.   Pulmonary/Chest: Effort normal and breath sounds normal. No respiratory distress. No wheezes. No rales.  Abdominal: Soft. Bowel sounds are normal. Exhibits no distension and no mass.  There is no tenderness.  Musculoskeletal: Normal range of motion. Exhibits no edema.  Lymphadenopathy:    No cervical adenopathy.  Neurological: Alert and oriented to person, place, and time. Exhibits normal muscle tone. Gait normal. Coordination normal.  Skin: Skin is warm and dry. No rash noted. Not diaphoretic. No erythema. No pallor.  Psychiatric: Mood, memory and judgment normal.  Vitals reviewed.  LABORATORY DATA: Lab Results  Component Value Date   WBC 4.0 01/30/2018   HGB 10.0 (L) 01/30/2018   HCT 29.3 (L) 01/30/2018   MCV 112.0 (H) 01/30/2018   PLT 273 01/30/2018      Chemistry      Component Value Date/Time   NA 137 01/30/2018 1014   NA 138 07/18/2017 1000   K 4.3 01/30/2018 1014   K 4.0 07/18/2017 1000   CL 102 01/30/2018 1014   CO2 28 01/30/2018 1014   CO2 25 07/18/2017 1000   BUN 17 01/30/2018 1014   BUN 14.9 07/18/2017 1000   CREATININE 0.84 01/30/2018 1014   CREATININE 0.8 07/18/2017 1000      Component Value Date/Time   CALCIUM 9.8 01/30/2018 1014   CALCIUM 9.7 07/18/2017 1000   ALKPHOS 107 01/30/2018 1014   ALKPHOS 93 07/18/2017 1000   AST 26 01/30/2018 1014   AST 22 07/18/2017 1000   ALT 25 01/30/2018 1014   ALT 24 07/18/2017 1000   BILITOT 0.4 01/30/2018 1014   BILITOT 0.54 07/18/2017 1000       RADIOGRAPHIC STUDIES:  No results found.   ASSESSMENT/PLAN:  Cancer of lower lobe of left lung [facility]) This is a very pleasant 67 year old white male with recurrent non-small cell lung cancer initially diagnosed as stage IIIB non-small cell lung cancer favoring adenocarcinoma status post concurrent chemoradiation followed by consolidation chemotherapy. His last treatment was in September 2017. Restaging scan showed evidence for disease progression and the patient was a started on systemic chemotherapy with carboplatin, Alimta and Keytruda for 4 cycles.  His repeat imaging studies showed improvement of his disease.  He is currently undergoing  maintenance treatment with Alimta and Keytruda status post 8 cycles. He has been tolerating this treatment well with no concerning complaints. I recommended for him to proceed with cycle 9 of the maintenance treatment today. The patient will have a restaging CT scan of the chest prior to his next visit.  He will follow-up in 3 weeks for evaluation prior to cycle #10 of his maintenance treatment.  For the itching and rash to his bilateral shoulders, I have given her prescription for Atarax to try to see if it makes him less sedated than Benadryl.  He was advised not to use both Benadryl and Atarax at the same time.  He was advised to call immediately if he has any concerning symptoms in the interval. The patient voices understanding of current  disease status and treatment options and is in agreement with the current care plan. All questions were answered. The patient knows to call the clinic with any problems, questions or concerns. We can certainly see the patient much sooner if necessary.   Orders Placed This Encounter  Procedures  . CT CHEST W CONTRAST    Standing Status:   Future    Standing Expiration Date:   01/31/2019    Order Specific Question:   If indicated for the ordered procedure, I authorize the administration of contrast media per Radiology protocol    Answer:   Yes    Order Specific Question:   Preferred imaging location?    Answer:   [facility]    Order Specific Question:   Radiology Contrast Protocol - do NOT remove file path    Answer:   [email or web address]    Order Specific Question:   ** REASON FOR EXAM (FREE TEXT)    Answer:   Lung cancer. Restaging.   Mikey Bussing, DNP, AGPCNP-BC, AOCNP 01/30/18

## 2018-01-30 NOTE — Patient Instructions (Signed)
[facility] Discharge Instructions for Patients Receiving Chemotherapy  Today you received the following chemotherapy agents Keytruda and Alimta  To help prevent nausea and vomiting after your treatment, we encourage you to take your nausea medication as directed   If you develop nausea and vomiting that is not controlled by your nausea medication, call the clinic.   BELOW ARE SYMPTOMS THAT SHOULD BE REPORTED IMMEDIATELY:  *FEVER GREATER THAN 100.5 F  *CHILLS WITH OR WITHOUT FEVER  NAUSEA AND VOMITING THAT IS NOT CONTROLLED WITH YOUR NAUSEA MEDICATION  *UNUSUAL SHORTNESS OF BREATH  *UNUSUAL BRUISING OR BLEEDING  TENDERNESS IN MOUTH AND THROAT WITH OR WITHOUT PRESENCE OF ULCERS  *URINARY PROBLEMS  *BOWEL PROBLEMS  UNUSUAL RASH Items with * indicate a potential emergency and should be followed up as soon as possible.  Feel free to call the clinic should you have any questions or concerns. The clinic phone number is [phone].  Please show the [facility] at check-in to the Emergency Department and triage nurse.

## 2018-01-30 NOTE — Assessment & Plan Note (Addendum)
This is a very pleasant 67 year old white male with recurrent non-small cell lung cancer initially diagnosed as stage IIIB non-small cell lung cancer favoring adenocarcinoma status post concurrent chemoradiation followed by consolidation chemotherapy. His last treatment was in September 2017. Restaging scan showed evidence for disease progression and the patient was a started on systemic chemotherapy with carboplatin, Alimta and Keytruda for 4 cycles.  His repeat imaging studies showed improvement of his disease.  He is currently undergoing maintenance treatment with Alimta and Keytruda status post 8 cycles. He has been tolerating this treatment well with no concerning complaints. I recommended for him to proceed with cycle 9 of the maintenance treatment today. The patient will have a restaging CT scan of the chest prior to his next visit.  He will follow-up in 3 weeks for evaluation prior to cycle #10 of his maintenance treatment.  For the itching and rash to his bilateral shoulders, I have given her prescription for Atarax to try to see if it makes him less sedated than Benadryl.  He was advised not to use both Benadryl and Atarax at the same time.  He was advised to call immediately if he has any concerning symptoms in the interval. The patient voices understanding of current disease status and treatment options and is in agreement with the current care plan. All questions were answered. The patient knows to call the clinic with any problems, questions or concerns. We can certainly see the patient much sooner if necessary.

## 2018-02-18 ENCOUNTER — Telehealth: Payer: Self-pay | Admitting: Medical Oncology

## 2018-02-18 NOTE — Telephone Encounter (Signed)
T stated his CT is not scheduled ( expected 8/5) and he sees [place] on wed.

## 2018-02-19 ENCOUNTER — Ambulatory Visit (HOSPITAL_COMMUNITY)
Admission: RE | Admit: 2018-02-19 | Discharge: 2018-02-19 | Disposition: A | Discharge status: Home / Self Care | Payer: BLUE CROSS/BLUE SHIELD | Source: Ambulatory Visit | Attending: Oncology | Admitting: Oncology

## 2018-02-19 ENCOUNTER — Encounter (HOSPITAL_COMMUNITY): Payer: Self-pay

## 2018-02-19 DIAGNOSIS — R590 Localized enlarged lymph nodes: Secondary | ICD-10-CM | POA: Insufficient documentation

## 2018-02-19 DIAGNOSIS — M7989 Other specified soft tissue disorders: Secondary | ICD-10-CM | POA: Diagnosis not present

## 2018-02-19 DIAGNOSIS — I700 Atherosclerosis of aorta: Secondary | ICD-10-CM | POA: Diagnosis not present

## 2018-02-19 DIAGNOSIS — C3432 Malignant neoplasm of lower lobe, left bronchus or lung: Secondary | ICD-10-CM

## 2018-02-19 DIAGNOSIS — I252 Old myocardial infarction: Secondary | ICD-10-CM | POA: Insufficient documentation

## 2018-02-19 MED ORDER — HEPARIN SOD (PORK) LOCK FLUSH 100 UNIT/ML IV SOLN
INTRAVENOUS | Status: AC
  Filled 2018-02-19: qty 5

## 2018-02-19 MED ORDER — HEPARIN SOD (PORK) LOCK FLUSH 100 UNIT/ML IV SOLN
500.0000 [IU] | Freq: Once | INTRAVENOUS | Status: AC
  Administered 2018-02-19: 500 [IU] via INTRAVENOUS

## 2018-02-19 MED ORDER — IOHEXOL 300 MG/ML  SOLN
75.0000 mL | Freq: Once | INTRAMUSCULAR | Status: AC | PRN
  Administered 2018-02-19: 75 mL via INTRAVENOUS

## 2018-02-20 ENCOUNTER — Inpatient Hospital Stay: Payer: BLUE CROSS/BLUE SHIELD | Attending: Internal Medicine | Admitting: Internal Medicine

## 2018-02-20 ENCOUNTER — Inpatient Hospital Stay: Payer: BLUE CROSS/BLUE SHIELD

## 2018-02-20 ENCOUNTER — Encounter: Payer: Self-pay | Admitting: Internal Medicine

## 2018-02-20 ENCOUNTER — Telehealth: Payer: Self-pay | Admitting: Internal Medicine

## 2018-02-20 VITALS — BP 114/56 | HR 66 | Temp 98.2°F | Resp 18 | Ht 71.0 in | Wt 205.8 lb

## 2018-02-20 DIAGNOSIS — H04209 Unspecified epiphora, unspecified lacrimal gland: Secondary | ICD-10-CM | POA: Insufficient documentation

## 2018-02-20 DIAGNOSIS — Z5111 Encounter for antineoplastic chemotherapy: Secondary | ICD-10-CM | POA: Insufficient documentation

## 2018-02-20 DIAGNOSIS — Z5112 Encounter for antineoplastic immunotherapy: Secondary | ICD-10-CM | POA: Diagnosis not present

## 2018-02-20 DIAGNOSIS — C3432 Malignant neoplasm of lower lobe, left bronchus or lung: Secondary | ICD-10-CM

## 2018-02-20 DIAGNOSIS — I10 Essential (primary) hypertension: Secondary | ICD-10-CM

## 2018-02-20 LAB — CBC WITH DIFFERENTIAL (CANCER CENTER ONLY)
Basophils Absolute: 0 10*3/uL (ref 0.0–0.1)
Basophils Relative: 0 %
Eosinophils Absolute: 0 10*3/uL (ref 0.0–0.5)
Eosinophils Relative: 0 %
HCT: 29.5 % — ABNORMAL LOW (ref 38.4–49.9)
Hemoglobin: 9.6 g/dL — ABNORMAL LOW (ref 13.0–17.1)
Lymphocytes Relative: 11 %
Lymphs Abs: 0.5 10*3/uL — ABNORMAL LOW (ref 0.9–3.3)
MCH: 36.8 pg — ABNORMAL HIGH (ref 27.2–33.4)
MCHC: 32.5 g/dL (ref 32.0–36.0)
MCV: 113 fL — ABNORMAL HIGH (ref 79.3–98.0)
Monocytes Absolute: 0.4 10*3/uL (ref 0.1–0.9)
Monocytes Relative: 9 %
Neutro Abs: 3.3 10*3/uL (ref 1.5–6.5)
Neutrophils Relative %: 80 %
Platelet Count: 276 10*3/uL (ref 140–400)
RBC: 2.61 MIL/uL — ABNORMAL LOW (ref 4.20–5.82)
RDW: 15.8 % — ABNORMAL HIGH (ref 11.0–14.6)
WBC Count: 4.2 10*3/uL (ref 4.0–10.3)

## 2018-02-20 LAB — CMP (CANCER CENTER ONLY)
ALT: 16 U/L (ref 0–44)
AST: 18 U/L (ref 15–41)
Albumin: 3.7 g/dL (ref 3.5–5.0)
Alkaline Phosphatase: 97 U/L (ref 38–126)
Anion gap: 11 (ref 5–15)
BUN: 15 mg/dL (ref 8–23)
CO2: 25 mmol/L (ref 22–32)
Calcium: 9.8 mg/dL (ref 8.9–10.3)
Chloride: 105 mmol/L (ref 98–111)
Creatinine: 0.86 mg/dL (ref 0.61–1.24)
GFR, Est AFR Am: >60 mL/min (ref >60)
GFR, Estimated: >60 mL/min (ref >60)
Glucose, Bld: 174 mg/dL — ABNORMAL HIGH (ref 70–99)
Potassium: 3.9 mmol/L (ref 3.5–5.1)
Sodium: 141 mmol/L (ref 135–145)
Total Bilirubin: 0.3 mg/dL (ref 0.3–1.2)
Total Protein: 6.8 g/dL (ref 6.5–8.1)

## 2018-02-20 MED ORDER — PROCHLORPERAZINE MALEATE 10 MG PO TABS
ORAL_TABLET | ORAL | Status: AC
  Filled 2018-02-20: qty 1

## 2018-02-20 MED ORDER — SODIUM CHLORIDE 0.9 % IV SOLN
Freq: Once | INTRAVENOUS | Status: AC
  Administered 2018-02-20: 12:00:00 via INTRAVENOUS
  Filled 2018-02-20: qty 250

## 2018-02-20 MED ORDER — HEPARIN SOD (PORK) LOCK FLUSH 100 UNIT/ML IV SOLN
500.0000 [IU] | Freq: Once | INTRAVENOUS | Status: AC | PRN
  Administered 2018-02-20: 500 [IU]
  Filled 2018-02-20: qty 5

## 2018-02-20 MED ORDER — SODIUM CHLORIDE 0.9 % IV SOLN
200.0000 mg | Freq: Once | INTRAVENOUS | Status: AC
  Administered 2018-02-20: 200 mg via INTRAVENOUS
  Filled 2018-02-20: qty 8

## 2018-02-20 MED ORDER — SODIUM CHLORIDE 0.9 % IV SOLN
500.0000 mg/m2 | Freq: Once | INTRAVENOUS | Status: AC
  Administered 2018-02-20: 1100 mg via INTRAVENOUS
  Filled 2018-02-20: qty 40

## 2018-02-20 MED ORDER — CYANOCOBALAMIN 1000 MCG/ML IJ SOLN
INTRAMUSCULAR | Status: AC
  Filled 2018-02-20: qty 1

## 2018-02-20 MED ORDER — PROCHLORPERAZINE MALEATE 10 MG PO TABS
10.0000 mg | ORAL_TABLET | Freq: Once | ORAL | Status: AC
  Administered 2018-02-20: 10 mg via ORAL

## 2018-02-20 MED ORDER — CYANOCOBALAMIN 1000 MCG/ML IJ SOLN
1000.0000 ug | Freq: Once | INTRAMUSCULAR | Status: AC
  Administered 2018-02-20: 1000 ug via INTRAMUSCULAR

## 2018-02-20 MED ORDER — SODIUM CHLORIDE 0.9% FLUSH
10.0000 mL | INTRAVENOUS | Status: DC | PRN
  Administered 2018-02-20: 10 mL
  Filled 2018-02-20: qty 10

## 2018-02-20 MED ORDER — SODIUM CHLORIDE 0.9% FLUSH
10.0000 mL | INTRAVENOUS | Status: DC | PRN
Start: 2018-02-20 — End: 2018-02-20
  Administered 2018-02-20: 10 mL via INTRAVENOUS
  Filled 2018-02-20: qty 10

## 2018-02-20 NOTE — Progress Notes (Signed)
[facility] [phone]   [phone]  OFFICE PROGRESS NOTE  [name], MD [place] [address]  DIAGNOSIS: recurrent non-small cell lung cancer initially diagnosed as Stage IIIB (T1b, N3, M0) non-small cell lung cancer favoring adenocarcinoma presented with left lower lobe pulmonary nodule in addition to left hilar and bilateral mediastinal lymphadenopathy in addition to right supraclavicular lymph nodes diagnosed in April 2017.  PRIOR THERAPY: 1) Concurrent chemoradiation with weekly carboplatin for AUC of 2 and paclitaxel 45 mg/M2 started on 11/15/2015. He is status post 6 cycles. Last dose was getting 12/20/2015 with partial response. 2) Consolidation chemotherapy with carboplatin for AUC of 5 and paclitaxel 175 MG/M2 every 3 weeks with Neulasta support. Status post 3 cycles. Last dose was given 04/03/2016.  CURRENT THERAPY: systemic chemotherapy with carboplatin for AUC of 5, Alimta 500 MG/M2 and Ketruda 200 MG IV every 3 weeks. First dose 05/16/2017.  Status post 11 cycle.  Starting from cycle #5 the patient is treated with maintenance Alimta and Keytruda only.  INTERVAL HISTORY: Brian White 67 y.o. male returns to the clinic today for follow-up visit accompanied by his wife.  The patient is feeling fine today with no concerning complaints.  He continues to tolerate his maintenance treatment with Alimta and Keytruda fairly well.  He has few areas of skin rash after the treatment but it improves when he takes the Decadron premedication for his chemotherapy.  He denied having any nausea, vomiting, diarrhea or constipation.  He denied having any weight loss or night sweats.  He has no chest pain, shortness breath, cough or hemoptysis.  He continues to have increased tearing with his treatment.  He had repeat CT scan of the chest performed recently and is here for evaluation and discussion of his discuss results.   MEDICAL  HISTORY: Past Medical History:  Diagnosis Date  . Adenocarcinoma, lung ([place]) 10/08/2015   2.4cm spiculated LLL nodule suspicious for primary bronchogenic carcinoma by CT 09/2015 - adenocarcinoma by endobronchial biopsy  . Allergy   . Arthritis   . CAD (coronary artery disease)    mild MI; pt unaware - seen on stress test  . COPD (chronic obstructive pulmonary disease) ([place]) 06/26/2011   Mild centrilobular and paraseptal emphysema by CT 09/2015   . ED (erectile dysfunction)   . H/O anaphylactic shock 2013   hives,hypotension,syncope  . H/O seasonal allergies   . Herpes zoster 12/06/2015  . Hypertension   . Polycythemia 01/25/2014  . Prostate cancer [facility]) 2013   s/p radiation therapy March 2013, planned f/u with uro after XRT  . Radiation 09/18/2011 through 11/10/2011   Prostate 7600 cGy 40 sessions, seminal vesicles 5600 cGy 40 sessions    . Radiation 11/10/15-12/22/15   chest 60 Gy  . Shortness of breath dyspnea   . Smoker   . Thoracic aorta atherosclerosis ([place]) 10/08/2015   Mild by CT 09/2015     ALLERGIES:  is allergic to onion.  MEDICATIONS:  Current Outpatient Medications  Medication Sig Dispense Refill  . aspirin 81 MG tablet Take 81 mg by mouth daily. Reported on 12/21/2015    . budesonide-formoterol (SYMBICORT) 160-4.5 MCG/ACT inhaler Inhale 1 puff into the lungs 2 (two) times daily. 3 Inhaler 3  . cetirizine (ZYRTEC) 10 MG chewable tablet Chew 10 mg by mouth daily.    Marland Kitchen dexamethasone (DECADRON) 4 MG tablet 4 mg by mouth twice a day the day before, day of and after  the chemotherapy every 3 weeks. 40 tablet 0  . diphenhydrAMINE (BENADRYL) 25 MG tablet Take 25 mg by mouth at bedtime as needed for sleep.    . folic acid (FOLVITE) 1 MG tablet TAKE 1 TABLET(1 MG) BY MOUTH DAILY 30 tablet 0  . guaiFENesin (MUCINEX) 600 MG 12 hr tablet Take by mouth 2 (two) times daily.    . hydrOXYzine (ATARAX/VISTARIL) 10 MG tablet Take 1 tablet (10 mg total) by mouth 3 (three)  times daily as needed. 30 tablet 0  . ibuprofen (ADVIL,MOTRIN) 200 MG tablet Take 600 mg by mouth every 6 (six) hours as needed (Pain). Reported on 12/21/2015    . lidocaine-prilocaine (EMLA) cream Apply 1 application topically as needed. Squeeze a  small amount of cream to cotton ball and apply to port site 1-2 hours prior to chemotherapy. Do not rub in the cream and cover  with plastic wrap. 30 g 0  . losartan-hydrochlorothiazide (HYZAAR) 100-25 MG tablet Take 1 tablet daily by mouth. 90 tablet 3  . prochlorperazine (COMPAZINE) 10 MG tablet Take 1 tablet (10 mg total) by mouth every 6 (six) hours as needed for nausea or vomiting. 30 tablet 0   No current facility-administered medications for this visit.     SURGICAL HISTORY:  Past Surgical History:  Procedure Laterality Date  . COLONOSCOPY W/ BIOPSIES AND POLYPECTOMY  10/11/15   mult TAs, 3cm rectal polypoid lesion; rec further treatment, diverticulosis ([place])  . ENDOBRONCHIAL ULTRASOUND Bilateral 10/18/2015   Procedure: ENDOBRONCHIAL ULTRASOUND;  Surgeon: Javier Glazier, MD;  Location: [facility] END[facility]COPY;  Service: Cardiopulmonary;  Laterality: Bilateral;  . HERNIA REPAIR Right 2005   with mesh  . INSERTION PROSTATE RADIATION SEED  08/2011   prostate cancer, [facility]  . IR FLUORO GUIDE PORT INSERTION RIGHT  07/26/2017  . IR US GUIDE VASC ACCESS RIGHT  07/26/2017    REVIEW OF SYSTEMS:  Constitutional: negative Eyes: negative Ears, nose, mouth, throat, and face: negative Respiratory: negative Cardiovascular: negative Gastrointestinal: negative Genitourinary:negative Integument/breast: negative Hematologic/lymphatic: negative Musculoskeletal:negative Neurological: negative Behavioral/Psych: negative Endocrine: negative Allergic/Immunologic: negative   PHYSICAL EXAMINATION: General appearance: alert, cooperative and no distress Head: Normocephalic, without obvious abnormality, atraumatic Neck: no adenopathy, no JVD, supple,  symmetrical, trachea midline and thyroid not enlarged, symmetric, no tenderness/mass/nodules Lymph nodes: Cervical, supraclavicular, and axillary nodes normal. Resp: clear to auscultation bilaterally Back: symmetric, no curvature. ROM normal. No CVA tenderness. Cardio: regular rate and rhythm, S1, S2 normal, no murmur, click, rub or gallop GI: soft, non-tender; bowel sounds normal; no masses,  no organomegaly Extremities: extremities normal, atraumatic, no cyanosis or edema Neurologic: Alert and oriented X 3, normal strength and tone. Normal symmetric reflexes. Normal coordination and gait  ECOG PERFORMANCE STATUS: 1 - Symptomatic but completely ambulatory  Blood pressure (!) 114/56, pulse 66, temperature 98.2 F (36.8 C), temperature source Oral, resp. rate 18, height 5\' 11"  (1.803 m), weight 205 lb 12.8 oz (93.4 kg), SpO2 100 %.  LABORATORY DATA: Lab Results  Component Value Date   WBC 4.2 02/20/2018   HGB 9.6 (L) 02/20/2018   HCT 29.5 (L) 02/20/2018   MCV 113.0 (H) 02/20/2018   PLT 276 02/20/2018      Chemistry      Component Value Date/Time   NA 141 02/20/2018 1023   NA 138 07/18/2017 1000   K 3.9 02/20/2018 1023   K 4.0 07/18/2017 1000   CL 105 02/20/2018 1023   CO2 25 02/20/2018 1023   CO2 25 07/18/2017 1000  BUN 15 02/20/2018 1023   BUN 14.9 07/18/2017 1000   CREATININE 0.86 02/20/2018 1023   CREATININE 0.8 07/18/2017 1000      Component Value Date/Time   CALCIUM 9.8 02/20/2018 1023   CALCIUM 9.7 07/18/2017 1000   ALKPHOS 97 02/20/2018 1023   ALKPHOS 93 07/18/2017 1000   AST 18 02/20/2018 1023   AST 22 07/18/2017 1000   ALT 16 02/20/2018 1023   ALT 24 07/18/2017 1000   BILITOT 0.3 02/20/2018 1023   BILITOT 0.54 07/18/2017 1000       RADIOGRAPHIC STUDIES: Ct Chest W Contrast  Result Date: 02/19/2018 CLINICAL DATA:  Left lung cancer. Chemotherapy and immunotherapy ongoing. History of prostate cancer with radiation therapy completed in 2017. Chronic cough.  EXAM: CT CHEST WITH CONTRAST TECHNIQUE: Multidetector CT imaging of the chest was performed during intravenous contrast administration. CONTRAST:  65mL OMNIPAQUE IOHEXOL 300 MG/ML  SOLN COMPARISON:  Chest CT 12/17/2017 and 10/16/2017. FINDINGS: Cardiovascular: Diffuse atherosclerosis of the aorta, great vessels and coronary arteries. Right IJ Port-A-Cath terminates near the confluence of the brachiocephalic veins. Subendocardial and probable papillary muscle calcifications within the left ventricle are again noted. There are aortic valvular calcifications. Mediastinum/Nodes: Stable 10 mm right paratracheal node on image 58. There is increased ill-defined soft tissue posteriorly in the AP window, measuring 2.2 x 1.9 cm on image 65/2. Left infrahilar node or central pulmonary nodule is less well-defined, although grossly stable, measuring approximately 17 x 14 mm on image 95/2. The thyroid gland, trachea and esophagus demonstrate no significant findings. Lungs/Pleura: There is no pleural effusion. As above, there is a stable left infrahilar node or pulmonary nodule with adjacent parenchymal scarring. There is a stable subpleural 4 mm nodule anteriorly in the left upper lobe (image 40/7). No new or enlarging nodules. Mild underlying centrilobular emphysema. Upper abdomen: The visualized upper abdomen appears stable with prominent lymph nodes in the gastrohepatic ligament. No adrenal mass. Musculoskeletal/Chest wall: There is no chest wall mass or suspicious osseous finding. Stable probable epidermal inclusion (sebaceous) cysts in the upper and lower back (axial images 5 and 144/2). IMPRESSION: 1. Interval mildly increased soft tissue thickening posteriorly in the AP window. This could be treatment related, although is suspicious for possible local recurrence. Attention on follow-up recommended. PET-CT could be performed if clinically warranted. 2. No other significant changes. Left infrahilar node or nodule appears  unchanged. Gastrohepatic ligament adenopathy appears unchanged. 3. Aortic Atherosclerosis (ICD10-I70.0). Old myocardial infarct with subendocardial calcification. 4. Stable position of the Port-A-Cath tip near the confluence of the brachiocephalic veins. Electronically Signed   By: Richardean Sale M.D.   On: 02/19/2018 12:48    ASSESSMENT AND PLAN:  This is a very pleasant 67 years old white male with recurrent non-small cell lung cancer initially diagnosed as stage IIIB non-small cell lung cancer favoring adenocarcinoma status post concurrent chemoradiation followed by consolidation chemotherapy. His last treatment was in September 2017. Restaging scan showed evidence for disease progression and the patient was a started on systemic chemotherapy with carboplatin, Alimta and Keytruda for 4 cycles.  His repeat imaging studies showed improvement of his disease.  He is currently undergoing maintenance treatment with Alimta and Keytruda status post 9 cycles. The patient continues to tolerate his treatment fairly well except for the increased lacrimation. He had repeat CT scan of the chest, abdomen and pelvis performed recently.  I personally and independently reviewed the scan images and discussed the result and showed the images to the patient and his wife.  His scan showed no concerning findings for disease progression also there are some changes in the configuration of the soft tissue mass in the AP window area. I recommended for the patient to continue his current treatment with maintenance Alimta and Ketruda (pembrolizumab). I will see him back for follow-up visit in 3 weeks for evaluation before starting cycle #11. He was advised to call immediately if he has any concerning symptoms in the interval. For the increased lacrimation from treatment with Alimta, he will use over-the-counter Systane eyedrop. The patient voices understanding of current disease status and treatment options and is in agreement  with the current care plan. All questions were answered. The patient knows to call the clinic with any problems, questions or concerns. We can certainly see the patient much sooner if necessary.  Disclaimer: This note was dictated with voice recognition software. Similar sounding words can inadvertently be transcribed and may not be corrected upon review.

## 2018-02-20 NOTE — Patient Instructions (Signed)
Steps to Quit Smoking Smoking tobacco can be bad for your health. It can also affect almost every organ in your body. Smoking puts you and people around you at risk for many serious long-lasting (chronic) diseases. Quitting smoking is hard, but it is one of the best things that you can do for your health. It is never too late to quit. What are the benefits of quitting smoking? When you quit smoking, you lower your risk for getting serious diseases and conditions. They can include:  Lung cancer or lung disease.  Heart disease.  Stroke.  Heart attack.  Not being able to have children (infertility).  Weak bones (osteoporosis) and broken bones (fractures).  If you have coughing, wheezing, and shortness of breath, those symptoms may get better when you quit. You may also get sick less often. If you are pregnant, quitting smoking can help to lower your chances of having a baby of low birth weight. What can I do to help me quit smoking? Talk with your doctor about what can help you quit smoking. Some things you can do (strategies) include:  Quitting smoking totally, instead of slowly cutting back how much you smoke over a period of time.  Going to in-person counseling. You are more likely to quit if you go to many counseling sessions.  Using resources and support systems, such as: ? Online chats with a counselor. ? Phone quitlines. ? Printed self-help materials. ? Support groups or group counseling. ? Text messaging programs. ? Mobile phone apps or applications.  Taking medicines. Some of these medicines may have nicotine in them. If you are pregnant or breastfeeding, do not take any medicines to quit smoking unless your doctor says it is okay. Talk with your doctor about counseling or other things that can help you.  Talk with your doctor about using more than one strategy at the same time, such as taking medicines while you are also going to in-person counseling. This can help make  quitting easier. What things can I do to make it easier to quit? Quitting smoking might feel very hard at first, but there is a lot that you can do to make it easier. Take these steps:  Talk to your family and friends. Ask them to support and encourage you.  Call phone quitlines, reach out to support groups, or work with a counselor.  Ask people who smoke to not smoke around you.  Avoid places that make you want (trigger) to smoke, such as: ? Bars. ? Parties. ? Smoke-break areas at work.  Spend time with people who do not smoke.  Lower the stress in your life. Stress can make you want to smoke. Try these things to help your stress: ? Getting regular exercise. ? Deep-breathing exercises. ? Yoga. ? Meditating. ? Doing a body scan. To do this, close your eyes, focus on one area of your body at a time from head to toe, and notice which parts of your body are tense. Try to relax the muscles in those areas.  Download or buy apps on your mobile phone or tablet that can help you stick to your quit plan. There are many free apps, such as QuitGuide from the CDC (Centers for Disease Control and Prevention). You can find more support from smokefree.gov and other websites.  This information is not intended to replace advice given to you by your health care provider. Make sure you discuss any questions you have with your health care provider. Document Released: 04/29/2009 Document   Revised: 02/29/2016 Document Reviewed: 11/17/2014 Elsevier Interactive Patient Education  2018 [employer]  

## 2018-02-20 NOTE — Telephone Encounter (Signed)
Scheduled appt per 8/7 los - pt to get an updated schedule next visit.

## 2018-02-23 ENCOUNTER — Other Ambulatory Visit: Payer: Self-pay | Admitting: Family Medicine

## 2018-03-07 ENCOUNTER — Other Ambulatory Visit: Payer: Self-pay | Admitting: Internal Medicine

## 2018-03-07 DIAGNOSIS — Z7189 Other specified counseling: Secondary | ICD-10-CM

## 2018-03-07 DIAGNOSIS — Z5112 Encounter for antineoplastic immunotherapy: Secondary | ICD-10-CM

## 2018-03-07 DIAGNOSIS — R5382 Chronic fatigue, unspecified: Secondary | ICD-10-CM

## 2018-03-07 DIAGNOSIS — C3432 Malignant neoplasm of lower lobe, left bronchus or lung: Secondary | ICD-10-CM

## 2018-03-07 DIAGNOSIS — Z23 Encounter for immunization: Secondary | ICD-10-CM

## 2018-03-13 ENCOUNTER — Inpatient Hospital Stay: Payer: BLUE CROSS/BLUE SHIELD

## 2018-03-13 ENCOUNTER — Encounter: Payer: Self-pay | Admitting: Internal Medicine

## 2018-03-13 ENCOUNTER — Inpatient Hospital Stay (HOSPITAL_BASED_OUTPATIENT_CLINIC_OR_DEPARTMENT_OTHER): Payer: BLUE CROSS/BLUE SHIELD | Admitting: Internal Medicine

## 2018-03-13 VITALS — BP 116/56 | HR 67 | Temp 98.2°F | Resp 18 | Ht 71.0 in | Wt 202.6 lb

## 2018-03-13 DIAGNOSIS — C3432 Malignant neoplasm of lower lobe, left bronchus or lung: Secondary | ICD-10-CM

## 2018-03-13 DIAGNOSIS — H04209 Unspecified epiphora, unspecified lacrimal gland: Secondary | ICD-10-CM | POA: Diagnosis not present

## 2018-03-13 DIAGNOSIS — Z5111 Encounter for antineoplastic chemotherapy: Secondary | ICD-10-CM

## 2018-03-13 DIAGNOSIS — Z5112 Encounter for antineoplastic immunotherapy: Secondary | ICD-10-CM | POA: Diagnosis not present

## 2018-03-13 DIAGNOSIS — I10 Essential (primary) hypertension: Secondary | ICD-10-CM

## 2018-03-13 LAB — CBC WITH DIFFERENTIAL (CANCER CENTER ONLY)
Basophils Absolute: 0 10*3/uL (ref 0.0–0.1)
Basophils Relative: 0 %
Eosinophils Absolute: 0 10*3/uL (ref 0.0–0.5)
Eosinophils Relative: 0 %
HCT: 30 % — ABNORMAL LOW (ref 38.4–49.9)
Hemoglobin: 10.2 g/dL — ABNORMAL LOW (ref 13.0–17.1)
Lymphocytes Relative: 13 %
Lymphs Abs: 0.5 10*3/uL — ABNORMAL LOW (ref 0.9–3.3)
MCH: 37.8 pg — ABNORMAL HIGH (ref 27.2–33.4)
MCHC: 33.8 g/dL (ref 32.0–36.0)
MCV: 111.9 fL — ABNORMAL HIGH (ref 79.3–98.0)
Monocytes Absolute: 0.6 10*3/uL (ref 0.1–0.9)
Monocytes Relative: 15 %
Neutro Abs: 2.9 10*3/uL (ref 1.5–6.5)
Neutrophils Relative %: 72 %
Platelet Count: 266 10*3/uL (ref 140–400)
RBC: 2.69 MIL/uL — ABNORMAL LOW (ref 4.20–5.82)
RDW: 16 % — ABNORMAL HIGH (ref 11.0–14.6)
WBC Count: 4.1 10*3/uL (ref 4.0–10.3)

## 2018-03-13 LAB — CMP (CANCER CENTER ONLY)
ALT: 14 U/L (ref 0–44)
AST: 21 U/L (ref 15–41)
Albumin: 4 g/dL (ref 3.5–5.0)
Alkaline Phosphatase: 100 U/L (ref 38–126)
Anion gap: 9 (ref 5–15)
BUN: 23 mg/dL (ref 8–23)
CO2: 27 mmol/L (ref 22–32)
Calcium: 9.9 mg/dL (ref 8.9–10.3)
Chloride: 104 mmol/L (ref 98–111)
Creatinine: 1.08 mg/dL (ref 0.61–1.24)
GFR, Est AFR Am: >60 mL/min (ref >60)
GFR, Estimated: >60 mL/min (ref >60)
Glucose, Bld: 121 mg/dL — ABNORMAL HIGH (ref 70–99)
Potassium: 4.1 mmol/L (ref 3.5–5.1)
Sodium: 140 mmol/L (ref 135–145)
Total Bilirubin: 0.4 mg/dL (ref 0.3–1.2)
Total Protein: 7.2 g/dL (ref 6.5–8.1)

## 2018-03-13 MED ORDER — PROCHLORPERAZINE MALEATE 10 MG PO TABS
10.0000 mg | ORAL_TABLET | Freq: Once | ORAL | Status: AC
  Administered 2018-03-13: 10 mg via ORAL

## 2018-03-13 MED ORDER — SODIUM CHLORIDE 0.9% FLUSH
10.0000 mL | INTRAVENOUS | Status: DC | PRN
  Administered 2018-03-13: 10 mL via INTRAVENOUS
  Filled 2018-03-13: qty 10

## 2018-03-13 MED ORDER — SODIUM CHLORIDE 0.9% FLUSH
10.0000 mL | INTRAVENOUS | Status: DC | PRN
  Administered 2018-03-13: 10 mL
  Filled 2018-03-13: qty 10

## 2018-03-13 MED ORDER — SODIUM CHLORIDE 0.9 % IV SOLN
Freq: Once | INTRAVENOUS | Status: AC
  Administered 2018-03-13: 11:00:00 via INTRAVENOUS
  Filled 2018-03-13: qty 250

## 2018-03-13 MED ORDER — SODIUM CHLORIDE 0.9 % IV SOLN
200.0000 mg | Freq: Once | INTRAVENOUS | Status: AC
  Administered 2018-03-13: 200 mg via INTRAVENOUS
  Filled 2018-03-13: qty 8

## 2018-03-13 MED ORDER — PROCHLORPERAZINE MALEATE 10 MG PO TABS
ORAL_TABLET | ORAL | Status: AC
  Filled 2018-03-13: qty 1

## 2018-03-13 MED ORDER — SODIUM CHLORIDE 0.9 % IV SOLN
500.0000 mg/m2 | Freq: Once | INTRAVENOUS | Status: AC
  Administered 2018-03-13: 1100 mg via INTRAVENOUS
  Filled 2018-03-13: qty 40

## 2018-03-13 MED ORDER — HEPARIN SOD (PORK) LOCK FLUSH 100 UNIT/ML IV SOLN
500.0000 [IU] | Freq: Once | INTRAVENOUS | Status: AC | PRN
  Administered 2018-03-13: 500 [IU]
  Filled 2018-03-13: qty 5

## 2018-03-13 NOTE — Patient Instructions (Signed)
Implanted Port Home Guide An implanted port is a type of central line that is placed under the skin. Central lines are used to provide IV access when treatment or nutrition needs to be given through a person's veins. Implanted ports are used for long-term IV access. An implanted port may be placed because:  You need IV medicine that would be irritating to the small veins in your hands or arms.  You need long-term IV medicines, such as antibiotics.  You need IV nutrition for a long period.  You need frequent blood draws for lab tests.  You need dialysis.  Implanted ports are usually placed in the chest area, but they can also be placed in the upper arm, the abdomen, or the leg. An implanted port has two main parts:  Reservoir. The reservoir is round and will appear as a small, raised area under your skin. The reservoir is the part where a needle is inserted to give medicines or draw blood.  Catheter. The catheter is a thin, flexible tube that extends from the reservoir. The catheter is placed into a large vein. Medicine that is inserted into the reservoir goes into the catheter and then into the vein.  How will I care for my incision site? Do not get the incision site wet. Bathe or shower as directed by your health care provider. How is my port accessed? Special steps must be taken to access the port:  Before the port is accessed, a numbing cream can be placed on the skin. This helps numb the skin over the port site.  Your health care provider uses a sterile technique to access the port. ? Your health care provider must put on a mask and sterile gloves. ? The skin over your port is cleaned carefully with an antiseptic and allowed to dry. ? The port is gently pinched between sterile gloves, and a needle is inserted into the port.  Only "non-coring" port needles should be used to access the port. Once the port is accessed, a blood return should be checked. This helps ensure that the port  is in the vein and is not clogged.  If your port needs to remain accessed for a constant infusion, a clear (transparent) bandage will be placed over the needle site. The bandage and needle will need to be changed every week, or as directed by your health care provider.  Keep the bandage covering the needle clean and dry. Do not get it wet. Follow your health care provider's instructions on how to take a shower or bath while the port is accessed.  If your port does not need to stay accessed, no bandage is needed over the port.  What is flushing? Flushing helps keep the port from getting clogged. Follow your health care provider's instructions on how and when to flush the port. Ports are usually flushed with saline solution or a medicine called heparin. The need for flushing will depend on how the port is used.  If the port is used for intermittent medicines or blood draws, the port will need to be flushed: ? After medicines have been given. ? After blood has been drawn. ? As part of routine maintenance.  If a constant infusion is running, the port may not need to be flushed.  How long will my port stay implanted? The port can stay in for as long as your health care provider thinks it is needed. When it is time for the port to come out, surgery will be   done to remove it. The procedure is similar to the one performed when the port was put in. When should I seek immediate medical care? When you have an implanted port, you should seek immediate medical care if:  You notice a bad smell coming from the incision site.  You have swelling, redness, or drainage at the incision site.  You have more swelling or pain at the port site or the surrounding area.  You have a fever that is not controlled with medicine.  This information is not intended to replace advice given to you by your health care provider. Make sure you discuss any questions you have with your health care provider. Document  Released: 07/03/2005 Document Revised: 12/09/2015 Document Reviewed: 03/10/2013 [employer] Interactive Patient Education  2017 [employer]  

## 2018-03-13 NOTE — Progress Notes (Signed)
[place] [phone]   Fax:(336) [phone]  OFFICE PROGRESS NOTE  [name], MD [place] [address]  DIAGNOSIS: recurrent non-small cell lung cancer initially diagnosed as Stage IIIB (T1b, N3, M0) non-small cell lung cancer favoring adenocarcinoma presented with left lower lobe pulmonary nodule in addition to left hilar and bilateral mediastinal lymphadenopathy in addition to right supraclavicular lymph nodes diagnosed in April 2017.  PRIOR THERAPY: 1) Concurrent chemoradiation with weekly carboplatin for AUC of 2 and paclitaxel 45 mg/M2 started on 11/15/2015. He is status post 6 cycles. Last dose was getting 12/20/2015 with partial response. 2) Consolidation chemotherapy with carboplatin for AUC of 5 and paclitaxel 175 MG/M2 every 3 weeks with Neulasta support. Status post 3 cycles. Last dose was given 04/03/2016.  CURRENT THERAPY: systemic chemotherapy with carboplatin for AUC of 5, Alimta 500 MG/M2 and Ketruda 200 MG IV every 3 weeks. First dose 05/16/2017.  Status post 14 cycle.  Starting from cycle #5 the patient is treated with maintenance Alimta and Keytruda only.  INTERVAL HISTORY: Brian White 67 y.o. male returns to the clinic today for follow-up visit accompanied by his wife.  The patient is feeling fine today with no concerning complaints.  He continues to tolerate his maintenance treatment with Alimta and Ketruda (pembrolizumab) fairly well.  He denied having any chest pain, shortness of breath, cough or hemoptysis.  He denied having any fever or chills.  He has no nausea, vomiting, diarrhea or constipation.  He is here for evaluation before starting cycle #15.   MEDICAL HISTORY: Past Medical History:  Diagnosis Date  . Adenocarcinoma, lung ([place]) 10/08/2015   2.4cm spiculated LLL nodule suspicious for primary bronchogenic carcinoma by CT 09/2015 - adenocarcinoma by endobronchial biopsy  . Allergy   . Arthritis     . CAD (coronary artery disease)    mild MI; pt unaware - seen on stress test  . COPD (chronic obstructive pulmonary disease) ([place]) 06/26/2011   Mild centrilobular and paraseptal emphysema by CT 09/2015   . ED (erectile dysfunction)   . H/O anaphylactic shock 2013   hives,hypotension,syncope  . H/O seasonal allergies   . Herpes zoster 12/06/2015  . Hypertension   . Polycythemia 01/25/2014  . Prostate cancer [facility]) 2013   s/p radiation therapy March 2013, planned f/u with uro after XRT  . Radiation 09/18/2011 through 11/10/2011   Prostate 7600 cGy 40 sessions, seminal vesicles 5600 cGy 40 sessions    . Radiation 11/10/15-12/22/15   chest 60 Gy  . Shortness of breath dyspnea   . Smoker   . Thoracic aorta atherosclerosis ([place]) 10/08/2015   Mild by CT 09/2015     ALLERGIES:  is allergic to onion.  MEDICATIONS:  Current Outpatient Medications  Medication Sig Dispense Refill  . aspirin 81 MG tablet Take 81 mg by mouth daily. Reported on 12/21/2015    . budesonide-formoterol (SYMBICORT) 160-4.5 MCG/ACT inhaler Inhale 1 puff into the lungs 2 (two) times daily. 3 Inhaler 3  . cetirizine (ZYRTEC) 10 MG chewable tablet Chew 10 mg by mouth daily.    Marland Kitchen dexamethasone (DECADRON) 4 MG tablet 4 mg by mouth twice a day the day before, day of and after the chemotherapy every 3 weeks. 40 tablet 0  . diphenhydrAMINE (BENADRYL) 25 MG tablet Take 25 mg by mouth at bedtime as needed for sleep.    . folic acid (FOLVITE) 1 MG tablet TAKE 1 TABLET(1 MG) BY MOUTH DAILY 30  tablet 0  . guaiFENesin (MUCINEX) 600 MG 12 hr tablet Take by mouth 2 (two) times daily.    . hydrOXYzine (ATARAX/VISTARIL) 10 MG tablet Take 1 tablet (10 mg total) by mouth 3 (three) times daily as needed. 30 tablet 0  . ibuprofen (ADVIL,MOTRIN) 200 MG tablet Take 600 mg by mouth every 6 (six) hours as needed (Pain). Reported on 12/21/2015    . lidocaine-prilocaine (EMLA) cream Apply 1 application topically as needed. Squeeze a   small amount of cream to cotton ball and apply to port site 1-2 hours prior to chemotherapy. Do not rub in the cream and cover  with plastic wrap. 30 g 0  . losartan-hydrochlorothiazide (HYZAAR) 100-25 MG tablet TAKE 1 TABLET BY MOUTH EVERY DAY 30 tablet 0  . prochlorperazine (COMPAZINE) 10 MG tablet Take 1 tablet (10 mg total) by mouth every 6 (six) hours as needed for nausea or vomiting. 30 tablet 0   No current facility-administered medications for this visit.    Facility-Administered Medications Ordered in Other Visits  Medication Dose Route Frequency Provider Last Rate Last Dose  . sodium chloride flush (NS) 0.9 % injection 10 mL  10 mL Intravenous PRN Maryanna Shape, NP   10 mL at 03/13/18 1013    SURGICAL HISTORY:  Past Surgical History:  Procedure Laterality Date  . COLONOSCOPY W/ BIOPSIES AND POLYPECTOMY  10/11/15   mult TAs, 3cm rectal polypoid lesion; rec further treatment, diverticulosis ([place])  . ENDOBRONCHIAL ULTRASOUND Bilateral 10/18/2015   Procedure: ENDOBRONCHIAL ULTRASOUND;  Surgeon: Javier Glazier, MD;  Location: [facility] END[facility]COPY;  Service: Cardiopulmonary;  Laterality: Bilateral;  . HERNIA REPAIR Right 2005   with mesh  . INSERTION PROSTATE RADIATION SEED  08/2011   prostate cancer, [facility]  . IR FLUORO GUIDE PORT INSERTION RIGHT  07/26/2017  . IR US GUIDE VASC ACCESS RIGHT  07/26/2017    REVIEW OF SYSTEMS:  A comprehensive review of systems was negative.   PHYSICAL EXAMINATION: General appearance: alert, cooperative and no distress Head: Normocephalic, without obvious abnormality, atraumatic Neck: no adenopathy, no JVD, supple, symmetrical, trachea midline and thyroid not enlarged, symmetric, no tenderness/mass/nodules Lymph nodes: Cervical, supraclavicular, and axillary nodes normal. Resp: clear to auscultation bilaterally Back: symmetric, no curvature. ROM normal. No CVA tenderness. Cardio: regular rate and rhythm, S1, S2 normal, no murmur, click, rub or  gallop GI: soft, non-tender; bowel sounds normal; no masses,  no organomegaly Extremities: extremities normal, atraumatic, no cyanosis or edema  ECOG PERFORMANCE STATUS: 1 - Symptomatic but completely ambulatory  Blood pressure (!) 116/56, pulse 67, temperature 98.2 F (36.8 C), temperature source Oral, resp. rate 18, height 5\' 11"  (1.803 m), weight 202 lb 9.6 oz (91.9 kg), SpO2 100 %.  LABORATORY DATA: Lab Results  Component Value Date   WBC 4.1 03/13/2018   HGB 10.2 (L) 03/13/2018   HCT 30.0 (L) 03/13/2018   MCV 111.9 (H) 03/13/2018   PLT 266 03/13/2018      Chemistry      Component Value Date/Time   NA 141 02/20/2018 1023   NA 138 07/18/2017 1000   K 3.9 02/20/2018 1023   K 4.0 07/18/2017 1000   CL 105 02/20/2018 1023   CO2 25 02/20/2018 1023   CO2 25 07/18/2017 1000   BUN 15 02/20/2018 1023   BUN 14.9 07/18/2017 1000   CREATININE 0.86 02/20/2018 1023   CREATININE 0.8 07/18/2017 1000      Component Value Date/Time   CALCIUM 9.8 02/20/2018 1023   CALCIUM  9.7 07/18/2017 1000   ALKPHOS 97 02/20/2018 1023   ALKPHOS 93 07/18/2017 1000   AST 18 02/20/2018 1023   AST 22 07/18/2017 1000   ALT 16 02/20/2018 1023   ALT 24 07/18/2017 1000   BILITOT 0.3 02/20/2018 1023   BILITOT 0.54 07/18/2017 1000       RADIOGRAPHIC STUDIES: Ct Chest W Contrast  Result Date: 02/19/2018 CLINICAL DATA:  Left lung cancer. Chemotherapy and immunotherapy ongoing. History of prostate cancer with radiation therapy completed in 2017. Chronic cough. EXAM: CT CHEST WITH CONTRAST TECHNIQUE: Multidetector CT imaging of the chest was performed during intravenous contrast administration. CONTRAST:  62mL OMNIPAQUE IOHEXOL 300 MG/ML  SOLN COMPARISON:  Chest CT 12/17/2017 and 10/16/2017. FINDINGS: Cardiovascular: Diffuse atherosclerosis of the aorta, great vessels and coronary arteries. Right IJ Port-A-Cath terminates near the confluence of the brachiocephalic veins. Subendocardial and probable papillary  muscle calcifications within the left ventricle are again noted. There are aortic valvular calcifications. Mediastinum/Nodes: Stable 10 mm right paratracheal node on image 58. There is increased ill-defined soft tissue posteriorly in the AP window, measuring 2.2 x 1.9 cm on image 65/2. Left infrahilar node or central pulmonary nodule is less well-defined, although grossly stable, measuring approximately 17 x 14 mm on image 95/2. The thyroid gland, trachea and esophagus demonstrate no significant findings. Lungs/Pleura: There is no pleural effusion. As above, there is a stable left infrahilar node or pulmonary nodule with adjacent parenchymal scarring. There is a stable subpleural 4 mm nodule anteriorly in the left upper lobe (image 40/7). No new or enlarging nodules. Mild underlying centrilobular emphysema. Upper abdomen: The visualized upper abdomen appears stable with prominent lymph nodes in the gastrohepatic ligament. No adrenal mass. Musculoskeletal/Chest wall: There is no chest wall mass or suspicious osseous finding. Stable probable epidermal inclusion (sebaceous) cysts in the upper and lower back (axial images 5 and 144/2). IMPRESSION: 1. Interval mildly increased soft tissue thickening posteriorly in the AP window. This could be treatment related, although is suspicious for possible local recurrence. Attention on follow-up recommended. PET-CT could be performed if clinically warranted. 2. No other significant changes. Left infrahilar node or nodule appears unchanged. Gastrohepatic ligament adenopathy appears unchanged. 3. Aortic Atherosclerosis (ICD10-I70.0). Old myocardial infarct with subendocardial calcification. 4. Stable position of the Port-A-Cath tip near the confluence of the brachiocephalic veins. Electronically Signed   By: Richardean Sale M.D.   On: 02/19/2018 12:48    ASSESSMENT AND PLAN:  This is a very pleasant 67 years old white male with recurrent non-small cell lung cancer initially  diagnosed as stage IIIB non-small cell lung cancer favoring adenocarcinoma status post concurrent chemoradiation followed by consolidation chemotherapy. His last treatment was in September 2017. Restaging scan showed evidence for disease progression and the patient was a started on systemic chemotherapy with carboplatin, Alimta and Keytruda for 4 cycles.  His repeat imaging studies showed improvement of his disease.  He is currently undergoing maintenance treatment with Alimta and Keytruda status post 14 cycles. The patient continues to tolerate his treatment well with no concerning adverse effects. I recommended for him to proceed with cycle #15 today as scheduled. I will see him back for follow-up visit in 3 weeks for evaluation before starting cycle #16. The patient was advised to call immediately if he has any concerning symptoms in the interval. For the increased lacrimation from treatment with Alimta, he will use over-the-counter Systane eyedrop. The patient voices understanding of current disease status and treatment options and is in agreement with the  current care plan. All questions were answered. The patient knows to call the clinic with any problems, questions or concerns. We can certainly see the patient much sooner if necessary.  Disclaimer: This note was dictated with voice recognition software. Similar sounding words can inadvertently be transcribed and may not be corrected upon review.

## 2018-03-13 NOTE — Patient Instructions (Signed)
[facility] Discharge Instructions for Patients Receiving Chemotherapy  Today you received the following chemotherapy agents Keytruda and Alimta  To help prevent nausea and vomiting after your treatment, we encourage you to take your nausea medication as directed   If you develop nausea and vomiting that is not controlled by your nausea medication, call the clinic.   BELOW ARE SYMPTOMS THAT SHOULD BE REPORTED IMMEDIATELY:  *FEVER GREATER THAN 100.5 F  *CHILLS WITH OR WITHOUT FEVER  NAUSEA AND VOMITING THAT IS NOT CONTROLLED WITH YOUR NAUSEA MEDICATION  *UNUSUAL SHORTNESS OF BREATH  *UNUSUAL BRUISING OR BLEEDING  TENDERNESS IN MOUTH AND THROAT WITH OR WITHOUT PRESENCE OF ULCERS  *URINARY PROBLEMS  *BOWEL PROBLEMS  UNUSUAL RASH Items with * indicate a potential emergency and should be followed up as soon as possible.  Feel free to call the clinic should you have any questions or concerns. The clinic phone number is [phone].  Please show the [facility] at check-in to the Emergency Department and triage nurse.

## 2018-03-13 NOTE — Progress Notes (Signed)
Per Dr. Julien Nordmann port placement in "confluence of brachiocephalic vein" sufficient for treatment.

## 2018-03-14 ENCOUNTER — Telehealth: Payer: Self-pay | Admitting: Oncology

## 2018-03-14 NOTE — Telephone Encounter (Signed)
3 cycles already scheduled per 8/28 los.

## 2018-04-01 ENCOUNTER — Other Ambulatory Visit: Payer: Self-pay | Admitting: Family Medicine

## 2018-04-03 ENCOUNTER — Inpatient Hospital Stay: Payer: BLUE CROSS/BLUE SHIELD

## 2018-04-03 ENCOUNTER — Encounter: Payer: Self-pay | Admitting: Internal Medicine

## 2018-04-03 ENCOUNTER — Telehealth: Payer: Self-pay | Admitting: Oncology

## 2018-04-03 ENCOUNTER — Inpatient Hospital Stay (HOSPITAL_BASED_OUTPATIENT_CLINIC_OR_DEPARTMENT_OTHER): Payer: BLUE CROSS/BLUE SHIELD | Admitting: Internal Medicine

## 2018-04-03 ENCOUNTER — Inpatient Hospital Stay: Payer: BLUE CROSS/BLUE SHIELD | Attending: Internal Medicine

## 2018-04-03 VITALS — BP 114/55 | HR 66 | Temp 98.2°F | Resp 17 | Ht 71.0 in | Wt 201.5 lb

## 2018-04-03 DIAGNOSIS — Z5112 Encounter for antineoplastic immunotherapy: Secondary | ICD-10-CM | POA: Insufficient documentation

## 2018-04-03 DIAGNOSIS — C3432 Malignant neoplasm of lower lobe, left bronchus or lung: Secondary | ICD-10-CM

## 2018-04-03 DIAGNOSIS — Z79899 Other long term (current) drug therapy: Secondary | ICD-10-CM | POA: Diagnosis not present

## 2018-04-03 DIAGNOSIS — Z5111 Encounter for antineoplastic chemotherapy: Secondary | ICD-10-CM | POA: Diagnosis present

## 2018-04-03 DIAGNOSIS — C3490 Malignant neoplasm of unspecified part of unspecified bronchus or lung: Secondary | ICD-10-CM

## 2018-04-03 DIAGNOSIS — I10 Essential (primary) hypertension: Secondary | ICD-10-CM

## 2018-04-03 DIAGNOSIS — Z7189 Other specified counseling: Secondary | ICD-10-CM

## 2018-04-03 DIAGNOSIS — H04209 Unspecified epiphora, unspecified lacrimal gland: Secondary | ICD-10-CM | POA: Diagnosis not present

## 2018-04-03 DIAGNOSIS — R5382 Chronic fatigue, unspecified: Secondary | ICD-10-CM

## 2018-04-03 DIAGNOSIS — J449 Chronic obstructive pulmonary disease, unspecified: Secondary | ICD-10-CM

## 2018-04-03 DIAGNOSIS — Z23 Encounter for immunization: Secondary | ICD-10-CM

## 2018-04-03 LAB — CBC WITH DIFFERENTIAL (CANCER CENTER ONLY)
Basophils Absolute: 0 10*3/uL (ref 0.0–0.1)
Basophils Relative: 1 %
Eosinophils Absolute: 0 10*3/uL (ref 0.0–0.5)
Eosinophils Relative: 0 %
HCT: 27.7 % — ABNORMAL LOW (ref 38.4–49.9)
Hemoglobin: 9.4 g/dL — ABNORMAL LOW (ref 13.0–17.1)
Lymphocytes Relative: 12 %
Lymphs Abs: 0.4 10*3/uL — ABNORMAL LOW (ref 0.9–3.3)
MCH: 37.2 pg — ABNORMAL HIGH (ref 27.2–33.4)
MCHC: 33.8 g/dL (ref 32.0–36.0)
MCV: 110.1 fL — ABNORMAL HIGH (ref 79.3–98.0)
Monocytes Absolute: 0.3 10*3/uL (ref 0.1–0.9)
Monocytes Relative: 8 %
Neutro Abs: 2.9 10*3/uL (ref 1.5–6.5)
Neutrophils Relative %: 79 %
Platelet Count: 232 10*3/uL (ref 140–400)
RBC: 2.52 MIL/uL — ABNORMAL LOW (ref 4.20–5.82)
RDW: 16.2 % — ABNORMAL HIGH (ref 11.0–14.6)
WBC Count: 3.7 10*3/uL — ABNORMAL LOW (ref 4.0–10.3)

## 2018-04-03 LAB — TSH: TSH: 0.14 u[IU]/mL — ABNORMAL LOW (ref 0.320–4.118)

## 2018-04-03 LAB — CMP (CANCER CENTER ONLY)
ALT: 11 U/L (ref 0–44)
AST: 21 U/L (ref 15–41)
Albumin: 3.8 g/dL (ref 3.5–5.0)
Alkaline Phosphatase: 91 U/L (ref 38–126)
Anion gap: 10 (ref 5–15)
BUN: 15 mg/dL (ref 8–23)
CO2: 25 mmol/L (ref 22–32)
Calcium: 9.8 mg/dL (ref 8.9–10.3)
Chloride: 104 mmol/L (ref 98–111)
Creatinine: 0.82 mg/dL (ref 0.61–1.24)
GFR, Est AFR Am: >60 mL/min (ref >60)
GFR, Estimated: >60 mL/min (ref >60)
Glucose, Bld: 122 mg/dL — ABNORMAL HIGH (ref 70–99)
Potassium: 4.2 mmol/L (ref 3.5–5.1)
Sodium: 139 mmol/L (ref 135–145)
Total Bilirubin: 0.5 mg/dL (ref 0.3–1.2)
Total Protein: 6.8 g/dL (ref 6.5–8.1)

## 2018-04-03 MED ORDER — DEXAMETHASONE 4 MG PO TABS: ORAL_TABLET | ORAL | 0 refills | Status: DC

## 2018-04-03 MED ORDER — PROCHLORPERAZINE MALEATE 10 MG PO TABS
ORAL_TABLET | ORAL | Status: AC
  Filled 2018-04-03: qty 1

## 2018-04-03 MED ORDER — PROCHLORPERAZINE MALEATE 10 MG PO TABS
10.0000 mg | ORAL_TABLET | Freq: Once | ORAL | Status: AC
  Administered 2018-04-03: 10 mg via ORAL

## 2018-04-03 MED ORDER — SODIUM CHLORIDE 0.9 % IV SOLN
Freq: Once | INTRAVENOUS | Status: AC
  Administered 2018-04-03: 11:00:00 via INTRAVENOUS
  Filled 2018-04-03: qty 250

## 2018-04-03 MED ORDER — SODIUM CHLORIDE 0.9 % IV SOLN
500.0000 mg/m2 | Freq: Once | INTRAVENOUS | Status: AC
  Administered 2018-04-03: 1100 mg via INTRAVENOUS
  Filled 2018-04-03: qty 40

## 2018-04-03 MED ORDER — SODIUM CHLORIDE 0.9% FLUSH
10.0000 mL | INTRAVENOUS | Status: DC | PRN
  Administered 2018-04-03: 10 mL
  Filled 2018-04-03: qty 10

## 2018-04-03 MED ORDER — SODIUM CHLORIDE 0.9 % IV SOLN
200.0000 mg | Freq: Once | INTRAVENOUS | Status: AC
  Administered 2018-04-03: 200 mg via INTRAVENOUS
  Filled 2018-04-03: qty 8

## 2018-04-03 MED ORDER — HEPARIN SOD (PORK) LOCK FLUSH 100 UNIT/ML IV SOLN
500.0000 [IU] | Freq: Once | INTRAVENOUS | Status: AC | PRN
  Administered 2018-04-03: 500 [IU]
  Filled 2018-04-03: qty 5

## 2018-04-03 MED ORDER — SODIUM CHLORIDE 0.9% FLUSH
10.0000 mL | INTRAVENOUS | Status: DC | PRN
  Administered 2018-04-03: 10 mL via INTRAVENOUS
  Filled 2018-04-03: qty 10

## 2018-04-03 MED ORDER — FOLIC ACID 1 MG PO TABS: ORAL_TABLET | ORAL | 0 refills | Status: DC

## 2018-04-03 NOTE — Patient Instructions (Signed)
[facility] Discharge Instructions for Patients Receiving Chemotherapy  Today you received the following chemotherapy agents Alimta,keytruda  To help prevent nausea and vomiting after your treatment, we encourage you to take your nausea medication as directed  If you develop nausea and vomiting that is not controlled by your nausea medication, call the clinic.   BELOW ARE SYMPTOMS THAT SHOULD BE REPORTED IMMEDIATELY:  *FEVER GREATER THAN 100.5 F  *CHILLS WITH OR WITHOUT FEVER  NAUSEA AND VOMITING THAT IS NOT CONTROLLED WITH YOUR NAUSEA MEDICATION  *UNUSUAL SHORTNESS OF BREATH  *UNUSUAL BRUISING OR BLEEDING  TENDERNESS IN MOUTH AND THROAT WITH OR WITHOUT PRESENCE OF ULCERS  *URINARY PROBLEMS  *BOWEL PROBLEMS  UNUSUAL RASH Items with * indicate a potential emergency and should be followed up as soon as possible.  Feel free to call the clinic should you have any questions or concerns. The clinic phone number is [phone].  Please show the [place] at check-in to the Emergency Department and triage nurse.

## 2018-04-03 NOTE — Telephone Encounter (Signed)
Appts scheduled letter/calendar mailed asn LMVM for patient per 9/18 los

## 2018-04-03 NOTE — Progress Notes (Signed)
[place] Telephone:(336) [phone]   Fax:(336) [phone]  OFFICE PROGRESS NOTE  [name], MD [place] [address]  DIAGNOSIS: recurrent non-small cell lung cancer initially diagnosed as Stage IIIB (T1b, N3, M0) non-small cell lung cancer favoring adenocarcinoma presented with left lower lobe pulmonary nodule in addition to left hilar and bilateral mediastinal lymphadenopathy in addition to right supraclavicular lymph nodes diagnosed in April 2017.  PRIOR THERAPY: 1) Concurrent chemoradiation with weekly carboplatin for AUC of 2 and paclitaxel 45 mg/M2 started on 11/15/2015. He is status post 6 cycles. Last dose was getting 12/20/2015 with partial response. 2) Consolidation chemotherapy with carboplatin for AUC of 5 and paclitaxel 175 MG/M2 every 3 weeks with Neulasta support. Status post 3 cycles. Last dose was given 04/03/2016.  CURRENT THERAPY: systemic chemotherapy with carboplatin for AUC of 5, Alimta 500 MG/M2 and Ketruda 200 MG IV every 3 weeks. First dose 05/16/2017.  Status post 15 cycle.  Starting from cycle #5 the patient is treated with maintenance Alimta and Keytruda only.  INTERVAL HISTORY: Brian White 67 y.o. male returns to the clinic today for follow-up visit accompanied by his wife.  The patient is feeling fine today with no concerning complaints.  He continues to tolerate his treatment with Alimta and Ketruda (pembrolizumab) fairly well.  He denied having any chest pain, shortness of breath, cough or hemoptysis.  He denied having any fever or chills.  He has no nausea, vomiting, diarrhea or constipation.  He has no significant weight loss or night sweats.  He moved recently to [facility].  He is here today for evaluation before starting cycle #16.   MEDICAL HISTORY: Past Medical History:  Diagnosis Date  . Adenocarcinoma, lung ([place]) 10/08/2015   2.4cm spiculated LLL nodule suspicious for primary bronchogenic  carcinoma by CT 09/2015 - adenocarcinoma by endobronchial biopsy  . Allergy   . Arthritis   . CAD (coronary artery disease)    mild MI; pt unaware - seen on stress test  . COPD (chronic obstructive pulmonary disease) ([place]) 06/26/2011   Mild centrilobular and paraseptal emphysema by CT 09/2015   . ED (erectile dysfunction)   . H/O anaphylactic shock 2013   hives,hypotension,syncope  . H/O seasonal allergies   . Herpes zoster 12/06/2015  . Hypertension   . Polycythemia 01/25/2014  . Prostate cancer [facility]) 2013   s/p radiation therapy March 2013, planned f/u with uro after XRT  . Radiation 09/18/2011 through 11/10/2011   Prostate 7600 cGy 40 sessions, seminal vesicles 5600 cGy 40 sessions    . Radiation 11/10/15-12/22/15   chest 60 Gy  . Shortness of breath dyspnea   . Smoker   . Thoracic aorta atherosclerosis ([place]) 10/08/2015   Mild by CT 09/2015     ALLERGIES:  is allergic to onion.  MEDICATIONS:  Current Outpatient Medications  Medication Sig Dispense Refill  . aspirin 81 MG tablet Take 81 mg by mouth daily. Reported on 12/21/2015    . budesonide-formoterol (SYMBICORT) 160-4.5 MCG/ACT inhaler Inhale 1 puff into the lungs 2 (two) times daily. 3 Inhaler 3  . cetirizine (ZYRTEC) 10 MG chewable tablet Chew 10 mg by mouth daily.    Marland Kitchen dexamethasone (DECADRON) 4 MG tablet 4 mg by mouth twice a day the day before, day of and after the chemotherapy every 3 weeks. 40 tablet 0  . diphenhydrAMINE (BENADRYL) 25 MG tablet Take 25 mg by mouth at bedtime as needed for sleep.    Marland Kitchen  folic acid (FOLVITE) 1 MG tablet TAKE 1 TABLET(1 MG) BY MOUTH DAILY 30 tablet 0  . guaiFENesin (MUCINEX) 600 MG 12 hr tablet Take by mouth 2 (two) times daily.    . hydrOXYzine (ATARAX/VISTARIL) 10 MG tablet Take 1 tablet (10 mg total) by mouth 3 (three) times daily as needed. 30 tablet 0  . ibuprofen (ADVIL,MOTRIN) 200 MG tablet Take 600 mg by mouth every 6 (six) hours as needed (Pain). Reported on 12/21/2015     . lidocaine-prilocaine (EMLA) cream Apply 1 application topically as needed. Squeeze a  small amount of cream to cotton ball and apply to port site 1-2 hours prior to chemotherapy. Do not rub in the cream and cover  with plastic wrap. 30 g 0  . losartan-hydrochlorothiazide (HYZAAR) 100-25 MG tablet TAKE 1 TABLET BY MOUTH EVERY DAY 30 tablet 0  . prochlorperazine (COMPAZINE) 10 MG tablet Take 1 tablet (10 mg total) by mouth every 6 (six) hours as needed for nausea or vomiting. 30 tablet 0   No current facility-administered medications for this visit.     SURGICAL HISTORY:  Past Surgical History:  Procedure Laterality Date  . COLONOSCOPY W/ BIOPSIES AND POLYPECTOMY  10/11/15   mult TAs, 3cm rectal polypoid lesion; rec further treatment, diverticulosis ([place])  . ENDOBRONCHIAL ULTRASOUND Bilateral 10/18/2015   Procedure: ENDOBRONCHIAL ULTRASOUND;  Surgeon: Javier Glazier, MD;  Location: [facility];  Service: Cardiopulmonary;  Laterality: Bilateral;  . HERNIA REPAIR Right 2005   with mesh  . INSERTION PROSTATE RADIATION SEED  08/2011   prostate cancer, [facility]  . IR FLUORO GUIDE PORT INSERTION RIGHT  07/26/2017  . IR US GUIDE VASC ACCESS RIGHT  07/26/2017    REVIEW OF SYSTEMS:  A comprehensive review of systems was negative.   PHYSICAL EXAMINATION: General appearance: alert, cooperative and no distress Head: Normocephalic, without obvious abnormality, atraumatic Neck: no adenopathy, no JVD, supple, symmetrical, trachea midline and thyroid not enlarged, symmetric, no tenderness/mass/nodules Lymph nodes: Cervical, supraclavicular, and axillary nodes normal. Resp: clear to auscultation bilaterally Back: symmetric, no curvature. ROM normal. No CVA tenderness. Cardio: regular rate and rhythm, S1, S2 normal, no murmur, click, rub or gallop GI: soft, non-tender; bowel sounds normal; no masses,  no organomegaly Extremities: extremities normal, atraumatic, no cyanosis or edema  ECOG  PERFORMANCE STATUS: 1 - Symptomatic but completely ambulatory  Blood pressure (!) 114/55, pulse 66, temperature 98.2 F (36.8 C), temperature source Oral, resp. rate 17, height 5\' 11"  (1.803 m), weight 201 lb 8 oz (91.4 kg), SpO2 100 %.  LABORATORY DATA: Lab Results  Component Value Date   WBC 3.7 (L) 04/03/2018   HGB 9.4 (L) 04/03/2018   HCT 27.7 (L) 04/03/2018   MCV 110.1 (H) 04/03/2018   PLT 232 04/03/2018      Chemistry      Component Value Date/Time   NA 140 03/13/2018 0947   NA 138 07/18/2017 1000   K 4.1 03/13/2018 0947   K 4.0 07/18/2017 1000   CL 104 03/13/2018 0947   CO2 27 03/13/2018 0947   CO2 25 07/18/2017 1000   BUN 23 03/13/2018 0947   BUN 14.9 07/18/2017 1000   CREATININE 1.08 03/13/2018 0947   CREATININE 0.8 07/18/2017 1000      Component Value Date/Time   CALCIUM 9.9 03/13/2018 0947   CALCIUM 9.7 07/18/2017 1000   ALKPHOS 100 03/13/2018 0947   ALKPHOS 93 07/18/2017 1000   AST 21 03/13/2018 0947   AST 22 07/18/2017 1000   ALT  14 03/13/2018 0947   ALT 24 07/18/2017 1000   BILITOT 0.4 03/13/2018 0947   BILITOT 0.54 07/18/2017 1000       RADIOGRAPHIC STUDIES: No results found.  ASSESSMENT AND PLAN:  This is a very pleasant 67 years old white male with recurrent non-small cell lung cancer initially diagnosed as stage IIIB non-small cell lung cancer favoring adenocarcinoma status post concurrent chemoradiation followed by consolidation chemotherapy. His last treatment was in September 2017. Restaging scan showed evidence for disease progression and the patient was a started on systemic chemotherapy with carboplatin, Alimta and Keytruda for 4 cycles.  His repeat imaging studies showed improvement of his disease.  He is currently undergoing maintenance treatment with Alimta and Keytruda status post 15 cycles. He has been tolerating this treatment well with no concerning adverse effects. I recommended for him to proceed with cycle #16 today as  scheduled. I will see him back for follow-up visit in 3 weeks for evaluation after repeating CT scan of the chest, abdomen and pelvis for restaging of his disease. For the increased lacrimation from treatment with Alimta, he will use over-the-counter Systane eyedrop. The patient was advised to call immediately if he has any concerning symptoms in the interval. The patient voices understanding of current disease status and treatment options and is in agreement with the current care plan. All questions were answered. The patient knows to call the clinic with any problems, questions or concerns. We can certainly see the patient much sooner if necessary.  Disclaimer: This note was dictated with voice recognition software. Similar sounding words can inadvertently be transcribed and may not be corrected upon review.

## 2018-04-08 ENCOUNTER — Other Ambulatory Visit: Payer: Self-pay | Admitting: Family Medicine

## 2018-04-08 ENCOUNTER — Other Ambulatory Visit: Payer: Self-pay | Admitting: Internal Medicine

## 2018-04-08 DIAGNOSIS — Z5112 Encounter for antineoplastic immunotherapy: Secondary | ICD-10-CM

## 2018-04-08 DIAGNOSIS — Z7189 Other specified counseling: Secondary | ICD-10-CM

## 2018-04-08 DIAGNOSIS — Z23 Encounter for immunization: Secondary | ICD-10-CM

## 2018-04-08 DIAGNOSIS — R5382 Chronic fatigue, unspecified: Secondary | ICD-10-CM

## 2018-04-08 DIAGNOSIS — C3432 Malignant neoplasm of lower lobe, left bronchus or lung: Secondary | ICD-10-CM

## 2018-04-09 ENCOUNTER — Other Ambulatory Visit: Payer: Self-pay | Admitting: Internal Medicine

## 2018-04-09 DIAGNOSIS — Z23 Encounter for immunization: Secondary | ICD-10-CM

## 2018-04-09 DIAGNOSIS — Z7189 Other specified counseling: Secondary | ICD-10-CM

## 2018-04-09 DIAGNOSIS — R5382 Chronic fatigue, unspecified: Secondary | ICD-10-CM

## 2018-04-09 DIAGNOSIS — C3432 Malignant neoplasm of lower lobe, left bronchus or lung: Secondary | ICD-10-CM

## 2018-04-09 DIAGNOSIS — Z5112 Encounter for antineoplastic immunotherapy: Secondary | ICD-10-CM

## 2018-04-22 ENCOUNTER — Ambulatory Visit (HOSPITAL_COMMUNITY)
Admission: RE | Admit: 2018-04-22 | Discharge: 2018-04-22 | Disposition: A | Discharge status: Home / Self Care | Payer: BLUE CROSS/BLUE SHIELD | Source: Ambulatory Visit | Attending: Internal Medicine | Admitting: Internal Medicine

## 2018-04-22 DIAGNOSIS — C3490 Malignant neoplasm of unspecified part of unspecified bronchus or lung: Secondary | ICD-10-CM | POA: Insufficient documentation

## 2018-04-22 MED ORDER — IOHEXOL 300 MG/ML  SOLN
100.0000 mL | Freq: Once | INTRAMUSCULAR | Status: AC | PRN
  Administered 2018-04-22: 100 mL via INTRAVENOUS

## 2018-04-22 MED ORDER — HEPARIN SOD (PORK) LOCK FLUSH 100 UNIT/ML IV SOLN
500.0000 [IU] | Freq: Once | INTRAVENOUS | Status: AC
  Administered 2018-04-22: 500 [IU] via INTRAVENOUS

## 2018-04-22 MED ORDER — SODIUM CHLORIDE 0.9 % IJ SOLN
INTRAMUSCULAR | Status: AC
  Filled 2018-04-22: qty 50

## 2018-04-22 MED ORDER — HEPARIN SOD (PORK) LOCK FLUSH 100 UNIT/ML IV SOLN
INTRAVENOUS | Status: AC
  Filled 2018-04-22: qty 5

## 2018-04-24 ENCOUNTER — Inpatient Hospital Stay: Payer: BLUE CROSS/BLUE SHIELD

## 2018-04-24 ENCOUNTER — Inpatient Hospital Stay: Payer: BLUE CROSS/BLUE SHIELD | Attending: Internal Medicine

## 2018-04-24 ENCOUNTER — Encounter: Payer: Self-pay | Admitting: Nurse Practitioner

## 2018-04-24 ENCOUNTER — Inpatient Hospital Stay (HOSPITAL_BASED_OUTPATIENT_CLINIC_OR_DEPARTMENT_OTHER): Payer: BLUE CROSS/BLUE SHIELD | Admitting: Nurse Practitioner

## 2018-04-24 VITALS — BP 118/60 | HR 62 | Temp 98.0°F | Resp 18 | Ht 71.0 in | Wt 197.6 lb

## 2018-04-24 DIAGNOSIS — Z5112 Encounter for antineoplastic immunotherapy: Secondary | ICD-10-CM | POA: Diagnosis not present

## 2018-04-24 DIAGNOSIS — Z5111 Encounter for antineoplastic chemotherapy: Secondary | ICD-10-CM | POA: Insufficient documentation

## 2018-04-24 DIAGNOSIS — R599 Enlarged lymph nodes, unspecified: Secondary | ICD-10-CM | POA: Diagnosis not present

## 2018-04-24 DIAGNOSIS — D649 Anemia, unspecified: Secondary | ICD-10-CM | POA: Insufficient documentation

## 2018-04-24 DIAGNOSIS — C3432 Malignant neoplasm of lower lobe, left bronchus or lung: Secondary | ICD-10-CM | POA: Diagnosis not present

## 2018-04-24 DIAGNOSIS — F329 Major depressive disorder, single episode, unspecified: Secondary | ICD-10-CM | POA: Diagnosis not present

## 2018-04-24 DIAGNOSIS — I4891 Unspecified atrial fibrillation: Secondary | ICD-10-CM | POA: Insufficient documentation

## 2018-04-24 DIAGNOSIS — R5383 Other fatigue: Secondary | ICD-10-CM

## 2018-04-24 DIAGNOSIS — R0609 Other forms of dyspnea: Secondary | ICD-10-CM

## 2018-04-24 LAB — CMP (CANCER CENTER ONLY)
ALT: 12 U/L (ref 0–44)
AST: 18 U/L (ref 15–41)
Albumin: 3.6 g/dL (ref 3.5–5.0)
Alkaline Phosphatase: 89 U/L (ref 38–126)
Anion gap: 9 (ref 5–15)
BUN: 21 mg/dL (ref 8–23)
CO2: 26 mmol/L (ref 22–32)
Calcium: 9.5 mg/dL (ref 8.9–10.3)
Chloride: 104 mmol/L (ref 98–111)
Creatinine: 1.08 mg/dL (ref 0.61–1.24)
GFR, Est AFR Am: >60 mL/min (ref >60)
GFR, Estimated: >60 mL/min (ref >60)
Glucose, Bld: 118 mg/dL — ABNORMAL HIGH (ref 70–99)
Potassium: 4.2 mmol/L (ref 3.5–5.1)
Sodium: 139 mmol/L (ref 135–145)
Total Bilirubin: 0.4 mg/dL (ref 0.3–1.2)
Total Protein: 7 g/dL (ref 6.5–8.1)

## 2018-04-24 LAB — CBC WITH DIFFERENTIAL (CANCER CENTER ONLY)
Abs Immature Granulocytes: 0.05 10*3/uL (ref 0.00–0.07)
Basophils Absolute: 0 10*3/uL (ref 0.0–0.1)
Basophils Relative: 0 %
Eosinophils Absolute: 0 10*3/uL (ref 0.0–0.5)
Eosinophils Relative: 0 %
HCT: 26.9 % — ABNORMAL LOW (ref 39.0–52.0)
Hemoglobin: 8.7 g/dL — ABNORMAL LOW (ref 13.0–17.0)
Immature Granulocytes: 1 %
Lymphocytes Relative: 12 %
Lymphs Abs: 0.5 10*3/uL — ABNORMAL LOW (ref 0.7–4.0)
MCH: 35.8 pg — ABNORMAL HIGH (ref 26.0–34.0)
MCHC: 32.3 g/dL (ref 30.0–36.0)
MCV: 110.7 fL — ABNORMAL HIGH (ref 80.0–100.0)
Monocytes Absolute: 0.5 10*3/uL (ref 0.1–1.0)
Monocytes Relative: 11 %
Neutro Abs: 3.5 10*3/uL (ref 1.7–7.7)
Neutrophils Relative %: 76 %
Platelet Count: 305 10*3/uL (ref 150–400)
RBC: 2.43 MIL/uL — ABNORMAL LOW (ref 4.22–5.81)
RDW: 14.9 % (ref 11.5–15.5)
WBC Count: 4.6 10*3/uL (ref 4.0–10.5)
nRBC: 0 % (ref 0.0–0.2)

## 2018-04-24 MED ORDER — PROCHLORPERAZINE MALEATE 10 MG PO TABS
ORAL_TABLET | ORAL | Status: AC
  Filled 2018-04-24: qty 1

## 2018-04-24 MED ORDER — SODIUM CHLORIDE 0.9% FLUSH
10.0000 mL | INTRAVENOUS | Status: DC | PRN
  Administered 2018-04-24: 10 mL via INTRAVENOUS
  Filled 2018-04-24: qty 10

## 2018-04-24 MED ORDER — PROCHLORPERAZINE MALEATE 10 MG PO TABS
10.0000 mg | ORAL_TABLET | Freq: Once | ORAL | Status: AC
  Administered 2018-04-24: 10 mg via ORAL

## 2018-04-24 MED ORDER — SODIUM CHLORIDE 0.9 % IV SOLN
500.0000 mg/m2 | Freq: Once | INTRAVENOUS | Status: AC
  Administered 2018-04-24: 1100 mg via INTRAVENOUS
  Filled 2018-04-24: qty 4

## 2018-04-24 MED ORDER — SODIUM CHLORIDE 0.9 % IV SOLN
200.0000 mg | Freq: Once | INTRAVENOUS | Status: AC
  Administered 2018-04-24: 200 mg via INTRAVENOUS
  Filled 2018-04-24: qty 8

## 2018-04-24 MED ORDER — MIRTAZAPINE 15 MG PO TABS: 15.0000 mg | ORAL_TABLET | Freq: Every day | ORAL | 0 refills | Status: DC

## 2018-04-24 MED ORDER — CYANOCOBALAMIN 1000 MCG/ML IJ SOLN
1000.0000 ug | Freq: Once | INTRAMUSCULAR | Status: AC
  Administered 2018-04-24: 1000 ug via INTRAMUSCULAR

## 2018-04-24 MED ORDER — CYANOCOBALAMIN 1000 MCG/ML IJ SOLN
INTRAMUSCULAR | Status: AC
  Filled 2018-04-24: qty 1

## 2018-04-24 MED ORDER — SODIUM CHLORIDE 0.9 % IV SOLN
Freq: Once | INTRAVENOUS | Status: AC
  Administered 2018-04-24: 13:00:00 via INTRAVENOUS
  Filled 2018-04-24: qty 250

## 2018-04-24 MED ORDER — HEPARIN SOD (PORK) LOCK FLUSH 100 UNIT/ML IV SOLN
500.0000 [IU] | Freq: Once | INTRAVENOUS | Status: AC | PRN
  Administered 2018-04-24: 500 [IU]
  Filled 2018-04-24: qty 5

## 2018-04-24 MED ORDER — SODIUM CHLORIDE 0.9% FLUSH
10.0000 mL | INTRAVENOUS | Status: DC | PRN
  Administered 2018-04-24: 10 mL
  Filled 2018-04-24: qty 10

## 2018-04-24 NOTE — Patient Instructions (Signed)
[facility] Discharge Instructions for Patients Receiving Chemotherapy  Today you received the following chemotherapy agents Keytruda, Alimta  To help prevent nausea and vomiting after your treatment, we encourage you to take your nausea medication as directed.    If you develop nausea and vomiting that is not controlled by your nausea medication, call the clinic.   BELOW ARE SYMPTOMS THAT SHOULD BE REPORTED IMMEDIATELY:  *FEVER GREATER THAN 100.5 F  *CHILLS WITH OR WITHOUT FEVER  NAUSEA AND VOMITING THAT IS NOT CONTROLLED WITH YOUR NAUSEA MEDICATION  *UNUSUAL SHORTNESS OF BREATH  *UNUSUAL BRUISING OR BLEEDING  TENDERNESS IN MOUTH AND THROAT WITH OR WITHOUT PRESENCE OF ULCERS  *URINARY PROBLEMS  *BOWEL PROBLEMS  UNUSUAL RASH Items with * indicate a potential emergency and should be followed up as soon as possible.  Feel free to call the clinic should you have any questions or concerns. The clinic phone number is [phone].  Please show the [facility] at check-in to the Emergency Department and triage nurse.

## 2018-04-24 NOTE — Progress Notes (Addendum)
[facility] OFFICE PROGRESS NOTE   DIAGNOSIS: Recurrent non-small cell lung cancer initially diagnosed as Stage IIIB (T1b, N3, M0) non-small cell lung cancer favoring adenocarcinoma, presented with left lower lobe pulmonary nodule in addition to left hilar and bilateral mediastinal lymphadenopathy in addition to right supraclavicular lymph nodes diagnosed April 2017.  PRIOR THERAPY: 1) Concurrent chemoradiation with weekly carboplatin for AUC of 2 and paclitaxel 45 mg/M2 started on 11/15/2015. He is status post 6 cycles. Last dose was getting 12/20/2015 with partial response. 2) Consolidation chemotherapy with carboplatin for AUC of 5 and paclitaxel 175 MG/M2 every 3 weeks with Neulasta support. Status post 3cycles. Last dose was given 04/03/2016.  CURRENT THERAPY: systemic chemotherapy with carboplatin for AUC of 5, Alimta 500 MG/M2 and Ketruda 200 MG IV every 3 weeks. First dose 05/16/2017.  Status post 16 cycle.  Starting from cycle #5 treated with maintenance Alimta and Keytruda only.   INTERVAL HISTORY:   Brian White returns for follow-up.  He completed cycle 16 Alimta/Keytruda on 04/03/2018.  He denies nausea/vomiting.  No mouth sores.  No diarrhea.  No rash.  He continues to note watery eyes.  No significant improvement with the eyedrops though he does not utilize consistently.  He has stable dyspnea on exertion.  No fever.  Occasional cough.  After the last 4 cycles around day 10 he reports developing upper abdominal pain.  This last 1-1/2 days.  He has tried Tums, Pepto-Bismol and Compazine with no improvement.  He is unable to eat during this time.  He denies any bleeding.  His wife wonders if he may benefit from an antidepressant.  Objective:  Vital signs in last 24 hours:  Blood pressure 118/60, pulse 62, temperature 98 F (36.7 C), temperature source Oral, resp. rate 18, height 5\' 11"  (1.803 m), weight 197 lb 9.6 oz (89.6 kg), SpO2 100 %.    HEENT: No thrush or  ulcers. Resp: Lungs clear bilaterally. Cardio: Regular rate and rhythm. GI: Abdomen soft and nontender.  No hepatomegaly. Vascular: No leg edema. Neuro: Alert and oriented.  Port-A-Cath without erythema.  Lab Results:  Lab Results  Component Value Date   WBC 4.6 04/24/2018   HGB 8.7 (L) 04/24/2018   HCT 26.9 (L) 04/24/2018   MCV 110.7 (H) 04/24/2018   PLT 305 04/24/2018   NEUTROABS 3.5 04/24/2018    Imaging:  Ct Chest W Contrast  Result Date: 04/23/2018 CLINICAL DATA:  Left-sided lung cancer. EXAM: CT CHEST, ABDOMEN, AND PELVIS WITH CONTRAST TECHNIQUE: Multidetector CT imaging of the chest, abdomen and pelvis was performed following the standard protocol during bolus administration of intravenous contrast. CONTRAST:  144mL OMNIPAQUE IOHEXOL 300 MG/ML  SOLN COMPARISON:  Chest CT 02/19/2018.  PET-CT 03/08/2017 FINDINGS: CT CHEST FINDINGS Cardiovascular: The heart size is normal. No substantial pericardial effusion. Coronary artery calcification is evident. Endocardial calcification in the left ventricle suggest prior infarct. Atherosclerotic calcification is noted in the wall of the thoracic aorta. Right Port-A-Cath tip is positioned in the proximal SVC. Mediastinum/Nodes: 2.4 x 2.9 cm AP window lymph node measured on the previous study measures 2.1 x 2.5 cm today. No other hilar mediastinal lymphadenopathy. There is no hilar lymphadenopathy. Soft tissue fullness in the inferior left hilum is stable. The esophagus has normal imaging features. There is no axillary lymphadenopathy. Lungs/Pleura: The central tracheobronchial airways are patent. Architectural distortion and scarring in the infrahilar left lower lobe is similar to prior with no change in the 1.1 x 1.9 cm nodular component seen  on image 99/series 4. No pleural effusion. No focal consolidation. Musculoskeletal: No worrisome lytic or sclerotic osseous abnormality. CT ABDOMEN PELVIS FINDINGS Hepatobiliary: No focal abnormality within  the liver parenchyma. Gallbladder nondistended which may account for the appearance of wall thickening. This is similar to prior study of 02/19/2018. No evidence for gallstones. No intrahepatic or extrahepatic biliary dilation. Pancreas: No focal mass lesion. No dilatation of the main duct. No intraparenchymal cyst. No peripancreatic edema. Spleen: No splenomegaly. No focal mass lesion. Adrenals/Urinary Tract: No adrenal nodule or mass. Right kidney unremarkable. 6 mm low-density cortical lesion left kidney is too small to characterize but is likely benign. No evidence for hydroureter. Nodular soft tissue at the bladder base is stable since prior PET-CT may be related to prostatic hypertrophy. Stomach/Bowel: Stomach is nondistended. No gastric wall thickening. No evidence of outlet obstruction. Duodenum is normally positioned as is the ligament of Treitz. No small bowel wall thickening. No small bowel dilatation. The terminal ileum is normal. The appendix is normal. No gross colonic mass. No colonic wall thickening. No substantial diverticular change. Vascular/Lymphatic: There is abdominal aortic atherosclerosis without aneurysm. 14 mm short axis gastrohepatic ligament lymph node is stable since 02/19/2018. Other borderline lymphadenopathy in the upper abdomen is similar. 14 mm para-aortic lymph node (77/2) has increased from 8 mm short axis on 03/08/2017. 16 mm short axis aortocaval lymph node (72/2) is new since 03/08/2017. Multiple other upper normal retroperitoneal lymph nodes are suspicious. No pelvic sidewall lymphadenopathy. Reproductive: Prostate gland is enlarged.  Fiducial markers evident. Other: No intraperitoneal free fluid. Musculoskeletal: Bilateral groin hernias contain only fat. No worrisome lytic or sclerotic osseous abnormality. IMPRESSION: 1. Interval progression of AP window disease now measuring up to 2.9 cm. 2. Stable gastrohepatic ligament lymphadenopathy since 02/19/2018, but new since  03/08/2017. Retroperitoneal lymphadenopathy was not included on the chest CT of 02/19/2018 but is new since PET-CT of 03/08/2017. 3. Stable infrahilar left lung opacity, likely treatment related. 4. Soft tissue nodularity associated with the bladder base likely related to prostatic enlargement but urothelial lesion cannot be completely excluded by CT. Electronically Signed   By: Misty Stanley M.D.   On: 04/23/2018 09:34   Ct Abdomen Pelvis W Contrast  Result Date: 04/23/2018 CLINICAL DATA:  Left-sided lung cancer. EXAM: CT CHEST, ABDOMEN, AND PELVIS WITH CONTRAST TECHNIQUE: Multidetector CT imaging of the chest, abdomen and pelvis was performed following the standard protocol during bolus administration of intravenous contrast. CONTRAST:  125mL OMNIPAQUE IOHEXOL 300 MG/ML  SOLN COMPARISON:  Chest CT 02/19/2018.  PET-CT 03/08/2017 FINDINGS: CT CHEST FINDINGS Cardiovascular: The heart size is normal. No substantial pericardial effusion. Coronary artery calcification is evident. Endocardial calcification in the left ventricle suggest prior infarct. Atherosclerotic calcification is noted in the wall of the thoracic aorta. Right Port-A-Cath tip is positioned in the proximal SVC. Mediastinum/Nodes: 2.4 x 2.9 cm AP window lymph node measured on the previous study measures 2.1 x 2.5 cm today. No other hilar mediastinal lymphadenopathy. There is no hilar lymphadenopathy. Soft tissue fullness in the inferior left hilum is stable. The esophagus has normal imaging features. There is no axillary lymphadenopathy. Lungs/Pleura: The central tracheobronchial airways are patent. Architectural distortion and scarring in the infrahilar left lower lobe is similar to prior with no change in the 1.1 x 1.9 cm nodular component seen on image 99/series 4. No pleural effusion. No focal consolidation. Musculoskeletal: No worrisome lytic or sclerotic osseous abnormality. CT ABDOMEN PELVIS FINDINGS Hepatobiliary: No focal abnormality within  the liver parenchyma. Gallbladder  nondistended which may account for the appearance of wall thickening. This is similar to prior study of 02/19/2018. No evidence for gallstones. No intrahepatic or extrahepatic biliary dilation. Pancreas: No focal mass lesion. No dilatation of the main duct. No intraparenchymal cyst. No peripancreatic edema. Spleen: No splenomegaly. No focal mass lesion. Adrenals/Urinary Tract: No adrenal nodule or mass. Right kidney unremarkable. 6 mm low-density cortical lesion left kidney is too small to characterize but is likely benign. No evidence for hydroureter. Nodular soft tissue at the bladder base is stable since prior PET-CT may be related to prostatic hypertrophy. Stomach/Bowel: Stomach is nondistended. No gastric wall thickening. No evidence of outlet obstruction. Duodenum is normally positioned as is the ligament of Treitz. No small bowel wall thickening. No small bowel dilatation. The terminal ileum is normal. The appendix is normal. No gross colonic mass. No colonic wall thickening. No substantial diverticular change. Vascular/Lymphatic: There is abdominal aortic atherosclerosis without aneurysm. 14 mm short axis gastrohepatic ligament lymph node is stable since 02/19/2018. Other borderline lymphadenopathy in the upper abdomen is similar. 14 mm para-aortic lymph node (77/2) has increased from 8 mm short axis on 03/08/2017. 16 mm short axis aortocaval lymph node (72/2) is new since 03/08/2017. Multiple other upper normal retroperitoneal lymph nodes are suspicious. No pelvic sidewall lymphadenopathy. Reproductive: Prostate gland is enlarged.  Fiducial markers evident. Other: No intraperitoneal free fluid. Musculoskeletal: Bilateral groin hernias contain only fat. No worrisome lytic or sclerotic osseous abnormality. IMPRESSION: 1. Interval progression of AP window disease now measuring up to 2.9 cm. 2. Stable gastrohepatic ligament lymphadenopathy since 02/19/2018, but new since  03/08/2017. Retroperitoneal lymphadenopathy was not included on the chest CT of 02/19/2018 but is new since PET-CT of 03/08/2017. 3. Stable infrahilar left lung opacity, likely treatment related. 4. Soft tissue nodularity associated with the bladder base likely related to prostatic enlargement but urothelial lesion cannot be completely excluded by CT. Electronically Signed   By: Misty Stanley M.D.   On: 04/23/2018 09:34    Medications: I have reviewed the patient's current medications.  Assessment/Plan: 1. Recurrent non-small cell lung cancer initially diagnosed as stage IIIb non-small cell lung cancer favoring adenocarcinoma status post concurrent chemoradiation followed by consolidation chemotherapy.  Treated with carboplatin/Alimta/Keytruda beginning 05/16/2017.  From cycle 5 being treated with maintenance Alimta and Keytruda only.  Cycle 16 completed 04/03/2018.  Disposition: Brian White appears stable.  He completed cycle 16 Alimta/Keytruda 04/03/2018.  Recent restaging CTs show mild progression of an AP window lymph node.  Dr. Julien Nordmann reviewed the report/images with Brian White and his wife at today's visit and recommends continuation of maintenance Alimta/Keytruda with attention to follow-up of this lymph node at time of next restaging CT evaluation.  They are in agreement with this plan.  He will complete cycle 17 today as scheduled.  Etiology of abdominal pain about 10 days after treatment unclear.  We reviewed the CBC from today.  He has progressive anemia.  He will complete a set of stool cards.  For the depression symptoms he will begin Remeron 15 mg at bedtime.  He will return for lab, follow-up and the next cycle of Alimta/Keytruda in 3 weeks.  He will contact the office in the interim with any problems.  Patient seen with Dr. Julien Nordmann.  Ned Card ANP/GNP-BC   04/24/2018  11:48 AM  ADDENDUM: Hematology/Oncology Attending: I had a face-to-face encounter with the patient today.  I  recommended his care plan.  This is a very pleasant 67 years old white male  with recurrent non-small cell lung cancer status post 4 cycles of induction systemic chemotherapy with carboplatin, Alimta and Ketruda (pembrolizumab).  He is currently undergoing maintenance treatment with Alimta and Ketruda (pembrolizumab) status post 12 cycles.  The patient has been tolerating this treatment well with no concerning adverse effect except for mild fatigue and depression. He had repeat CT scan of the chest, abdomen and pelvis performed recently.  I personally and independently reviewed the scan images and discussed the results with the patient and his wife and showed them the images.  His a scan showed stable disease except for mild progression of an AP window lymph node. I recommended for the patient to continue his current treatment with maintenance Alimta and Ketruda (pembrolizumab) for now.  We will continue to monitor the AP window lymph node closely on upcoming imaging studies. For the depression and fatigue, we started the patient on Remeron 15 mg p.o. nightly. For the progressive anemia, we will check his stool for Hemoccult.  The patient was advised to continue on oral iron tablets. He will come back for follow-up visit in 3 weeks for evaluation before the next cycle of his treatment. The patient was advised to call immediately if he has any concerning symptoms in the interval.  Disclaimer: This note was dictated with voice recognition software. Similar sounding words can inadvertently be transcribed and may be missed upon review. Eilleen Kempf, MD 04/24/18

## 2018-05-01 ENCOUNTER — Emergency Department (HOSPITAL_COMMUNITY): Payer: BLUE CROSS/BLUE SHIELD

## 2018-05-01 ENCOUNTER — Other Ambulatory Visit: Payer: Self-pay

## 2018-05-01 ENCOUNTER — Inpatient Hospital Stay (HOSPITAL_COMMUNITY)
Admission: EM | Admit: 2018-05-01 | Discharge: 2018-05-04 | DRG: 308 | Disposition: A | Discharge status: Home / Self Care | Payer: BLUE CROSS/BLUE SHIELD | Attending: Internal Medicine | Admitting: Internal Medicine

## 2018-05-01 ENCOUNTER — Encounter: Payer: Self-pay | Admitting: Family Medicine

## 2018-05-01 ENCOUNTER — Ambulatory Visit (INDEPENDENT_AMBULATORY_CARE_PROVIDER_SITE_OTHER): Payer: BLUE CROSS/BLUE SHIELD | Admitting: Family Medicine

## 2018-05-01 ENCOUNTER — Encounter (HOSPITAL_COMMUNITY): Payer: Self-pay | Admitting: *Deleted

## 2018-05-01 ENCOUNTER — Other Ambulatory Visit (HOSPITAL_COMMUNITY): Payer: Self-pay

## 2018-05-01 VITALS — BP 126/62 | HR 145 | Temp 97.8°F | Ht 69.5 in | Wt 193.5 lb

## 2018-05-01 DIAGNOSIS — Z923 Personal history of irradiation: Secondary | ICD-10-CM

## 2018-05-01 DIAGNOSIS — T451X5A Adverse effect of antineoplastic and immunosuppressive drugs, initial encounter: Secondary | ICD-10-CM | POA: Diagnosis present

## 2018-05-01 DIAGNOSIS — C3492 Malignant neoplasm of unspecified part of left bronchus or lung: Secondary | ICD-10-CM | POA: Diagnosis not present

## 2018-05-01 DIAGNOSIS — E785 Hyperlipidemia, unspecified: Secondary | ICD-10-CM | POA: Diagnosis present

## 2018-05-01 DIAGNOSIS — I10 Essential (primary) hypertension: Secondary | ICD-10-CM | POA: Diagnosis not present

## 2018-05-01 DIAGNOSIS — Z9221 Personal history of antineoplastic chemotherapy: Secondary | ICD-10-CM | POA: Diagnosis not present

## 2018-05-01 DIAGNOSIS — F17200 Nicotine dependence, unspecified, uncomplicated: Secondary | ICD-10-CM

## 2018-05-01 DIAGNOSIS — I2510 Atherosclerotic heart disease of native coronary artery without angina pectoris: Secondary | ICD-10-CM | POA: Diagnosis present

## 2018-05-01 DIAGNOSIS — Z23 Encounter for immunization: Secondary | ICD-10-CM | POA: Diagnosis not present

## 2018-05-01 DIAGNOSIS — I340 Nonrheumatic mitral (valve) insufficiency: Secondary | ICD-10-CM | POA: Diagnosis not present

## 2018-05-01 DIAGNOSIS — Z825 Family history of asthma and other chronic lower respiratory diseases: Secondary | ICD-10-CM | POA: Diagnosis not present

## 2018-05-01 DIAGNOSIS — K621 Rectal polyp: Secondary | ICD-10-CM

## 2018-05-01 DIAGNOSIS — Z79899 Other long term (current) drug therapy: Secondary | ICD-10-CM

## 2018-05-01 DIAGNOSIS — J449 Chronic obstructive pulmonary disease, unspecified: Secondary | ICD-10-CM | POA: Diagnosis present

## 2018-05-01 DIAGNOSIS — F17210 Nicotine dependence, cigarettes, uncomplicated: Secondary | ICD-10-CM | POA: Diagnosis present

## 2018-05-01 DIAGNOSIS — Z7951 Long term (current) use of inhaled steroids: Secondary | ICD-10-CM

## 2018-05-01 DIAGNOSIS — Z7982 Long term (current) use of aspirin: Secondary | ICD-10-CM

## 2018-05-01 DIAGNOSIS — Z8546 Personal history of malignant neoplasm of prostate: Secondary | ICD-10-CM | POA: Diagnosis not present

## 2018-05-01 DIAGNOSIS — F329 Major depressive disorder, single episode, unspecified: Secondary | ICD-10-CM | POA: Diagnosis present

## 2018-05-01 DIAGNOSIS — D61810 Antineoplastic chemotherapy induced pancytopenia: Secondary | ICD-10-CM | POA: Diagnosis present

## 2018-05-01 DIAGNOSIS — D649 Anemia, unspecified: Secondary | ICD-10-CM | POA: Insufficient documentation

## 2018-05-01 DIAGNOSIS — I252 Old myocardial infarction: Secondary | ICD-10-CM

## 2018-05-01 DIAGNOSIS — R000 Tachycardia, unspecified: Secondary | ICD-10-CM | POA: Diagnosis not present

## 2018-05-01 DIAGNOSIS — I440 Atrioventricular block, first degree: Secondary | ICD-10-CM | POA: Diagnosis present

## 2018-05-01 DIAGNOSIS — C3432 Malignant neoplasm of lower lobe, left bronchus or lung: Secondary | ICD-10-CM | POA: Diagnosis present

## 2018-05-01 DIAGNOSIS — K5900 Constipation, unspecified: Secondary | ICD-10-CM | POA: Diagnosis present

## 2018-05-01 DIAGNOSIS — J438 Other emphysema: Secondary | ICD-10-CM | POA: Diagnosis not present

## 2018-05-01 DIAGNOSIS — I4892 Unspecified atrial flutter: Secondary | ICD-10-CM | POA: Diagnosis not present

## 2018-05-01 DIAGNOSIS — I420 Dilated cardiomyopathy: Secondary | ICD-10-CM | POA: Diagnosis not present

## 2018-05-01 DIAGNOSIS — D61818 Other pancytopenia: Secondary | ICD-10-CM | POA: Diagnosis not present

## 2018-05-01 DIAGNOSIS — I483 Typical atrial flutter: Secondary | ICD-10-CM | POA: Diagnosis not present

## 2018-05-01 DIAGNOSIS — Z8249 Family history of ischemic heart disease and other diseases of the circulatory system: Secondary | ICD-10-CM | POA: Diagnosis not present

## 2018-05-01 DIAGNOSIS — R7989 Other specified abnormal findings of blood chemistry: Secondary | ICD-10-CM

## 2018-05-01 DIAGNOSIS — G4700 Insomnia, unspecified: Secondary | ICD-10-CM | POA: Diagnosis present

## 2018-05-01 DIAGNOSIS — Z85118 Personal history of other malignant neoplasm of bronchus and lung: Secondary | ICD-10-CM

## 2018-05-01 DIAGNOSIS — I429 Cardiomyopathy, unspecified: Secondary | ICD-10-CM | POA: Diagnosis not present

## 2018-05-01 LAB — BASIC METABOLIC PANEL
Anion gap: 11 (ref 5–15)
BUN: 29 mg/dL — ABNORMAL HIGH (ref 8–23)
CO2: 23 mmol/L (ref 22–32)
Calcium: 9.3 mg/dL (ref 8.9–10.3)
Chloride: 103 mmol/L (ref 98–111)
Creatinine, Ser: 0.96 mg/dL (ref 0.61–1.24)
GFR calc Af Amer: >60 mL/min (ref >60)
GFR calc non Af Amer: >60 mL/min (ref >60)
Glucose, Bld: 125 mg/dL — ABNORMAL HIGH (ref 70–99)
Potassium: 4 mmol/L (ref 3.5–5.1)
Sodium: 137 mmol/L (ref 135–145)

## 2018-05-01 LAB — I-STAT TROPONIN, ED: Troponin i, poc: 0.06 ng/mL (ref 0.00–0.08)

## 2018-05-01 LAB — CBC
HCT: 29.9 % — ABNORMAL LOW (ref 39.0–52.0)
Hemoglobin: 9.3 g/dL — ABNORMAL LOW (ref 13.0–17.0)
MCH: 34.7 pg — ABNORMAL HIGH (ref 26.0–34.0)
MCHC: 31.1 g/dL (ref 30.0–36.0)
MCV: 111.6 fL — ABNORMAL HIGH (ref 80.0–100.0)
Platelets: 165 10*3/uL (ref 150–400)
RBC: 2.68 MIL/uL — ABNORMAL LOW (ref 4.22–5.81)
RDW: 14.6 % (ref 11.5–15.5)
WBC: 2.5 10*3/uL — ABNORMAL LOW (ref 4.0–10.5)
nRBC: 0 % (ref 0.0–0.2)

## 2018-05-01 LAB — TSH: TSH: 0.913 u[IU]/mL (ref 0.350–4.500)

## 2018-05-01 LAB — T4, FREE: Free T4: 1.3 ng/dL (ref 0.82–1.77)

## 2018-05-01 MED ORDER — METOPROLOL TARTRATE 5 MG/5ML IV SOLN
2.5000 mg | Freq: Once | INTRAVENOUS | Status: AC
  Administered 2018-05-01: 2.5 mg via INTRAVENOUS
  Filled 2018-05-01: qty 5

## 2018-05-01 MED ORDER — MOMETASONE FURO-FORMOTEROL FUM 200-5 MCG/ACT IN AERO
2.0000 | INHALATION_SPRAY | Freq: Two times a day (BID) | RESPIRATORY_TRACT | Status: DC
  Administered 2018-05-01 – 2018-05-04 (×6): 2 via RESPIRATORY_TRACT
  Filled 2018-05-01: qty 8.8

## 2018-05-01 MED ORDER — DILTIAZEM HCL-DEXTROSE 100-5 MG/100ML-% IV SOLN (PREMIX)
10.0000 mg/h | INTRAVENOUS | Status: DC
  Administered 2018-05-01: 5 mg/h via INTRAVENOUS
  Administered 2018-05-02 (×2): 10 mg/h via INTRAVENOUS
  Filled 2018-05-01 (×4): qty 100

## 2018-05-01 MED ORDER — SODIUM CHLORIDE 0.9% FLUSH
10.0000 mL | INTRAVENOUS | Status: DC | PRN
  Administered 2018-05-02: 20 mL
  Filled 2018-05-01: qty 40

## 2018-05-01 MED ORDER — SODIUM CHLORIDE 0.9 % IV BOLUS
1000.0000 mL | Freq: Once | INTRAVENOUS | Status: AC
  Administered 2018-05-01: 1000 mL via INTRAVENOUS

## 2018-05-01 MED ORDER — ACETAMINOPHEN 325 MG PO TABS: 650.0000 mg | ORAL_TABLET | ORAL | Status: DC | PRN

## 2018-05-01 MED ORDER — ENOXAPARIN SODIUM 100 MG/ML ~~LOC~~ SOLN
90.0000 mg | Freq: Two times a day (BID) | SUBCUTANEOUS | Status: DC
  Filled 2018-05-01: qty 0.9
  Filled 2018-05-01: qty 1

## 2018-05-01 MED ORDER — DILTIAZEM HCL-DEXTROSE 100-5 MG/100ML-% IV SOLN (PREMIX): 5.0000 mg/h | INTRAVENOUS | Status: DC

## 2018-05-01 MED ORDER — DILTIAZEM LOAD VIA INFUSION
10.0000 mg | Freq: Once | INTRAVENOUS | Status: AC
  Administered 2018-05-01: 10 mg via INTRAVENOUS
  Filled 2018-05-01: qty 10

## 2018-05-01 MED ORDER — FOLIC ACID 1 MG PO TABS
1.0000 mg | ORAL_TABLET | Freq: Every day | ORAL | Status: DC
  Administered 2018-05-02 – 2018-05-04 (×3): 1 mg via ORAL
  Filled 2018-05-01 (×3): qty 1

## 2018-05-01 MED ORDER — ASPIRIN EC 81 MG PO TBEC
81.0000 mg | DELAYED_RELEASE_TABLET | Freq: Every day | ORAL | Status: DC
  Administered 2018-05-02: 81 mg via ORAL
  Filled 2018-05-01: qty 1

## 2018-05-01 MED ORDER — MIRTAZAPINE 15 MG PO TABS
15.0000 mg | ORAL_TABLET | Freq: Every day | ORAL | Status: DC
  Administered 2018-05-01 – 2018-05-03 (×3): 15 mg via ORAL
  Filled 2018-05-01 (×3): qty 1

## 2018-05-01 MED ORDER — GUAIFENESIN ER 600 MG PO TB12: 600.0000 mg | ORAL_TABLET | Freq: Two times a day (BID) | ORAL | Status: DC | PRN

## 2018-05-01 MED ORDER — DIGOXIN 0.25 MG/ML IJ SOLN: 0.5000 mg | Freq: Once | INTRAMUSCULAR | Status: DC

## 2018-05-01 MED ORDER — SODIUM CHLORIDE 0.9 % IV SOLN
INTRAVENOUS | Status: DC
  Administered 2018-05-01 – 2018-05-02 (×4): via INTRAVENOUS

## 2018-05-01 MED ORDER — ONDANSETRON HCL 4 MG/2ML IJ SOLN: 4.0000 mg | Freq: Four times a day (QID) | INTRAMUSCULAR | Status: DC | PRN

## 2018-05-01 NOTE — ED Notes (Signed)
Repeat EKG given to EDP

## 2018-05-01 NOTE — Assessment & Plan Note (Addendum)
New asymptomatic tachycardia concern for atrial flutter in h/o RBBB and LAFB. Has not previously seen cards. Has several possible reasons for this - untreated hyperthyroidism, progressive anemia, ?PE. Discussed with patient - safest place for further evaluation is in the ER - he will go there now. We called [facility] ER to notify pt is on his way. I did touch base with cardiology at [facility] regarding patient.

## 2018-05-01 NOTE — ED Notes (Signed)
Second EKG captured (HR <110), given to MD.

## 2018-05-01 NOTE — ED Notes (Signed)
Pt's HR down from 140s to 110

## 2018-05-01 NOTE — ED Triage Notes (Signed)
Pt in from his MD office, went for a check up and was found to be in afib vs a flutter, sent here for further evaluation, denies pain, reports shortness of breath but that is not new for him

## 2018-05-01 NOTE — ED Notes (Signed)
Patient ambulatory to bathroom with steady gait at this time to provide urine sample

## 2018-05-01 NOTE — Assessment & Plan Note (Signed)
Ongoing for the past year - ?immune mediated from [place]. This will need further evaluation - may be contributing.

## 2018-05-01 NOTE — Progress Notes (Signed)
Patient's Heart Rate was in the 140's Patient was asymptomatic and no complaints of sob or dizziness. MD was paged. Will continue to monitor.

## 2018-05-01 NOTE — Assessment & Plan Note (Addendum)
Progressive - will need reassessed as may be contributing. He has pending stool cards.

## 2018-05-01 NOTE — ED Provider Notes (Signed)
[name] Provider Note   CSN: [id number] Arrival date & time: 05/01/18  1158     History   Chief Complaint Chief Complaint  Patient presents with  . Palpitations    HPI Brian White is a 67 y.o. male.  HPI  67 year old male with a history of adenocarcinoma of the lung currently being treated with chemotherapy presents with asymptomatic tachycardia.  He was sent over by his PCPs office after he was found to have tachycardia on vital sign check.  Patient states he was therefore a routine checkup and has been feeling okay.  He is been having fatigue and dyspnea ever since being diagnosed with cancer 2 years ago but no new symptoms recently.  No fevers, palpitations, or lightheadedness.  He had 2 episodes of diarrhea yesterday but no vomiting or other symptoms.  Currently feels pretty good.  Past Medical History:  Diagnosis Date  . Adenocarcinoma, lung ([place]) 10/08/2015   2.4cm spiculated LLL nodule suspicious for primary bronchogenic carcinoma by CT 09/2015 - adenocarcinoma by endobronchial biopsy  . Allergy   . Arthritis   . CAD (coronary artery disease)    mild MI; pt unaware - seen on stress test  . COPD (chronic obstructive pulmonary disease) ([place]) 06/26/2011   Mild centrilobular and paraseptal emphysema by CT 09/2015   . ED (erectile dysfunction)   . H/O anaphylactic shock 2013   hives,hypotension,syncope  . H/O seasonal allergies   . Herpes zoster 12/06/2015  . Hypertension   . Polycythemia 01/25/2014  . Prostate cancer [facility]) 2013   s/p radiation therapy March 2013, planned f/u with uro after XRT  . Radiation 09/18/2011 through 11/10/2011   Prostate 7600 cGy 40 sessions, seminal vesicles 5600 cGy 40 sessions    . Radiation 11/10/15-12/22/15   chest 60 Gy  . Shortness of breath dyspnea   . Smoker   . Thoracic aorta atherosclerosis ([employer]) 10/08/2015   Mild by CT 09/2015     Patient Active Problem List   Diagnosis  Date Noted  . Tachycardia 05/01/2018  . Low serum thyroid stimulating hormone (TSH) 05/01/2018  . Anemia 05/01/2018  . Itching due to drug 12/19/2017  . Excessive lacrimation 11/07/2017  . Chronic fatigue 05/08/2017  . Encounter for antineoplastic immunotherapy 05/08/2017  . Goals of care, counseling/discussion 05/08/2017  . Rectal polyp 02/28/2017  . Encounter for antineoplastic chemotherapy 12/20/2015  . Herpes zoster 12/06/2015  . Cancer of lower lobe of left lung ([place]) 10/28/2015  . Lung nodule < 6cm on CT   . Thoracic aorta atherosclerosis ([place]) 10/08/2015  . Advanced care planning/counseling discussion 08/27/2015  . Health care maintenance 01/29/2014  . Polycythemia 01/25/2014  . HLD (hyperlipidemia) 01/25/2014  . Medicare annual wellness visit, subsequent 11/22/2012  . H/O seasonal allergies   . Smoker   . CAD (coronary artery disease)   . ED (erectile dysfunction)   . Prostate cancer ([place]) 08/02/2011  . HTN (hypertension) 06/26/2011  . COPD (chronic obstructive pulmonary disease) ([place]) 06/26/2011    Past Surgical History:  Procedure Laterality Date  . COLONOSCOPY W/ BIOPSIES AND POLYPECTOMY  10/11/15   mult TAs, 3cm rectal polypoid lesion; rec further treatment, diverticulosis ([place])  . ENDOBRONCHIAL ULTRASOUND Bilateral 10/18/2015   Procedure: ENDOBRONCHIAL ULTRASOUND;  Surgeon: Javier Glazier, MD;  Location: [facility] ENDOSCOPY;  Service: Cardiopulmonary;  Laterality: Bilateral;  . HERNIA REPAIR Right 2005   with mesh  . INSERTION PROSTATE RADIATION SEED  08/2011   prostate cancer, [place]  . IR  FLUORO GUIDE PORT INSERTION RIGHT  07/26/2017  . IR US GUIDE VASC ACCESS RIGHT  07/26/2017        Home Medications    Prior to Admission medications   Medication Sig Start Date End Date Taking? Authorizing Provider  aspirin 81 MG tablet Take 81 mg by mouth daily. Reported on 12/21/2015    [provider]  budesonide-formoterol (SYMBICORT) 160-4.5 MCG/ACT  inhaler Inhale 1 puff into the lungs 2 (two) times daily. 02/28/17   [name], MD  cetirizine (ZYRTEC) 10 MG chewable tablet Chew 10 mg by mouth daily. 02/19/17   [provider]  dexamethasone (DECADRON) 4 MG tablet 4 mg by mouth twice a day the day before, day of and after the chemotherapy every 3 weeks. 04/03/18   [name], MD  diphenhydrAMINE (BENADRYL) 25 MG tablet Take 25 mg by mouth at bedtime as needed for sleep.    [provider]  folic acid (FOLVITE) 1 MG tablet TAKE 1 TABLET(1 MG) BY MOUTH DAILY 04/03/18   [name], MD  folic acid (FOLVITE) 1 MG tablet TAKE 1 TABLET(1 MG) BY MOUTH DAILY 04/09/18   [name], MD  guaiFENesin (MUCINEX) 600 MG 12 hr tablet Take by mouth 2 (two) times daily.    [provider]  hydrOXYzine (ATARAX/VISTARIL) 10 MG tablet Take 1 tablet (10 mg total) by mouth 3 (three) times daily as needed. 01/30/18   Maryanna Shape, NP  ibuprofen (ADVIL,MOTRIN) 200 MG tablet Take 600 mg by mouth every 6 (six) hours as needed (Pain). Reported on 12/21/2015    [provider]  lidocaine-prilocaine (EMLA) cream Apply 1 application topically as needed. Squeeze a  small amount of cream to cotton ball and apply to port site 1-2 hours prior to chemotherapy. Do not rub in the cream and cover  with plastic wrap. 07/30/17   [name], MD  losartan-hydrochlorothiazide [facility]) 100-25 MG tablet TAKE 1 TABLET BY MOUTH EVERY DAY 04/09/18   [name], MD  mirtazapine (REMERON) 15 MG tablet Take 1 tablet (15 mg total) by mouth at bedtime. 04/24/18   Owens Shark, NP  prochlorperazine (COMPAZINE) 10 MG tablet Take 1 tablet (10 mg total) by mouth every 6 (six) hours as needed for nausea or vomiting. 05/08/17   [name], MD    Family History Family History  Problem Relation Age of Onset  . Asthma Father   . COPD Father   . CAD Father        possibly  . Emphysema Father   . Breast cancer Sister   .  Hypertension Mother   . Diabetes Neg Hx   . Stroke Neg Hx   . Colon cancer Neg Hx   . Colon polyps Neg Hx   . Rectal cancer Neg Hx   . Stomach cancer Neg Hx     Social History Social History   Tobacco Use  . Smoking status: Light Tobacco Smoker    Packs/day: 1.00    Years: 49.00    Pack years: 49.00    Types: Cigarettes    Start date: 01/16/1967    Last attempt to quit: 11/06/2015    Years since quitting: 2.4  . Smokeless tobacco: Never Used  . Tobacco comment: Peak rate of 2ppd  Substance Use Topics  . Alcohol use: Yes    Alcohol/week: 1.0 standard drinks    Types: 1 Shots of liquor per week    Comment: 2-3 beers or mixed drinks daily    .  Drug use: Yes    Types: Marijuana    Comment: marijuana occasional weekly     Allergies   Onion   Review of Systems Review of Systems  Constitutional: Positive for fatigue. Negative for fever.  Respiratory: Positive for shortness of breath.   Cardiovascular: Negative for chest pain and palpitations.  Gastrointestinal: Positive for diarrhea. Negative for abdominal pain and vomiting.  Neurological: Negative for light-headedness.  All other systems reviewed and are negative.    Physical Exam Updated Vital Signs BP 105/78 (BP Location: Right Arm)   Pulse (!) 128   Temp 98 F (36.7 C) (Oral)   Resp 20   SpO2 100%   Physical Exam  Constitutional: He appears well-developed and well-nourished. No distress.  HENT:  Head: Normocephalic and atraumatic.  Right Ear: External ear normal.  Left Ear: External ear normal.  Nose: Nose normal.  Eyes: Right eye exhibits no discharge. Left eye exhibits no discharge.  Neck: Neck supple.  Cardiovascular: Regular rhythm and normal heart sounds. Tachycardia present.  Pulmonary/Chest: Effort normal and breath sounds normal.  Right chest port  Abdominal: Soft. There is no tenderness.  Musculoskeletal: He exhibits no edema.  Neurological: He is alert.  Skin: Skin is warm and dry. He is  not diaphoretic.  Psychiatric: His mood appears not anxious.  Nursing note and vitals reviewed.    ED Treatments / Results  Labs (all labs ordered are listed, but only abnormal results are displayed) Labs Reviewed  BASIC METABOLIC PANEL - Abnormal; Notable for the following components:      Result Value   Glucose, Bld 125 (*)    BUN 29 (*)    All other components within normal limits  CBC - Abnormal; Notable for the following components:   WBC 2.5 (*)    RBC 2.68 (*)    Hemoglobin 9.3 (*)    HCT 29.9 (*)    MCV 111.6 (*)    MCH 34.7 (*)    All other components within normal limits  TSH  T4, FREE  HIV ANTIBODY (ROUTINE TESTING W REFLEX)  BASIC METABOLIC PANEL  CBC  PROTIME-INR  APTT  TROPONIN I  TROPONIN I  I-STAT TROPONIN, ED    EKG EKG Interpretation  Date/Time:  Wednesday May 01 2018 12:01:50 EDT Ventricular Rate:  147 PR Interval:  142 QRS Duration: 126 QT Interval:  258 QTC Calculation: 403 R Axis:   -35 Text Interpretation:  atrial flutter vs sinus tachycardia Left axis deviation Non-specific intra-ventricular conduction block T wave abnormality, consider lateral ischemia Abnormal ECG Reconfirmed by Sherwood Gambler [phone]) on 05/01/2018 12:17:42 PM   Radiology Dg Chest 2 View  Result Date: 05/01/2018 CLINICAL DATA:  Palpitations, chest pain. EXAM: CHEST - 2 VIEW COMPARISON:  Radiograph of October 18, 2015. FINDINGS: Stable cardiomediastinal silhouette. No pneumothorax or pleural effusion is noted. Interval placement of right internal jugular Port-A-Cath with distal tip in expected position of superior vena cava. Both lungs are clear. The visualized skeletal structures are unremarkable. IMPRESSION: Interval placement of right internal jugular Port-A-Cath. No acute cardiopulmonary abnormality seen. Electronically Signed   By: Marijo Conception, M.D.   On: 05/01/2018 12:55    Procedures .Critical Care Performed by: Sherwood Gambler, MD Authorized by:  Sherwood Gambler, MD   Critical care provider statement:    Critical care time (minutes):  30   Critical care time was exclusive of:  Separately billable procedures and treating other patients   Critical care was necessary to treat or prevent  imminent or life-threatening deterioration of the following conditions:  Cardiac failure and circulatory failure   Critical care was time spent personally by me on the following activities:  Development of treatment plan with patient or surrogate, discussions with consultants, evaluation of patient's response to treatment, examination of patient, obtaining history from patient or surrogate, ordering and performing treatments and interventions, ordering and review of laboratory studies, ordering and review of radiographic studies, pulse oximetry, re-evaluation of patient's condition and review of old charts   (including critical care time)  Medications Ordered in ED Medications  diltiazem (CARDIZEM) 1 mg/mL load via infusion 10 mg (10 mg Intravenous Bolus from Bag 05/01/18 1706)    And  diltiazem (CARDIZEM) 100 mg in dextrose 5% 169mL (1 mg/mL) infusion (5 mg/hr Intravenous Transfusing/Transfer 05/01/18 1832)  aspirin EC tablet 81 mg (has no administration in time range)  mirtazapine (REMERON) tablet 15 mg (15 mg Oral Given 16/10/96 0454)  folic acid (FOLVITE) tablet 1 mg (1 mg Oral Not Given 05/01/18 2126)  mometasone-formoterol (DULERA) 200-5 MCG/ACT inhaler 2 puff (2 puffs Inhalation Given 05/01/18 2116)  guaiFENesin (MUCINEX) 12 hr tablet 600 mg (has no administration in time range)  acetaminophen (TYLENOL) tablet 650 mg (has no administration in time range)  ondansetron (ZOFRAN) injection 4 mg (has no administration in time range)  0.9 %  sodium chloride infusion ( Intravenous Transfusing/Transfer 05/01/18 1833)  enoxaparin (LOVENOX) injection 90 mg (90 mg Subcutaneous Not Given 05/01/18 2126)  sodium chloride flush (NS) 0.9 % injection 10-40 mL (has  no administration in time range)  sodium chloride 0.9 % bolus 1,000 mL (0 mLs Intravenous Stopped 05/01/18 1352)  sodium chloride 0.9 % bolus 1,000 mL (0 mLs Intravenous Stopped 05/01/18 1540)  metoprolol tartrate (LOPRESSOR) injection 2.5 mg (2.5 mg Intravenous Given 05/01/18 1551)  metoprolol tartrate (LOPRESSOR) injection 2.5 mg (2.5 mg Intravenous Given 05/01/18 1627)     Initial Impression / Assessment and Plan / ED Course  I have reviewed the triage vital signs and the nursing notes.  Pertinent labs & imaging results that were available during my care of the patient were reviewed by me and considered in my medical decision making (see chart for details).     Patient's tachycardia is probably atrial flutter.  I discussed with Dr. Gwenlyn Found, who agrees this is probably a flutter 2: 1.  Recommend small dose of metoprolol given his low blood pressure in the 90s and 80s.  Unfortunately this did not seem to help besides very brief slowing down of his heart rate.  He was then placed on Cardizem bolus and drip and will be admitted to the hospitalist service.  This does seem to be improving his heart rate his blood pressure is slowly coming up.  Blood pressure being low is probably related to the tachycardia.  I doubt infection or PE given he is otherwise asymptomatic.  Given he does not know when the elevated heart rate started, he is not a candidate for ED cardioversion.  CHA2DS2/VAS Stroke Risk Points  Current as of 8 minutes ago     3 >= 2 Points: High Risk  1 - 1.99 Points: Medium Risk  0 Points: Low Risk    A final score could not be computed because of missing components.:   Last Change: N/A     Details    This score determines the patient's risk of having a stroke if the  patient has atrial fibrillation.       Points Metrics  0  Has Congestive Heart Failure:  No    Current as of 8 minutes ago  1 Has Vascular Disease:  Yes    Current as of 8 minutes ago  1 Has Hypertension:  Yes     Current as of 8 minutes ago  1 Age:  28    Current as of 8 minutes ago  0 Has Diabetes:  No    Current as of 8 minutes ago  0 Had Stroke:  No  Had TIA:  No  Had thromboembolism:  No    Current as of 8 minutes ago  0 Male:  No    Current as of 8 minutes ago            Final Clinical Impressions(s) / ED Diagnoses   Final diagnoses:  Atrial flutter, unspecified type [facility])    ED Discharge Orders    None       Sherwood Gambler, MD 05/01/18 2142

## 2018-05-01 NOTE — Assessment & Plan Note (Deleted)
Preventative protocols reviewed and updated unless pt declined. Discussed healthy diet and lifestyle.  

## 2018-05-01 NOTE — H&P (Signed)
History and Physical    Brian White WVP:[id number] DOB: 1951/03/21 DOA: 05/01/2018  PCP: [name], MD Consultants:  none Patient coming from: home- lives with wife  Chief Complaint: Tachycardia  HPI: Brian White is a 67 y.o. male with medical history significant for recurrent NSCLC stage IIIb, T1bN3M0 s/p chemoradiation and currently on maintenance chemo q3weeks (Keytruda/Alimpta), HTN, COPD and currently smoking 2/3 ppd (51 year hx), Prostate CA 2013 s/p radiation, CAD, who presented to the ED from his PCP's office today due to asymptomatic tachycardia.  He went in for a routine annual exam and heart rate was found to be 145.  He does have a history of a low TSH but this was normal when checked today.  He denies any chest pain, worsening shortness of breath, worsened dyspnea on exertion, orthopnea, lower extremity edema.  He did get more tired than usual while driving yesterday and had to pull over to take a 15 minute nap. However he denies lightheadedness / presyncope. Says his HR has always been low in the past and that he had the "heart of an athlete." Sister was recently diagnosed with atrial fib. He was recently started on remeron (1 week ago) for insomnia / depression. He has had progressive anemia over the past few months. He has a h/o CAD on his problem list - apparently he had a mild MI seen on stress test pt was unaware had happened.  ED Course: Systolic blood pressure was 85-90.  Heart rate was in the 130s to 140s.  Patient was asymptomatic in the ED.  EKG showed rate of 147, sinus tach vs A flutter. He was given IV fluids, metoprolol 2.5 mg IV x1 and then started on a diltiazem drip at 5 mg/h.  Subsequently, heart rate was in the 70s to 90s, blood pressure improved to 90s to 100s over 60s to 70s.  F/u EKG showed rate 76, more pronounced a flutter with predominant 4:1 AV block.  Review of Systems: As per HPI; otherwise review of systems reviewed and negative except for: He  reports he had diarrhea x 1 episode on Monday. No fevers/chills. No N/V. Has chronic watery eyes since being on Keytruda  Ambulatory Status:  Ambulates without assistance  Past Medical History:  Diagnosis Date  . Adenocarcinoma, lung ([place]) 10/08/2015   2.4cm spiculated LLL nodule suspicious for primary bronchogenic carcinoma by CT 09/2015 - adenocarcinoma by endobronchial biopsy  . Allergy   . Arthritis   . CAD (coronary artery disease)    mild MI; pt unaware - seen on stress test  . COPD (chronic obstructive pulmonary disease) ([place]) 06/26/2011   Mild centrilobular and paraseptal emphysema by CT 09/2015   . ED (erectile dysfunction)   . H/O anaphylactic shock 2013   hives,hypotension,syncope  . H/O seasonal allergies   . Herpes zoster 12/06/2015  . Hypertension   . Polycythemia 01/25/2014  . Prostate cancer [facility]) 2013   s/p radiation therapy March 2013, planned f/u with uro after XRT  . Radiation 09/18/2011 through 11/10/2011   Prostate 7600 cGy 40 sessions, seminal vesicles 5600 cGy 40 sessions    . Radiation 11/10/15-12/22/15   chest 60 Gy  . Shortness of breath dyspnea   . Smoker   . Thoracic aorta atherosclerosis ([place]) 10/08/2015   Mild by CT 09/2015     Past Surgical History:  Procedure Laterality Date  . COLONOSCOPY W/ BIOPSIES AND POLYPECTOMY  10/11/15   mult TAs, 3cm rectal polypoid lesion; rec further treatment, diverticulosis ([place])  .  ENDOBRONCHIAL ULTRASOUND Bilateral 10/18/2015   Procedure: ENDOBRONCHIAL ULTRASOUND;  Surgeon: Javier Glazier, MD;  Location: [facility];  Service: Cardiopulmonary;  Laterality: Bilateral;  . HERNIA REPAIR Right 2005   with mesh  . INSERTION PROSTATE RADIATION SEED  08/2011   prostate cancer, [place]  . IR FLUORO GUIDE PORT INSERTION RIGHT  07/26/2017  . IR US GUIDE VASC ACCESS RIGHT  07/26/2017    Social History   Socioeconomic History  . Marital status: Married    Spouse name: Not on file  . Number of  children: Y  . Years of education: Not on file  . Highest education level: Not on file  Occupational History  . Occupation: retired  Scientific laboratory technician  . Financial resource strain: Not on file  . Food insecurity:    Worry: Not on file    Inability: Not on file  . Transportation needs:    Medical: Not on file    Non-medical: Not on file  Tobacco Use  . Smoking status: Light Tobacco Smoker    Packs/day: 1.00    Years: 49.00    Pack years: 49.00    Types: Cigarettes    Start date: 01/16/1967    Last attempt to quit: 11/06/2015    Years since quitting: 2.4  . Smokeless tobacco: Never Used  . Tobacco comment: Peak rate of 2ppd  Substance and Sexual Activity  . Alcohol use: Yes    Alcohol/week: 1.0 standard drinks    Types: 1 Shots of liquor per week    Comment: 2-3 beers or mixed drinks daily    . Drug use: Yes    Types: Marijuana    Comment: marijuana occasional weekly  . Sexual activity: Yes    Birth control/protection: None  Lifestyle  . Physical activity:    Days per week: Not on file    Minutes per session: Not on file  . Stress: Not on file  Relationships  . Social connections:    Talks on phone: Not on file    Gets together: Not on file    Attends religious service: Not on file    Active member of club or organization: Not on file    Attends meetings of clubs or organizations: Not on file    Relationship status: Not on file  . Intimate partner violence:    Fear of current or ex partner: Not on file    Emotionally abused: Not on file    Physically abused: Not on file    Forced sexual activity: Not on file  Other Topics Concern  . Not on file  Social History Narrative   Lives with wife, dogs, chickens and goats   Occupation: retired.  On disability for herinated discs in back   Edu: HS   Activity: works outside [employer], takes care of farm animals   Diet: good water, fruits/vegetables daily      [facility]:   Originally from [place]. 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 No known asbestos exposure. Questionable mold exposure. Has 2 dogs currently. No recent bird exposure. Previously had chickens.     Allergies  Allergen Reactions  . Onion Anaphylaxis         Family History  Problem Relation Age of Onset  . Asthma Father   . COPD Father   . CAD Father        possibly  . Emphysema Father   .  Breast cancer Sister   . Hypertension Mother   . Diabetes Neg Hx   . Stroke Neg Hx   . Colon cancer Neg Hx   . Colon polyps Neg Hx   . Rectal cancer Neg Hx   . Stomach cancer Neg Hx     Prior to Admission medications   Medication Sig Start Date End Date Taking? Authorizing Provider  aspirin 81 MG tablet Take 81 mg by mouth daily. Reported on 12/21/2015    [provider]  budesonide-formoterol (SYMBICORT) 160-4.5 MCG/ACT inhaler Inhale 1 puff into the lungs 2 (two) times daily. 02/28/17   [name], MD  cetirizine (ZYRTEC) 10 MG chewable tablet Chew 10 mg by mouth daily. 02/19/17   [provider]  dexamethasone (DECADRON) 4 MG tablet 4 mg by mouth twice a day the day before, day of and after the chemotherapy every 3 weeks. 04/03/18   [name], MD  diphenhydrAMINE (BENADRYL) 25 MG tablet Take 25 mg by mouth at bedtime as needed for sleep.    [provider]  folic acid (FOLVITE) 1 MG tablet TAKE 1 TABLET(1 MG) BY MOUTH DAILY 04/03/18   [name], MD  folic acid (FOLVITE) 1 MG tablet TAKE 1 TABLET(1 MG) BY MOUTH DAILY 04/09/18   [name], MD  guaiFENesin (MUCINEX) 600 MG 12 hr tablet Take by mouth 2 (two) times daily.    [provider]  hydrOXYzine (ATARAX/VISTARIL) 10 MG tablet Take 1 tablet (10 mg total) by mouth 3 (three) times daily as needed. 01/30/18   Maryanna Shape, NP  ibuprofen (ADVIL,MOTRIN) 200 MG tablet Take 600 mg by mouth every 6 (six) hours as needed (Pain). Reported on 12/21/2015    [provider]  lidocaine-prilocaine (EMLA) cream Apply 1 application topically as needed. Squeeze a  small amount of cream to cotton ball and apply to port site 1-2 hours prior to chemotherapy. Do not rub in the cream and cover  with plastic wrap. 07/30/17   [name], MD  losartan-hydrochlorothiazide [facility]) 100-25 MG tablet TAKE 1 TABLET BY MOUTH EVERY DAY 04/09/18   [name], MD  mirtazapine (REMERON) 15 MG tablet Take 1 tablet (15 mg total) by mouth at bedtime. 04/24/18   Owens Shark, NP  prochlorperazine (COMPAZINE) 10 MG tablet Take 1 tablet (10 mg total) by mouth every 6 (six) hours as needed for nausea or vomiting. 05/08/17   [name], MD    Physical Exam: Vitals:   05/01/18 1445 05/01/18 1500 05/01/18 1530 05/01/18 1545  BP: 97/76 98/80 91/72  (!) 86/66  Pulse:      Resp: (!) 25 (!) 25 (!) 25 (!) 25  Temp:      TempSrc:      SpO2:         . General:  Appears calm and comfortable and is in NAD . Eyes:  PERRL, EOMI, normal lids, iris . ENT:  grossly normal hearing, lips & tongue, mmm; upper dentures . Neck:  no LAD, masses or thyromegaly; no carotid bruits . Cardiovascular: tachy, regular, no murmur. Marland Kitchen Respiratory:   CTA bilaterally with no wheezes/rales/rhonchi.  Normal respiratory effort.  R chest port . Abdomen:  soft, NT, ND, NABS . Back:   grossly normal alignment, no CVAT . Skin:  no rash or induration seen on limited exam . Musculoskeletal:  grossly normal tone BUE/BLE, good ROM, no bony abnormality or obvious joint deformity . Lower extremity:  No LE edema.  Limited foot exam with no  ulcerations.  2+ distal pulses. Marland Kitchen Psychiatric:  grossly normal mood and affect, speech fluent and appropriate, AOx3 . Neurologic:  CN 2-12 grossly intact, moves all extremities in coordinated fashion, sensation intact    Radiological Exams on Admission: Dg Chest 2 View  Result Date: 05/01/2018 CLINICAL DATA:  Palpitations, chest pain. EXAM: CHEST - 2 VIEW  COMPARISON:  Radiograph of October 18, 2015. FINDINGS: Stable cardiomediastinal silhouette. No pneumothorax or pleural effusion is noted. Interval placement of right internal jugular Port-A-Cath with distal tip in expected position of superior vena cava. Both lungs are clear. The visualized skeletal structures are unremarkable. IMPRESSION: Interval placement of right internal jugular Port-A-Cath. No acute cardiopulmonary abnormality seen. Electronically Signed   By: Marijo Conception, M.D.   On: 05/01/2018 12:55    EKG: As above; personally reviewed   Labs on Admission: I have personally reviewed the available labs and imaging studies at the time of the admission.  Pertinent labs:  Na 137 K 4.0 Cl 103 CO2 23 Gluc 125 BUN 29 Creat 0.96 Ca 9.3  TnI 0.06  WBC 2.5 Hgb 9.3 Plt 165  TSH 0.913 FT4 1.3   Assessment/Plan Principal Problem:   Atrial flutter (HCC) Active Problems:   HTN (hypertension)   COPD (chronic obstructive pulmonary disease) (HCC)   Smoker   CAD (coronary artery disease)   HLD (hyperlipidemia)   Cancer of lower lobe of left lung (HCC)   Anemia   Atrial flutter -unclear precipitating cause although pt has known risk factors of COPD and CAD -consider referral for sleep study as outpatient to eval for OSA -pt is at higher than normal risk for PE given his malignancy; possible that a PE may be driving this; will start with TTE; start lovenox 1 mg/kg BID as CHA2DS2-Vasc score is 3 -would likely change to xarelto or eliquis prior to discharge -would consult cardiology in a.m. - to discuss treatment options ie ablation -may need TEE to r/o thrombus -maintain rate control with diltiazem -repeat EKG in a.m. -cycle troponins (low suspicion for ACS)  Lung CA as above -continue outpatient followup and treatment -next chemo not due until Oct 30  HTN -hold Hyzaar given soft BP  COPD and active smoker -tobacco cessation counseling given; continue to encourage and  explore options for helping him quit -cont symbicort  CAD -cont ASA 81 -cycle troponins -EKG in a.m.  Anemia -Hgb baseline; cont to monitor   DVT prophylaxis:  Lovenox treatment dose Code Status:  Full - confirmed with patient/family Family Communication: wife at bedside  Disposition Plan:  Home once clinically improved Consults called: none; cardiology to be called tomorrow  Admission status: Admit - It is my clinical opinion that admission to INPATIENT is reasonable and necessary because of the expectation that this patient will require hospital care that crosses at least 2 midnights to treat this condition based on the medical complexity of the problems presented.  Given the aforementioned information, the predictability of an adverse outcome is felt to be significant.     Janora Norlander MD [facility]  If note is complete, please contact covering daytime or nighttime physician. [email or web address] Password [email or web address]  05/01/2018, 5:01 PM

## 2018-05-01 NOTE — Progress Notes (Addendum)
BP 126/62 (BP Location: Right Arm, Patient Position: Sitting)   Pulse (!) 145   Temp 97.8 F (36.6 C) (Oral)   Ht 5' 9.5" (1.765 m)   Wt 193 lb 8 oz (87.8 kg)   SpO2 99%   BMI 28.17 kg/m    CC: CPE - converted to acute visit due to tachycardia.  Subjective:    Patient ID: Brian White, male    DOB: July 23, 1950, 67 y.o.   MRN: [id number]  HPI: Brian White is a 67 y.o. male presenting on 05/01/2018 for Annual Exam   Did not see Katha Cabal this year. Last seen here 02/2017.  Noted fast heart rate today - pt denies feelings of palpitations, chest pain/tightness, leg swelling, change in chronic dyspnea. Denies significant dizziness. Overall feeling well.   Reviewing chart, he has had chronically low TSH over the past year, new since last visit here.  Followed by oncology for recurrent stage IIIB non small cell lung cancer dx 10/2015 through lung cancer screening CT, s/p chemo, now on maintenance Alimta/Keytruda. Notes persistent watering of eyes as side effect of Keytruda. Remeron recently started for depression. Progressive anemia over last few months, iFOB pending.   Preventative: Colonoscopy 09/2015 - mult TAs, 3cm rectal polypoid lesion, rec further treatment, diverticulosis ([place]) - never f/u because around that time he started chemo. Plan was for endoscopic polypectomy - never completed.  H/o prostate cancer - saw Dr. Gaynelle Arabian. S/p radiation only, denies seeds. Last saw 2013. Plan was to return to urology but declines return. Requests we continue checking PSA here but declines DRE.  Lung cancer screening: positive - see above.  Flu shot encouraged he get one this year Td ~2009 Pneumovax - 11/2012. Declines other pneumococcal vaccines today. shingrix - declines.  Advanced directive: discussed with patient. He does not have set up but would want wife to be HCPOA.  Seat belt use discussed Sunscreen use discussed. No changing moles on skin.  Ongoing smoker - down to 1/2 ppd    Alcohol - 3-4 drinks/day in evenings. Beer or mixed drink. Never withdrawals.   Lives with wife, dogs, chickens and goats  Occupation: retired. 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    Relevant past medical, surgical, family and social history reviewed and updated as indicated. Interim medical history since our last visit reviewed. Allergies and medications reviewed and updated. Outpatient Medications Prior to Visit  Medication Sig Dispense Refill  . aspirin 81 MG tablet Take 81 mg by mouth daily. Reported on 12/21/2015    . budesonide-formoterol (SYMBICORT) 160-4.5 MCG/ACT inhaler Inhale 1 puff into the lungs 2 (two) times daily. 3 Inhaler 3  . cetirizine (ZYRTEC) 10 MG chewable tablet Chew 10 mg by mouth daily.    Marland Kitchen dexamethasone (DECADRON) 4 MG tablet 4 mg by mouth twice a day the day before, day of and after the chemotherapy every 3 weeks. 40 tablet 0  . diphenhydrAMINE (BENADRYL) 25 MG tablet Take 25 mg by mouth at bedtime as needed for sleep.    . folic acid (FOLVITE) 1 MG tablet TAKE 1 TABLET(1 MG) BY MOUTH DAILY 30 tablet 0  . guaiFENesin (MUCINEX) 600 MG 12 hr tablet Take by mouth 2 (two) times daily.    . hydrOXYzine (ATARAX/VISTARIL) 10 MG tablet Take 1 tablet (10 mg total) by mouth 3 (three) times daily as needed. 30 tablet 0  .  ibuprofen (ADVIL,MOTRIN) 200 MG tablet Take 600 mg by mouth every 6 (six) hours as needed (Pain). Reported on 12/21/2015    . lidocaine-prilocaine (EMLA) cream Apply 1 application topically as needed. Squeeze a  small amount of cream to cotton ball and apply to port site 1-2 hours prior to chemotherapy. Do not rub in the cream and cover  with plastic wrap. 30 g 0  . losartan-hydrochlorothiazide (HYZAAR) 100-25 MG tablet TAKE 1 TABLET BY MOUTH EVERY DAY 30 tablet 0  . mirtazapine (REMERON) 15 MG tablet Take 1 tablet (15 mg total) by  mouth at bedtime. 30 tablet 0  . prochlorperazine (COMPAZINE) 10 MG tablet Take 1 tablet (10 mg total) by mouth every 6 (six) hours as needed for nausea or vomiting. 30 tablet 0  . folic acid (FOLVITE) 1 MG tablet TAKE 1 TABLET(1 MG) BY MOUTH DAILY 30 tablet 0   No facility-administered medications prior to visit.      Per HPI unless specifically indicated in ROS section below Review of Systems       Objective:    BP 126/62 (BP Location: Right Arm, Patient Position: Sitting)   Pulse (!) 145   Temp 97.8 F (36.6 C) (Oral)   Ht 5' 9.5" (1.765 m)   Wt 193 lb 8 oz (87.8 kg)   SpO2 99%   BMI 28.17 kg/m   Wt Readings from Last 3 Encounters:  05/01/18 193 lb 8 oz (87.8 kg)  04/24/18 197 lb 9.6 oz (89.6 kg)  04/03/18 201 lb 8 oz (91.4 kg)    Physical Exam  Constitutional: He appears well-developed and well-nourished. No distress.  Cardiovascular: Regular rhythm and normal heart sounds. Tachycardia present.  No murmur heard. Marked tachycardia  Pulmonary/Chest: Effort normal and breath sounds normal. No respiratory distress. He has no wheezes. He has no rales.  Coarse breath sounds  Nursing note and vitals reviewed.  Results for orders placed or performed in visit on 04/24/18  CMP ([facility] only)  Result Value Ref Range   Sodium 139 135 - 145 mmol/L   Potassium 4.2 3.5 - 5.1 mmol/L   Chloride 104 98 - 111 mmol/L   CO2 26 22 - 32 mmol/L   Glucose, Bld 118 (H) 70 - 99 mg/dL   BUN 21 8 - 23 mg/dL   Creatinine 1.08 0.61 - 1.24 mg/dL   Calcium 9.5 8.9 - 10.3 mg/dL   Total Protein 7.0 6.5 - 8.1 g/dL   Albumin 3.6 3.5 - 5.0 g/dL   AST 18 15 - 41 U/L   ALT 12 0 - 44 U/L   Alkaline Phosphatase 89 38 - 126 U/L   Total Bilirubin 0.4 0.3 - 1.2 mg/dL   GFR, Est Non Af Am >60 >60 mL/min   GFR, Est AFR Am >60 >60 mL/min   Anion gap 9 5 - 15  CBC with Differential (Cancer Center Only)  Result Value Ref Range   WBC Count 4.6 4.0 - 10.5 K/uL   RBC 2.43 (L) 4.22 - 5.81 MIL/uL    Hemoglobin 8.7 (L) 13.0 - 17.0 g/dL   HCT 26.9 (L) 39.0 - 52.0 %   MCV 110.7 (H) 80.0 - 100.0 fL   MCH 35.8 (H) 26.0 - 34.0 pg   MCHC 32.3 30.0 - 36.0 g/dL   RDW 14.9 11.5 - 15.5 %   Platelet Count 305 150 - 400 K/uL   nRBC 0.0 0.0 - 0.2 %   Neutrophils Relative % 76 %   Neutro  Abs 3.5 1.7 - 7.7 K/uL   Lymphocytes Relative 12 %   Lymphs Abs 0.5 (L) 0.7 - 4.0 K/uL   Monocytes Relative 11 %   Monocytes Absolute 0.5 0.1 - 1.0 K/uL   Eosinophils Relative 0 %   Eosinophils Absolute 0.0 0.0 - 0.5 K/uL   Basophils Relative 0 %   Basophils Absolute 0.0 0.0 - 0.1 K/uL   Immature Granulocytes 1 %   Abs Immature Granulocytes 0.05 0.00 - 0.07 K/uL      Assessment & Plan:  CPE postponed due to tachycardia - he will return for this.  Problem List Items Addressed This Visit    Tachycardia - Primary    New asymptomatic tachycardia concern for atrial flutter in h/o RBBB and LAFB. Has not previously seen cards. Has several possible reasons for this - untreated hyperthyroidism, progressive anemia, ?PE. Discussed with patient - safest place for further evaluation is in the ER - he will go there now. We called [facility] ER to notify pt is on his way. I did touch base with cardiology at [facility] regarding patient.       Relevant Orders   EKG 12-Lead (Completed)   Smoker   Rectal polyp   Low serum thyroid stimulating hormone (TSH)    Ongoing for the past year - ?immune mediated from [place]. This will need further evaluation - may be contributing.       COPD (chronic obstructive pulmonary disease) (HCC) (Chronic)   Cancer of lower lobe of left lung (HCC)   Anemia    Progressive - will need reassessed as may be contributing. He has pending stool cards.        Other Visit Diagnoses    Need for influenza vaccination       Relevant Orders   Flu Vaccine QUAD 36+ mos IM (Completed)       No orders of the defined types were placed in this encounter.  Orders Placed This Encounter  Procedures  . Flu  Vaccine QUAD 36+ mos IM  . EKG 12-Lead    Follow up plan: No follow-ups on file.  [name], MD

## 2018-05-01 NOTE — Patient Instructions (Addendum)
Flu shot today EKG today I touched base with the cardiologist - agrees best place is ER for quickest evaluation and treatment of tachycardia today. We will call and let them know you're on your way.

## 2018-05-01 NOTE — ED Notes (Signed)
Patient transported to X-ray 

## 2018-05-02 ENCOUNTER — Inpatient Hospital Stay (HOSPITAL_COMMUNITY): Payer: BLUE CROSS/BLUE SHIELD

## 2018-05-02 ENCOUNTER — Other Ambulatory Visit: Payer: Self-pay

## 2018-05-02 DIAGNOSIS — I429 Cardiomyopathy, unspecified: Secondary | ICD-10-CM

## 2018-05-02 DIAGNOSIS — I340 Nonrheumatic mitral (valve) insufficiency: Secondary | ICD-10-CM

## 2018-05-02 DIAGNOSIS — I4892 Unspecified atrial flutter: Secondary | ICD-10-CM

## 2018-05-02 DIAGNOSIS — F17200 Nicotine dependence, unspecified, uncomplicated: Secondary | ICD-10-CM

## 2018-05-02 DIAGNOSIS — J438 Other emphysema: Secondary | ICD-10-CM

## 2018-05-02 DIAGNOSIS — C3432 Malignant neoplasm of lower lobe, left bronchus or lung: Secondary | ICD-10-CM

## 2018-05-02 LAB — BASIC METABOLIC PANEL
Anion gap: 9 (ref 5–15)
BUN: 26 mg/dL — ABNORMAL HIGH (ref 8–23)
CO2: 25 mmol/L (ref 22–32)
Calcium: 8.5 mg/dL — ABNORMAL LOW (ref 8.9–10.3)
Chloride: 106 mmol/L (ref 98–111)
Creatinine, Ser: 1 mg/dL (ref 0.61–1.24)
GFR calc Af Amer: >60 mL/min (ref >60)
GFR calc non Af Amer: >60 mL/min (ref >60)
Glucose, Bld: 104 mg/dL — ABNORMAL HIGH (ref 70–99)
Potassium: 3.8 mmol/L (ref 3.5–5.1)
Sodium: 140 mmol/L (ref 135–145)

## 2018-05-02 LAB — IRON AND TIBC
Iron: 83 ug/dL (ref 45–182)
Saturation Ratios: 35 % (ref 17.9–39.5)
TIBC: 238 ug/dL — ABNORMAL LOW (ref 250–450)
UIBC: 155 ug/dL

## 2018-05-02 LAB — CBC
HCT: 25.4 % — ABNORMAL LOW (ref 39.0–52.0)
Hemoglobin: 8 g/dL — ABNORMAL LOW (ref 13.0–17.0)
MCH: 35.1 pg — ABNORMAL HIGH (ref 26.0–34.0)
MCHC: 31.5 g/dL (ref 30.0–36.0)
MCV: 111.4 fL — ABNORMAL HIGH (ref 80.0–100.0)
Platelets: 104 10*3/uL — ABNORMAL LOW (ref 150–400)
RBC: 2.28 MIL/uL — ABNORMAL LOW (ref 4.22–5.81)
RDW: 14.6 % (ref 11.5–15.5)
WBC: 1.6 10*3/uL — ABNORMAL LOW (ref 4.0–10.5)
nRBC: 0 % (ref 0.0–0.2)

## 2018-05-02 LAB — HEPARIN LEVEL (UNFRACTIONATED): Heparin Unfractionated: 0.19 IU/mL — ABNORMAL LOW (ref 0.30–0.70)

## 2018-05-02 LAB — PROTIME-INR
INR: 1.13
Prothrombin Time: 14.4 seconds (ref 11.4–15.2)

## 2018-05-02 LAB — APTT: aPTT: 39 seconds — ABNORMAL HIGH (ref 24–36)

## 2018-05-02 LAB — HIV ANTIBODY (ROUTINE TESTING W REFLEX): HIV Screen 4th Generation wRfx: NONREACTIVE

## 2018-05-02 LAB — TROPONIN I: Troponin I: 0.07 ng/mL (ref <0.03)

## 2018-05-02 MED ORDER — SODIUM CHLORIDE 0.9 % IV SOLN
INTRAVENOUS | Status: DC
  Administered 2018-05-03: 09:00:00 via INTRAVENOUS

## 2018-05-02 MED ORDER — HEPARIN (PORCINE) IN NACL 100-0.45 UNIT/ML-% IJ SOLN
1300.0000 [IU]/h | INTRAMUSCULAR | Status: DC
  Administered 2018-05-02 (×2): 1300 [IU]/h via INTRAVENOUS
  Filled 2018-05-02: qty 250

## 2018-05-02 MED ORDER — HEPARIN BOLUS VIA INFUSION
2000.0000 [IU] | Freq: Once | INTRAVENOUS | Status: AC
  Administered 2018-05-02: 2000 [IU] via INTRAVENOUS
  Filled 2018-05-02: qty 2000

## 2018-05-02 NOTE — Progress Notes (Addendum)
[place] for heparin Indication: atrial fibrillation  Allergies  Allergen Reactions  . Onion Anaphylaxis         Patient Measurements: Height: 5\' 11"  (180.3 cm) Weight: 194 lb 4.8 oz (88.1 kg) IBW/kg (Calculated) : 75.3 Heparin Dosing Weight: 88kg  Vital Signs: Temp: 98.3 F (36.8 C) (10/17 2001) Temp Source: Oral (10/17 2001) BP: 105/73 (10/17 2001) Pulse Rate: 68 (10/17 2003)  Labs: Recent Labs    05/01/18 1210 05/01/18 2341 05/02/18 0354 05/02/18 2218  HGB 9.3*  --  8.0*  --   HCT 29.9*  --  25.4*  --   PLT 165  --  104*  --   APTT  --  39*  --   --   LABPROT  --  14.4  --   --   INR  --  1.13  --   --   HEPARINUNFRC  --   --   --  0.19*  CREATININE 0.96  --  1.00  --   TROPONINI  --  0.07*  --   --     Estimated Creatinine Clearance: 77.4 mL/min (by C-G formula based on SCr of 1 mg/dL).   Medical History: Past Medical History:  Diagnosis Date  . Adenocarcinoma, lung ([place]) 10/08/2015   2.4cm spiculated LLL nodule suspicious for primary bronchogenic carcinoma by CT 09/2015 - adenocarcinoma by endobronchial biopsy  . Allergy   . Arthritis   . CAD (coronary artery disease)    mild MI; pt unaware - seen on stress test  . COPD (chronic obstructive pulmonary disease) ([place]) 06/26/2011   Mild centrilobular and paraseptal emphysema by CT 09/2015   . ED (erectile dysfunction)   . H/O anaphylactic shock 2013   hives,hypotension,syncope  . H/O seasonal allergies   . Herpes zoster 12/06/2015  . Hypertension   . Polycythemia 01/25/2014  . Prostate cancer [facility]) 2013   s/p radiation therapy March 2013, planned f/u with uro after XRT  . Radiation 09/18/2011 through 11/10/2011   Prostate 7600 cGy 40 sessions, seminal vesicles 5600 cGy 40 sessions    . Radiation 11/10/15-12/22/15   chest 60 Gy  . Shortness of breath dyspnea   . Smoker   . Thoracic aorta atherosclerosis ([place]) 10/08/2015   Mild by CT 09/2015       Assessment: 31 yoM with new onset AFL initially to be managed with therapeutic enoxaparin, but pt refused injections so now to transition to heparin per pharmacy. Pt to undergo TEE-DCCV tomorrow. Note pt has hx of pancytopenia due to chemotherapy related to NSCLC. Will give small bolus as pt has not received any enoxaparin this admit but will avoid full bolus with pancytopenia. Initial heparin level 0.19 units/ml  Goal of Therapy:  Heparin level 0.3-0.7 units/ml Monitor platelets by anticoagulation protocol: Yes   Plan:  -Increase heparin to 1500 unit/hr -f/u am labs -TEE-DCCV tomorrow  Thanks for allowing pharmacy to be a part of this patient's care.  Excell Seltzer, PharmD Clinical Pharmacist  05/02/2018   Addum:  Order to increase rate not entered.  HL this am 0.46 units/ml.  Continue heparin at 1300 units/hr

## 2018-05-02 NOTE — Progress Notes (Signed)
Patient stable, VSS. Wife at bedside.

## 2018-05-02 NOTE — Plan of Care (Signed)
  Problem: Education: Goal: Knowledge of General Education information will improve Description Including pain rating scale, medication(s)/side effects and non-pharmacologic comfort measures Outcome: Progressing   Problem: Health Behavior/Discharge Planning: Goal: Ability to manage health-related needs will improve Outcome: Progressing   

## 2018-05-02 NOTE — Progress Notes (Signed)
PROGRESS NOTE  Brian White WNU:[id number] DOB: 12/12/1950 DOA: 05/01/2018 PCP: [name], MD  HPI/Recap of past 24 hours: Sitting up in chair, remain in aflutter, but he denies symptom,  Wife at beside  Assessment/Plan: Principal Problem:   Atrial flutter ([place]) Active Problems:   HTN (hypertension)   COPD (chronic obstructive pulmonary disease) ([place])   Smoker   CAD (coronary artery disease)   HLD (hyperlipidemia)   Cancer of lower lobe of left lung ([place])   Anemia  Aflutter /RVR -New diagnosis -he does not appear symptomatic, unknown duration, he is found to have tachycardia at his pcp's office and was sent to the hospital for further eval -he denies h/o osa ( but never has sleep study) in the past, tsh 1.3 -he is started on cardizem drip, anticoagulation ( will need to monitor hgb) -echo pending -cardiology consulted  Anemia hgb 9.3- 8 FOBt test pending Transfuse prn to keep hgb >8  CAD On asa only at home Denies chest pain  HTN Home meds losartan /hctz held, currently bp stable on cardizem drip  nonsmall cell lung cancer Has been on alimta and [place], last dose on 10/9  COPD and active smoker No wheezing  Code Status: Full  Family Communication: patient and wife  Disposition Plan: not ready to discharge   Consultants:  cardiology  Procedures:  none  Antibiotics:  none   Objective: BP 121/67   Pulse (!) 36   Temp 98 F (36.7 C) (Oral)   Resp 18   Ht 5\' 11"  (1.803 m)   Wt 88.1 kg   SpO2 99%   BMI 27.10 kg/m   Intake/Output Summary (Last 24 hours) at 05/02/2018 0809 Last data filed at 05/02/2018 0504 Gross per 24 hour  Intake 1769.82 ml  Output 200 ml  Net 1569.82 ml   Filed Weights   05/01/18 1900  Weight: 88.1 kg    Exam: Patient is examined daily including today on 05/02/2018, exams remain the same as of yesterday except that has changed    General:  NAD  Cardiovascular: IRRR  Respiratory: CTABL  Abdomen:  Soft/ND/NT, positive BS  Musculoskeletal: No Edema  Neuro: alert, oriented   Data Reviewed: Basic Metabolic Panel: Recent Labs  Lab 05/01/18 1210 05/02/18 0354  NA 137 140  K 4.0 3.8  CL 103 106  CO2 23 25  GLUCOSE 125* 104*  BUN 29* 26*  CREATININE 0.96 1.00  CALCIUM 9.3 8.5*   Liver Function Tests: No results for input(s): AST, ALT, ALKPHOS, BILITOT, PROT, ALBUMIN in the last 168 hours. No results for input(s): LIPASE, AMYLASE in the last 168 hours. No results for input(s): AMMONIA in the last 168 hours. CBC: Recent Labs  Lab 05/01/18 1210 05/02/18 0354  WBC 2.5* 1.6*  HGB 9.3* 8.0*  HCT 29.9* 25.4*  MCV 111.6* 111.4*  PLT 165 104*   Cardiac Enzymes:   Recent Labs  Lab 05/01/18 2341  TROPONINI 0.07*   BNP (last 3 results) No results for input(s): BNP in the last 8760 hours.  ProBNP (last 3 results) No results for input(s): PROBNP in the last 8760 hours.  CBG: No results for input(s): GLUCAP in the last 168 hours.  No results found for this or any previous visit (from the past 240 hour(s)).   Studies: Dg Chest 2 View  Result Date: 05/01/2018 CLINICAL DATA:  Palpitations, chest pain. EXAM: CHEST - 2 VIEW COMPARISON:  Radiograph of October 18, 2015. FINDINGS: Stable cardiomediastinal silhouette. No pneumothorax or pleural  effusion is noted. Interval placement of right internal jugular Port-A-Cath with distal tip in expected position of superior vena cava. Both lungs are clear. The visualized skeletal structures are unremarkable. IMPRESSION: Interval placement of right internal jugular Port-A-Cath. No acute cardiopulmonary abnormality seen. Electronically Signed   By: Marijo Conception, M.D.   On: 05/01/2018 12:55    Scheduled Meds: . aspirin EC  81 mg Oral Daily  . enoxaparin (LOVENOX) injection  90 mg Subcutaneous Q12H  . folic acid  1 mg Oral Daily  . mirtazapine  15 mg Oral QHS  . mometasone-formoterol  2 puff Inhalation BID    Continuous Infusions: .  sodium chloride 100 mL/hr at 05/02/18 0509  . diltiazem (CARDIZEM) infusion 10 mg/hr (05/02/18 0151)     Time spent: 71mins I have personally reviewed and interpreted on  05/02/2018 daily labs, tele strips, imagings as discussed above under date review session and assessment and plans.  I reviewed all nursing notes, pharmacy notes, consultant notes,  vitals, pertinent old records  I have discussed plan of care as described above with RN , patient and family on 05/02/2018   Florencia Reasons MD, PhD  [facility] Pager [phone]. If 7PM-7AM, please contact night-coverage at [email or web address], password [facility] 05/02/2018, 8:09 AM  LOS: 1 day

## 2018-05-02 NOTE — Progress Notes (Signed)
ANTICOAGULATION CONSULT NOTE - Initial Consult  Pharmacy Consult for heparin Indication: atrial fibrillation  Allergies  Allergen Reactions  . Onion Anaphylaxis         Patient Measurements: Height: 5\' 11"  (180.3 cm) Weight: 194 lb 4.8 oz (88.1 kg) IBW/kg (Calculated) : 75.3 Heparin Dosing Weight: 88kg  Vital Signs: Temp: 97.8 F (36.6 C) (10/17 1219) Temp Source: Oral (10/17 1219) BP: 116/90 (10/17 1219) Pulse Rate: 105 (10/17 1219)  Labs: Recent Labs    05/01/18 1210 05/01/18 2341 05/02/18 0354  HGB 9.3*  --  8.0*  HCT 29.9*  --  25.4*  PLT 165  --  104*  APTT  --  39*  --   LABPROT  --  14.4  --   INR  --  1.13  --   CREATININE 0.96  --  1.00  TROPONINI  --  0.07*  --     Estimated Creatinine Clearance: 77.4 mL/min (by C-G formula based on SCr of 1 mg/dL).   Medical History: Past Medical History:  Diagnosis Date  . Adenocarcinoma, lung ([place]) 10/08/2015   2.4cm spiculated LLL nodule suspicious for primary bronchogenic carcinoma by CT 09/2015 - adenocarcinoma by endobronchial biopsy  . Allergy   . Arthritis   . CAD (coronary artery disease)    mild MI; pt unaware - seen on stress test  . COPD (chronic obstructive pulmonary disease) ([place]) 06/26/2011   Mild centrilobular and paraseptal emphysema by CT 09/2015   . ED (erectile dysfunction)   . H/O anaphylactic shock 2013   hives,hypotension,syncope  . H/O seasonal allergies   . Herpes zoster 12/06/2015  . Hypertension   . Polycythemia 01/25/2014  . Prostate cancer [facility]) 2013   s/p radiation therapy March 2013, planned f/u with uro after XRT  . Radiation 09/18/2011 through 11/10/2011   Prostate 7600 cGy 40 sessions, seminal vesicles 5600 cGy 40 sessions    . Radiation 11/10/15-12/22/15   chest 60 Gy  . Shortness of breath dyspnea   . Smoker   . Thoracic aorta atherosclerosis ([place]) 10/08/2015   Mild by CT 09/2015      Assessment: 70 yoM with new onset AFL initially to be managed with  therapeutic enoxaparin, but pt refused injections so now to transition to heparin per pharmacy. Pt to undergo TEE-DCCV tomorrow. Note pt has hx of pancytopenia due to chemotherapy related to NSCLC. Will give small bolus as pt has not received any enoxaparin this admit but will avoid full bolus with pancytopenia.  Goal of Therapy:  Heparin level 0.3-0.7 units/ml Monitor platelets by anticoagulation protocol: Yes   Plan:  -Stop enoxaparin  -Heparin 2000 x1 then 1250 units/hr -Check 6hr heparin level -TEE-DCCV tomorrow  Arrie Senate, PharmD, BCPS Clinical Pharmacist [phone] Please check AMION for all [facility] Pharmacy numbers 05/02/2018

## 2018-05-02 NOTE — Consult Note (Signed)
Cardiology Consultation:   Patient ID: Brian White MRN: [id number]; DOB: [birth year]  Admit date: 05/01/2018 Date of Consult: 05/02/2018  Primary Care Provider: [name], MD Primary Cardiologist: Dr [name]   Patient Profile:   Brian White is a 67 y.o. male with a hx of hypertension, prostate cancer, stage IIIb non-small cell lung cancer undergoing chemotherapy who is being seen today for the evaluation of atrial flutter at the request of Royal Piedra MD.  History of Present Illness:   No prior cardiac history.  Patient has some dyspnea on exertion since diagnosis of lung cancer 2 years ago.  No recent change in denies orthopnea, PND, pedal edema, chest pain, palpitations or syncope.  Some unsteadiness and falls in the past.  Patient seen by primary care yesterday and noted to be tachycardic.  He was therefore admitted.  He was found to be in atrial flutter and cardiology asked to evaluate.  Note he received chemotherapy every 3 weeks.  Past Medical History:  Diagnosis Date  . Adenocarcinoma, lung ([place]) 10/08/2015   2.4cm spiculated LLL nodule suspicious for primary bronchogenic carcinoma by CT 09/2015 - adenocarcinoma by endobronchial biopsy  . Allergy   . Arthritis   . CAD (coronary artery disease)    mild MI; pt unaware - seen on stress test  . COPD (chronic obstructive pulmonary disease) ([place]) 06/26/2011   Mild centrilobular and paraseptal emphysema by CT 09/2015   . ED (erectile dysfunction)   . H/O anaphylactic shock 2013   hives,hypotension,syncope  . H/O seasonal allergies   . Herpes zoster 12/06/2015  . Hypertension   . Polycythemia 01/25/2014  . Prostate cancer [facility]) 2013   s/p radiation therapy March 2013, planned f/u with uro after XRT  . Radiation 09/18/2011 through 11/10/2011   Prostate 7600 cGy 40 sessions, seminal vesicles 5600 cGy 40 sessions    . Radiation 11/10/15-12/22/15   chest 60 Gy  . Shortness of breath dyspnea   . Smoker     . Thoracic aorta atherosclerosis ([place]) 10/08/2015   Mild by CT 09/2015     Past Surgical History:  Procedure Laterality Date  . COLONOSCOPY W/ BIOPSIES AND POLYPECTOMY  10/11/15   mult TAs, 3cm rectal polypoid lesion; rec further treatment, diverticulosis ([place])  . ENDOBRONCHIAL ULTRASOUND Bilateral 10/18/2015   Procedure: ENDOBRONCHIAL ULTRASOUND;  Surgeon: Javier Glazier, MD;  Location: [facility] ENDOSCOPY;  Service: Cardiopulmonary;  Laterality: Bilateral;  . HERNIA REPAIR Right 2005   with mesh  . INSERTION PROSTATE RADIATION SEED  08/2011   prostate cancer, [place]  . IR FLUORO GUIDE PORT INSERTION RIGHT  07/26/2017  . IR US GUIDE VASC ACCESS RIGHT  07/26/2017     Inpatient Medications: Scheduled Meds: . aspirin EC  81 mg Oral Daily  . enoxaparin (LOVENOX) injection  90 mg Subcutaneous Q12H  . folic acid  1 mg Oral Daily  . mirtazapine  15 mg Oral QHS  . mometasone-formoterol  2 puff Inhalation BID   Continuous Infusions: . sodium chloride 100 mL/hr at 05/02/18 0509  . diltiazem (CARDIZEM) infusion 10 mg/hr (05/02/18 0826)   PRN Meds: acetaminophen, guaiFENesin, ondansetron (ZOFRAN) IV, sodium chloride flush  Allergies:    Allergies  Allergen Reactions  . Onion Anaphylaxis         Social History:   Social History   Socioeconomic History  . Marital status: Married    Spouse name: Not on file  . Number of children: Y  . Years of education: Not on file  . Highest  education level: Not on file  Occupational History  . Occupation: retired  Scientific laboratory technician  . Financial resource strain: Not on file  . Food insecurity:    Worry: Not on file    Inability: Not on file  . Transportation needs:    Medical: Not on file    Non-medical: Not on file  Tobacco Use  . Smoking status: Heavy Tobacco Smoker    Packs/day: 0.67    Years: 54.00    Pack years: 36.18    Types: Cigarettes    Start date: 01/16/1967    Last attempt to quit: 11/06/2015    Years since quitting: 2.4   . Smokeless tobacco: Never Used  . Tobacco comment: Peak rate of 2ppd; quit on Chantix but started again   Substance and Sexual Activity  . Alcohol use: Yes    Alcohol/week: 1.0 standard drinks    Types: 1 Shots of liquor per week    Comment: 2-3 beers or mixed drinks daily    . Drug use: Yes    Types: Marijuana    Comment: marijuana occasional weekly  . Sexual activity: Yes    Birth control/protection: None  Lifestyle  . Physical activity:    Days per week: Not on file    Minutes per session: Not on file  . Stress: Not on file  Relationships  . Social connections:    Talks on phone: Not on file    Gets together: Not on file    Attends religious service: Not on file    Active member of club or organization: Not on file    Attends meetings of clubs or organizations: Not on file    Relationship status: Not on file  . Intimate partner violence:    Fear of current or ex partner: Not on file    Emotionally abused: Not on file    Physically abused: Not on file    Forced sexual activity: Not on file  Other Topics Concern  . Not on file  Social History Narrative   Lives with wife, dogs, chickens and goats   Occupation: retired.  On disability for herinated discs in back   Edu: HS   Activity: works outside [employer], takes care of farm animals   Diet: good water, fruits/vegetables daily      [place] [facility]:   Originally from [place]. 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 No known asbestos exposure. Questionable mold exposure. Has 2 dogs currently. No recent bird exposure. Previously had chickens.     Family History:    Family History  Problem Relation Age of Onset  . Asthma Father   . COPD Father   . CAD Father        possibly  . Emphysema Father   . Breast cancer Sister   . Atrial fibrillation Sister   . Hypertension Mother   . Diabetes Neg Hx   . Stroke Neg Hx   . Colon cancer Neg Hx   .  Colon polyps Neg Hx   . Rectal cancer Neg Hx   . Stomach cancer Neg Hx      ROS:  Please see the history of present illness.  Patient denies fevers, chills, productive cough, hemoptysis, dysphagia, odynophagia, melena, hematochezia. All other ROS reviewed and negative.     Physical Exam/Data:   Vitals:   05/02/18 0507 05/02/18 0835 05/02/18 0918 05/02/18 1219  BP: 121/67  119/80 116/90  Pulse: (!) 36 (!) 53 (!) 50 (!) 105  Resp: 18 18 (!) 26 20  Temp: 98 F (36.7 C)  98.2 F (36.8 C) 97.8 F (36.6 C)  TempSrc: Oral  Oral Oral  SpO2: 99% 97% 97% 100%  Weight:      Height:        Intake/Output Summary (Last 24 hours) at 05/02/2018 1428 Last data filed at 05/02/2018 1258 Gross per 24 hour  Intake 2138.24 ml  Output 400 ml  Net 1738.24 ml   Filed Weights   05/01/18 1900  Weight: 88.1 kg   Body mass index is 27.1 kg/m.  General:  Well nourished, well developed, in no acute distress HEENT: normal Lymph: no adenopathy Neck: no JVD Endocrine:  No thryomegaly Vascular: No carotid bruits; FA pulses 2+ bilaterally without bruits  Cardiac: Distant heart sounds, irregular Lungs: Mild rhonchi Abd: soft, nontender, no hepatomegaly  Ext: no edema Musculoskeletal:  No deformities, BUE and BLE strength normal and equal Skin: warm and dry  Neuro:  CNs 2-12 intact, no focal abnormalities noted Psych:  Normal affect   EKG:  The EKG was personally reviewed and demonstrates: Typical atrial flutter, PVCs, right bundle branch block, cannot rule out prior inferior infarct.  Laboratory Data:  Chemistry Recent Labs  Lab 05/01/18 1210 05/02/18 0354  NA 137 140  K 4.0 3.8  CL 103 106  CO2 23 25  GLUCOSE 125* 104*  BUN 29* 26*  CREATININE 0.96 1.00  CALCIUM 9.3 8.5*  GFRNONAA >60 >60  GFRAA >60 >60  ANIONGAP 11 9    Hematology Recent Labs  Lab 05/01/18 1210 05/02/18 0354  WBC 2.5* 1.6*  RBC 2.68* 2.28*  HGB 9.3* 8.0*  HCT 29.9* 25.4*  MCV 111.6* 111.4*  MCH  34.7* 35.1*  MCHC 31.1 31.5  RDW 14.6 14.6  PLT 165 104*   Cardiac Enzymes Recent Labs  Lab 05/01/18 2341  TROPONINI 0.07*    Recent Labs  Lab 05/01/18 1222  TROPIPOC 0.06     Radiology/Studies:  Dg Chest 2 View  Result Date: 05/01/2018 CLINICAL DATA:  Palpitations, chest pain. EXAM: CHEST - 2 VIEW COMPARISON:  Radiograph of October 18, 2015. FINDINGS: Stable cardiomediastinal silhouette. No pneumothorax or pleural effusion is noted. Interval placement of right internal jugular Port-A-Cath with distal tip in expected position of superior vena cava. Both lungs are clear. The visualized skeletal structures are unremarkable. IMPRESSION: Interval placement of right internal jugular Port-A-Cath. No acute cardiopulmonary abnormality seen. Electronically Signed   By: Marijo Conception, M.D.   On: 05/01/2018 12:55    Assessment and Plan:   1 new onset typical atrial flutter-duration of atrial flutter is unclear.  He is not symptomatic with no dyspnea, chest pain or palpitations.  His heart rate was noted to be elevated during recent routine physical examination with his primary care physician.  However his LV function is severely reduced and therefore he may have a tachycardia mediated cardiomyopathy.  I will change Lovenox to IV heparin (pt states injections are painful).  We will arrange a TEE guided cardioversion tomorrow.  Would then transition to apixaban 5 mg twice daily. CHADSvasc 3.  TSH and free T4 normal.  Would then plan outpatient evaluation by electrophysiology for consideration of ablation to avoid long-term anticoagulation particularly in light of need for intermittent chemotherapy which could lead to thrombocytopenia.  2 cardiomyopathy-I have preliminarily reviewed echocardiogram and LV function is severely reduced.  Question tachycardia mediated.  Note there is also  possible inferior infarct on ECG.  Therefore could be ischemia mediated.  We will continue with Cardizem for rate  control of atrial flutter until TEE guided cardioversion is performed.  I would then discontinue and treat with beta-blocker and ARB versus Entresto.  Repeat echocardiogram 3 months after sinus reestablished.  If LV function remains decreased would need ischemia evaluation.  3 pancytopenia-patient has mild thrombocytopenia today.  He receives chemotherapy every 3 weeks.  I will plan to recheck counts tomorrow prior to proceeding with above procedure.  There is no indication of active bleeding.  4 stage IIIb non-small cell lung cancer-patient has had this diagnosis for 2 years and is doing well from a functional capacity.  Management per oncology.  5 pancytopenia-likely chemotherapy related.  Recheck counts tomorrow prior to TEE cardioversion.  6 tobacco abuse-patient counseled on discontinuing.  For questions or updates, please contact [place] Please consult [email or web address] for contact info under     Signed, Kirk Ruths, MD  05/02/2018 2:28 PM

## 2018-05-02 NOTE — Progress Notes (Signed)
  Echocardiogram 2D Echocardiogram has been performed.  Brian White 05/02/2018, 12:18 PM

## 2018-05-02 NOTE — Progress Notes (Signed)
Patient's MEWS score is a 3. MD has been paged about patient's HR. Patient is asymptomatic. Will continue to monitor.

## 2018-05-03 ENCOUNTER — Other Ambulatory Visit: Payer: Self-pay

## 2018-05-03 ENCOUNTER — Encounter (HOSPITAL_COMMUNITY)
Admission: EM | Disposition: A | Discharge status: Home / Self Care | Payer: Self-pay | Source: Home / Self Care | Attending: Internal Medicine

## 2018-05-03 ENCOUNTER — Other Ambulatory Visit (HOSPITAL_COMMUNITY): Payer: BLUE CROSS/BLUE SHIELD

## 2018-05-03 LAB — PREPARE RBC (CROSSMATCH)

## 2018-05-03 LAB — BASIC METABOLIC PANEL
Anion gap: 9 (ref 5–15)
BUN: 21 mg/dL (ref 8–23)
CO2: 21 mmol/L — ABNORMAL LOW (ref 22–32)
Calcium: 8.4 mg/dL — ABNORMAL LOW (ref 8.9–10.3)
Chloride: 106 mmol/L (ref 98–111)
Creatinine, Ser: 0.99 mg/dL (ref 0.61–1.24)
GFR calc Af Amer: >60 mL/min (ref >60)
GFR calc non Af Amer: >60 mL/min (ref >60)
Glucose, Bld: 97 mg/dL (ref 70–99)
Potassium: 3.5 mmol/L (ref 3.5–5.1)
Sodium: 136 mmol/L (ref 135–145)

## 2018-05-03 LAB — RETICULOCYTES
Immature Retic Fract: 16 % — ABNORMAL HIGH (ref 2.3–15.9)
RBC.: 2.09 MIL/uL — ABNORMAL LOW (ref 4.22–5.81)
Retic Count, Absolute: 7.5 10*3/uL — ABNORMAL LOW (ref 19.0–186.0)
Retic Ct Pct: 0.4 % (ref 0.4–3.1)

## 2018-05-03 LAB — CBC WITH DIFFERENTIAL/PLATELET
Abs Immature Granulocytes: 0.03 10*3/uL (ref 0.00–0.07)
Basophils Absolute: 0 10*3/uL (ref 0.0–0.1)
Basophils Relative: 1 %
Eosinophils Absolute: 0 10*3/uL (ref 0.0–0.5)
Eosinophils Relative: 2 %
HCT: 23.5 % — ABNORMAL LOW (ref 39.0–52.0)
Hemoglobin: 7.1 g/dL — ABNORMAL LOW (ref 13.0–17.0)
Immature Granulocytes: 2 %
Lymphocytes Relative: 41 %
Lymphs Abs: 0.6 10*3/uL — ABNORMAL LOW (ref 0.7–4.0)
MCH: 34 pg (ref 26.0–34.0)
MCHC: 30.2 g/dL (ref 30.0–36.0)
MCV: 112.4 fL — ABNORMAL HIGH (ref 80.0–100.0)
Monocytes Absolute: 0.3 10*3/uL (ref 0.1–1.0)
Monocytes Relative: 18 %
Neutro Abs: 0.5 10*3/uL — ABNORMAL LOW (ref 1.7–7.7)
Neutrophils Relative %: 36 %
Platelets: 76 10*3/uL — ABNORMAL LOW (ref 150–400)
RBC: 2.09 MIL/uL — ABNORMAL LOW (ref 4.22–5.81)
RDW: 14.6 % (ref 11.5–15.5)
WBC: 1.5 10*3/uL — ABNORMAL LOW (ref 4.0–10.5)
nRBC: 0 % (ref 0.0–0.2)

## 2018-05-03 LAB — ABO/RH: ABO/RH(D): O POS

## 2018-05-03 LAB — FOLATE: Folate: 29 ng/mL (ref >5.9)

## 2018-05-03 LAB — HEPARIN LEVEL (UNFRACTIONATED): Heparin Unfractionated: 0.46 IU/mL (ref 0.30–0.70)

## 2018-05-03 LAB — VITAMIN B12: Vitamin B-12: 657 pg/mL (ref 180–914)

## 2018-05-03 LAB — MAGNESIUM: Magnesium: 1.6 mg/dL — ABNORMAL LOW (ref 1.7–2.4)

## 2018-05-03 SURGERY — ECHOCARDIOGRAM, TRANSESOPHAGEAL
Anesthesia: Monitor Anesthesia Care

## 2018-05-03 MED ORDER — SODIUM CHLORIDE 0.9% IV SOLUTION
Freq: Once | INTRAVENOUS | Status: AC
  Administered 2018-05-03: 15:00:00 via INTRAVENOUS

## 2018-05-03 MED ORDER — IPRATROPIUM-ALBUTEROL 0.5-2.5 (3) MG/3ML IN SOLN
3.0000 mL | Freq: Three times a day (TID) | RESPIRATORY_TRACT | Status: DC
  Administered 2018-05-03 (×2): 3 mL via RESPIRATORY_TRACT
  Filled 2018-05-03 (×3): qty 3

## 2018-05-03 MED ORDER — POTASSIUM CHLORIDE CRYS ER 20 MEQ PO TBCR
40.0000 meq | EXTENDED_RELEASE_TABLET | Freq: Once | ORAL | Status: AC
  Administered 2018-05-03: 40 meq via ORAL
  Filled 2018-05-03: qty 2

## 2018-05-03 MED ORDER — MAGNESIUM SULFATE 2 GM/50ML IV SOLN
2.0000 g | Freq: Once | INTRAVENOUS | Status: AC
  Administered 2018-05-03: 2 g via INTRAVENOUS
  Filled 2018-05-03: qty 50

## 2018-05-03 MED ORDER — APIXABAN 5 MG PO TABS
5.0000 mg | ORAL_TABLET | Freq: Two times a day (BID) | ORAL | Status: DC
  Administered 2018-05-03 – 2018-05-04 (×3): 5 mg via ORAL
  Filled 2018-05-03 (×3): qty 1

## 2018-05-03 MED ORDER — CARVEDILOL 3.125 MG PO TABS
3.1250 mg | ORAL_TABLET | Freq: Two times a day (BID) | ORAL | Status: DC
  Administered 2018-05-03 – 2018-05-04 (×2): 3.125 mg via ORAL
  Filled 2018-05-03 (×2): qty 1

## 2018-05-03 MED ORDER — POLYETHYLENE GLYCOL 3350 17 G PO PACK
17.0000 g | PACK | Freq: Every day | ORAL | Status: DC
  Administered 2018-05-03 – 2018-05-04 (×2): 17 g via ORAL
  Filled 2018-05-03 (×2): qty 1

## 2018-05-03 MED ORDER — SENNOSIDES-DOCUSATE SODIUM 8.6-50 MG PO TABS
1.0000 | ORAL_TABLET | Freq: Two times a day (BID) | ORAL | Status: DC
  Administered 2018-05-03 – 2018-05-04 (×3): 1 via ORAL
  Filled 2018-05-03 (×3): qty 1

## 2018-05-03 MED ORDER — FUROSEMIDE 10 MG/ML IJ SOLN
20.0000 mg | Freq: Once | INTRAMUSCULAR | Status: AC
  Administered 2018-05-03: 20 mg via INTRAVENOUS
  Filled 2018-05-03: qty 2

## 2018-05-03 NOTE — Care Management Important Message (Signed)
Important Message  Patient Details  Name: Brian White MRN: [id number] Date of Birth: August 02, 1950   Medicare Important Message Given:  Yes    [name] 05/03/2018, 3:44 PM

## 2018-05-03 NOTE — Progress Notes (Signed)
Pateint is to get 1 unit of blood, verbal order given for Type and screen per MD Erlinda Hong, F.Order placed

## 2018-05-03 NOTE — Progress Notes (Signed)
Progress Note  Patient Name: Brian White Date of Encounter: 05/03/2018  Primary Cardiologist: Dr [name]  Subjective   No CP or dyspnea  Inpatient Medications    Scheduled Meds: . folic acid  1 mg Oral Daily  . mirtazapine  15 mg Oral QHS  . mometasone-formoterol  2 puff Inhalation BID   Continuous Infusions: . sodium chloride 100 mL/hr at 05/02/18 2023  . sodium chloride    . diltiazem (CARDIZEM) infusion 10 mg/hr (05/02/18 2022)  . heparin 1,300 Units/hr (05/02/18 2024)   PRN Meds: acetaminophen, guaiFENesin, ondansetron (ZOFRAN) IV, sodium chloride flush   Vital Signs    Vitals:   05/02/18 1646 05/02/18 2001 05/02/18 2003 05/03/18 0423  BP: 124/76 105/73  102/64  Pulse: 60 77 68 86  Resp: (!) 22 18 18 18   Temp: 97.7 F (36.5 C) 98.3 F (36.8 C)  98 F (36.7 C)  TempSrc: Oral Oral  Oral  SpO2: 100% 99% 98% 94%  Weight:    92.6 kg  Height:        Intake/Output Summary (Last 24 hours) at 05/03/2018 0805 Last data filed at 05/03/2018 0526 Gross per 24 hour  Intake 3885.08 ml  Output 650 ml  Net 3235.08 ml   Filed Weights   05/01/18 1900 05/03/18 0423  Weight: 88.1 kg 92.6 kg    Telemetry    Sinus with PVCs - Personally Reviewed  Physical Exam   GEN: No acute distress.   Neck: No JVD Cardiac: RRR Respiratory: Clear to auscultation bilaterally. GI: Soft, nontender, non-distended  MS: Trace edema Neuro:  Nonfocal  Psych: Normal affect   Labs    Chemistry Recent Labs  Lab 05/01/18 1210 05/02/18 0354 05/03/18 0322  NA 137 140 136  K 4.0 3.8 3.5  CL 103 106 106  CO2 23 25 21*  GLUCOSE 125* 104* 97  BUN 29* 26* 21  CREATININE 0.96 1.00 0.99  CALCIUM 9.3 8.5* 8.4*  GFRNONAA >60 >60 >60  GFRAA >60 >60 >60  ANIONGAP 11 9 9      Hematology Recent Labs  Lab 05/01/18 1210 05/02/18 0354 05/03/18 0322  WBC 2.5* 1.6* 1.5*  RBC 2.68* 2.28* 2.09*  2.09*  HGB 9.3* 8.0* 7.1*  HCT 29.9* 25.4* 23.5*  MCV 111.6* 111.4* 112.4*  MCH  34.7* 35.1* 34.0  MCHC 31.1 31.5 30.2  RDW 14.6 14.6 14.6  PLT 165 104* 76*    Cardiac Enzymes Recent Labs  Lab 05/01/18 2341  TROPONINI 0.07*    Recent Labs  Lab 05/01/18 1222  TROPIPOC 0.06      Radiology    Dg Chest 2 View  Result Date: 05/01/2018 CLINICAL DATA:  Palpitations, chest pain. EXAM: CHEST - 2 VIEW COMPARISON:  Radiograph of October 18, 2015. FINDINGS: Stable cardiomediastinal silhouette. No pneumothorax or pleural effusion is noted. Interval placement of right internal jugular Port-A-Cath with distal tip in expected position of superior vena cava. Both lungs are clear. The visualized skeletal structures are unremarkable. IMPRESSION: Interval placement of right internal jugular Port-A-Cath. No acute cardiopulmonary abnormality seen. Electronically Signed   By: Marijo Conception, M.D.   On: 05/01/2018 12:55    Patient Profile     67 y.o. male with past medical history of hypertension, prostate cancer, stage IIIb non-small cell lung cancer being treated with chemotherapy admitted with atrial flutter.  Assessment & Plan    1 new onset typical atrial flutter-patient has converted to sinus rhythm.  I will discontinue heparin and treat with  apixaban 5 mg twice daily.  Discontinue Cardizem.  Begin carvedilol 3.125 mg twice daily.  We will refer to electrophysiology as an outpatient for consideration of ablation to avoid long-term anticoagulation particularly in light of need for chemotherapy which can cause thrombocytopenia.  TSH and free T4 normal.   2 cardiomyopathy-echocardiogram shows ejection fraction 25 to 30%.  Question tachycardia mediated.  Note there is also possible inferior infarct on ECG.  Therefore could be ischemia mediated.    Now that patient in sinus rhythm will discontinue Cardizem.  Add carvedilol 3.125 mg twice daily.  Add ARB versus Entresto tomorrow if blood pressure allows.  Titrate medications as an outpatient.  Would plan to repeat echocardiogram in 3  months now that patient in sinus rhythm.  If LV function remains decreased and prognosis from lung cancer reasonable could consider ischemia evaluation at that time.    3 pancytopenia-patient's pancytopenia slightly worse today.  Likely secondary to chemotherapy.  May need transfusion.  Will leave to primary care.  No evidence of active bleeding and will therefore continue anticoagulation for now.  If platelet count decreases further may need to hold apixaban.   4 stage IIIb non-small cell lung cancer-patient has had this diagnosis for 2 years and is doing well from a functional capacity.  Management per oncology.  5 pancytopenia-likely chemotherapy related.  Plan as outlined above.  6 tobacco abuse-patient previously counseled on discontinuing.  For questions or updates, please contact [place] Please consult [email or web address] for contact info under        Signed, Kirk Ruths, MD  05/03/2018, 8:05 AM

## 2018-05-03 NOTE — Progress Notes (Signed)
One unit of PRBC administered, patient tolerated well. Lasix administered after transfusion, as ordered.  Maui Britten, RN

## 2018-05-03 NOTE — Progress Notes (Signed)
Per CCMD patient converted back to NSR, EKG completed, in the chart. MD Crenshaw informed, at bedside.

## 2018-05-03 NOTE — Discharge Instructions (Signed)

## 2018-05-03 NOTE — Progress Notes (Signed)
PROGRESS NOTE  Brian White [facility]:[id number] DOB: Jun 28, 1951 DOA: 05/01/2018 PCP: [name], MD  HPI/Recap of past 24 hours:  Now back to sinus rhythm  he denies chest pain,  Cough is at baseline, he reports mild wheezing this am No fever Constipated  Wife at beside  Assessment/Plan: Principal Problem:   Atrial flutter ([place]) Active Problems:   HTN (hypertension)   COPD (chronic obstructive pulmonary disease) ([place])   Smoker   CAD (coronary artery disease)   HLD (hyperlipidemia)   Cancer of lower lobe of left lung ([place])   Anemia  Aflutter /RVR -New diagnosis -he does not appear symptomatic, unknown duration, he is found to have tachycardia at his pcp's office and was sent to the hospital for further eval -he denies h/o osa ( but never has sleep study) in the past, tsh 1.3 -was on cardizem drip, now on coreg -back to NSR, planned cardioversion cancelled, meds per cardiology, on  anticoagulation ( will need to monitor hgb/plt) -echo EF 20-30% -cardiology input appreciated  Cardiomyopathy: Tachycardia / CAD/cardiotoxicity from past chemo/XRT ? Med per cardiology  Pancytopenia (last chemo on 10/9) Anemia: hgb dropped from hgb 9.3-- 8--7, FOBT test pending, Transfuse prn to keep hgb >8, prbc x1 units onf 10/18 with lasix 20mg   Neutropenia: 2.5-1.6-1.5, ANC today 500, repeat in am, consider GCSF if does not improve Thrombocytopenia: platelet dropped from 165-104-76 Repeat labs in am  CAD On asa only at home Denies chest pain  HTN Home meds losartan /hctz held,  y bp stable  (was on cardizem drip, now on coreg)  nonsmall cell lung cancer Has been on [place], last dose on 10/9  COPD and active smoker Mild bilateral wheezing today, add scheduled nebs I have discussed tobacco cessation with the patient.  I have counseled the patient regarding the negative impacts of continued tobacco use including but not limited to lung cancer, COPD, and  cardiovascular disease.  I have discussed alternatives to tobacco and modalities that may help facilitate tobacco cessation including but not limited to biofeedback, hypnosis, and medications.  Total time spent with tobacco counseling was 4 minutes.   Constipation:  Stool softener  Code Status: Full  Family Communication: patient and wife  Disposition Plan: not ready to discharge, needs cardiology clearance   Consultants:  cardiology  Procedures:  none  Antibiotics:  none   Objective: BP 114/69 (BP Location: Right Arm)   Pulse 100   Temp 98 F (36.7 C) (Oral)   Resp 18   Ht 5\' 11"  (1.803 m)   Wt 92.6 kg   SpO2 96%   BMI 28.47 kg/m   Intake/Output Summary (Last 24 hours) at 05/03/2018 1336 Last data filed at 05/03/2018 1100 Gross per 24 hour  Intake 2686.67 ml  Output 450 ml  Net 2236.67 ml   Filed Weights   05/01/18 1900 05/03/18 0423  Weight: 88.1 kg 92.6 kg    Exam: Patient is examined daily including today on 05/03/2018, exams remain the same as of yesterday except that has changed    General:  NAD  Cardiovascular: RRR  Respiratory: mild bilateral wheezing   Abdomen: Soft/ND/NT, positive BS  Musculoskeletal: No Edema  Neuro: alert, oriented   Data Reviewed: Basic Metabolic Panel: Recent Labs  Lab 05/01/18 1210 05/02/18 0354 05/03/18 0322  NA 137 140 136  K 4.0 3.8 3.5  CL 103 106 106  CO2 23 25 21*  GLUCOSE 125* 104* 97  BUN 29* 26* 21  CREATININE 0.96  1.00 0.99  CALCIUM 9.3 8.5* 8.4*  MG  --   --  1.6*   Liver Function Tests: No results for input(s): AST, ALT, ALKPHOS, BILITOT, PROT, ALBUMIN in the last 168 hours. No results for input(s): LIPASE, AMYLASE in the last 168 hours. No results for input(s): AMMONIA in the last 168 hours. CBC: Recent Labs  Lab 05/01/18 1210 05/02/18 0354 05/03/18 0322  WBC 2.5* 1.6* 1.5*  NEUTROABS  --   --  0.5*  HGB 9.3* 8.0* 7.1*  HCT 29.9* 25.4* 23.5*  MCV 111.6* 111.4* 112.4*  PLT 165  104* 76*   Cardiac Enzymes:   Recent Labs  Lab 05/01/18 2341  TROPONINI 0.07*   BNP (last 3 results) No results for input(s): BNP in the last 8760 hours.  ProBNP (last 3 results) No results for input(s): PROBNP in the last 8760 hours.  CBG: No results for input(s): GLUCAP in the last 168 hours.  No results found for this or any previous visit (from the past 240 hour(s)).   Studies: No results found.  Scheduled Meds: . sodium chloride   Intravenous Once  . apixaban  5 mg Oral BID  . carvedilol  3.125 mg Oral BID WC  . folic acid  1 mg Oral Daily  . furosemide  20 mg Intravenous Once  . ipratropium-albuterol  3 mL Nebulization TID  . mirtazapine  15 mg Oral QHS  . mometasone-formoterol  2 puff Inhalation BID  . polyethylene glycol  17 g Oral Daily  . senna-docusate  1 tablet Oral BID    Continuous Infusions: . sodium chloride 20 mL/hr at 05/03/18 0850     Time spent: 52mins I have personally reviewed and interpreted on  05/03/2018 daily labs, tele strips, imagings as discussed above under date review session and assessment and plans.  I reviewed all nursing notes, pharmacy notes, consultant notes,  vitals, pertinent old records  I have discussed plan of care as described above with RN , patient and family on 05/03/2018   Florencia Reasons MD, PhD  [facility] Pager [phone]. If 7PM-7AM, please contact night-coverage at [email or web address], password [facility] 05/03/2018, 1:36 PM  LOS: 2 days

## 2018-05-04 DIAGNOSIS — I420 Dilated cardiomyopathy: Secondary | ICD-10-CM

## 2018-05-04 DIAGNOSIS — D649 Anemia, unspecified: Secondary | ICD-10-CM

## 2018-05-04 DIAGNOSIS — D61818 Other pancytopenia: Secondary | ICD-10-CM

## 2018-05-04 DIAGNOSIS — I10 Essential (primary) hypertension: Secondary | ICD-10-CM

## 2018-05-04 LAB — BASIC METABOLIC PANEL
Anion gap: 7 (ref 5–15)
BUN: 19 mg/dL (ref 8–23)
CO2: 25 mmol/L (ref 22–32)
Calcium: 8.7 mg/dL — ABNORMAL LOW (ref 8.9–10.3)
Chloride: 105 mmol/L (ref 98–111)
Creatinine, Ser: 1.03 mg/dL (ref 0.61–1.24)
GFR calc Af Amer: >60 mL/min (ref >60)
GFR calc non Af Amer: >60 mL/min (ref >60)
Glucose, Bld: 105 mg/dL — ABNORMAL HIGH (ref 70–99)
Potassium: 4.2 mmol/L (ref 3.5–5.1)
Sodium: 137 mmol/L (ref 135–145)

## 2018-05-04 LAB — BPAM RBC
Blood Product Expiration Date: 201911182359
ISSUE DATE / TIME: 201910181448
Unit Type and Rh: 5100

## 2018-05-04 LAB — TYPE AND SCREEN
ABO/RH(D): O POS
Antibody Screen: NEGATIVE
Unit division: 0

## 2018-05-04 LAB — CBC
HCT: 27.1 % — ABNORMAL LOW (ref 39.0–52.0)
Hemoglobin: 8.4 g/dL — ABNORMAL LOW (ref 13.0–17.0)
MCH: 33.9 pg (ref 26.0–34.0)
MCHC: 31 g/dL (ref 30.0–36.0)
MCV: 109.3 fL — ABNORMAL HIGH (ref 80.0–100.0)
Platelets: 62 10*3/uL — ABNORMAL LOW (ref 150–400)
RBC: 2.48 MIL/uL — ABNORMAL LOW (ref 4.22–5.81)
RDW: 15.6 % — ABNORMAL HIGH (ref 11.5–15.5)
WBC: 1.9 10*3/uL — ABNORMAL LOW (ref 4.0–10.5)
nRBC: 1 % — ABNORMAL HIGH (ref 0.0–0.2)

## 2018-05-04 LAB — MAGNESIUM: Magnesium: 2 mg/dL (ref 1.7–2.4)

## 2018-05-04 MED ORDER — CARVEDILOL 3.125 MG PO TABS: 3.1250 mg | ORAL_TABLET | Freq: Two times a day (BID) | ORAL | 0 refills | Status: DC

## 2018-05-04 MED ORDER — HEPARIN SOD (PORK) LOCK FLUSH 100 UNIT/ML IV SOLN: 500.0000 [IU] | INTRAVENOUS | Status: DC | PRN

## 2018-05-04 MED ORDER — LOSARTAN POTASSIUM 25 MG PO TABS: 25.0000 mg | ORAL_TABLET | Freq: Every day | ORAL | 0 refills | Status: DC

## 2018-05-04 MED ORDER — SPIRONOLACTONE 25 MG PO TABS: 12.5000 mg | ORAL_TABLET | Freq: Every day | ORAL | 0 refills | Status: DC

## 2018-05-04 MED ORDER — IPRATROPIUM-ALBUTEROL 0.5-2.5 (3) MG/3ML IN SOLN: 3.0000 mL | RESPIRATORY_TRACT | Status: DC | PRN

## 2018-05-04 MED ORDER — FUROSEMIDE 20 MG PO TABS: 20.0000 mg | ORAL_TABLET | Freq: Every day | ORAL | 0 refills | Status: DC | PRN

## 2018-05-04 MED ORDER — APIXABAN 5 MG PO TABS: 5.0000 mg | ORAL_TABLET | Freq: Two times a day (BID) | ORAL | 0 refills | Status: DC

## 2018-05-04 MED ORDER — LOSARTAN POTASSIUM 25 MG PO TABS
25.0000 mg | ORAL_TABLET | Freq: Every day | ORAL | Status: DC
  Administered 2018-05-04: 25 mg via ORAL
  Filled 2018-05-04: qty 1

## 2018-05-04 MED ORDER — SPIRONOLACTONE 12.5 MG HALF TABLET
12.5000 mg | ORAL_TABLET | Freq: Every day | ORAL | Status: DC
  Administered 2018-05-04: 12.5 mg via ORAL
  Filled 2018-05-04: qty 1

## 2018-05-04 NOTE — Progress Notes (Signed)
Pt port de accessed by IV team, pt telemetry removed. Discharge education provided at bedside with family. Pt has all belongings including eliquis card. Pt discharged via wheelchair with NT

## 2018-05-04 NOTE — Progress Notes (Signed)
Pt and family aware of needing stool sample  Hat placed in toilet  NT aware as well

## 2018-05-04 NOTE — Discharge Summary (Addendum)
Discharge Summary  Brian White OMV:[id number] DOB: Apr 17, 1951  PCP: [name], MD  Admit date: 05/01/2018 Discharge date: 05/04/2018  Time spent: 26mins, more than 50% time spent on coordination of care Case discussed with cardiology and oncology  Recommendations for Outpatient Follow-up:  1. F/u with PMD within a week  for hospital discharge follow up 2. F/u with oncology ( repeat cbc/bmp on Monday at oncology clinic) 3. F/u with cardiology for heart failure and EP for afib/aflutter  Discharge Diagnoses:  Active Hospital Problems   Diagnosis Date Noted  . Atrial flutter ([place]) 05/01/2018  . Anemia 05/01/2018  . Cancer of lower lobe of left lung ([place]) 10/28/2015  . HLD (hyperlipidemia) 01/25/2014  . Smoker   . CAD (coronary artery disease)   . HTN (hypertension) 06/26/2011  . COPD (chronic obstructive pulmonary disease) ([place]) 06/26/2011    Resolved Hospital Problems  No resolved problems to display.    Discharge Condition: stable  Diet recommendation: heart healthy  Filed Weights   05/01/18 1900 05/03/18 0423 05/04/18 0447  Weight: 88.1 kg 92.6 kg 93.2 kg    History of present illness: (per admitting MD Dr Steffanie Dunn) PCP: [name], MD Consultants:  none Patient coming from: home- lives with wife  Chief Complaint: Tachycardia  HPI: Brian White is a 67 y.o. male with medical history significant for recurrent NSCLC stage IIIb, T1bN3M0 s/p chemoradiation and currently on maintenance chemo q3weeks (Keytruda/Alimpta), HTN, COPD and currently smoking 2/3 ppd (89 year hx), Prostate CA 2013 s/p radiation, CAD, who presented to the ED from his PCP's office today due to asymptomatic tachycardia.  He went in for a routine annual exam and heart rate was found to be 145.  He does have a history of a low TSH but this was normal when checked today.  He denies any chest pain, worsening shortness of breath, worsened dyspnea on exertion, orthopnea, lower extremity  edema.  He did get more tired than usual while driving yesterday and had to pull over to take a 15 minute nap. However he denies lightheadedness / presyncope. Says his HR has always been low in the past and that he had the "heart of an athlete." Sister was recently diagnosed with atrial fib. He was recently started on remeron (1 week ago) for insomnia / depression. He has had progressive anemia over the past few months. He has a h/o CAD on his problem list - apparently he had a mild MI seen on stress test pt was unaware had happened.  ED Course: Systolic blood pressure was 85-90.  Heart rate was in the 130s to 140s.  Patient was asymptomatic in the ED.  EKG showed rate of 147, sinus tach vs A flutter. He was given IV fluids, metoprolol 2.5 mg IV x1 and then started on a diltiazem drip at 5 mg/h.  Subsequently, heart rate was in the 70s to 90s, blood pressure improved to 90s to 100s over 60s to 70s.  F/u EKG showed rate 76, more pronounced a flutter with predominant 4:1 AV block.   Hospital Course:  Principal Problem:   Atrial flutter ([place]) Active Problems:   HTN (hypertension)   COPD (chronic obstructive pulmonary disease) ([facility])   Smoker   CAD (coronary artery disease)   HLD (hyperlipidemia)   Cancer of lower lobe of left lung ([facility])   Anemia  Aflutter /RVR -New diagnosis -he does not appear symptomatic, unknown duration, he is found to have tachycardia at his pcp's office and was sent to the hospital  for further eval -he denies h/o osa ( but never has sleep study) in the past, tsh 1.3 -was on cardizem drip, now on coreg -back to NSR, planned cardioversion cancelled, meds per cardiology, on  anticoagulation ( will need to monitor hgb/plt) -echo EF 20-30% -cardiology input appreciated -per cardiology "We will refer to electrophysiology as an outpatient for consideration of ablation to avoid long-term anticoagulation particularly in light of need for chemotherapy which can cause  thrombocytopenia"  Cardiomyopathy: -echo EF 20-30% Tachycardia / CAD/cardiotoxicity from past chemo/XRT ? Med per cardiology per cardiology: "Continue  carvedilol 3.125 mg twice daily.   - BP soft for entresto, I will start losartan 25 mg po daily and spironolactone 12.5 mg po daily, he will need repeat labs in 1 week - BMP - plan to repeat echocardiogram in 3 months now that patient in sinus rhythm.  - If LV function remains decreased and prognosis from lung cancer reasonable could consider ischemia evaluation at that time. "   Pancytopenia (last chemo on 10/9) Anemia: hgb dropped from hgb 9.3-- 8--7 -8.4, stool is brown, no visible blood, s/p prbc x1 units onf 10/18 with lasix 20mg   Neutropenia: 2.5-1.6-1.-1.9  Thrombocytopenia: platelet dropped from 165-104-76-62 Case discussed with oncology prior to discharge, patient is to recheck lab on Monday at oncology clinic to follow up cbc Will need to hold eliquis if plt less than 50 or sign of bleeding, will defer to oncology Patient is aware of coming back to the hospital if active bleed.   CAD On asa only at home, asa d/ced for now Denies chest pain  HTN  bp stable , he is discharged on coreg3.125bid /losartan 25mg  daily and spironolactone 12.5mg  daily per cardiology recommendation  nonsmall cell lung cancer Has been on alimta and [place], last dose on 10/9 Follow up with oncology  COPD and active smoker Stable, has chronic cough but nothing new No wheezing at discharge   Constipation:  Resolved on Stool softener  Code Status: Full  Family Communication: patient and wife  Disposition Plan: d/c home with cardiology clearance   Consultants:  cardiology  Procedures:  none  Antibiotics:  none   Discharge Exam: BP 113/82   Pulse 76   Temp (!) 97.1 F (36.2 C) (Oral)   Resp (!) 22   Ht 5\' 11"  (1.803 m)   Wt 93.2 kg   SpO2 98%   BMI 28.66 kg/m   General: NAD, mild hearing impairment    Cardiovascular: RRR Respiratory: CTABL  Discharge Instructions You were cared for by a hospitalist during your hospital stay. If you have any questions about your discharge medications or the care you received while you were in the hospital after you are discharged, you can call the unit and asked to speak with the hospitalist on call if the hospitalist that took care of you is not available. Once you are discharged, your primary care physician will handle any further medical issues. Please note that NO REFILLS for any discharge medications will be authorized once you are discharged, as it is imperative that you return to your primary care physician (or establish a relationship with a primary care physician if you do not have one) for your aftercare needs so that they can reassess your need for medications and monitor your lab values.  Discharge Instructions    Diet - low sodium heart healthy   Complete by:  As directed    Increase activity slowly   Complete by:  As directed  Allergies as of 05/04/2018      Reactions   Onion Anaphylaxis         Medication List    STOP taking these medications   aspirin 81 MG tablet   ibuprofen 200 MG tablet Commonly known as:  ADVIL,MOTRIN   losartan-hydrochlorothiazide 100-25 MG tablet Commonly known as:  HYZAAR     TAKE these medications   apixaban 5 MG Tabs tablet Commonly known as:  ELIQUIS Take 1 tablet (5 mg total) by mouth 2 (two) times daily.   budesonide-formoterol 160-4.5 MCG/ACT inhaler Commonly known as:  SYMBICORT Inhale 1 puff into the lungs 2 (two) times daily.   carvedilol 3.125 MG tablet Commonly known as:  COREG Take 1 tablet (3.125 mg total) by mouth 2 (two) times daily with a meal.   cetirizine 10 MG chewable tablet Commonly known as:  ZYRTEC Chew 10 mg by mouth daily.   dexamethasone 4 MG tablet Commonly known as:  DECADRON 4 mg by mouth twice a day the day before, day of and after the chemotherapy every 3  weeks.   diphenhydrAMINE 25 MG tablet Commonly known as:  BENADRYL Take 25 mg by mouth at bedtime as needed for sleep.   folic acid 1 MG tablet Commonly known as:  FOLVITE TAKE 1 TABLET(1 MG) BY MOUTH DAILY   furosemide 20 MG tablet Commonly known as:  LASIX Take 1 tablet (20 mg total) by mouth daily as needed for fluid or edema.   guaiFENesin 600 MG 12 hr tablet Commonly known as:  MUCINEX Take by mouth 2 (two) times daily.   hydrOXYzine 10 MG tablet Commonly known as:  ATARAX/VISTARIL Take 1 tablet (10 mg total) by mouth 3 (three) times daily as needed.   lidocaine-prilocaine cream Commonly known as:  EMLA Apply 1 application topically as needed. Squeeze a  small amount of cream to cotton ball and apply to port site 1-2 hours prior to chemotherapy. Do not rub in the cream and cover  with plastic wrap.   losartan 25 MG tablet Commonly known as:  COZAAR Take 1 tablet (25 mg total) by mouth daily. Start taking on:  05/05/2018   mirtazapine 15 MG tablet Commonly known as:  REMERON Take 1 tablet (15 mg total) by mouth at bedtime.   prochlorperazine 10 MG tablet Commonly known as:  COMPAZINE Take 1 tablet (10 mg total) by mouth every 6 (six) hours as needed for nausea or vomiting.   spironolactone 25 MG tablet Commonly known as:  ALDACTONE Take 0.5 tablets (12.5 mg total) by mouth daily. Start taking on:  05/05/2018      Allergies  Allergen Reactions  . Onion Anaphylaxis        Follow-up Information    Constance Haw, MD On 05/21/2018.   Specialty:  Cardiology Why:  at [facility] information: [address] [place] [address] [phone]        [name], MD Follow up.   Specialty:  Family Medicine Contact information: [place] [address] [phone]        [name], MD Follow up.   Specialty:  Oncology Contact information: [place]  [address] [phone]        [facility] [employer]. Call in 1 week(s).   Specialty:  Cardiology Why:  follow up with cardiology Dr [name] or a cardiology in [place]. cardiology to continue adjust your heart medication Contact information: [address] [place] [phone]       please check your labs on monday at the cancer center Follow up.   Why:  cbc/bmp           The results of significant diagnostics from this hospitalization (including imaging, microbiology, ancillary and laboratory) are listed below for reference.    Significant Diagnostic Studies: Dg Chest 2 View  Result Date: 05/01/2018 CLINICAL DATA:  Palpitations, chest pain. EXAM: CHEST - 2 VIEW COMPARISON:  Radiograph of October 18, 2015. FINDINGS: Stable cardiomediastinal silhouette. No pneumothorax or pleural effusion is noted. Interval placement of right internal jugular Port-A-Cath with distal tip in expected position of superior vena cava. Both lungs are clear. The visualized skeletal structures are unremarkable. IMPRESSION: Interval placement of right internal jugular Port-A-Cath. No acute cardiopulmonary abnormality seen. Electronically Signed   By: Marijo Conception, M.D.   On: 05/01/2018 12:55   Ct Chest W Contrast  Addendum Date: 04/24/2018   ADDENDUM REPORT: 04/24/2018 14:19 ADDENDUM: The "Mediastinum/Nodes" section of the chest CT report is inaccurate. The AP window lymph node measured 2.1 x 2.5 cm on the exam from 02/19/2018. This same lymph node now measures 2.4 x 2.9 cm on the exam from 04/22/2018. As reported in the "Impression" section of this report, this AP window lymph node has progressed in the 2 month interval since 02/19/2018. Electronically Signed   By: Misty Stanley M.D.   On: 04/24/2018 14:19   Result Date: 04/24/2018 CLINICAL DATA:  Left-sided lung cancer. EXAM: CT CHEST, ABDOMEN, AND PELVIS WITH CONTRAST TECHNIQUE: Multidetector CT imaging  of the chest, abdomen and pelvis was performed following the standard protocol during bolus administration of intravenous contrast. CONTRAST:  186mL OMNIPAQUE IOHEXOL 300 MG/ML  SOLN COMPARISON:  Chest CT 02/19/2018.  PET-CT 03/08/2017 FINDINGS: CT CHEST FINDINGS Cardiovascular: The heart size is normal. No substantial pericardial effusion. Coronary artery calcification is evident. Endocardial calcification in the left ventricle suggest prior infarct. Atherosclerotic calcification is noted in the wall of the thoracic aorta. Right Port-A-Cath tip is positioned in the proximal SVC. Mediastinum/Nodes: 2.4 x 2.9 cm AP window lymph node measured on the previous study measures 2.1 x 2.5 cm today. No other hilar mediastinal lymphadenopathy. There is no hilar lymphadenopathy. Soft tissue fullness in the inferior left hilum is stable. The esophagus has normal imaging features. There is no axillary lymphadenopathy. Lungs/Pleura: The central tracheobronchial airways are patent. Architectural distortion and scarring in the infrahilar left lower lobe is similar to prior with no change in the 1.1 x 1.9 cm nodular component seen on image 99/series 4. No pleural effusion. No focal consolidation. Musculoskeletal: No worrisome lytic or sclerotic osseous abnormality. CT ABDOMEN PELVIS FINDINGS Hepatobiliary: No focal abnormality within the liver parenchyma. Gallbladder nondistended which may account for the appearance of wall thickening. This is similar to prior study of 02/19/2018. No evidence for gallstones. No intrahepatic or extrahepatic biliary dilation. Pancreas: No focal mass lesion. No dilatation of the main duct. No intraparenchymal cyst. No peripancreatic edema. Spleen: No splenomegaly. No focal mass lesion. Adrenals/Urinary Tract: No adrenal nodule or mass. Right kidney unremarkable. 6 mm low-density cortical lesion left kidney is too small to characterize but is likely benign. No evidence for hydroureter. Nodular soft  tissue at the bladder base is stable since prior PET-CT may be related to prostatic hypertrophy. Stomach/Bowel: Stomach is nondistended. No gastric wall thickening. No evidence of outlet obstruction. Duodenum is normally positioned as is the ligament of Treitz. No  small bowel wall thickening. No small bowel dilatation. The terminal ileum is normal. The appendix is normal. No gross colonic mass. No colonic wall thickening. No substantial diverticular change. Vascular/Lymphatic: There is abdominal aortic atherosclerosis without aneurysm. 14 mm short axis gastrohepatic ligament lymph node is stable since 02/19/2018. Other borderline lymphadenopathy in the upper abdomen is similar. 14 mm para-aortic lymph node (77/2) has increased from 8 mm short axis on 03/08/2017. 16 mm short axis aortocaval lymph node (72/2) is new since 03/08/2017. Multiple other upper normal retroperitoneal lymph nodes are suspicious. No pelvic sidewall lymphadenopathy. Reproductive: Prostate gland is enlarged.  Fiducial markers evident. Other: No intraperitoneal free fluid. Musculoskeletal: Bilateral groin hernias contain only fat. No worrisome lytic or sclerotic osseous abnormality. IMPRESSION: 1. Interval progression of AP window disease now measuring up to 2.9 cm. 2. Stable gastrohepatic ligament lymphadenopathy since 02/19/2018, but new since 03/08/2017. Retroperitoneal lymphadenopathy was not included on the chest CT of 02/19/2018 but is new since PET-CT of 03/08/2017. 3. Stable infrahilar left lung opacity, likely treatment related. 4. Soft tissue nodularity associated with the bladder base likely related to prostatic enlargement but urothelial lesion cannot be completely excluded by CT. Electronically Signed: By: Misty Stanley M.D. On: 04/23/2018 09:34   Ct Abdomen Pelvis W Contrast  Addendum Date: 04/24/2018   ADDENDUM REPORT: 04/24/2018 14:19 ADDENDUM: The "Mediastinum/Nodes" section of the chest CT report is inaccurate. The AP window  lymph node measured 2.1 x 2.5 cm on the exam from 02/19/2018. This same lymph node now measures 2.4 x 2.9 cm on the exam from 04/22/2018. As reported in the "Impression" section of this report, this AP window lymph node has progressed in the 2 month interval since 02/19/2018. Electronically Signed   By: Misty Stanley M.D.   On: 04/24/2018 14:19   Result Date: 04/24/2018 CLINICAL DATA:  Left-sided lung cancer. EXAM: CT CHEST, ABDOMEN, AND PELVIS WITH CONTRAST TECHNIQUE: Multidetector CT imaging of the chest, abdomen and pelvis was performed following the standard protocol during bolus administration of intravenous contrast. CONTRAST:  139mL OMNIPAQUE IOHEXOL 300 MG/ML  SOLN COMPARISON:  Chest CT 02/19/2018.  PET-CT 03/08/2017 FINDINGS: CT CHEST FINDINGS Cardiovascular: The heart size is normal. No substantial pericardial effusion. Coronary artery calcification is evident. Endocardial calcification in the left ventricle suggest prior infarct. Atherosclerotic calcification is noted in the wall of the thoracic aorta. Right Port-A-Cath tip is positioned in the proximal SVC. Mediastinum/Nodes: 2.4 x 2.9 cm AP window lymph node measured on the previous study measures 2.1 x 2.5 cm today. No other hilar mediastinal lymphadenopathy. There is no hilar lymphadenopathy. Soft tissue fullness in the inferior left hilum is stable. The esophagus has normal imaging features. There is no axillary lymphadenopathy. Lungs/Pleura: The central tracheobronchial airways are patent. Architectural distortion and scarring in the infrahilar left lower lobe is similar to prior with no change in the 1.1 x 1.9 cm nodular component seen on image 99/series 4. No pleural effusion. No focal consolidation. Musculoskeletal: No worrisome lytic or sclerotic osseous abnormality. CT ABDOMEN PELVIS FINDINGS Hepatobiliary: No focal abnormality within the liver parenchyma. Gallbladder nondistended which may account for the appearance of wall thickening. This  is similar to prior study of 02/19/2018. No evidence for gallstones. No intrahepatic or extrahepatic biliary dilation. Pancreas: No focal mass lesion. No dilatation of the main duct. No intraparenchymal cyst. No peripancreatic edema. Spleen: No splenomegaly. No focal mass lesion. Adrenals/Urinary Tract: No adrenal nodule or mass. Right kidney unremarkable. 6 mm low-density cortical lesion left kidney is too  small to characterize but is likely benign. No evidence for hydroureter. Nodular soft tissue at the bladder base is stable since prior PET-CT may be related to prostatic hypertrophy. Stomach/Bowel: Stomach is nondistended. No gastric wall thickening. No evidence of outlet obstruction. Duodenum is normally positioned as is the ligament of Treitz. No small bowel wall thickening. No small bowel dilatation. The terminal ileum is normal. The appendix is normal. No gross colonic mass. No colonic wall thickening. No substantial diverticular change. Vascular/Lymphatic: There is abdominal aortic atherosclerosis without aneurysm. 14 mm short axis gastrohepatic ligament lymph node is stable since 02/19/2018. Other borderline lymphadenopathy in the upper abdomen is similar. 14 mm para-aortic lymph node (77/2) has increased from 8 mm short axis on 03/08/2017. 16 mm short axis aortocaval lymph node (72/2) is new since 03/08/2017. Multiple other upper normal retroperitoneal lymph nodes are suspicious. No pelvic sidewall lymphadenopathy. Reproductive: Prostate gland is enlarged.  Fiducial markers evident. Other: No intraperitoneal free fluid. Musculoskeletal: Bilateral groin hernias contain only fat. No worrisome lytic or sclerotic osseous abnormality. IMPRESSION: 1. Interval progression of AP window disease now measuring up to 2.9 cm. 2. Stable gastrohepatic ligament lymphadenopathy since 02/19/2018, but new since 03/08/2017. Retroperitoneal lymphadenopathy was not included on the chest CT of 02/19/2018 but is new since PET-CT  of 03/08/2017. 3. Stable infrahilar left lung opacity, likely treatment related. 4. Soft tissue nodularity associated with the bladder base likely related to prostatic enlargement but urothelial lesion cannot be completely excluded by CT. Electronically Signed: By: Misty Stanley M.D. On: 04/23/2018 09:34    Microbiology: No results found for this or any previous visit (from the past 240 hour(s)).   Labs: Basic Metabolic Panel: Recent Labs  Lab 05/01/18 1210 05/02/18 0354 05/03/18 0322 05/04/18 0440  NA 137 140 136 137  K 4.0 3.8 3.5 4.2  CL 103 106 106 105  CO2 23 25 21* 25  GLUCOSE 125* 104* 97 105*  BUN 29* 26* 21 19  CREATININE 0.96 1.00 0.99 1.03  CALCIUM 9.3 8.5* 8.4* 8.7*  MG  --   --  1.6* 2.0   Liver Function Tests: No results for input(s): AST, ALT, ALKPHOS, BILITOT, PROT, ALBUMIN in the last 168 hours. No results for input(s): LIPASE, AMYLASE in the last 168 hours. No results for input(s): AMMONIA in the last 168 hours. CBC: Recent Labs  Lab 05/01/18 1210 05/02/18 0354 05/03/18 0322 05/04/18 0440  WBC 2.5* 1.6* 1.5* 1.9*  NEUTROABS  --   --  0.5*  --   HGB 9.3* 8.0* 7.1* 8.4*  HCT 29.9* 25.4* 23.5* 27.1*  MCV 111.6* 111.4* 112.4* 109.3*  PLT 165 104* 76* 62*   Cardiac Enzymes: Recent Labs  Lab 05/01/18 2341  TROPONINI 0.07*   BNP: BNP (last 3 results) No results for input(s): BNP in the last 8760 hours.  ProBNP (last 3 results) No results for input(s): PROBNP in the last 8760 hours.  CBG: No results for input(s): GLUCAP in the last 168 hours.     Signed:  Florencia Reasons MD, PhD  [facility] 05/04/2018, 2:55 PM

## 2018-05-04 NOTE — Progress Notes (Signed)
Patient and wife provided with 30 day Eliquis card. No other CM needs identified.

## 2018-05-04 NOTE — Progress Notes (Signed)
Paged MD Erlinda Hong regarding family request. Family states MD informed them they would be discharged at 61, family is upset  Education provided to family, family understanding at this time  Awaiting MD call back

## 2018-05-04 NOTE — Progress Notes (Addendum)
Progress Note  Patient Name: Brian White Date of Encounter: 05/04/2018  Primary Cardiologist: Dr [name]  Subjective   No CP or dyspnea, he wishes to go home  Inpatient Medications    Scheduled Meds: . apixaban  5 mg Oral BID  . carvedilol  3.125 mg Oral BID WC  . folic acid  1 mg Oral Daily  . mirtazapine  15 mg Oral QHS  . mometasone-formoterol  2 puff Inhalation BID  . polyethylene glycol  17 g Oral Daily  . senna-docusate  1 tablet Oral BID   Continuous Infusions:  PRN Meds: acetaminophen, guaiFENesin, ipratropium-albuterol, ondansetron (ZOFRAN) IV, sodium chloride flush   Vital Signs    Vitals:   05/04/18 0447 05/04/18 0449 05/04/18 0806 05/04/18 0823  BP:  132/81  118/67  Pulse:  79  78  Resp:  (!) 22  18  Temp:  98.6 F (37 C)  99 F (37.2 C)  TempSrc:  Oral  Oral  SpO2:  100% 98% 99%  Weight: 93.2 kg     Height:        Intake/Output Summary (Last 24 hours) at 05/04/2018 1106 Last data filed at 05/04/2018 0600 Gross per 24 hour  Intake 1372.7 ml  Output 800 ml  Net 572.7 ml   Filed Weights   05/01/18 1900 05/03/18 0423 05/04/18 0447  Weight: 88.1 kg 92.6 kg 93.2 kg    Telemetry    Sinus with PVCs - Personally Reviewed  Physical Exam   GEN: No acute distress.   Neck: No JVD Cardiac: RRR Respiratory: Clear to auscultation bilaterally. GI: Soft, nontender, non-distended  MS: Trace edema Neuro:  Nonfocal  Psych: Normal affect   Labs    Chemistry Recent Labs  Lab 05/02/18 0354 05/03/18 0322 05/04/18 0440  NA 140 136 137  K 3.8 3.5 4.2  CL 106 106 105  CO2 25 21* 25  GLUCOSE 104* 97 105*  BUN 26* 21 19  CREATININE 1.00 0.99 1.03  CALCIUM 8.5* 8.4* 8.7*  GFRNONAA >60 >60 >60  GFRAA >60 >60 >60  ANIONGAP 9 9 7      Hematology Recent Labs  Lab 05/02/18 0354 05/03/18 0322 05/04/18 0440  WBC 1.6* 1.5* 1.9*  RBC 2.28* 2.09*  2.09* 2.48*  HGB 8.0* 7.1* 8.4*  HCT 25.4* 23.5* 27.1*  MCV 111.4* 112.4* 109.3*  MCH 35.1*  34.0 33.9  MCHC 31.5 30.2 31.0  RDW 14.6 14.6 15.6*  PLT 104* 76* 62*    Cardiac Enzymes Recent Labs  Lab 05/01/18 2341  TROPONINI 0.07*    Recent Labs  Lab 05/01/18 1222  TROPIPOC 0.06      Radiology    No results found.  Patient Profile     67 y.o. male with past medical history of hypertension, prostate cancer, stage IIIb non-small cell lung cancer being treated with chemotherapy admitted with atrial flutter.  Assessment & Plan    1 new onset typical atrial flutter-patient has converted to sinus rhythm.  He remains in SR overnight. - hold apixaban in the settings of severe thrombocytopenia and anemia requiring infusions, he should follow with his oncologist for repeat labs, ? Necessity for transfusions next week, we will reassess at the visit on 11/5 and hopefully restart eliquis then - continue carvedilol 3.125 mg twice daily.  - We will refer to electrophysiology as an outpatient for consideration of ablation to avoid long-term anticoagulation particularly in light of need for chemotherapy which can cause thrombocytopenia.  TSH and free T4 normal.  2 cardiomyopathy-echocardiogram shows ejection fraction 25 to 30%.  Question tachycardia mediated.  Note there is also possible inferior infarct on ECG.  Therefore could be ischemia mediated.  Continue  carvedilol 3.125 mg twice daily.   - BP soft for entresto, I will start losartan 25 mg po daily and spironolactone 12.5 mg po daily, he will need repeat labs in 1 week - BMP - plan to repeat echocardiogram in 3 months now that patient in sinus rhythm.  - If LV function remains decreased and prognosis from lung cancer reasonable could consider ischemia evaluation at that time.    3 pancytopenia-patient's pancytopenia slightly worse today.  Likely secondary to chemotherapy.  May need transfusion.  Will leave to primary care.  No evidence of active bleeding and will therefore continue anticoagulation for now.  If platelet count  decreases further may need to hold apixaban.   4 stage IIIb non-small cell lung cancer-patient has had this diagnosis for 2 years and is doing well from a functional capacity.  Management per oncology.  5 pancytopenia-likely chemotherapy related.  Plan as outlined above.  6 tobacco abuse-patient previously counseled on discontinuing.  The patient can be discharged today, he has an appointment scheduled with our EP Dr [name] for 05/21/2018.  For questions or updates, please contact [place] Please consult [email or web address] for contact info under        Signed, Ena Dawley, MD  05/04/2018, 11:06 AM

## 2018-05-06 ENCOUNTER — Telehealth: Payer: Self-pay

## 2018-05-06 ENCOUNTER — Inpatient Hospital Stay: Payer: BLUE CROSS/BLUE SHIELD | Admitting: Family Medicine

## 2018-05-06 NOTE — Telephone Encounter (Signed)
Left message for patient to call back to complete TCM call-Anastasiya V Hopkins, RMA

## 2018-05-07 ENCOUNTER — Inpatient Hospital Stay: Payer: BLUE CROSS/BLUE SHIELD

## 2018-05-07 ENCOUNTER — Other Ambulatory Visit: Payer: Self-pay | Admitting: *Deleted

## 2018-05-07 ENCOUNTER — Telehealth: Payer: Self-pay | Admitting: Internal Medicine

## 2018-05-07 DIAGNOSIS — C3432 Malignant neoplasm of lower lobe, left bronchus or lung: Secondary | ICD-10-CM

## 2018-05-07 DIAGNOSIS — F329 Major depressive disorder, single episode, unspecified: Secondary | ICD-10-CM | POA: Diagnosis not present

## 2018-05-07 DIAGNOSIS — D649 Anemia, unspecified: Secondary | ICD-10-CM | POA: Diagnosis not present

## 2018-05-07 DIAGNOSIS — Z5111 Encounter for antineoplastic chemotherapy: Secondary | ICD-10-CM | POA: Diagnosis present

## 2018-05-07 DIAGNOSIS — Z5112 Encounter for antineoplastic immunotherapy: Secondary | ICD-10-CM | POA: Diagnosis not present

## 2018-05-07 DIAGNOSIS — I4891 Unspecified atrial fibrillation: Secondary | ICD-10-CM | POA: Diagnosis not present

## 2018-05-07 LAB — CBC WITH DIFFERENTIAL (CANCER CENTER ONLY)
Abs Immature Granulocytes: 0.01 10*3/uL (ref 0.00–0.07)
Basophils Absolute: 0 10*3/uL (ref 0.0–0.1)
Basophils Relative: 0 %
Eosinophils Absolute: 0.1 10*3/uL (ref 0.0–0.5)
Eosinophils Relative: 2 %
HCT: 27.7 % — ABNORMAL LOW (ref 39.0–52.0)
Hemoglobin: 8.8 g/dL — ABNORMAL LOW (ref 13.0–17.0)
Immature Granulocytes: 0 %
Lymphocytes Relative: 22 %
Lymphs Abs: 0.7 10*3/uL (ref 0.7–4.0)
MCH: 34.6 pg — ABNORMAL HIGH (ref 26.0–34.0)
MCHC: 31.8 g/dL (ref 30.0–36.0)
MCV: 109.1 fL — ABNORMAL HIGH (ref 80.0–100.0)
Monocytes Absolute: 0.7 10*3/uL (ref 0.1–1.0)
Monocytes Relative: 24 %
Neutro Abs: 1.6 10*3/uL — ABNORMAL LOW (ref 1.7–7.7)
Neutrophils Relative %: 52 %
Platelet Count: 74 10*3/uL — ABNORMAL LOW (ref 150–400)
RBC: 2.54 MIL/uL — ABNORMAL LOW (ref 4.22–5.81)
RDW: 15.3 % (ref 11.5–15.5)
WBC Count: 3.2 10*3/uL — ABNORMAL LOW (ref 4.0–10.5)
nRBC: 0 % (ref 0.0–0.2)

## 2018-05-07 LAB — CMP (CANCER CENTER ONLY)
ALT: 28 U/L (ref 0–44)
AST: 21 U/L (ref 15–41)
Albumin: 3.1 g/dL — ABNORMAL LOW (ref 3.5–5.0)
Alkaline Phosphatase: 101 U/L (ref 38–126)
Anion gap: 8 (ref 5–15)
BUN: 14 mg/dL (ref 8–23)
CO2: 29 mmol/L (ref 22–32)
Calcium: 9.5 mg/dL (ref 8.9–10.3)
Chloride: 104 mmol/L (ref 98–111)
Creatinine: 0.84 mg/dL (ref 0.61–1.24)
GFR, Est AFR Am: >60 mL/min (ref >60)
GFR, Estimated: >60 mL/min (ref >60)
Glucose, Bld: 129 mg/dL — ABNORMAL HIGH (ref 70–99)
Potassium: 4.1 mmol/L (ref 3.5–5.1)
Sodium: 141 mmol/L (ref 135–145)
Total Bilirubin: 0.4 mg/dL (ref 0.3–1.2)
Total Protein: 6.3 g/dL — ABNORMAL LOW (ref 6.5–8.1)

## 2018-05-07 MED ORDER — SODIUM CHLORIDE 0.9% FLUSH
10.0000 mL | INTRAVENOUS | Status: DC | PRN
  Administered 2018-05-07: 10 mL via INTRAVENOUS
  Filled 2018-05-07: qty 10

## 2018-05-07 MED ORDER — HEPARIN SOD (PORK) LOCK FLUSH 100 UNIT/ML IV SOLN
500.0000 [IU] | Freq: Once | INTRAVENOUS | Status: AC | PRN
  Administered 2018-05-07: 500 [IU] via INTRAVENOUS
  Filled 2018-05-07: qty 5

## 2018-05-07 NOTE — Telephone Encounter (Signed)
Lm requesting return call to complete TCM and confirm hosp f/u appt  

## 2018-05-07 NOTE — Telephone Encounter (Signed)
Spoke to pt regarding appts per 10/21 sch message

## 2018-05-09 ENCOUNTER — Encounter: Payer: Self-pay | Admitting: Family Medicine

## 2018-05-09 ENCOUNTER — Ambulatory Visit (INDEPENDENT_AMBULATORY_CARE_PROVIDER_SITE_OTHER): Payer: BLUE CROSS/BLUE SHIELD | Admitting: Family Medicine

## 2018-05-09 VITALS — BP 120/64 | HR 51 | Temp 98.2°F | Ht 69.5 in | Wt 204.2 lb

## 2018-05-09 DIAGNOSIS — E785 Hyperlipidemia, unspecified: Secondary | ICD-10-CM

## 2018-05-09 DIAGNOSIS — R7989 Other specified abnormal findings of blood chemistry: Secondary | ICD-10-CM

## 2018-05-09 DIAGNOSIS — D649 Anemia, unspecified: Secondary | ICD-10-CM

## 2018-05-09 DIAGNOSIS — C3432 Malignant neoplasm of lower lobe, left bronchus or lung: Secondary | ICD-10-CM

## 2018-05-09 DIAGNOSIS — I2510 Atherosclerotic heart disease of native coronary artery without angina pectoris: Secondary | ICD-10-CM

## 2018-05-09 DIAGNOSIS — I10 Essential (primary) hypertension: Secondary | ICD-10-CM

## 2018-05-09 DIAGNOSIS — F17200 Nicotine dependence, unspecified, uncomplicated: Secondary | ICD-10-CM

## 2018-05-09 DIAGNOSIS — I700 Atherosclerosis of aorta: Secondary | ICD-10-CM | POA: Diagnosis not present

## 2018-05-09 DIAGNOSIS — I4892 Unspecified atrial flutter: Secondary | ICD-10-CM | POA: Diagnosis not present

## 2018-05-09 NOTE — Assessment & Plan Note (Addendum)
Continued smoker, precontemplative.

## 2018-05-09 NOTE — Assessment & Plan Note (Addendum)
Off statin. Will need FLP updated - did not check today.

## 2018-05-09 NOTE — Assessment & Plan Note (Addendum)
Recent hospitalization for this, records and updated med list reviewed with patient. Now rate controlled. Continue eliquis anticoagulation, as well as coreg rate control, low dose spironolactone and losartan. He will schedule f/u with cardiology and has f/u planned with EP to discuss possible ablation in hopes of discontinuing eliquis.

## 2018-05-09 NOTE — Assessment & Plan Note (Signed)
Chronic, stable on current regimen.  

## 2018-05-09 NOTE — Progress Notes (Signed)
BP 120/64 (BP Location: Right Arm, Patient Position: Sitting, Cuff Size: Normal)   Pulse (!) 51   Temp 98.2 F (36.8 C) (Oral)   Ht 5' 9.5" (1.765 m)   Wt 204 lb 4 oz (92.6 kg)   SpO2 98%   BMI 29.73 kg/m    CC: hosp f/u visit Subjective:    Patient ID: Brian White, male    DOB: Mar 22, 1951, 67 y.o.   MRN: [id number]  HPI: Brian White is a 67 y.o. male presenting on 05/09/2018 for Hospitalization Follow-up   See prior note for details. Seen here for routine office visit, found to be tachycardic 140s - sent to ER. Records reviewed. Given metoprolol, IVF, started on dilt drip with improvement. Found to have atrial flutter with 4:1 AV block. Diltiazem was transitioned to coreg 3.125mg  bid, aspirin was changed to eliquis (onc recs hold if plt <50), losartan and spironolactone were started. EF 20-30%, rec referral to EP as outpatient to consider ablation. He did receive 1 u pRBC in hospital for anemia.   H/o CAD - mild MI seen on prior stress test.   Admit date: 05/01/2018 Discharge date: 05/04/2018 TCM hosp f/u phone call - not completed, attempted x2.   Recommendations for Outpatient Follow-up:  1. F/u with PMD within a week  for hospital discharge follow up 2. F/u with oncology ( repeat cbc/bmp on Monday at oncology clinic) 3. F/u with cardiology for heart failure and EP for afib/aflutter  Discharge Diagnoses:  Active Hospital Problems  Diagnosis Date Noted . Atrial flutter ([place]) 05/01/2018 . Anemia 05/01/2018 . Cancer of lower lobe of left lung ([place]) 10/28/2015 . HLD (hyperlipidemia) 01/25/2014 . Smoker  . CAD (coronary artery disease)  . HTN (hypertension) 06/26/2011 . COPD (chronic obstructive pulmonary disease) ([place]) 06/26/2011  Relevant past medical, surgical, family and social history reviewed and updated as indicated. Interim medical history since our last visit reviewed. Allergies and medications reviewed and updated. Discharge medications reconciled with  current medication list. Outpatient Medications Prior to Visit  Medication Sig Dispense Refill  . apixaban (ELIQUIS) 5 MG TABS tablet Take 1 tablet (5 mg total) by mouth 2 (two) times daily. 60 tablet 0  . budesonide-formoterol (SYMBICORT) 160-4.5 MCG/ACT inhaler Inhale 1 puff into the lungs 2 (two) times daily. 3 Inhaler 3  . carvedilol (COREG) 3.125 MG tablet Take 1 tablet (3.125 mg total) by mouth 2 (two) times daily with a meal. 60 tablet 0  . cetirizine (ZYRTEC) 10 MG chewable tablet Chew 10 mg by mouth daily.    Marland Kitchen dexamethasone (DECADRON) 4 MG tablet 4 mg by mouth twice a day the day before, day of and after the chemotherapy every 3 weeks. 40 tablet 0  . diphenhydrAMINE (BENADRYL) 25 MG tablet Take 25 mg by mouth at bedtime as needed for sleep.    . folic acid (FOLVITE) 1 MG tablet TAKE 1 TABLET(1 MG) BY MOUTH DAILY 30 tablet 0  . furosemide (LASIX) 20 MG tablet Take 1 tablet (20 mg total) by mouth daily as needed for fluid or edema. 30 tablet 0  . guaiFENesin (MUCINEX) 600 MG 12 hr tablet Take by mouth 2 (two) times daily.    . hydrOXYzine (ATARAX/VISTARIL) 10 MG tablet Take 1 tablet (10 mg total) by mouth 3 (three) times daily as needed. 30 tablet 0  . lidocaine-prilocaine (EMLA) cream Apply 1 application topically as needed. Squeeze a  small amount of cream to cotton ball and apply to port site 1-2 hours prior to  chemotherapy. Do not rub in the cream and cover  with plastic wrap. 30 g 0  . losartan (COZAAR) 25 MG tablet Take 1 tablet (25 mg total) by mouth daily. 30 tablet 0  . mirtazapine (REMERON) 15 MG tablet Take 1 tablet (15 mg total) by mouth at bedtime. 30 tablet 0  . prochlorperazine (COMPAZINE) 10 MG tablet Take 1 tablet (10 mg total) by mouth every 6 (six) hours as needed for nausea or vomiting. 30 tablet 0  . spironolactone (ALDACTONE) 25 MG tablet Take 0.5 tablets (12.5 mg total) by mouth daily. 30 tablet 0   No facility-administered medications prior to visit.      Per HPI  unless specifically indicated in ROS section below Review of Systems     Objective:    BP 120/64 (BP Location: Right Arm, Patient Position: Sitting, Cuff Size: Normal)   Pulse (!) 51   Temp 98.2 F (36.8 C) (Oral)   Ht 5' 9.5" (1.765 m)   Wt 204 lb 4 oz (92.6 kg)   SpO2 98%   BMI 29.73 kg/m   Wt Readings from Last 3 Encounters:  05/09/18 204 lb 4 oz (92.6 kg)  05/04/18 205 lb 8 oz (93.2 kg)  05/01/18 193 lb 8 oz (87.8 kg)    Physical Exam  Constitutional: He appears well-developed and well-nourished. No distress.  HENT:  Mouth/Throat: Oropharynx is clear and moist. No oropharyngeal exudate.  Cardiovascular: Normal rate and normal heart sounds. An irregular rhythm present.  Extrasystoles are present.  No murmur heard. Coupling present  Pulmonary/Chest: Effort normal. No respiratory distress. He has no wheezes. He has no rales.  Coarse throughout  Skin: Skin is warm and dry. No rash noted.  Nursing note and vitals reviewed.  Results for orders placed or performed in visit on 05/07/18  CMP ([place] [facility] only)  Result Value Ref Range   Sodium 141 135 - 145 mmol/L   Potassium 4.1 3.5 - 5.1 mmol/L   Chloride 104 98 - 111 mmol/L   CO2 29 22 - 32 mmol/L   Glucose, Bld 129 (H) 70 - 99 mg/dL   BUN 14 8 - 23 mg/dL   Creatinine 0.84 0.61 - 1.24 mg/dL   Calcium 9.5 8.9 - 10.3 mg/dL   Total Protein 6.3 (L) 6.5 - 8.1 g/dL   Albumin 3.1 (L) 3.5 - 5.0 g/dL   AST 21 15 - 41 U/L   ALT 28 0 - 44 U/L   Alkaline Phosphatase 101 38 - 126 U/L   Total Bilirubin 0.4 0.3 - 1.2 mg/dL   GFR, Est Non Af Am >60 >60 mL/min   GFR, Est AFR Am >60 >60 mL/min   Anion gap 8 5 - 15  CBC with Differential (Cancer Center Only)  Result Value Ref Range   WBC Count 3.2 (L) 4.0 - 10.5 K/uL   RBC 2.54 (L) 4.22 - 5.81 MIL/uL   Hemoglobin 8.8 (L) 13.0 - 17.0 g/dL   HCT 27.7 (L) 39.0 - 52.0 %   MCV 109.1 (H) 80.0 - 100.0 fL   MCH 34.6 (H) 26.0 - 34.0 pg   MCHC 31.8 30.0 - 36.0 g/dL   RDW 15.3 11.5 -  15.5 %   Platelet Count 74 (L) 150 - 400 K/uL   nRBC 0.0 0.0 - 0.2 %   Neutrophils Relative % 52 %   Neutro Abs 1.6 (L) 1.7 - 7.7 K/uL   Lymphocytes Relative 22 %   Lymphs Abs 0.7 0.7 - 4.0  K/uL   Monocytes Relative 24 %   Monocytes Absolute 0.7 0.1 - 1.0 K/uL   Eosinophils Relative 2 %   Eosinophils Absolute 0.1 0.0 - 0.5 K/uL   Basophils Relative 0 %   Basophils Absolute 0.0 0.0 - 0.1 K/uL   Immature Granulocytes 0 %   Abs Immature Granulocytes 0.01 0.00 - 0.07 K/uL   Lab Results  Component Value Date   CHOL 202 (H) 08/27/2015   HDL 60.00 08/27/2015   LDLCALC 123 (H) 08/27/2015   LDLDIRECT 100.0 02/28/2017   TRIG 91.0 08/27/2015   CHOLHDL 3 08/27/2015    Lab Results  Component Value Date   TSH 0.913 05/01/2018       Assessment & Plan:   Problem List Items Addressed This Visit    Thoracic aorta atherosclerosis ([place])    Only on eliquis at this time. Not currently on statin. Will need updated FLP at next visit.       Smoker    Continued smoker, precontemplative.       Low serum thyroid stimulating hormone (TSH)    Improved on repeat at hospital - will continue to monitor.  H/o low TSH ?immune mediated from [place]      HTN (hypertension) (Chronic)    Chronic, stable on current regimen.       HLD (hyperlipidemia)    Off statin. Will need FLP updated - did not check today.       Cancer of lower lobe of left lung ([facility])   CAD (coronary artery disease)   Atrial flutter ([place]) - Primary    Recent hospitalization for this, records and updated med list reviewed with patient. Now rate controlled. Continue eliquis anticoagulation, as well as coreg rate control, low dose spironolactone and losartan. He will schedule f/u with cardiology and has f/u planned with EP to discuss possible ablation in hopes of discontinuing eliquis.       Anemia    Progressive anemia. Has iFOB pending through onc. He did receive 1u pRBC during hospitalization. H/o 3cm rectal TVA on  colonoscopy 2018, did not return for f/u due to diagnosis of lung cancer at that time.           No orders of the defined types were placed in this encounter.  No orders of the defined types were placed in this encounter.   Follow up plan: Return in about 6 months (around 11/08/2018) for annual exam, prior fasting for blood work.  [name], MD

## 2018-05-09 NOTE — Patient Instructions (Addendum)
Double check on appointment with Dr [name].  Keep appointment with Dr [name] Continue current medicines.

## 2018-05-09 NOTE — Assessment & Plan Note (Addendum)
Progressive anemia. Has iFOB pending through onc. He did receive 1u pRBC during hospitalization. H/o 3cm rectal TVA on colonoscopy 2018, did not return for f/u due to diagnosis of lung cancer at that time.

## 2018-05-09 NOTE — Assessment & Plan Note (Addendum)
Improved on repeat at hospital - will continue to monitor.  H/o low TSH ?immune mediated from [place]

## 2018-05-09 NOTE — Assessment & Plan Note (Signed)
Only on eliquis at this time. Not currently on statin. Will need updated FLP at next visit.

## 2018-05-13 ENCOUNTER — Other Ambulatory Visit: Payer: Self-pay | Admitting: Internal Medicine

## 2018-05-13 ENCOUNTER — Ambulatory Visit (INDEPENDENT_AMBULATORY_CARE_PROVIDER_SITE_OTHER): Payer: BLUE CROSS/BLUE SHIELD | Admitting: Cardiology

## 2018-05-13 ENCOUNTER — Encounter: Payer: Self-pay | Admitting: Cardiology

## 2018-05-13 VITALS — BP 136/63 | HR 77 | Ht 71.0 in | Wt 203.2 lb

## 2018-05-13 DIAGNOSIS — I4892 Unspecified atrial flutter: Secondary | ICD-10-CM | POA: Diagnosis not present

## 2018-05-13 DIAGNOSIS — C3432 Malignant neoplasm of lower lobe, left bronchus or lung: Secondary | ICD-10-CM

## 2018-05-13 DIAGNOSIS — J438 Other emphysema: Secondary | ICD-10-CM

## 2018-05-13 DIAGNOSIS — I2510 Atherosclerotic heart disease of native coronary artery without angina pectoris: Secondary | ICD-10-CM

## 2018-05-13 DIAGNOSIS — D61810 Antineoplastic chemotherapy induced pancytopenia: Secondary | ICD-10-CM

## 2018-05-13 DIAGNOSIS — R9430 Abnormal result of cardiovascular function study, unspecified: Secondary | ICD-10-CM

## 2018-05-13 DIAGNOSIS — Z7901 Long term (current) use of anticoagulants: Secondary | ICD-10-CM

## 2018-05-13 DIAGNOSIS — Z5112 Encounter for antineoplastic immunotherapy: Secondary | ICD-10-CM

## 2018-05-13 DIAGNOSIS — I429 Cardiomyopathy, unspecified: Secondary | ICD-10-CM | POA: Insufficient documentation

## 2018-05-13 DIAGNOSIS — R5382 Chronic fatigue, unspecified: Secondary | ICD-10-CM

## 2018-05-13 DIAGNOSIS — I428 Other cardiomyopathies: Secondary | ICD-10-CM

## 2018-05-13 DIAGNOSIS — Z7189 Other specified counseling: Secondary | ICD-10-CM

## 2018-05-13 DIAGNOSIS — Z23 Encounter for immunization: Secondary | ICD-10-CM

## 2018-05-13 NOTE — Progress Notes (Signed)
05/13/2018 Brian White   1950/12/09  [id number]  Primary Physician [name], MD Primary Cardiologist: Dr [name]  HPI: Brian White is a 67 year old male with a history of prostate cancer and stage IIIb non-small cell lung cancer who is currently undergoing chemotherapy.  He saw his family doctor on 05/01/2018.  He was noted to be tachycardic with a heart rate of 140.  He was sent to the ER at [facility].  The patient was unaware of any tachycardia and had no prior history of any tachycardia.  He has had dyspnea on exertion but he attributed that to his lung issues.  On arrival he was noted to be in atrial flutter with a rate of 150.  He was seen by Dr. [name].  He is felt to be a CHADS VASC= 3.  His TSH was normal.  Echocardiogram showed significant LV dysfunction.  He also has pancytopenia and thrombocytopenia from his chemotherapy.  Ultimately it was decided to place the patient on Eliquis.  He is to be followed up closely as an outpatient.  He also has an appointment for an electrophysiologic evaluation for possible atrial flutter ablation.  He is seen in the office today for follow-up.  He has been tolerating his Eliquis.  He is in sinus rhythm today with PACs.  He has dyspnea but this sounds like it is chronic and not something new.  He does have some swelling in his legs.   Current Outpatient Medications  Medication Sig Dispense Refill  . apixaban (ELIQUIS) 5 MG TABS tablet Take 1 tablet (5 mg total) by mouth 2 (two) times daily. 60 tablet 0  . budesonide-formoterol (SYMBICORT) 160-4.5 MCG/ACT inhaler Inhale 1 puff into the lungs 2 (two) times daily. 3 Inhaler 3  . carvedilol (COREG) 3.125 MG tablet Take 1 tablet (3.125 mg total) by mouth 2 (two) times daily with a meal. 60 tablet 0  . cetirizine (ZYRTEC) 10 MG chewable tablet Chew 10 mg by mouth daily.    Marland Kitchen dexamethasone (DECADRON) 4 MG tablet 4 mg by mouth twice a day the day before, day of and after the chemotherapy every 3 weeks.  40 tablet 0  . diphenhydrAMINE (BENADRYL) 25 MG tablet Take 25 mg by mouth at bedtime as needed for sleep.    . folic acid (FOLVITE) 1 MG tablet TAKE 1 TABLET(1 MG) BY MOUTH DAILY 30 tablet 0  . furosemide (LASIX) 20 MG tablet Take 1 tablet (20 mg total) by mouth daily as needed for fluid or edema. 30 tablet 0  . guaiFENesin (MUCINEX) 600 MG 12 hr tablet Take by mouth 2 (two) times daily.    . hydrOXYzine (ATARAX/VISTARIL) 10 MG tablet Take 1 tablet (10 mg total) by mouth 3 (three) times daily as needed. 30 tablet 0  . lidocaine-prilocaine (EMLA) cream Apply 1 application topically as needed. Squeeze a  small amount of cream to cotton ball and apply to port site 1-2 hours prior to chemotherapy. Do not rub in the cream and cover  with plastic wrap. 30 g 0  . losartan (COZAAR) 25 MG tablet Take 1 tablet (25 mg total) by mouth daily. 30 tablet 0  . mirtazapine (REMERON) 15 MG tablet Take 1 tablet (15 mg total) by mouth at bedtime. 30 tablet 0  . prochlorperazine (COMPAZINE) 10 MG tablet Take 1 tablet (10 mg total) by mouth every 6 (six) hours as needed for nausea or vomiting. 30 tablet 0  . spironolactone (ALDACTONE) 25 MG tablet Take 0.5 tablets (  12.5 mg total) by mouth daily. 30 tablet 0   No current facility-administered medications for this visit.     Allergies  Allergen Reactions  . Onion Anaphylaxis         Past Medical History:  Diagnosis Date  . Adenocarcinoma, lung ([place]) 10/08/2015   2.4cm spiculated LLL nodule suspicious for primary bronchogenic carcinoma by CT 09/2015 - adenocarcinoma by endobronchial biopsy  . Allergy   . Arthritis   . CAD (coronary artery disease)    mild MI; pt unaware - seen on stress test  . COPD (chronic obstructive pulmonary disease) ([place]) 06/26/2011   Mild centrilobular and paraseptal emphysema by CT 09/2015   . ED (erectile dysfunction)   . H/O anaphylactic shock 2013   hives,hypotension,syncope  . H/O seasonal allergies   . Herpes zoster 12/06/2015    . Hypertension   . Polycythemia 01/25/2014  . Prostate cancer [facility]) 2013   s/p radiation therapy March 2013, planned f/u with uro after XRT  . Radiation 09/18/2011 through 11/10/2011   Prostate 7600 cGy 40 sessions, seminal vesicles 5600 cGy 40 sessions    . Radiation 11/10/15-12/22/15   chest 60 Gy  . Shortness of breath dyspnea   . Smoker   . Thoracic aorta atherosclerosis ([place]) 10/08/2015   Mild by CT 09/2015     Social History   Socioeconomic History  . Marital status: Married    Spouse name: Not on file  . Number of children: Y  . Years of education: Not on file  . Highest education level: Not on file  Occupational History  . Occupation: retired  Scientific laboratory technician  . Financial resource strain: Not on file  . Food insecurity:    Worry: Not on file    Inability: Not on file  . Transportation needs:    Medical: Not on file    Non-medical: Not on file  Tobacco Use  . Smoking status: Heavy Tobacco Smoker    Packs/day: 0.67    Years: 54.00    Pack years: 36.18    Types: Cigarettes    Start date: 01/16/1967    Last attempt to quit: 11/06/2015    Years since quitting: 2.5  . Smokeless tobacco: Never Used  . Tobacco comment: Peak rate of 2ppd; quit on Chantix but started again   Substance and Sexual Activity  . Alcohol use: Yes    Alcohol/week: 1.0 standard drinks    Types: 1 Shots of liquor per week    Comment: 2-3 beers or mixed drinks daily    . Drug use: Yes    Types: Marijuana    Comment: marijuana occasional weekly  . Sexual activity: Yes    Birth control/protection: None  Lifestyle  . Physical activity:    Days per week: Not on file    Minutes per session: Not on file  . Stress: Not on file  Relationships  . Social connections:    Talks on phone: Not on file    Gets together: Not on file    Attends religious service: Not on file    Active member of club or organization: Not on file    Attends meetings of clubs or organizations: Not on file     Relationship status: Not on file  . Intimate partner violence:    Fear of current or ex partner: Not on file    Emotionally abused: Not on file    Physically abused: Not on file    Forced sexual activity: Not on file  Other Topics Concern  . Not on file  Social History Narrative   Lives with wife, dogs, chickens and goats   Occupation: retired.  On disability for herinated discs in back   Edu: HS   Activity: works outside [employer], takes care of farm animals   Diet: good water, fruits/vegetables daily      [facility]:   Originally from [place]. 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 No known asbestos exposure. Questionable mold exposure. Has 2 dogs currently. No recent bird exposure. Previously had chickens.      Family History  Problem Relation Age of Onset  . Asthma Father   . COPD Father   . CAD Father        possibly  . Emphysema Father   . Breast cancer Sister   . Atrial fibrillation Sister   . Hypertension Mother   . Diabetes Neg Hx   . Stroke Neg Hx   . Colon cancer Neg Hx   . Colon polyps Neg Hx   . Rectal cancer Neg Hx   . Stomach cancer Neg Hx      Review of Systems: General: negative for chills, fever, night sweats or weight changes.  Cardiovascular: negative for chest pain, orthopnea, palpitations, paroxysmal nocturnal dyspnea  Dermatological: negative for rash Respiratory: negative for cough or wheezing Urologic: negative for hematuria Abdominal: negative for nausea, vomiting, diarrhea, bright red blood per rectum, melena, or hematemesis Neurologic: negative for visual changes, syncope, or dizziness All other systems reviewed and are otherwise negative except as noted above.    Blood pressure 136/63, pulse 77, height 5\' 11"  (1.803 m), weight 203 lb 3.2 oz (92.2 kg).  General appearance: alert, cooperative, appears older than stated age and no distress Neck: no carotid bruit  and no JVD Lungs: clear to auscultation bilaterally Heart: regular rate and rhythm Extremities: 1-2+ edema Skin: pale cool, dry Neurologic: Grossly normal  EKG his EKG shows sinus rhythm with a ventricular rate of 77.  He has an incomplete right bundle branch block.  He has less left axis deviation.  He has Q waves in the inferior leads.  His QTC is 473.  ASSESSMENT AND PLAN:   Atrial flutter New onset, unknown duration with RVR.  He is in normal sinus rhythm today.  Anticoagulated He is tolerating Eliquis, labs done on 05/07/2018 show a hemoglobin of 8.8 which is stable, his platelet count is 74,000 which is up from his platelet count of 62,000.  Cardiomyopathy Etiology is not yet determined.  It is possible this may be from his atrial flutter.  He also has inferior Q waves on his EKG.  He has no history of MI or prior chest pain.  Pancytopenia Related to his chemotherapy.  Stage IIIb lung cancer Treated with chemotherapy.  PLAN I suggested Mr. Pore increase his Lasix to 40 mg a day for 3 days.  He will continue his Eliquis.  He is on low-dose losartan and carvedilol.  His systolic blood pressure by me today in the office was 100, I do not feel he could tolerate Entresto at this time.  We will check an echo in 3 months and have him follow-up with Dr. [name] after this.  He has an appointment coming up with Dr. [name] for EP evaluation.  [name] PA-C 05/13/2018 2:16 PM

## 2018-05-13 NOTE — Patient Instructions (Addendum)
Medication Instructions:  INCREASE YOUR FUROSEMIDE TO 2 TABLETS DAILY FOR 3 DAYS ONLY, THEN GO BACK TO JUST 1 DAILY   If you need a refill on your cardiac medications before your next appointment, please call your pharmacy.   Lab work: NONE  Testing/Procedures: Your physician has requested that you have an echocardiogram. Echocardiography is a painless test that uses sound waves to create images of your heart. It provides your doctor with information about the size and shape of your heart and how well your heart's chambers and valves are working. This procedure takes approximately one hour. There are no restrictions for this procedure. IN 3 MONTHS   Follow-Up: At [facility], you and your health needs are our priority.  As part of our continuing mission to provide you with exceptional heart care, we have created designated Provider Care Teams.  These Care Teams include your primary Cardiologist (physician) and Advanced Practice Providers (APPs -  Physician Assistants and Nurse Practitioners) who all work together to provide you with the care you need, when you need it. You will need a follow up appointment in 3 months. You may see DR [name]  or one of the following Advanced Practice Providers on your designated Care Team:   [name], PA-C Roby Lofts, [place] . Sande Rives, PA-C  KEEP APPOINTMENT AS SCHEDULED WITH DR [name]

## 2018-05-15 ENCOUNTER — Inpatient Hospital Stay: Payer: BLUE CROSS/BLUE SHIELD

## 2018-05-15 ENCOUNTER — Encounter: Payer: Self-pay | Admitting: Internal Medicine

## 2018-05-15 ENCOUNTER — Inpatient Hospital Stay (HOSPITAL_BASED_OUTPATIENT_CLINIC_OR_DEPARTMENT_OTHER): Payer: BLUE CROSS/BLUE SHIELD | Admitting: Internal Medicine

## 2018-05-15 ENCOUNTER — Other Ambulatory Visit: Payer: Self-pay | Admitting: Internal Medicine

## 2018-05-15 VITALS — BP 110/64 | HR 63 | Temp 98.2°F | Resp 18 | Ht 71.0 in | Wt 199.5 lb

## 2018-05-15 DIAGNOSIS — C3432 Malignant neoplasm of lower lobe, left bronchus or lung: Secondary | ICD-10-CM

## 2018-05-15 DIAGNOSIS — Z5112 Encounter for antineoplastic immunotherapy: Secondary | ICD-10-CM | POA: Diagnosis not present

## 2018-05-15 DIAGNOSIS — Z7901 Long term (current) use of anticoagulants: Secondary | ICD-10-CM | POA: Diagnosis not present

## 2018-05-15 DIAGNOSIS — I4891 Unspecified atrial fibrillation: Secondary | ICD-10-CM | POA: Diagnosis not present

## 2018-05-15 DIAGNOSIS — H04209 Unspecified epiphora, unspecified lacrimal gland: Secondary | ICD-10-CM | POA: Diagnosis not present

## 2018-05-15 DIAGNOSIS — Z5111 Encounter for antineoplastic chemotherapy: Secondary | ICD-10-CM

## 2018-05-15 LAB — CBC WITH DIFFERENTIAL (CANCER CENTER ONLY)
Abs Immature Granulocytes: 0.02 10*3/uL (ref 0.00–0.07)
Basophils Absolute: 0 10*3/uL (ref 0.0–0.1)
Basophils Relative: 0 %
Eosinophils Absolute: 0 10*3/uL (ref 0.0–0.5)
Eosinophils Relative: 0 %
HCT: 28.2 % — ABNORMAL LOW (ref 39.0–52.0)
Hemoglobin: 9 g/dL — ABNORMAL LOW (ref 13.0–17.0)
Immature Granulocytes: 0 %
Lymphocytes Relative: 11 %
Lymphs Abs: 0.6 10*3/uL — ABNORMAL LOW (ref 0.7–4.0)
MCH: 34.5 pg — ABNORMAL HIGH (ref 26.0–34.0)
MCHC: 31.9 g/dL (ref 30.0–36.0)
MCV: 108 fL — ABNORMAL HIGH (ref 80.0–100.0)
Monocytes Absolute: 0.5 10*3/uL (ref 0.1–1.0)
Monocytes Relative: 9 %
Neutro Abs: 4.5 10*3/uL (ref 1.7–7.7)
Neutrophils Relative %: 80 %
Platelet Count: 255 10*3/uL (ref 150–400)
RBC: 2.61 MIL/uL — ABNORMAL LOW (ref 4.22–5.81)
RDW: 15.5 % (ref 11.5–15.5)
WBC Count: 5.6 10*3/uL (ref 4.0–10.5)
nRBC: 0 % (ref 0.0–0.2)

## 2018-05-15 LAB — CMP (CANCER CENTER ONLY)
ALT: 20 U/L (ref 0–44)
AST: 21 U/L (ref 15–41)
Albumin: 3.3 g/dL — ABNORMAL LOW (ref 3.5–5.0)
Alkaline Phosphatase: 92 U/L (ref 38–126)
Anion gap: 9 (ref 5–15)
BUN: 19 mg/dL (ref 8–23)
CO2: 27 mmol/L (ref 22–32)
Calcium: 9.6 mg/dL (ref 8.9–10.3)
Chloride: 104 mmol/L (ref 98–111)
Creatinine: 0.84 mg/dL (ref 0.61–1.24)
GFR, Est AFR Am: >60 mL/min (ref >60)
GFR, Estimated: >60 mL/min (ref >60)
Glucose, Bld: 124 mg/dL — ABNORMAL HIGH (ref 70–99)
Potassium: 4.4 mmol/L (ref 3.5–5.1)
Sodium: 140 mmol/L (ref 135–145)
Total Bilirubin: 0.4 mg/dL (ref 0.3–1.2)
Total Protein: 6.7 g/dL (ref 6.5–8.1)

## 2018-05-15 MED ORDER — PROCHLORPERAZINE MALEATE 10 MG PO TABS
ORAL_TABLET | ORAL | Status: AC
  Filled 2018-05-15: qty 1

## 2018-05-15 MED ORDER — SODIUM CHLORIDE 0.9 % IV SOLN
Freq: Once | INTRAVENOUS | Status: AC
  Administered 2018-05-15: 12:00:00 via INTRAVENOUS
  Filled 2018-05-15: qty 250

## 2018-05-15 MED ORDER — SODIUM CHLORIDE 0.9 % IV SOLN
200.0000 mg | Freq: Once | INTRAVENOUS | Status: AC
  Administered 2018-05-15: 200 mg via INTRAVENOUS
  Filled 2018-05-15: qty 8

## 2018-05-15 MED ORDER — SODIUM CHLORIDE 0.9% FLUSH
10.0000 mL | INTRAVENOUS | Status: DC | PRN
  Administered 2018-05-15: 10 mL
  Filled 2018-05-15: qty 10

## 2018-05-15 MED ORDER — HEPARIN SOD (PORK) LOCK FLUSH 100 UNIT/ML IV SOLN
500.0000 [IU] | Freq: Once | INTRAVENOUS | Status: AC | PRN
  Administered 2018-05-15: 500 [IU]
  Filled 2018-05-15: qty 5

## 2018-05-15 MED ORDER — PROCHLORPERAZINE MALEATE 10 MG PO TABS
10.0000 mg | ORAL_TABLET | Freq: Once | ORAL | Status: AC
  Administered 2018-05-15: 10 mg via ORAL

## 2018-05-15 MED ORDER — SODIUM CHLORIDE 0.9 % IV SOLN
500.0000 mg/m2 | Freq: Once | INTRAVENOUS | Status: AC
  Administered 2018-05-15: 1100 mg via INTRAVENOUS
  Filled 2018-05-15: qty 40

## 2018-05-15 MED ORDER — SODIUM CHLORIDE 0.9% FLUSH
10.0000 mL | INTRAVENOUS | Status: DC | PRN
  Administered 2018-05-15: 10 mL via INTRAVENOUS
  Filled 2018-05-15: qty 10

## 2018-05-15 NOTE — Patient Instructions (Signed)
Steps to Quit Smoking Smoking tobacco can be bad for your health. It can also affect almost every organ in your body. Smoking puts you and people around you at risk for many serious long-lasting (chronic) diseases. Quitting smoking is hard, but it is one of the best things that you can do for your health. It is never too late to quit. What are the benefits of quitting smoking? When you quit smoking, you lower your risk for getting serious diseases and conditions. They can include:  Lung cancer or lung disease.  Heart disease.  Stroke.  Heart attack.  Not being able to have children (infertility).  Weak bones (osteoporosis) and broken bones (fractures).  If you have coughing, wheezing, and shortness of breath, those symptoms may get better when you quit. You may also get sick less often. If you are pregnant, quitting smoking can help to lower your chances of having a baby of low birth weight. What can I do to help me quit smoking? Talk with your doctor about what can help you quit smoking. Some things you can do (strategies) include:  Quitting smoking totally, instead of slowly cutting back how much you smoke over a period of time.  Going to in-person counseling. You are more likely to quit if you go to many counseling sessions.  Using resources and support systems, such as: ? Online chats with a counselor. ? Phone quitlines. ? Printed self-help materials. ? Support groups or group counseling. ? Text messaging programs. ? Mobile phone apps or applications.  Taking medicines. Some of these medicines may have nicotine in them. If you are pregnant or breastfeeding, do not take any medicines to quit smoking unless your doctor says it is okay. Talk with your doctor about counseling or other things that can help you.  Talk with your doctor about using more than one strategy at the same time, such as taking medicines while you are also going to in-person counseling. This can help make  quitting easier. What things can I do to make it easier to quit? Quitting smoking might feel very hard at first, but there is a lot that you can do to make it easier. Take these steps:  Talk to your family and friends. Ask them to support and encourage you.  Call phone quitlines, reach out to support groups, or work with a counselor.  Ask people who smoke to not smoke around you.  Avoid places that make you want (trigger) to smoke, such as: ? Bars. ? Parties. ? Smoke-break areas at work.  Spend time with people who do not smoke.  Lower the stress in your life. Stress can make you want to smoke. Try these things to help your stress: ? Getting regular exercise. ? Deep-breathing exercises. ? Yoga. ? Meditating. ? Doing a body scan. To do this, close your eyes, focus on one area of your body at a time from head to toe, and notice which parts of your body are tense. Try to relax the muscles in those areas.  Download or buy apps on your mobile phone or tablet that can help you stick to your quit plan. There are many free apps, such as QuitGuide from the CDC (Centers for Disease Control and Prevention). You can find more support from smokefree.gov and other websites.  This information is not intended to replace advice given to you by your health care provider. Make sure you discuss any questions you have with your health care provider. Document Released: 04/29/2009 Document   Revised: 02/29/2016 Document Reviewed: 11/17/2014 Elsevier Interactive Patient Education  2018 [employer]  

## 2018-05-15 NOTE — Progress Notes (Signed)
[place] [phone]   [phone]  OFFICE PROGRESS NOTE  [name], MD [place] [address]  DIAGNOSIS: recurrent non-small cell lung cancer initially diagnosed as Stage IIIB (T1b, N3, M0) non-small cell lung cancer favoring adenocarcinoma presented with left lower lobe pulmonary nodule in addition to left hilar and bilateral mediastinal lymphadenopathy in addition to right supraclavicular lymph nodes diagnosed in April 2017.  PRIOR THERAPY: 1) Concurrent chemoradiation with weekly carboplatin for AUC of 2 and paclitaxel 45 mg/M2 started on 11/15/2015. He is status post 6 cycles. Last dose was getting 12/20/2015 with partial response. 2) Consolidation chemotherapy with carboplatin for AUC of 5 and paclitaxel 175 MG/M2 every 3 weeks with Neulasta support. Status post 3 cycles. Last dose was given 04/03/2016.  CURRENT THERAPY: systemic chemotherapy with carboplatin for AUC of 5, Alimta 500 MG/M2 and Ketruda 200 MG IV every 3 weeks. First dose 05/16/2017.  Status post 17 cycle.  Starting from cycle #5 the patient is treated with maintenance Alimta and Keytruda only.  INTERVAL HISTORY: Brian White 67 y.o. male returns to the clinic today for follow-up visit accompanied by his wife.  The patient is feeling much better today.  He was recently admitted to [facility] with atrial fibrillation and underwent treatment with carvedilol.  He converted to a sinus rhythm.  He was also started on Eliquis for the atrial fibrillation.  The patient denied having any current chest pain, shortness of breath except with exertion with no cough or hemoptysis.  He has no fever or chills.  He denied having any recent weight loss or night sweats.  He is here for evaluation before starting cycle #18.   MEDICAL HISTORY: Past Medical History:  Diagnosis Date  . Adenocarcinoma, lung ([place]) 10/08/2015   2.4cm spiculated LLL nodule suspicious  for primary bronchogenic carcinoma by CT 09/2015 - adenocarcinoma by endobronchial biopsy  . Allergy   . Arthritis   . CAD (coronary artery disease)    mild MI; pt unaware - seen on stress test  . COPD (chronic obstructive pulmonary disease) ([place]) 06/26/2011   Mild centrilobular and paraseptal emphysema by CT 09/2015   . ED (erectile dysfunction)   . H/O anaphylactic shock 2013   hives,hypotension,syncope  . H/O seasonal allergies   . Herpes zoster 12/06/2015  . Hypertension   . Polycythemia 01/25/2014  . Prostate cancer [facility]) 2013   s/p radiation therapy March 2013, planned f/u with uro after XRT  . Radiation 09/18/2011 through 11/10/2011   Prostate 7600 cGy 40 sessions, seminal vesicles 5600 cGy 40 sessions    . Radiation 11/10/15-12/22/15   chest 60 Gy  . Shortness of breath dyspnea   . Smoker   . Thoracic aorta atherosclerosis ([place]) 10/08/2015   Mild by CT 09/2015     ALLERGIES:  is allergic to onion.  MEDICATIONS:  Current Outpatient Medications  Medication Sig Dispense Refill  . apixaban (ELIQUIS) 5 MG TABS tablet Take 1 tablet (5 mg total) by mouth 2 (two) times daily. 60 tablet 0  . budesonide-formoterol (SYMBICORT) 160-4.5 MCG/ACT inhaler Inhale 1 puff into the lungs 2 (two) times daily. 3 Inhaler 3  . carvedilol (COREG) 3.125 MG tablet Take 1 tablet (3.125 mg total) by mouth 2 (two) times daily with a meal. 60 tablet 0  . cetirizine (ZYRTEC) 10 MG chewable tablet Chew 10 mg by mouth daily.    Marland Kitchen dexamethasone (DECADRON) 4 MG tablet 4 mg by  mouth twice a day the day before, day of and after the chemotherapy every 3 weeks. 40 tablet 0  . diphenhydrAMINE (BENADRYL) 25 MG tablet Take 25 mg by mouth at bedtime as needed for sleep.    . folic acid (FOLVITE) 1 MG tablet TAKE 1 TABLET(1 MG) BY MOUTH DAILY 30 tablet 0  . folic acid (FOLVITE) 1 MG tablet TAKE 1 TABLET(1 MG) BY MOUTH DAILY 30 tablet 0  . furosemide (LASIX) 20 MG tablet Take 1 tablet (20 mg total) by  mouth daily as needed for fluid or edema. 30 tablet 0  . guaiFENesin (MUCINEX) 600 MG 12 hr tablet Take by mouth 2 (two) times daily.    . hydrOXYzine (ATARAX/VISTARIL) 10 MG tablet Take 1 tablet (10 mg total) by mouth 3 (three) times daily as needed. 30 tablet 0  . lidocaine-prilocaine (EMLA) cream Apply 1 application topically as needed. Squeeze a  small amount of cream to cotton ball and apply to port site 1-2 hours prior to chemotherapy. Do not rub in the cream and cover  with plastic wrap. 30 g 0  . losartan (COZAAR) 25 MG tablet Take 1 tablet (25 mg total) by mouth daily. 30 tablet 0  . mirtazapine (REMERON) 15 MG tablet Take 1 tablet (15 mg total) by mouth at bedtime. 30 tablet 0  . prochlorperazine (COMPAZINE) 10 MG tablet Take 1 tablet (10 mg total) by mouth every 6 (six) hours as needed for nausea or vomiting. 30 tablet 0  . spironolactone (ALDACTONE) 25 MG tablet Take 0.5 tablets (12.5 mg total) by mouth daily. 30 tablet 0   No current facility-administered medications for this visit.     SURGICAL HISTORY:  Past Surgical History:  Procedure Laterality Date  . COLONOSCOPY W/ BIOPSIES AND POLYPECTOMY  10/11/15   mult TAs, 3cm rectal polypoid lesion; rec further treatment, diverticulosis ([place])  . ENDOBRONCHIAL ULTRASOUND Bilateral 10/18/2015   Procedure: ENDOBRONCHIAL ULTRASOUND;  Surgeon: Javier Glazier, MD;  Location: [facility] ENDOSCOPY;  Service: Cardiopulmonary;  Laterality: Bilateral;  . HERNIA REPAIR Right 2005   with mesh  . INSERTION PROSTATE RADIATION SEED  08/2011   prostate cancer, [place]  . IR FLUORO GUIDE PORT INSERTION RIGHT  07/26/2017  . IR US GUIDE VASC ACCESS RIGHT  07/26/2017    REVIEW OF SYSTEMS:  A comprehensive review of systems was negative except for: Respiratory: positive for dyspnea on exertion   PHYSICAL EXAMINATION: General appearance: alert, cooperative, fatigued and no distress Head: Normocephalic, without obvious abnormality, atraumatic Neck: no  adenopathy, no JVD, supple, symmetrical, trachea midline and thyroid not enlarged, symmetric, no tenderness/mass/nodules Lymph nodes: Cervical, supraclavicular, and axillary nodes normal. Resp: clear to auscultation bilaterally Back: symmetric, no curvature. ROM normal. No CVA tenderness. Cardio: regular rate and rhythm, S1, S2 normal, no murmur, click, rub or gallop GI: soft, non-tender; bowel sounds normal; no masses,  no organomegaly Extremities: extremities normal, atraumatic, no cyanosis or edema  ECOG PERFORMANCE STATUS: 1 - Symptomatic but completely ambulatory  Blood pressure 110/64, pulse 63, temperature 98.2 F (36.8 C), temperature source Oral, resp. rate 18, height 5\' 11"  (1.803 m), weight 199 lb 8 oz (90.5 kg), SpO2 100 %.  LABORATORY DATA: Lab Results  Component Value Date   WBC 5.6 05/15/2018   HGB 9.0 (L) 05/15/2018   HCT 28.2 (L) 05/15/2018   MCV 108.0 (H) 05/15/2018   PLT 255 05/15/2018      Chemistry      Component Value Date/Time   NA 141 05/07/2018  1016   NA 138 07/18/2017 1000   K 4.1 05/07/2018 1016   K 4.0 07/18/2017 1000   CL 104 05/07/2018 1016   CO2 29 05/07/2018 1016   CO2 25 07/18/2017 1000   BUN 14 05/07/2018 1016   BUN 14.9 07/18/2017 1000   CREATININE 0.84 05/07/2018 1016   CREATININE 0.8 07/18/2017 1000      Component Value Date/Time   CALCIUM 9.5 05/07/2018 1016   CALCIUM 9.7 07/18/2017 1000   ALKPHOS 101 05/07/2018 1016   ALKPHOS 93 07/18/2017 1000   AST 21 05/07/2018 1016   AST 22 07/18/2017 1000   ALT 28 05/07/2018 1016   ALT 24 07/18/2017 1000   BILITOT 0.4 05/07/2018 1016   BILITOT 0.54 07/18/2017 1000       RADIOGRAPHIC STUDIES: Dg Chest 2 View  Result Date: 05/01/2018 CLINICAL DATA:  Palpitations, chest pain. EXAM: CHEST - 2 VIEW COMPARISON:  Radiograph of October 18, 2015. FINDINGS: Stable cardiomediastinal silhouette. No pneumothorax or pleural effusion is noted. Interval placement of right internal jugular Port-A-Cath  with distal tip in expected position of superior vena cava. Both lungs are clear. The visualized skeletal structures are unremarkable. IMPRESSION: Interval placement of right internal jugular Port-A-Cath. No acute cardiopulmonary abnormality seen. Electronically Signed   By: Marijo Conception, M.D.   On: 05/01/2018 12:55   Ct Chest W Contrast  Addendum Date: 04/24/2018   ADDENDUM REPORT: 04/24/2018 14:19 ADDENDUM: The "Mediastinum/Nodes" section of the chest CT report is inaccurate. The AP window lymph node measured 2.1 x 2.5 cm on the exam from 02/19/2018. This same lymph node now measures 2.4 x 2.9 cm on the exam from 04/22/2018. As reported in the "Impression" section of this report, this AP window lymph node has progressed in the 2 month interval since 02/19/2018. Electronically Signed   By: Misty Stanley M.D.   On: 04/24/2018 14:19   Result Date: 04/24/2018 CLINICAL DATA:  Left-sided lung cancer. EXAM: CT CHEST, ABDOMEN, AND PELVIS WITH CONTRAST TECHNIQUE: Multidetector CT imaging of the chest, abdomen and pelvis was performed following the standard protocol during bolus administration of intravenous contrast. CONTRAST:  137mL OMNIPAQUE IOHEXOL 300 MG/ML  SOLN COMPARISON:  Chest CT 02/19/2018.  PET-CT 03/08/2017 FINDINGS: CT CHEST FINDINGS Cardiovascular: The heart size is normal. No substantial pericardial effusion. Coronary artery calcification is evident. Endocardial calcification in the left ventricle suggest prior infarct. Atherosclerotic calcification is noted in the wall of the thoracic aorta. Right Port-A-Cath tip is positioned in the proximal SVC. Mediastinum/Nodes: 2.4 x 2.9 cm AP window lymph node measured on the previous study measures 2.1 x 2.5 cm today. No other hilar mediastinal lymphadenopathy. There is no hilar lymphadenopathy. Soft tissue fullness in the inferior left hilum is stable. The esophagus has normal imaging features. There is no axillary lymphadenopathy. Lungs/Pleura: The central  tracheobronchial airways are patent. Architectural distortion and scarring in the infrahilar left lower lobe is similar to prior with no change in the 1.1 x 1.9 cm nodular component seen on image 99/series 4. No pleural effusion. No focal consolidation. Musculoskeletal: No worrisome lytic or sclerotic osseous abnormality. CT ABDOMEN PELVIS FINDINGS Hepatobiliary: No focal abnormality within the liver parenchyma. Gallbladder nondistended which may account for the appearance of wall thickening. This is similar to prior study of 02/19/2018. No evidence for gallstones. No intrahepatic or extrahepatic biliary dilation. Pancreas: No focal mass lesion. No dilatation of the main duct. No intraparenchymal cyst. No peripancreatic edema. Spleen: No splenomegaly. No focal mass lesion. Adrenals/Urinary Tract:  No adrenal nodule or mass. Right kidney unremarkable. 6 mm low-density cortical lesion left kidney is too small to characterize but is likely benign. No evidence for hydroureter. Nodular soft tissue at the bladder base is stable since prior PET-CT may be related to prostatic hypertrophy. Stomach/Bowel: Stomach is nondistended. No gastric wall thickening. No evidence of outlet obstruction. Duodenum is normally positioned as is the ligament of Treitz. No small bowel wall thickening. No small bowel dilatation. The terminal ileum is normal. The appendix is normal. No gross colonic mass. No colonic wall thickening. No substantial diverticular change. Vascular/Lymphatic: There is abdominal aortic atherosclerosis without aneurysm. 14 mm short axis gastrohepatic ligament lymph node is stable since 02/19/2018. Other borderline lymphadenopathy in the upper abdomen is similar. 14 mm para-aortic lymph node (77/2) has increased from 8 mm short axis on 03/08/2017. 16 mm short axis aortocaval lymph node (72/2) is new since 03/08/2017. Multiple other upper normal retroperitoneal lymph nodes are suspicious. No pelvic sidewall  lymphadenopathy. Reproductive: Prostate gland is enlarged.  Fiducial markers evident. Other: No intraperitoneal free fluid. Musculoskeletal: Bilateral groin hernias contain only fat. No worrisome lytic or sclerotic osseous abnormality. IMPRESSION: 1. Interval progression of AP window disease now measuring up to 2.9 cm. 2. Stable gastrohepatic ligament lymphadenopathy since 02/19/2018, but new since 03/08/2017. Retroperitoneal lymphadenopathy was not included on the chest CT of 02/19/2018 but is new since PET-CT of 03/08/2017. 3. Stable infrahilar left lung opacity, likely treatment related. 4. Soft tissue nodularity associated with the bladder base likely related to prostatic enlargement but urothelial lesion cannot be completely excluded by CT. Electronically Signed: By: Misty Stanley M.D. On: 04/23/2018 09:34   Ct Abdomen Pelvis W Contrast  Addendum Date: 04/24/2018   ADDENDUM REPORT: 04/24/2018 14:19 ADDENDUM: The "Mediastinum/Nodes" section of the chest CT report is inaccurate. The AP window lymph node measured 2.1 x 2.5 cm on the exam from 02/19/2018. This same lymph node now measures 2.4 x 2.9 cm on the exam from 04/22/2018. As reported in the "Impression" section of this report, this AP window lymph node has progressed in the 2 month interval since 02/19/2018. Electronically Signed   By: Misty Stanley M.D.   On: 04/24/2018 14:19   Result Date: 04/24/2018 CLINICAL DATA:  Left-sided lung cancer. EXAM: CT CHEST, ABDOMEN, AND PELVIS WITH CONTRAST TECHNIQUE: Multidetector CT imaging of the chest, abdomen and pelvis was performed following the standard protocol during bolus administration of intravenous contrast. CONTRAST:  113mL OMNIPAQUE IOHEXOL 300 MG/ML  SOLN COMPARISON:  Chest CT 02/19/2018.  PET-CT 03/08/2017 FINDINGS: CT CHEST FINDINGS Cardiovascular: The heart size is normal. No substantial pericardial effusion. Coronary artery calcification is evident. Endocardial calcification in the left ventricle  suggest prior infarct. Atherosclerotic calcification is noted in the wall of the thoracic aorta. Right Port-A-Cath tip is positioned in the proximal SVC. Mediastinum/Nodes: 2.4 x 2.9 cm AP window lymph node measured on the previous study measures 2.1 x 2.5 cm today. No other hilar mediastinal lymphadenopathy. There is no hilar lymphadenopathy. Soft tissue fullness in the inferior left hilum is stable. The esophagus has normal imaging features. There is no axillary lymphadenopathy. Lungs/Pleura: The central tracheobronchial airways are patent. Architectural distortion and scarring in the infrahilar left lower lobe is similar to prior with no change in the 1.1 x 1.9 cm nodular component seen on image 99/series 4. No pleural effusion. No focal consolidation. Musculoskeletal: No worrisome lytic or sclerotic osseous abnormality. CT ABDOMEN PELVIS FINDINGS Hepatobiliary: No focal abnormality within the liver parenchyma. Gallbladder nondistended  which may account for the appearance of wall thickening. This is similar to prior study of 02/19/2018. No evidence for gallstones. No intrahepatic or extrahepatic biliary dilation. Pancreas: No focal mass lesion. No dilatation of the main duct. No intraparenchymal cyst. No peripancreatic edema. Spleen: No splenomegaly. No focal mass lesion. Adrenals/Urinary Tract: No adrenal nodule or mass. Right kidney unremarkable. 6 mm low-density cortical lesion left kidney is too small to characterize but is likely benign. No evidence for hydroureter. Nodular soft tissue at the bladder base is stable since prior PET-CT may be related to prostatic hypertrophy. Stomach/Bowel: Stomach is nondistended. No gastric wall thickening. No evidence of outlet obstruction. Duodenum is normally positioned as is the ligament of Treitz. No small bowel wall thickening. No small bowel dilatation. The terminal ileum is normal. The appendix is normal. No gross colonic mass. No colonic wall thickening. No  substantial diverticular change. Vascular/Lymphatic: There is abdominal aortic atherosclerosis without aneurysm. 14 mm short axis gastrohepatic ligament lymph node is stable since 02/19/2018. Other borderline lymphadenopathy in the upper abdomen is similar. 14 mm para-aortic lymph node (77/2) has increased from 8 mm short axis on 03/08/2017. 16 mm short axis aortocaval lymph node (72/2) is new since 03/08/2017. Multiple other upper normal retroperitoneal lymph nodes are suspicious. No pelvic sidewall lymphadenopathy. Reproductive: Prostate gland is enlarged.  Fiducial markers evident. Other: No intraperitoneal free fluid. Musculoskeletal: Bilateral groin hernias contain only fat. No worrisome lytic or sclerotic osseous abnormality. IMPRESSION: 1. Interval progression of AP window disease now measuring up to 2.9 cm. 2. Stable gastrohepatic ligament lymphadenopathy since 02/19/2018, but new since 03/08/2017. Retroperitoneal lymphadenopathy was not included on the chest CT of 02/19/2018 but is new since PET-CT of 03/08/2017. 3. Stable infrahilar left lung opacity, likely treatment related. 4. Soft tissue nodularity associated with the bladder base likely related to prostatic enlargement but urothelial lesion cannot be completely excluded by CT. Electronically Signed: By: Misty Stanley M.D. On: 04/23/2018 09:34    ASSESSMENT AND PLAN:  This is a very pleasant 68 years old white male with recurrent non-small cell lung cancer initially diagnosed as stage IIIB non-small cell lung cancer favoring adenocarcinoma status post concurrent chemoradiation followed by consolidation chemotherapy. His last treatment was in September 2017. Restaging scan showed evidence for disease progression and the patient was started on systemic chemotherapy with carboplatin, Alimta and Keytruda for 4 cycles.  His repeat imaging studies showed improvement of his disease.  He is currently undergoing maintenance treatment with Alimta and  Keytruda status post 17 cycles. The patient is tolerating his treatment well. We will proceed with cycle #18 today as scheduled. He will come back for follow-up visit in 3 weeks for evaluation before the next cycle of his treatment. He was advised to call immediately if he has any concerning symptoms in the interval. For the increased lacrimation from treatment with Alimta, he will use over-the-counter Systane eyedrop. The patient voices understanding of current disease status and treatment options and is in agreement with the current care plan. All questions were answered. The patient knows to call the clinic with any problems, questions or concerns. We can certainly see the patient much sooner if necessary.  Disclaimer: This note was dictated with voice recognition software. Similar sounding words can inadvertently be transcribed and may not be corrected upon review.

## 2018-05-15 NOTE — Patient Instructions (Signed)
[place] Discharge Instructions for Patients Receiving Chemotherapy  Today you received the following chemotherapy agents: Keytruda, Alimta.  To help prevent nausea and vomiting after your treatment, we encourage you to take your nausea medication as prescribed.   If you develop nausea and vomiting that is not controlled by your nausea medication, call the clinic.   BELOW ARE SYMPTOMS THAT SHOULD BE REPORTED IMMEDIATELY:  *FEVER GREATER THAN 100.5 F  *CHILLS WITH OR WITHOUT FEVER  NAUSEA AND VOMITING THAT IS NOT CONTROLLED WITH YOUR NAUSEA MEDICATION  *UNUSUAL SHORTNESS OF BREATH  *UNUSUAL BRUISING OR BLEEDING  TENDERNESS IN MOUTH AND THROAT WITH OR WITHOUT PRESENCE OF ULCERS  *URINARY PROBLEMS  *BOWEL PROBLEMS  UNUSUAL RASH Items with * indicate a potential emergency and should be followed up as soon as possible.  Feel free to call the clinic should you have any questions or concerns. The clinic phone number is [phone].  Please show the [place] at check-in to the Emergency Department and triage nurse.

## 2018-05-16 ENCOUNTER — Telehealth: Payer: Self-pay | Admitting: Internal Medicine

## 2018-05-16 NOTE — Telephone Encounter (Signed)
Scheduled appt per 10/30 los - patient to get an updated schedule next visit.

## 2018-05-21 ENCOUNTER — Encounter: Payer: Self-pay | Admitting: Cardiology

## 2018-05-21 ENCOUNTER — Ambulatory Visit (INDEPENDENT_AMBULATORY_CARE_PROVIDER_SITE_OTHER): Payer: BLUE CROSS/BLUE SHIELD | Admitting: Cardiology

## 2018-05-21 VITALS — BP 106/68 | HR 36 | Ht 71.0 in | Wt 193.0 lb

## 2018-05-21 DIAGNOSIS — I483 Typical atrial flutter: Secondary | ICD-10-CM | POA: Diagnosis not present

## 2018-05-21 DIAGNOSIS — I5022 Chronic systolic (congestive) heart failure: Secondary | ICD-10-CM

## 2018-05-21 NOTE — Progress Notes (Signed)
Electrophysiology Office Note   Date:  05/21/2018   ID:  Brian White, DOB 1950-10-16, MRN [id number]  PCP:  [name], MD  Cardiologist:  [name] Primary Electrophysiologist:  Brix Brearley Meredith Leeds, MD    No chief complaint on file.    History of Present Illness: Brian White is a 67 y.o. male who is being seen today for the evaluation of atrial flutter at the request of [name]. Presenting today for electrophysiology evaluation.  He has a history of lung adenocarcinoma, coronary artery disease, COPD, hypertension deep.  He is currently undergoing chemotherapy for stage IIIb lung cancer.  He saw his family doctor 05/01/2018 was noted to be tachycardic with heart rates into the 140s.  Blood he was found to be in atrial flutter.  Echocardiogram showed significant LV dysfunction.  He was started on Eliquis.  In clinic follow-up, he was noted to be back in sinus rhythm.  Today, he denies symptoms of palpitations, chest pain, shortness of breath, orthopnea, PND, lower extremity edema, claudication, dizziness, presyncope, syncope, bleeding, or neurologic sequela. The patient is tolerating medications without difficulties.    Past Medical History:  Diagnosis Date  . Adenocarcinoma, lung ([place]) 10/08/2015   2.4cm spiculated LLL nodule suspicious for primary bronchogenic carcinoma by CT 09/2015 - adenocarcinoma by endobronchial biopsy  . Allergy   . Arthritis   . CAD (coronary artery disease)    mild MI; pt unaware - seen on stress test  . COPD (chronic obstructive pulmonary disease) ([place]) 06/26/2011   Mild centrilobular and paraseptal emphysema by CT 09/2015   . ED (erectile dysfunction)   . H/O anaphylactic shock 2013   hives,hypotension,syncope  . H/O seasonal allergies   . Herpes zoster 12/06/2015  . Hypertension   . Polycythemia 01/25/2014  . Prostate cancer [facility] 2013   s/p radiation therapy March 2013, planned f/u with uro after XRT  . Radiation 09/18/2011 through  11/10/2011   Prostate 7600 cGy 40 sessions, seminal vesicles 5600 cGy 40 sessions    . Radiation 11/10/15-12/22/15   chest 60 Gy  . Shortness of breath dyspnea   . Smoker   . Thoracic aorta atherosclerosis ([place]) 10/08/2015   Mild by CT 09/2015    Past Surgical History:  Procedure Laterality Date  . COLONOSCOPY W/ BIOPSIES AND POLYPECTOMY  10/11/15   mult TAs, 3cm rectal polypoid lesion; rec further treatment, diverticulosis ([place])  . ENDOBRONCHIAL ULTRASOUND Bilateral 10/18/2015   Procedure: ENDOBRONCHIAL ULTRASOUND;  Surgeon: Javier Glazier, MD;  Location: [facility] END[facility]COPY;  Service: Cardiopulmonary;  Laterality: Bilateral;  . HERNIA REPAIR Right 2005   with mesh  . INSERTION PROSTATE RADIATION SEED  08/2011   prostate cancer, [place]  . IR FLUORO GUIDE PORT INSERTION RIGHT  07/26/2017  . IR US GUIDE VASC ACCESS RIGHT  07/26/2017     Current Outpatient Medications  Medication Sig Dispense Refill  . apixaban (ELIQUIS) 5 MG TABS tablet Take 1 tablet (5 mg total) by mouth 2 (two) times daily. 60 tablet 0  . budesonide-formoterol (SYMBICORT) 160-4.5 MCG/ACT inhaler Inhale 1 puff into the lungs 2 (two) times daily. 3 Inhaler 3  . carvedilol (COREG) 3.125 MG tablet Take 1 tablet (3.125 mg total) by mouth 2 (two) times daily with a meal. 60 tablet 0  . cetirizine (ZYRTEC) 10 MG chewable tablet Chew 10 mg by mouth daily.    Marland Kitchen dexamethasone (DECADRON) 4 MG tablet 4 mg by mouth twice a day the day before, day of and after the chemotherapy every  3 weeks. 40 tablet 0  . folic acid (FOLVITE) 1 MG tablet TAKE 1 TABLET(1 MG) BY MOUTH DAILY 30 tablet 0  . furosemide (LASIX) 20 MG tablet Take 1 tablet (20 mg total) by mouth daily as needed for fluid or edema. 30 tablet 0  . guaiFENesin (MUCINEX) 600 MG 12 hr tablet Take by mouth 2 (two) times daily.    . hydrOXYzine (ATARAX/VISTARIL) 10 MG tablet Take 1 tablet (10 mg total) by mouth 3 (three) times daily as needed. 30 tablet 0  .  lidocaine-prilocaine (EMLA) cream Apply 1 application topically as needed. Squeeze a  small amount of cream to cotton ball and apply to port site 1-2 hours prior to chemotherapy. Do not rub in the cream and cover  with plastic wrap. 30 g 0  . losartan (COZAAR) 25 MG tablet Take 1 tablet (25 mg total) by mouth daily. 30 tablet 0  . mirtazapine (REMERON) 15 MG tablet Take 1 tablet (15 mg total) by mouth at bedtime. 30 tablet 0  . prochlorperazine (COMPAZINE) 10 MG tablet Take 1 tablet (10 mg total) by mouth every 6 (six) hours as needed for nausea or vomiting. (Patient not taking: Reported on 05/15/2018) 30 tablet 0  . spironolactone (ALDACTONE) 25 MG tablet Take 0.5 tablets (12.5 mg total) by mouth daily. 30 tablet 0   No current facility-administered medications for this visit.     Allergies:   Onion   Social History:  The patient  reports that he has been smoking cigarettes. He started smoking about 51 years ago. He has a 36.18 pack-year smoking history. He has never used smokeless tobacco. He reports that he drinks about 1.0 standard drinks of alcohol per week. He reports that he has current or past drug history. Drug: Marijuana.   Family History:  The patient's family history includes Asthma in his father; Atrial fibrillation in his sister; Breast cancer in his sister; CAD in his father; COPD in his father; Emphysema in his father; Hypertension in his mother.    ROS:  Please see the history of present illness.   Otherwise, review of systems is positive for fatigue, palpitations, visual changes, cough, shortness of breath, depression, balance problems, easy bruising.   All other systems are reviewed and negative.    PHYSICAL EXAM: VS:  BP 106/68   Pulse (!) 36   Ht 5\' 11"  (1.803 m)   Wt 193 lb (87.5 kg)   SpO2 91%   BMI 26.92 kg/m  , BMI Body mass index is 26.92 kg/m. GEN: Well nourished, well developed, in no acute distress  HEENT: normal  Neck: no JVD, carotid bruits, or  masses Cardiac: iRRR; no murmurs, rubs, or gallops,no edema  Respiratory:  clear to auscultation bilaterally, normal work of breathing GI: soft, nontender, nondistended, + BS MS: no deformity or atrophy  Skin: warm and dry Neuro:  Strength and sensation are intact Psych: euthymic mood, full affect  EKG:  EKG is ordered today. Personal review of the ekg ordered  shows this rhythm, rate 72, multiple PACs, nonspecific interventricular conduction delay, lateral T wave depressions  Recent Labs: 05/01/2018: TSH 0.913 05/04/2018: Magnesium 2.0 05/15/2018: ALT 20; BUN 19; Creatinine 0.84; Hemoglobin 9.0; Platelet Count 255; Potassium 4.4; Sodium 140    Lipid Panel     Component Value Date/Time   CHOL 202 (H) 08/27/2015 0946   TRIG 91.0 08/27/2015 0946   HDL 60.00 08/27/2015 0946   CHOLHDL 3 08/27/2015 0946   VLDL 18.2 08/27/2015 0946  LDLCALC 123 (H) 08/27/2015 0946   LDLDIRECT 100.0 02/28/2017 1544     Wt Readings from Last 3 Encounters:  05/21/18 193 lb (87.5 kg)  05/15/18 199 lb 8 oz (90.5 kg)  05/13/18 203 lb 3.2 oz (92.2 kg)      Other studies Reviewed: Additional studies/ records that were reviewed today include: TTE 05/02/18 Review of the above records today demonstrates:  - Left ventricle: The cavity size was normal. Wall thickness was   increased in a pattern of mild LVH. Systolic function was   severely reduced. The estimated ejection fraction was in the   range of 25% to 30%. Inferior and inferolateral akinesis.   Anterolateral severe hypokinesis. The study was not technically   sufficient to allow evaluation of LV diastolic dysfunction due to   atrial fibrillation. - Aortic valve: Trileaflet; moderately calcified leaflets. There   was no stenosis. - Mitral valve: Mildly calcified annulus. There was mild   regurgitation. - Left atrium: The atrium was mildly dilated. - Right ventricle: The cavity size was normal. Systolic function   was mildly reduced. -  Tricuspid valve: Peak RV-RA gradient (S): 39 mm Hg. - Pulmonary arteries: PA peak pressure: 54 mm Hg (S). - Systemic veins: IVC measured 2.4 cm with < 50% respirophasic   variation, suggesting RA pressure 15 mmHg.   ASSESSMENT AND PLAN:  1.  Typical atrial flutter: Currently on Eliquis.  Discussed with him the possibility of ablation versus continued medical management.  Risks and benefits of ablation were discussed and include bleeding, tamponade, heart block, stroke.  The patient understands these risks and is agreed to the procedure.  This patients CHA2DS2-VASc Score and unadjusted Ischemic Stroke Rate (% per year) is equal to 3.2 % stroke rate/year from a score of 3  Above score calculated as 1 point each if present [CHF, HTN, DM, Vascular=MI/PAD/Aortic Plaque, Age if 65-74, or Male] Above score calculated as 2 points each if present [Age > 75, or Stroke/TIA/TE]  2.  Systolic heart failure: Etiology and chronicity at this point are unclear.  His could be due to a tachycardia mediated cardiomyopathy from his rapid atrial flutter.  He does have Q waves on his EKG.  Plan per primary cardiology.  3.  Stage IIIb non-small cell lung cancer: Currently on chemotherapy.  Current medicines are reviewed at length with the patient today.   The patient does not have concerns regarding his medicines.  The following changes were made today:  none  Labs/ tests ordered today include:  No orders of the defined types were placed in this encounter.  Case discussed with referring cardiology  Disposition:   FU with Jr Milliron 1 months  Signed, Jionni Helming Meredith Leeds, MD  05/21/2018 2:39 PM     [facility] [address] [place] [address] [phone] (office) [phone] (fax)

## 2018-05-21 NOTE — Patient Instructions (Signed)
Medication Instructions:  Your physician recommends that you continue on your current medications as directed. Please refer to the Current Medication list given to you today.  If you need a refill on your cardiac medications before your next appointment, please call your pharmacy.   Lab work: None ordered  If you have labs (blood work) drawn today and your tests are completely normal, you will receive your results only by: Marland Kitchen MyChart Message (if you have MyChart) OR . A paper copy in the mail If you have any lab test that is abnormal or we need to change your treatment, we will call you to review the results.  Testing/Procedures: Your physician has recommended that you have an ablation. Catheter ablation is a medical procedure used to treat some cardiac arrhythmias (irregular heartbeats). During catheter ablation, a long, thin, flexible tube is put into a blood vessel in your groin (upper thigh), or neck. This tube is called an ablation catheter. It is then guided to your heart through the blood vessel. Radio frequency waves destroy small areas of heart tissue where abnormal heartbeats may cause an arrhythmia to start.      Please call the office when you are ready to schedule ablation, ask for Amiri Riechers, RN    The following dates are available (these are subject to change): 11/22, 12/06,  12/11, 12/12, 12/18, 12/20  Instructions for your ablation: 1. Please arrive at the [facility], Main Entrance "A", of [facility] at _____  on _______. 2. Do not eat or drink after midnight the night prior to the procedure. 3. Do not miss any doses of ELIQUIS prior to the morning of the procedure.  4. Do not take any medications the morning of the procedure. 5. Both of your groins will need to be shaved for this procedure (if needed). We ask that you do this yourself at home 1-2 days prior to the procedure.  If you are unable/uncomfortable to do yourself, the hospital staff will shave you the day of  your procedure (if needed). 6. Plan for an overnight stay in the hospital. 7. You will need someone to drive you home at discharge.  Follow-Up: To be determined once procedure is scheduled.  Thank you for choosing [facility]!!   Trinidad Curet, RN [phone]  Any Other Special Instructions Will Be Listed Below (If Applicable).  Cardiac Ablation Cardiac ablation is a procedure to disable (ablate) a small amount of heart tissue in very specific places. The heart has many electrical connections. Sometimes these connections are abnormal and can cause the heart to beat very fast or irregularly. Ablating some of the problem areas can improve the heart rhythm or return it to normal. Ablation may be done for people who:  Have Wolff-Parkinson-White syndrome.  Have fast heart rhythms (tachycardia).  Have taken medicines for an abnormal heart rhythm (arrhythmia) that were not effective or caused side effects.  Have a high-risk heartbeat that may be life-threatening.  During the procedure, a small incision is made in the neck or the groin, and a long, thin, flexible tube (catheter) is inserted into the incision and moved to the heart. Small devices (electrodes) on the tip of the catheter will send out electrical currents. A type of X-ray (fluoroscopy) will be used to help guide the catheter and to provide images of the heart. Tell a health care provider about:  Any allergies you have.  All medicines you are taking, including vitamins, herbs, eye drops, creams, and over-the-counter medicines.  Any problems you or family members have had with anesthetic medicines.  Any blood disorders you have.  Any surgeries you have had.  Any medical conditions you have, such as kidney failure.  Whether you are pregnant or may be pregnant. What are the risks? Generally, this is a safe procedure. However, problems may occur, including:  Infection.  Bruising and bleeding at the catheter  insertion site.  Bleeding into the chest, especially into the sac that surrounds the heart. This is a serious complication.  Stroke or blood clots.  Damage to other structures or organs.  Allergic reaction to medicines or dyes.  Need for a permanent pacemaker if the normal electrical system is damaged. A pacemaker is a small computer that sends electrical signals to the heart and helps your heart beat normally.  The procedure not being fully effective. This may not be recognized until months later. Repeat ablation procedures are sometimes required.  What happens before the procedure?  Follow instructions from your health care provider about eating or drinking restrictions.  Ask your health care provider about: ? Changing or stopping your regular medicines. This is especially important if you are taking diabetes medicines or blood thinners. ? Taking medicines such as aspirin and ibuprofen. These medicines can thin your blood. Do not take these medicines before your procedure if your health care provider instructs you not to.  Plan to have someone take you home from the hospital or clinic.  If you will be going home right after the procedure, plan to have someone with you for 24 hours. What happens during the procedure?  To lower your risk of infection: ? Your health care team will wash or sanitize their hands. ? Your skin will be washed with soap. ? Hair may be removed from the incision area.  An IV tube will be inserted into one of your veins.  You will be given a medicine to help you relax (sedative).  The skin on your neck or groin will be numbed.  An incision will be made in your neck or your groin.  A needle will be inserted through the incision and into a large vein in your neck or groin.  A catheter will be inserted into the needle and moved to your heart.  Dye may be injected through the catheter to help your surgeon see the area of the heart that needs  treatment.  Electrical currents will be sent from the catheter to ablate heart tissue in desired areas. There are three types of energy that may be used to ablate heart tissue: ? Heat (radiofrequency energy). ? Laser energy. ? Extreme cold (cryoablation).  When the necessary tissue has been ablated, the catheter will be removed.  Pressure will be held on the catheter insertion area to prevent excessive bleeding.  A bandage (dressing) will be placed over the catheter insertion area. The procedure may vary among health care providers and hospitals. What happens after the procedure?  Your blood pressure, heart rate, breathing rate, and blood oxygen level will be monitored until the medicines you were given have worn off.  Your catheter insertion area will be monitored for bleeding. You will need to lie still for a few hours to ensure that you do not bleed from the catheter insertion area.  Do not drive for 24 hours or as long as directed by your health care provider. Summary  Cardiac ablation is a procedure to disable (ablate) a small amount of heart tissue in very specific places. Ablating  some of the problem areas can improve the heart rhythm or return it to normal.  During the procedure, electrical currents will be sent from the catheter to ablate heart tissue in desired areas. This information is not intended to replace advice given to you by your health care provider. Make sure you discuss any questions you have with your health care provider. Document Released: 11/19/2008 Document Revised: 05/22/2016 Document Reviewed: 05/22/2016 Elsevier Interactive Patient Education  [employer].

## 2018-05-22 ENCOUNTER — Telehealth: Payer: Self-pay | Admitting: Cardiology

## 2018-05-22 NOTE — Addendum Note (Signed)
Addended by: Rose Phi on: 05/22/2018 03:50 PM   Modules accepted: Orders

## 2018-05-22 NOTE — Telephone Encounter (Signed)
Follow Up:     Pt is calling to let Sherri know he can be scheduled for his Ablation on 06-07-18 please.

## 2018-05-22 NOTE — Telephone Encounter (Signed)
Left the pt a message to call back and request to speak with a triage nurse.  Will also route this message to Dr [name] RN for further review, and follow-up with the pt, with requested date for scheduling of his ablation.

## 2018-05-24 NOTE — Telephone Encounter (Signed)
Procedure scheduled for 11/22. Post procedure f/u scheduled for 07/25/18. Patient verbalized understanding and agreeable to plan.

## 2018-05-27 ENCOUNTER — Other Ambulatory Visit: Payer: Self-pay | Admitting: Internal Medicine

## 2018-05-27 ENCOUNTER — Other Ambulatory Visit: Payer: Self-pay | Admitting: Nurse Practitioner

## 2018-05-27 DIAGNOSIS — Z7189 Other specified counseling: Secondary | ICD-10-CM

## 2018-05-27 DIAGNOSIS — Z5112 Encounter for antineoplastic immunotherapy: Secondary | ICD-10-CM

## 2018-05-27 DIAGNOSIS — C3432 Malignant neoplasm of lower lobe, left bronchus or lung: Secondary | ICD-10-CM

## 2018-05-27 DIAGNOSIS — R5382 Chronic fatigue, unspecified: Secondary | ICD-10-CM

## 2018-06-02 ENCOUNTER — Other Ambulatory Visit: Payer: Self-pay | Admitting: Internal Medicine

## 2018-06-02 DIAGNOSIS — R5382 Chronic fatigue, unspecified: Secondary | ICD-10-CM

## 2018-06-02 DIAGNOSIS — Z23 Encounter for immunization: Secondary | ICD-10-CM

## 2018-06-02 DIAGNOSIS — Z5112 Encounter for antineoplastic immunotherapy: Secondary | ICD-10-CM

## 2018-06-02 DIAGNOSIS — C3432 Malignant neoplasm of lower lobe, left bronchus or lung: Secondary | ICD-10-CM

## 2018-06-02 DIAGNOSIS — Z7189 Other specified counseling: Secondary | ICD-10-CM

## 2018-06-05 ENCOUNTER — Inpatient Hospital Stay: Payer: BLUE CROSS/BLUE SHIELD

## 2018-06-05 ENCOUNTER — Telehealth: Payer: Self-pay | Admitting: Cardiology

## 2018-06-05 ENCOUNTER — Inpatient Hospital Stay: Payer: BLUE CROSS/BLUE SHIELD | Attending: Internal Medicine

## 2018-06-05 ENCOUNTER — Other Ambulatory Visit: Payer: Self-pay | Admitting: Cardiology

## 2018-06-05 VITALS — BP 119/64 | HR 62 | Temp 98.3°F | Resp 17 | Ht 71.0 in | Wt 193.5 lb

## 2018-06-05 DIAGNOSIS — Z79899 Other long term (current) drug therapy: Secondary | ICD-10-CM | POA: Diagnosis not present

## 2018-06-05 DIAGNOSIS — C3432 Malignant neoplasm of lower lobe, left bronchus or lung: Secondary | ICD-10-CM | POA: Insufficient documentation

## 2018-06-05 DIAGNOSIS — Z5112 Encounter for antineoplastic immunotherapy: Secondary | ICD-10-CM | POA: Diagnosis not present

## 2018-06-05 DIAGNOSIS — Z5111 Encounter for antineoplastic chemotherapy: Secondary | ICD-10-CM

## 2018-06-05 LAB — CMP (CANCER CENTER ONLY)
ALT: 9 U/L (ref 0–44)
AST: 17 U/L (ref 15–41)
Albumin: 3.4 g/dL — ABNORMAL LOW (ref 3.5–5.0)
Alkaline Phosphatase: 92 U/L (ref 38–126)
Anion gap: 10 (ref 5–15)
BUN: 16 mg/dL (ref 8–23)
CO2: 27 mmol/L (ref 22–32)
Calcium: 9.9 mg/dL (ref 8.9–10.3)
Chloride: 104 mmol/L (ref 98–111)
Creatinine: 0.82 mg/dL (ref 0.61–1.24)
GFR, Est AFR Am: >60 mL/min (ref >60)
GFR, Estimated: >60 mL/min (ref >60)
Glucose, Bld: 164 mg/dL — ABNORMAL HIGH (ref 70–99)
Potassium: 4.2 mmol/L (ref 3.5–5.1)
Sodium: 141 mmol/L (ref 135–145)
Total Bilirubin: 0.6 mg/dL (ref 0.3–1.2)
Total Protein: 6.9 g/dL (ref 6.5–8.1)

## 2018-06-05 LAB — CBC WITH DIFFERENTIAL (CANCER CENTER ONLY)
Abs Immature Granulocytes: 0.03 10*3/uL (ref 0.00–0.07)
Basophils Absolute: 0 10*3/uL (ref 0.0–0.1)
Basophils Relative: 0 %
Eosinophils Absolute: 0 10*3/uL (ref 0.0–0.5)
Eosinophils Relative: 0 %
HCT: 27.8 % — ABNORMAL LOW (ref 39.0–52.0)
Hemoglobin: 8.9 g/dL — ABNORMAL LOW (ref 13.0–17.0)
Immature Granulocytes: 1 %
Lymphocytes Relative: 13 %
Lymphs Abs: 0.7 10*3/uL (ref 0.7–4.0)
MCH: 33.7 pg (ref 26.0–34.0)
MCHC: 32 g/dL (ref 30.0–36.0)
MCV: 105.3 fL — ABNORMAL HIGH (ref 80.0–100.0)
Monocytes Absolute: 0.6 10*3/uL (ref 0.1–1.0)
Monocytes Relative: 11 %
Neutro Abs: 4.1 10*3/uL (ref 1.7–7.7)
Neutrophils Relative %: 75 %
Platelet Count: 286 10*3/uL (ref 150–400)
RBC: 2.64 MIL/uL — ABNORMAL LOW (ref 4.22–5.81)
RDW: 16.2 % — ABNORMAL HIGH (ref 11.5–15.5)
WBC Count: 5.5 10*3/uL (ref 4.0–10.5)
nRBC: 0 % (ref 0.0–0.2)

## 2018-06-05 LAB — TSH: TSH: 0.291 u[IU]/mL — ABNORMAL LOW (ref 0.320–4.118)

## 2018-06-05 MED ORDER — CARVEDILOL 3.125 MG PO TABS: 3.1250 mg | ORAL_TABLET | Freq: Two times a day (BID) | ORAL | 5 refills | Status: DC

## 2018-06-05 MED ORDER — APIXABAN 5 MG PO TABS: 5.0000 mg | ORAL_TABLET | Freq: Two times a day (BID) | ORAL | 5 refills | Status: AC

## 2018-06-05 MED ORDER — SODIUM CHLORIDE 0.9% FLUSH
10.0000 mL | INTRAVENOUS | Status: DC | PRN
  Administered 2018-06-05: 10 mL via INTRAVENOUS
  Filled 2018-06-05: qty 10

## 2018-06-05 MED ORDER — CYANOCOBALAMIN 1000 MCG/ML IJ SOLN: 1000.0000 ug | Freq: Once | INTRAMUSCULAR | Status: DC

## 2018-06-05 MED ORDER — HEPARIN SOD (PORK) LOCK FLUSH 100 UNIT/ML IV SOLN
500.0000 [IU] | Freq: Once | INTRAVENOUS | Status: AC | PRN
  Administered 2018-06-05: 500 [IU]
  Filled 2018-06-05: qty 5

## 2018-06-05 MED ORDER — PROCHLORPERAZINE MALEATE 10 MG PO TABS
10.0000 mg | ORAL_TABLET | Freq: Once | ORAL | Status: AC
  Administered 2018-06-05: 10 mg via ORAL

## 2018-06-05 MED ORDER — PROCHLORPERAZINE MALEATE 10 MG PO TABS
ORAL_TABLET | ORAL | Status: AC
  Filled 2018-06-05: qty 1

## 2018-06-05 MED ORDER — LOSARTAN POTASSIUM 25 MG PO TABS: 25.0000 mg | ORAL_TABLET | Freq: Every day | ORAL | 5 refills | Status: DC

## 2018-06-05 MED ORDER — SODIUM CHLORIDE 0.9% FLUSH
10.0000 mL | INTRAVENOUS | Status: DC | PRN
  Administered 2018-06-05: 10 mL
  Filled 2018-06-05: qty 10

## 2018-06-05 MED ORDER — FUROSEMIDE 20 MG PO TABS: 20.0000 mg | ORAL_TABLET | Freq: Every day | ORAL | 5 refills | Status: DC | PRN

## 2018-06-05 MED ORDER — SODIUM CHLORIDE 0.9 % IV SOLN
200.0000 mg | Freq: Once | INTRAVENOUS | Status: AC
  Administered 2018-06-05: 200 mg via INTRAVENOUS
  Filled 2018-06-05: qty 8

## 2018-06-05 MED ORDER — SODIUM CHLORIDE 0.9 % IV SOLN
500.0000 mg/m2 | Freq: Once | INTRAVENOUS | Status: AC
  Administered 2018-06-05: 1100 mg via INTRAVENOUS
  Filled 2018-06-05: qty 40

## 2018-06-05 MED ORDER — SODIUM CHLORIDE 0.9 % IV SOLN
Freq: Once | INTRAVENOUS | Status: AC
  Administered 2018-06-05: 12:00:00 via INTRAVENOUS
  Filled 2018-06-05: qty 250

## 2018-06-05 NOTE — Addendum Note (Signed)
Addended by: Rockne Menghini on: 06/05/2018 07:23 PM   Modules accepted: Orders

## 2018-06-05 NOTE — Progress Notes (Signed)
Patient stated to this nurse that he is scheduled for a heart ablation on Friday 06/07/18 and would like to know if it was okay to get treatment today and still do the ablation on Friday. This nurse inquired from Dr. Julien Nordmann who stated that patient is okay to proceed with both as scheduled unless patient would like to delay chemo treatment for a week and return next week. Patient chose to continue with treatment as planned today and go for his ablation on Friday.

## 2018-06-05 NOTE — Patient Instructions (Signed)
[facility] Discharge Instructions for Patients Receiving Chemotherapy  Today you received the following chemotherapy agents Pembrolizumab Beryle Flock) & Pemetrexed (Alimta).  To help prevent nausea and vomiting after your treatment, we encourage you to take your nausea medication as prescribed.   If you develop nausea and vomiting that is not controlled by your nausea medication, call the clinic.   BELOW ARE SYMPTOMS THAT SHOULD BE REPORTED IMMEDIATELY:  *FEVER GREATER THAN 100.5 F  *CHILLS WITH OR WITHOUT FEVER  NAUSEA AND VOMITING THAT IS NOT CONTROLLED WITH YOUR NAUSEA MEDICATION  *UNUSUAL SHORTNESS OF BREATH  *UNUSUAL BRUISING OR BLEEDING  TENDERNESS IN MOUTH AND THROAT WITH OR WITHOUT PRESENCE OF ULCERS  *URINARY PROBLEMS  *BOWEL PROBLEMS  UNUSUAL RASH Items with * indicate a potential emergency and should be followed up as soon as possible.  Feel free to call the clinic should you have any questions or concerns. The clinic phone number is [phone].  Please show the [place] at check-in to the Emergency Department and triage nurse.

## 2018-06-05 NOTE — Telephone Encounter (Signed)
 *  STAT* If patient is at the pharmacy, call can be transferred to refill team.   1. Which medications need to be refilled? (please list name of each medication and dose if known)  losartan (COZAAR) 25 MG tablet carvedilol (COREG) 3.125 MG tablet apixaban (ELIQUIS) 5 MG TABS tablet furosemide (LASIX) 20 MG tablet  2. Which pharmacy/location (including street and city if local pharmacy) is medication to be sent to? [facility] [place]  3. Do they need a 30 day or 90 day supply? 30  Patient was prescribed medications while he was in the hospital and needs refills

## 2018-06-05 NOTE — Telephone Encounter (Signed)
Pt does not want ablation at this time.  Cancelled procedure and post procedure appt. Will forward to Dr. [name] for advisement and follow up recommendation. Wife/pt aware I will call back once reviewed by [facility].

## 2018-06-05 NOTE — Telephone Encounter (Signed)
New Message:      Pt's wife is calling and states the pt would like to cancel his ablation on Friday 06/07/18. She states she also has some additional questions about who would be taking over the pt's prescriptions? [facility] or [facility]. Please advise.

## 2018-06-07 ENCOUNTER — Ambulatory Visit (HOSPITAL_COMMUNITY): Admission: RE | Admit: 2018-06-07 | Payer: BLUE CROSS/BLUE SHIELD | Source: Ambulatory Visit | Admitting: Cardiology

## 2018-06-07 ENCOUNTER — Encounter (HOSPITAL_COMMUNITY): Admission: RE | Payer: Self-pay | Source: Ambulatory Visit

## 2018-06-07 SURGERY — A-FLUTTER ABLATION
Anesthesia: General

## 2018-06-10 NOTE — Telephone Encounter (Signed)
Informed that Dr. [name] recommends a 39mo f/u. Pt already scheduled for f/u in February and made aware to keep that appt date/time. Wife is agreeable to plan.

## 2018-06-26 ENCOUNTER — Inpatient Hospital Stay: Payer: BLUE CROSS/BLUE SHIELD

## 2018-06-26 ENCOUNTER — Inpatient Hospital Stay: Payer: BLUE CROSS/BLUE SHIELD | Attending: Internal Medicine

## 2018-06-26 ENCOUNTER — Telehealth: Payer: Self-pay | Admitting: Internal Medicine

## 2018-06-26 ENCOUNTER — Encounter: Payer: Self-pay | Admitting: Oncology

## 2018-06-26 ENCOUNTER — Inpatient Hospital Stay (HOSPITAL_BASED_OUTPATIENT_CLINIC_OR_DEPARTMENT_OTHER): Payer: BLUE CROSS/BLUE SHIELD | Admitting: Oncology

## 2018-06-26 VITALS — BP 116/66 | HR 62 | Temp 98.0°F | Resp 20 | Ht 71.0 in | Wt 200.8 lb

## 2018-06-26 DIAGNOSIS — C3432 Malignant neoplasm of lower lobe, left bronchus or lung: Secondary | ICD-10-CM

## 2018-06-26 DIAGNOSIS — Z5112 Encounter for antineoplastic immunotherapy: Secondary | ICD-10-CM

## 2018-06-26 DIAGNOSIS — K621 Rectal polyp: Secondary | ICD-10-CM

## 2018-06-26 DIAGNOSIS — K635 Polyp of colon: Secondary | ICD-10-CM | POA: Diagnosis not present

## 2018-06-26 DIAGNOSIS — Z5111 Encounter for antineoplastic chemotherapy: Secondary | ICD-10-CM | POA: Insufficient documentation

## 2018-06-26 DIAGNOSIS — K219 Gastro-esophageal reflux disease without esophagitis: Secondary | ICD-10-CM | POA: Insufficient documentation

## 2018-06-26 DIAGNOSIS — D649 Anemia, unspecified: Secondary | ICD-10-CM | POA: Diagnosis not present

## 2018-06-26 DIAGNOSIS — R208 Other disturbances of skin sensation: Secondary | ICD-10-CM | POA: Diagnosis not present

## 2018-06-26 LAB — CBC WITH DIFFERENTIAL (CANCER CENTER ONLY)
Abs Immature Granulocytes: 0.04 K/uL (ref 0.00–0.07)
Basophils Absolute: 0 K/uL (ref 0.0–0.1)
Basophils Relative: 0 %
Eosinophils Absolute: 0 K/uL (ref 0.0–0.5)
Eosinophils Relative: 0 %
HCT: 26.4 % — ABNORMAL LOW (ref 39.0–52.0)
Hemoglobin: 8.2 g/dL — ABNORMAL LOW (ref 13.0–17.0)
Immature Granulocytes: 1 %
Lymphocytes Relative: 14 %
Lymphs Abs: 0.6 K/uL — ABNORMAL LOW (ref 0.7–4.0)
MCH: 33.6 pg (ref 26.0–34.0)
MCHC: 31.1 g/dL (ref 30.0–36.0)
MCV: 108.2 fL — ABNORMAL HIGH (ref 80.0–100.0)
Monocytes Absolute: 0.4 K/uL (ref 0.1–1.0)
Monocytes Relative: 9 %
Neutro Abs: 3.2 K/uL (ref 1.7–7.7)
Neutrophils Relative %: 76 %
Platelet Count: 315 K/uL (ref 150–400)
RBC: 2.44 MIL/uL — ABNORMAL LOW (ref 4.22–5.81)
RDW: 17 % — ABNORMAL HIGH (ref 11.5–15.5)
WBC Count: 4.1 K/uL (ref 4.0–10.5)
nRBC: 0 % (ref 0.0–0.2)

## 2018-06-26 LAB — CMP (CANCER CENTER ONLY)
ALT: 12 U/L (ref 0–44)
AST: 18 U/L (ref 15–41)
Albumin: 3.4 g/dL — ABNORMAL LOW (ref 3.5–5.0)
Alkaline Phosphatase: 93 U/L (ref 38–126)
Anion gap: 8 (ref 5–15)
BUN: 18 mg/dL (ref 8–23)
CO2: 27 mmol/L (ref 22–32)
Calcium: 9.4 mg/dL (ref 8.9–10.3)
Chloride: 107 mmol/L (ref 98–111)
Creatinine: 0.88 mg/dL (ref 0.61–1.24)
GFR, Est AFR Am: >60 mL/min (ref >60)
GFR, Estimated: >60 mL/min (ref >60)
Glucose, Bld: 140 mg/dL — ABNORMAL HIGH (ref 70–99)
Potassium: 4.4 mmol/L (ref 3.5–5.1)
Sodium: 142 mmol/L (ref 135–145)
Total Bilirubin: 0.3 mg/dL (ref 0.3–1.2)
Total Protein: 6.9 g/dL (ref 6.5–8.1)

## 2018-06-26 MED ORDER — CYANOCOBALAMIN 1000 MCG/ML IJ SOLN
INTRAMUSCULAR | Status: AC
  Filled 2018-06-26: qty 1

## 2018-06-26 MED ORDER — PROCHLORPERAZINE MALEATE 10 MG PO TABS
ORAL_TABLET | ORAL | Status: AC
  Filled 2018-06-26: qty 1

## 2018-06-26 MED ORDER — PROCHLORPERAZINE MALEATE 10 MG PO TABS
10.0000 mg | ORAL_TABLET | Freq: Once | ORAL | Status: AC
  Administered 2018-06-26: 10 mg via ORAL

## 2018-06-26 MED ORDER — SODIUM CHLORIDE 0.9% FLUSH
10.0000 mL | INTRAVENOUS | Status: DC | PRN
  Administered 2018-06-26: 10 mL
  Filled 2018-06-26: qty 10

## 2018-06-26 MED ORDER — SODIUM CHLORIDE 0.9 % IV SOLN
200.0000 mg | Freq: Once | INTRAVENOUS | Status: AC
  Administered 2018-06-26: 200 mg via INTRAVENOUS
  Filled 2018-06-26: qty 8

## 2018-06-26 MED ORDER — HEPARIN SOD (PORK) LOCK FLUSH 100 UNIT/ML IV SOLN
500.0000 [IU] | Freq: Once | INTRAVENOUS | Status: AC | PRN
  Administered 2018-06-26: 500 [IU]
  Filled 2018-06-26: qty 5

## 2018-06-26 MED ORDER — SODIUM CHLORIDE 0.9 % IV SOLN
500.0000 mg/m2 | Freq: Once | INTRAVENOUS | Status: AC
  Administered 2018-06-26: 1100 mg via INTRAVENOUS
  Filled 2018-06-26: qty 40

## 2018-06-26 MED ORDER — OMEPRAZOLE 20 MG PO CPDR: 20.0000 mg | DELAYED_RELEASE_CAPSULE | Freq: Every day | ORAL | 1 refills | Status: AC

## 2018-06-26 MED ORDER — CYANOCOBALAMIN 1000 MCG/ML IJ SOLN
1000.0000 ug | Freq: Once | INTRAMUSCULAR | Status: AC
  Administered 2018-06-26: 1000 ug via INTRAMUSCULAR

## 2018-06-26 MED ORDER — SODIUM CHLORIDE 0.9 % IV SOLN
Freq: Once | INTRAVENOUS | Status: AC
  Administered 2018-06-26: 12:00:00 via INTRAVENOUS
  Filled 2018-06-26: qty 250

## 2018-06-26 NOTE — Assessment & Plan Note (Addendum)
This is a very pleasant 67 year old white male with recurrent non-small cell lung cancer initially diagnosed as stage IIIB non-small cell lung cancer favoring adenocarcinoma status post concurrent chemoradiation followed by consolidation chemotherapy. His last treatment was in September 2017. Restaging scan showed evidence for disease progression and the patient was started on systemic chemotherapy with carboplatin, Alimta and Keytruda for 4 cycles.  His repeat imaging studies showed improvement of his disease.  He is currently undergoing maintenance treatment with Alimta and Keytruda status post 19 cycles.The patient is tolerating his treatment well. We will proceed with cycle #20 today as scheduled. I have discussed with the patient that he is more anemic and he did require a blood transfusion during his recent hospitalization in October 2019.  The Alimta and Keytruda that he is currently on typically does not cause anemia to the point that transfusions are required.  Review of his medical records show that he has had heme positive stools in the past.  He did undergo a colonoscopy in 2017 and several polyps were removed at that time.  However, there remained a polyp that remained high risk for colon cancer.  A polypectomy was recommended but the patient did not undergo this procedure and did not have further evaluation or work-up due to his diagnosis of lung cancer at that time.  Stool for occult blood was ordered in October 2019, but the patient did not return the stool cards.  I have again ordered stool for occult blood  today.  I stressed the importance of bringing the samples back to [place].  I reviewed the patient's record with Dr. Julien Nordmann who recommends referral back to Dr. Havery Moros to discuss options regarding the polyp that remains.  The patient may require a blood transfusion if hemoglobin is 8.0 or less.  Will hold off on transfusion at this time.  For reflux symptoms, I have given him prescription  for omeprazole 20 mg daily.  The patient will have a restaging CT scan of the chest, abdomen, pelvis prior to his next visit.  He will follow-up in 3 weeks for evaluation prior to his next cycle of treatment.  All questions were answered. The patient knows to call the clinic with any problems, questions or concerns. We can certainly see the patient much sooner if necessary.

## 2018-06-26 NOTE — Progress Notes (Signed)
[place] OFFICE PROGRESS NOTE  [name], MD [place] [address]  DIAGNOSIS: recurrent non-small cell lung cancer initially diagnosed as Stage IIIB (T1b, N3, M0) non-small cell lung cancer favoring adenocarcinoma presented with left lower lobe pulmonary nodule in addition to left hilar and bilateral mediastinal lymphadenopathy in addition to right supraclavicular lymph nodes diagnosed in April 2017.  PRIOR THERAPY: 1) Concurrent chemoradiation with weekly carboplatin for AUC of 2 and paclitaxel 45 mg/M2 started on 11/15/2015. He is status post 6 cycles. Last dose was getting 12/20/2015 with partial response. 2) Consolidation chemotherapy with carboplatin for AUC of 5 and paclitaxel 175 MG/M2 every 3 weeks with Neulasta support. Status post 3cycles. Last dose was given 04/03/2016.  CURRENT THERAPY: systemic chemotherapy with carboplatin for AUC of 5, Alimta 500 MG/M2 and Ketruda 200 MG IV every 3 weeks. First dose 05/16/2017.  Status post 19 cycles.  Starting from cycle #5 the patient is treated with maintenance Alimta and Keytruda only.  INTERVAL HISTORY: Brian White 67 y.o. male returns for a routine follow-up visit accompanied by his wife.  The patient is feeling fine today and has no specific complaints except for burning after eating and his upper chest.  Pain worsens with rest.  He has not taken any medication for this.  The patient denies fevers and chills.  Denies chest pain, shortness of breath, cough, hemoptysis.  Denies nausea, vomiting, constipation, diarrhea.  Denies recent weight loss or night sweats.  The patient is here for evaluation prior to cycle #20 of maintenance Alimta and Keytruda.  MEDICAL HISTORY: Past Medical History:  Diagnosis Date  . Adenocarcinoma, lung ([place]) 10/08/2015   2.4cm spiculated LLL nodule suspicious for primary bronchogenic carcinoma by CT 09/2015 - adenocarcinoma by endobronchial biopsy  . Allergy   .  Arthritis   . CAD (coronary artery disease)    mild MI; pt unaware - seen on stress test  . COPD (chronic obstructive pulmonary disease) ([place]) 06/26/2011   Mild centrilobular and paraseptal emphysema by CT 09/2015   . ED (erectile dysfunction)   . H/O anaphylactic shock 2013   hives,hypotension,syncope  . H/O seasonal allergies   . Herpes zoster 12/06/2015  . Hypertension   . Polycythemia 01/25/2014  . Prostate cancer [facility]) 2013   s/p radiation therapy March 2013, planned f/u with uro after XRT  . Radiation 09/18/2011 through 11/10/2011   Prostate 7600 cGy 40 sessions, seminal vesicles 5600 cGy 40 sessions    . Radiation 11/10/15-12/22/15   chest 60 Gy  . Shortness of breath dyspnea   . Smoker   . Thoracic aorta atherosclerosis ([place]) 10/08/2015   Mild by CT 09/2015     ALLERGIES:  is allergic to onion.  MEDICATIONS:  Current Outpatient Medications  Medication Sig Dispense Refill  . acetaminophen (TYLENOL) 500 MG tablet Take 1,000 mg by mouth every 6 (six) hours as needed (for pain.).    Marland Kitchen apixaban (ELIQUIS) 5 MG TABS tablet Take 1 tablet (5 mg total) by mouth 2 (two) times daily. 60 tablet 5  . budesonide-formoterol (SYMBICORT) 160-4.5 MCG/ACT inhaler Inhale 1 puff into the lungs 2 (two) times daily. 3 Inhaler 3  . carvedilol (COREG) 3.125 MG tablet Take 1 tablet (3.125 mg total) by mouth 2 (two) times daily with a meal. 60 tablet 5  . cetirizine (ZYRTEC) 10 MG chewable tablet Chew 10 mg by mouth daily as needed (for seasonal allergies).     Marland Kitchen dexamethasone (DECADRON) 4 MG tablet  TAKE 1 TABLET BY MOUTH TWICE DAILY THE DAY BEFORE, DAY OF AND AFTER THE CHEMOTHERAPY EVERY 3 WEEKS 40 tablet 0  . folic acid (FOLVITE) 1 MG tablet TAKE 1 TABLET(1 MG) BY MOUTH DAILY (Patient taking differently: Take 1 mg by mouth daily. TAKE 1 TABLET(1 MG) BY MOUTH DAILY) 30 tablet 0  . furosemide (LASIX) 20 MG tablet Take 1 tablet (20 mg total) by mouth daily as needed for fluid or edema. 30  tablet 5  . guaiFENesin (MUCINEX) 600 MG 12 hr tablet Take 600 mg by mouth 2 (two) times daily as needed (for cough/congestion).     Marland Kitchen ibuprofen (ADVIL,MOTRIN) 200 MG tablet Take 800 mg by mouth every 8 (eight) hours as needed (for pain.).    Marland Kitchen lidocaine-prilocaine (EMLA) cream Apply 1 application topically as needed. Squeeze a  small amount of cream to cotton ball and apply to port site 1-2 hours prior to chemotherapy. Do not rub in the cream and cover  with plastic wrap. (Patient taking differently: Apply 1 application topically daily as needed (prior to port being accessed for chemo.). Squeeze a  small amount of cream to cotton ball and apply to port site 1-2 hours prior to chemotherapy. Do not rub in the cream and cover  with plastic wrap.) 30 g 0  . losartan (COZAAR) 25 MG tablet Take 1 tablet (25 mg total) by mouth daily. 30 tablet 5  . mirtazapine (REMERON) 15 MG tablet TAKE 1 TABLET(15 MG) BY MOUTH AT BEDTIME (Patient taking differently: Take 15 mg by mouth at bedtime as needed (mood/sleep.). ) 30 tablet 0  . spironolactone (ALDACTONE) 25 MG tablet Take 0.5 tablets (12.5 mg total) by mouth daily. 30 tablet 0  . omeprazole (PRILOSEC) 20 MG capsule Take 1 capsule (20 mg total) by mouth daily. 30 capsule 1   No current facility-administered medications for this visit.    Facility-Administered Medications Ordered in Other Visits  Medication Dose Route Frequency Provider Last Rate Last Dose  . sodium chloride flush (NS) 0.9 % injection 10 mL  10 mL Intracatheter PRN [name], MD   10 mL at 06/26/18 1345    SURGICAL HISTORY:  Past Surgical History:  Procedure Laterality Date  . COLONOSCOPY W/ BIOPSIES AND POLYPECTOMY  10/11/15   mult TAs, 3cm rectal polypoid lesion; rec further treatment, diverticulosis ([place])  . ENDOBRONCHIAL ULTRASOUND Bilateral 10/18/2015   Procedure: ENDOBRONCHIAL ULTRASOUND;  Surgeon: Javier Glazier, MD;  Location: [facility] ENDOSCOPY;  Service: Cardiopulmonary;   Laterality: Bilateral;  . HERNIA REPAIR Right 2005   with mesh  . INSERTION PROSTATE RADIATION SEED  08/2011   prostate cancer, [place]  . IR FLUORO GUIDE PORT INSERTION RIGHT  07/26/2017  . IR US GUIDE VASC ACCESS RIGHT  07/26/2017    REVIEW OF SYSTEMS:   Review of Systems  Constitutional: Negative for appetite change, chills, fatigue, fever and unexpected weight change.  HENT:   Negative for mouth sores, nosebleeds, sore throat and trouble swallowing.   Eyes: Negative for eye problems and icterus.  Respiratory: Negative for cough, hemoptysis, shortness of breath and wheezing.   Cardiovascular: Negative for chest pain and leg swelling.  Gastrointestinal: Negative for abdominal pain, constipation, diarrhea, nausea and vomiting.  Positive for reflux. Genitourinary: Negative for bladder incontinence, difficulty urinating, dysuria, frequency and hematuria.   Musculoskeletal: Negative for back pain, gait problem, neck pain and neck stiffness.  Skin: Negative for itching and rash.  Neurological: Negative for dizziness, extremity weakness, gait problem, headaches, light-headedness and  seizures.  Hematological: Negative for adenopathy. Does not bruise/bleed easily.  Psychiatric/Behavioral: Negative for confusion, depression and sleep disturbance. The patient is not nervous/anxious.     PHYSICAL EXAMINATION:  Blood pressure 116/66, pulse 62, temperature 98 F (36.7 C), temperature source Oral, resp. rate 20, height 5\' 11"  (1.803 m), weight 200 lb 12.8 oz (91.1 kg), SpO2 100 %.  ECOG PERFORMANCE STATUS: 1 - Symptomatic but completely ambulatory  Physical Exam  Constitutional: Oriented to person, place, and time and well-developed, well-nourished, and in no distress. No distress.  HENT:  Head: Normocephalic and atraumatic.  Mouth/Throat: Oropharynx is clear and moist. No oropharyngeal exudate.  Eyes: Conjunctivae are normal. Right eye exhibits no discharge. Left eye exhibits no discharge.  No scleral icterus.  Neck: Normal range of motion. Neck supple.  Cardiovascular: Normal rate, regular rhythm, normal heart sounds and intact distal pulses.   Pulmonary/Chest: Effort normal and breath sounds normal. No respiratory distress. No wheezes. No rales.  Abdominal: Soft. Bowel sounds are normal. Exhibits no distension and no mass. There is no tenderness.  Musculoskeletal: Normal range of motion. Exhibits no edema.  Lymphadenopathy:    No cervical adenopathy.  Neurological: Alert and oriented to person, place, and time. Exhibits normal muscle tone. Gait normal. Coordination normal.  Skin: Skin is warm and dry. No rash noted. Not diaphoretic. No erythema. No pallor.  Psychiatric: Mood, memory and judgment normal.  Vitals reviewed.  LABORATORY DATA: Lab Results  Component Value Date   WBC 4.1 06/26/2018   HGB 8.2 (L) 06/26/2018   HCT 26.4 (L) 06/26/2018   MCV 108.2 (H) 06/26/2018   PLT 315 06/26/2018      Chemistry      Component Value Date/Time   NA 142 06/26/2018 1042   NA 138 07/18/2017 1000   K 4.4 06/26/2018 1042   K 4.0 07/18/2017 1000   CL 107 06/26/2018 1042   CO2 27 06/26/2018 1042   CO2 25 07/18/2017 1000   BUN 18 06/26/2018 1042   BUN 14.9 07/18/2017 1000   CREATININE 0.88 06/26/2018 1042   CREATININE 0.8 07/18/2017 1000      Component Value Date/Time   CALCIUM 9.4 06/26/2018 1042   CALCIUM 9.7 07/18/2017 1000   ALKPHOS 93 06/26/2018 1042   ALKPHOS 93 07/18/2017 1000   AST 18 06/26/2018 1042   AST 22 07/18/2017 1000   ALT 12 06/26/2018 1042   ALT 24 07/18/2017 1000   BILITOT 0.3 06/26/2018 1042   BILITOT 0.54 07/18/2017 1000       RADIOGRAPHIC STUDIES:  No results found.   ASSESSMENT/PLAN:  Cancer of lower lobe of left lung [facility]) This is a very pleasant 67 year old white male with recurrent non-small cell lung cancer initially diagnosed as stage IIIB non-small cell lung cancer favoring adenocarcinoma status post concurrent chemoradiation  followed by consolidation chemotherapy. His last treatment was in September 2017. Restaging scan showed evidence for disease progression and the patient was started on systemic chemotherapy with carboplatin, Alimta and Keytruda for 4 cycles.  His repeat imaging studies showed improvement of his disease.  He is currently undergoing maintenance treatment with Alimta and Keytruda status post 19 cycles.The patient is tolerating his treatment well. We will proceed with cycle #20 today as scheduled. I have discussed with the patient that he is more anemic and he did require a blood transfusion during his recent hospitalization in October 2019.  The Alimta and Keytruda that he is currently on typically does not cause anemia to the  point that transfusions are required.  Review of his medical records show that he has had heme positive stools in the past.  He did undergo a colonoscopy in 2017 and several polyps were removed at that time.  However, there remained a polyp that remained high risk for colon cancer.  A polypectomy was recommended but the patient did not undergo this procedure and did not have further evaluation or work-up due to his diagnosis of lung cancer at that time.  Stool for occult blood was ordered in October 2019, but the patient did not return the stool cards.  I have again ordered stool for occult blood  today.  I stressed the importance of bringing the samples back to [place].  I reviewed the patient's record with Dr. Julien Nordmann who recommends referral back to Dr. Havery Moros to discuss options regarding the polyp that remains.  The patient may require a blood transfusion if hemoglobin is 8.0 or less.  Will hold off on transfusion at this time.  For reflux symptoms, I have given him prescription for omeprazole 20 mg daily.  The patient will have a restaging CT scan of the chest, abdomen, pelvis prior to his next visit.  He will follow-up in 3 weeks for evaluation prior to his next cycle of  treatment.  All questions were answered. The patient knows to call the clinic with any problems, questions or concerns. We can certainly see the patient much sooner if necessary.   Orders Placed This Encounter  Procedures  . CT ABDOMEN PELVIS W CONTRAST    Standing Status:   Future    Standing Expiration Date:   06/27/2019    Order Specific Question:   If indicated for the ordered procedure, I authorize the administration of contrast media per Radiology protocol    Answer:   Yes    Order Specific Question:   Preferred imaging location?    Answer:   [facility]    Order Specific Question:   Radiology Contrast Protocol - do NOT remove file path    Answer:   \\[email or web address]    Order Specific Question:   ** REASON FOR EXAM (FREE TEXT)    Answer:   Lung cancer. Restaging.  . CT CHEST W CONTRAST    Standing Status:   Future    Standing Expiration Date:   06/27/2019    Order Specific Question:   If indicated for the ordered procedure, I authorize the administration of contrast media per Radiology protocol    Answer:   Yes    Order Specific Question:   Preferred imaging location?    Answer:   [facility]    Order Specific Question:   Radiology Contrast Protocol - do NOT remove file path    Answer:   \\[email or web address]    Order Specific Question:   ** REASON FOR EXAM (FREE TEXT)    Answer:   Lung cancer. Restaging.  . Ambulatory referral to Gastroenterology    Referral Priority:   Routine    Referral Type:   Consultation    Referral Reason:   Specialty Services Required    Referred to Provider:   Yetta Flock, MD    Number of Visits Requested:   [place], DNP, AGPCNP-BC, AOCNP 06/26/18

## 2018-06-26 NOTE — Telephone Encounter (Signed)
Patient mychart active, declined calendar and avs.

## 2018-06-26 NOTE — Patient Instructions (Signed)
[facility] Discharge Instructions for Patients Receiving Chemotherapy  Today you received the following chemotherapy agents Pembrolizumab (KEYTRUDA) & Pemetrexed (ALIMTA).  To help prevent nausea and vomiting after your treatment, we encourage you to take your nausea medication as prescribed.  If you develop nausea and vomiting that is not controlled by your nausea medication, call the clinic.   BELOW ARE SYMPTOMS THAT SHOULD BE REPORTED IMMEDIATELY:  *FEVER GREATER THAN 100.5 F  *CHILLS WITH OR WITHOUT FEVER  NAUSEA AND VOMITING THAT IS NOT CONTROLLED WITH YOUR NAUSEA MEDICATION  *UNUSUAL SHORTNESS OF BREATH  *UNUSUAL BRUISING OR BLEEDING  TENDERNESS IN MOUTH AND THROAT WITH OR WITHOUT PRESENCE OF ULCERS  *URINARY PROBLEMS  *BOWEL PROBLEMS  UNUSUAL RASH Items with * indicate a potential emergency and should be followed up as soon as possible.  Feel free to call the clinic should you have any questions or concerns. The clinic phone number is [phone].  Please show the [place] at check-in to the Emergency Department and triage nurse.

## 2018-06-26 NOTE — Patient Instructions (Signed)
Implanted Port Home Guide An implanted port is a type of central line that is placed under the skin. Central lines are used to provide IV access when treatment or nutrition needs to be given through a person's veins. Implanted ports are used for long-term IV access. An implanted port may be placed because:  You need IV medicine that would be irritating to the small veins in your hands or arms.  You need long-term IV medicines, such as antibiotics.  You need IV nutrition for a long period.  You need frequent blood draws for lab tests.  You need dialysis.  Implanted ports are usually placed in the chest area, but they can also be placed in the upper arm, the abdomen, or the leg. An implanted port has two main parts:  Reservoir. The reservoir is round and will appear as a small, raised area under your skin. The reservoir is the part where a needle is inserted to give medicines or draw blood.  Catheter. The catheter is a thin, flexible tube that extends from the reservoir. The catheter is placed into a large vein. Medicine that is inserted into the reservoir goes into the catheter and then into the vein.  How will I care for my incision site? Do not get the incision site wet. Bathe or shower as directed by your health care provider. How is my port accessed? Special steps must be taken to access the port:  Before the port is accessed, a numbing cream can be placed on the skin. This helps numb the skin over the port site.  Your health care provider uses a sterile technique to access the port. ? Your health care provider must put on a mask and sterile gloves. ? The skin over your port is cleaned carefully with an antiseptic and allowed to dry. ? The port is gently pinched between sterile gloves, and a needle is inserted into the port.  Only "non-coring" port needles should be used to access the port. Once the port is accessed, a blood return should be checked. This helps ensure that the port  is in the vein and is not clogged.  If your port needs to remain accessed for a constant infusion, a clear (transparent) bandage will be placed over the needle site. The bandage and needle will need to be changed every week, or as directed by your health care provider.  Keep the bandage covering the needle clean and dry. Do not get it wet. Follow your health care provider's instructions on how to take a shower or bath while the port is accessed.  If your port does not need to stay accessed, no bandage is needed over the port.  What is flushing? Flushing helps keep the port from getting clogged. Follow your health care provider's instructions on how and when to flush the port. Ports are usually flushed with saline solution or a medicine called heparin. The need for flushing will depend on how the port is used.  If the port is used for intermittent medicines or blood draws, the port will need to be flushed: ? After medicines have been given. ? After blood has been drawn. ? As part of routine maintenance.  If a constant infusion is running, the port may not need to be flushed.  How long will my port stay implanted? The port can stay in for as long as your health care provider thinks it is needed. When it is time for the port to come out, surgery will be   done to remove it. The procedure is similar to the one performed when the port was put in. When should I seek immediate medical care? When you have an implanted port, you should seek immediate medical care if:  You notice a bad smell coming from the incision site.  You have swelling, redness, or drainage at the incision site.  You have more swelling or pain at the port site or the surrounding area.  You have a fever that is not controlled with medicine.  This information is not intended to replace advice given to you by your health care provider. Make sure you discuss any questions you have with your health care provider. Document  Released: 07/03/2005 Document Revised: 12/09/2015 Document Reviewed: 03/10/2013 [employer] Interactive Patient Education  2017 [employer]  

## 2018-07-04 ENCOUNTER — Telehealth: Payer: Self-pay | Admitting: Internal Medicine

## 2018-07-04 NOTE — Telephone Encounter (Signed)
LB out 1/2 - moved 1/2 appointments to 1/3 with MM. Spoke with patient wife. Also confirmed ct on 1/2.

## 2018-07-07 ENCOUNTER — Other Ambulatory Visit: Payer: Self-pay | Admitting: Internal Medicine

## 2018-07-07 DIAGNOSIS — Z5112 Encounter for antineoplastic immunotherapy: Secondary | ICD-10-CM

## 2018-07-07 DIAGNOSIS — Z23 Encounter for immunization: Secondary | ICD-10-CM

## 2018-07-07 DIAGNOSIS — C3432 Malignant neoplasm of lower lobe, left bronchus or lung: Secondary | ICD-10-CM

## 2018-07-07 DIAGNOSIS — Z7189 Other specified counseling: Secondary | ICD-10-CM

## 2018-07-07 DIAGNOSIS — R5382 Chronic fatigue, unspecified: Secondary | ICD-10-CM

## 2018-07-18 ENCOUNTER — Ambulatory Visit: Payer: BLUE CROSS/BLUE SHIELD

## 2018-07-18 ENCOUNTER — Ambulatory Visit (HOSPITAL_COMMUNITY)
Admission: RE | Admit: 2018-07-18 | Discharge: 2018-07-18 | Disposition: A | Discharge status: Home / Self Care | Payer: BLUE CROSS/BLUE SHIELD | Source: Ambulatory Visit | Attending: Oncology | Admitting: Oncology

## 2018-07-18 ENCOUNTER — Ambulatory Visit: Payer: BLUE CROSS/BLUE SHIELD | Admitting: Nurse Practitioner

## 2018-07-18 ENCOUNTER — Other Ambulatory Visit: Payer: BLUE CROSS/BLUE SHIELD

## 2018-07-18 DIAGNOSIS — C3432 Malignant neoplasm of lower lobe, left bronchus or lung: Secondary | ICD-10-CM | POA: Insufficient documentation

## 2018-07-18 MED ORDER — IOHEXOL 300 MG/ML  SOLN
100.0000 mL | Freq: Once | INTRAMUSCULAR | Status: AC | PRN
  Administered 2018-07-18: 100 mL via INTRAVENOUS

## 2018-07-18 MED ORDER — SODIUM CHLORIDE (PF) 0.9 % IJ SOLN
INTRAMUSCULAR | Status: AC
  Filled 2018-07-18: qty 50

## 2018-07-18 MED ORDER — HEPARIN SOD (PORK) LOCK FLUSH 100 UNIT/ML IV SOLN
INTRAVENOUS | Status: AC
  Administered 2018-07-18: 500 [IU] via INTRAVENOUS
  Filled 2018-07-18: qty 5

## 2018-07-18 MED ORDER — HEPARIN SOD (PORK) LOCK FLUSH 100 UNIT/ML IV SOLN
500.0000 [IU] | Freq: Once | INTRAVENOUS | Status: AC
  Administered 2018-07-18: 500 [IU] via INTRAVENOUS

## 2018-07-19 ENCOUNTER — Telehealth: Payer: Self-pay

## 2018-07-19 ENCOUNTER — Inpatient Hospital Stay: Payer: BLUE CROSS/BLUE SHIELD

## 2018-07-19 ENCOUNTER — Encounter: Payer: Self-pay | Admitting: Internal Medicine

## 2018-07-19 ENCOUNTER — Other Ambulatory Visit: Payer: Self-pay

## 2018-07-19 ENCOUNTER — Inpatient Hospital Stay: Payer: BLUE CROSS/BLUE SHIELD | Attending: Internal Medicine

## 2018-07-19 ENCOUNTER — Telehealth: Payer: Self-pay | Admitting: Internal Medicine

## 2018-07-19 ENCOUNTER — Inpatient Hospital Stay (HOSPITAL_BASED_OUTPATIENT_CLINIC_OR_DEPARTMENT_OTHER): Payer: BLUE CROSS/BLUE SHIELD | Admitting: Internal Medicine

## 2018-07-19 VITALS — BP 151/81 | HR 58 | Temp 97.9°F | Resp 18 | Ht 71.0 in | Wt 202.5 lb

## 2018-07-19 DIAGNOSIS — R599 Enlarged lymph nodes, unspecified: Secondary | ICD-10-CM

## 2018-07-19 DIAGNOSIS — C3432 Malignant neoplasm of lower lobe, left bronchus or lung: Secondary | ICD-10-CM

## 2018-07-19 DIAGNOSIS — I4892 Unspecified atrial flutter: Secondary | ICD-10-CM | POA: Diagnosis not present

## 2018-07-19 DIAGNOSIS — Z7901 Long term (current) use of anticoagulants: Secondary | ICD-10-CM | POA: Diagnosis not present

## 2018-07-19 DIAGNOSIS — Z5112 Encounter for antineoplastic immunotherapy: Secondary | ICD-10-CM | POA: Diagnosis not present

## 2018-07-19 DIAGNOSIS — R0609 Other forms of dyspnea: Secondary | ICD-10-CM

## 2018-07-19 DIAGNOSIS — I10 Essential (primary) hypertension: Secondary | ICD-10-CM | POA: Insufficient documentation

## 2018-07-19 DIAGNOSIS — D6481 Anemia due to antineoplastic chemotherapy: Secondary | ICD-10-CM

## 2018-07-19 DIAGNOSIS — Z5111 Encounter for antineoplastic chemotherapy: Secondary | ICD-10-CM | POA: Insufficient documentation

## 2018-07-19 DIAGNOSIS — R630 Anorexia: Secondary | ICD-10-CM | POA: Diagnosis not present

## 2018-07-19 DIAGNOSIS — R5383 Other fatigue: Secondary | ICD-10-CM | POA: Diagnosis not present

## 2018-07-19 DIAGNOSIS — Z7189 Other specified counseling: Secondary | ICD-10-CM

## 2018-07-19 DIAGNOSIS — F329 Major depressive disorder, single episode, unspecified: Secondary | ICD-10-CM

## 2018-07-19 DIAGNOSIS — J438 Other emphysema: Secondary | ICD-10-CM

## 2018-07-19 DIAGNOSIS — R5382 Chronic fatigue, unspecified: Secondary | ICD-10-CM

## 2018-07-19 LAB — CBC WITH DIFFERENTIAL (CANCER CENTER ONLY)
Abs Immature Granulocytes: 0.03 10*3/uL (ref 0.00–0.07)
Basophils Absolute: 0 10*3/uL (ref 0.0–0.1)
Basophils Relative: 0 %
Eosinophils Absolute: 0 10*3/uL (ref 0.0–0.5)
Eosinophils Relative: 0 %
HCT: 28 % — ABNORMAL LOW (ref 39.0–52.0)
Hemoglobin: 8.5 g/dL — ABNORMAL LOW (ref 13.0–17.0)
Immature Granulocytes: 1 %
Lymphocytes Relative: 14 %
Lymphs Abs: 0.8 10*3/uL (ref 0.7–4.0)
MCH: 32.6 pg (ref 26.0–34.0)
MCHC: 30.4 g/dL (ref 30.0–36.0)
MCV: 107.3 fL — ABNORMAL HIGH (ref 80.0–100.0)
Monocytes Absolute: 0.4 10*3/uL (ref 0.1–1.0)
Monocytes Relative: 7 %
Neutro Abs: 4.1 10*3/uL (ref 1.7–7.7)
Neutrophils Relative %: 78 %
Platelet Count: 275 10*3/uL (ref 150–400)
RBC: 2.61 MIL/uL — ABNORMAL LOW (ref 4.22–5.81)
RDW: 16.1 % — ABNORMAL HIGH (ref 11.5–15.5)
WBC Count: 5.2 10*3/uL (ref 4.0–10.5)
nRBC: 0 % (ref 0.0–0.2)

## 2018-07-19 LAB — CMP (CANCER CENTER ONLY)
ALT: 7 U/L (ref 0–44)
AST: 14 U/L — ABNORMAL LOW (ref 15–41)
Albumin: 3.2 g/dL — ABNORMAL LOW (ref 3.5–5.0)
Alkaline Phosphatase: 93 U/L (ref 38–126)
Anion gap: 9 (ref 5–15)
BUN: 10 mg/dL (ref 8–23)
CO2: 27 mmol/L (ref 22–32)
Calcium: 9.5 mg/dL (ref 8.9–10.3)
Chloride: 106 mmol/L (ref 98–111)
Creatinine: 0.73 mg/dL (ref 0.61–1.24)
GFR, Est AFR Am: >60 mL/min (ref >60)
GFR, Estimated: >60 mL/min (ref >60)
Glucose, Bld: 142 mg/dL — ABNORMAL HIGH (ref 70–99)
Potassium: 4.2 mmol/L (ref 3.5–5.1)
Sodium: 142 mmol/L (ref 135–145)
Total Bilirubin: 0.6 mg/dL (ref 0.3–1.2)
Total Protein: 6.6 g/dL (ref 6.5–8.1)

## 2018-07-19 LAB — PREPARE RBC (CROSSMATCH)

## 2018-07-19 MED ORDER — SODIUM CHLORIDE 0.9% IV SOLUTION
250.0000 mL | Freq: Once | INTRAVENOUS | Status: AC
  Administered 2018-07-19: 250 mL via INTRAVENOUS
  Filled 2018-07-19: qty 250

## 2018-07-19 MED ORDER — SODIUM CHLORIDE 0.9% FLUSH
10.0000 mL | INTRAVENOUS | Status: AC | PRN
  Administered 2018-07-19: 10 mL
  Filled 2018-07-19: qty 10

## 2018-07-19 MED ORDER — HEPARIN SOD (PORK) LOCK FLUSH 100 UNIT/ML IV SOLN
500.0000 [IU] | Freq: Every day | INTRAVENOUS | Status: AC | PRN
  Administered 2018-07-19: 500 [IU]
  Filled 2018-07-19: qty 5

## 2018-07-19 MED ORDER — ACETAMINOPHEN 325 MG PO TABS
ORAL_TABLET | ORAL | Status: AC
  Filled 2018-07-19: qty 2

## 2018-07-19 MED ORDER — ACETAMINOPHEN 325 MG PO TABS
650.0000 mg | ORAL_TABLET | Freq: Once | ORAL | Status: AC
  Administered 2018-07-19: 650 mg via ORAL

## 2018-07-19 MED ORDER — DIPHENHYDRAMINE HCL 25 MG PO CAPS
ORAL_CAPSULE | ORAL | Status: AC
  Filled 2018-07-19: qty 1

## 2018-07-19 MED ORDER — DIPHENHYDRAMINE HCL 25 MG PO CAPS
25.0000 mg | ORAL_CAPSULE | Freq: Once | ORAL | Status: AC
  Administered 2018-07-19: 25 mg via ORAL

## 2018-07-19 MED ORDER — SODIUM CHLORIDE 0.9% FLUSH
10.0000 mL | INTRAVENOUS | Status: DC | PRN
  Administered 2018-07-19: 10 mL via INTRAVENOUS
  Filled 2018-07-19: qty 10

## 2018-07-19 MED ORDER — DEXAMETHASONE 4 MG PO TABS: ORAL_TABLET | ORAL | 2 refills | Status: AC

## 2018-07-19 NOTE — Telephone Encounter (Signed)
Spoke with infusion charge re blood today - blood will be added to tx.

## 2018-07-19 NOTE — Progress Notes (Signed)
Per Dr Julien Nordmann , pt is only getting a total of one unit of blood.

## 2018-07-19 NOTE — Patient Instructions (Signed)
Blood Transfusion, Adult, Care After This sheet gives you information about how to care for yourself after your procedure. Your doctor may also give you more specific instructions. If you have problems or questions, contact your doctor. Follow these instructions at home:   Take over-the-counter and prescription medicines only as told by your doctor.  Go back to your normal activities as told by your doctor.  Follow instructions from your doctor about how to take care of the area where an IV tube was put into your vein (insertion site). Make sure you: ? Wash your hands with soap and water before you change your bandage (dressing). If there is no soap and water, use hand sanitizer. ? Change your bandage as told by your doctor.  Check your IV insertion site every day for signs of infection. Check for: ? More redness, swelling, or pain. ? More fluid or blood. ? Warmth. ? Pus or a bad smell. Contact a doctor if:  You have more redness, swelling, or pain around the IV insertion site.  You have more fluid or blood coming from the IV insertion site.  Your IV insertion site feels warm to the touch.  You have pus or a bad smell coming from the IV insertion site.  Your pee (urine) turns pink, red, or brown.  You feel weak after doing your normal activities. Get help right away if:  You have signs of a serious allergic or body defense (immune) system reaction, including: ? Itchiness. ? Hives. ? Trouble breathing. ? Anxiety. ? Pain in your chest or lower back. ? Fever, flushing, and chills. ? Fast pulse. ? Rash. ? Watery poop (diarrhea). ? Throwing up (vomiting). ? Dark pee. ? Serious headache. ? Dizziness. ? Stiff neck. ? Yellow color in your face or the white parts of your eyes (jaundice). Summary  After a blood transfusion, return to your normal activities as told by your doctor.  Every day, check for signs of infection where the IV tube was put into your vein.  Some  signs of infection are warm skin, more redness and pain, more fluid or blood, and pus or a bad smell where the needle went in.  Contact your doctor if you feel weak or have any unusual symptoms. This information is not intended to replace advice given to you by your health care provider. Make sure you discuss any questions you have with your health care provider. Document Released: 07/24/2014 Document Revised: 02/25/2016 Document Reviewed: 02/25/2016 Elsevier Interactive Patient Education  2019 [employer]  

## 2018-07-19 NOTE — Telephone Encounter (Signed)
Printed avs and calender of upcoming appointment. Per 1/3 os

## 2018-07-19 NOTE — Progress Notes (Signed)
[place] [phone]) [phone]   [phone]  OFFICE PROGRESS NOTE  [name], MD [place] [address]  DIAGNOSIS: recurrent non-small cell lung cancer initially diagnosed as Stage IIIB (T1b, N3, M0) non-small cell lung cancer favoring adenocarcinoma presented with left lower lobe pulmonary nodule in addition to left hilar and bilateral mediastinal lymphadenopathy in addition to right supraclavicular lymph nodes diagnosed in April 2017.  PRIOR THERAPY: 1) Concurrent chemoradiation with weekly carboplatin for AUC of 2 and paclitaxel 45 mg/M2 started on 11/15/2015. He is status post 6 cycles. Last dose was getting 12/20/2015 with partial response. 2) Consolidation chemotherapy with carboplatin for AUC of 5 and paclitaxel 175 MG/M2 every 3 weeks with Neulasta support. Status post 3 cycles. Last dose was given 04/03/2016. 3) systemic chemotherapy with carboplatin for AUC of 5, Alimta 500 MG/M2 and Ketruda 200 MG IV every 3 weeks. First dose 05/16/2017.  Status post 20 cycle.  Starting from cycle #5 the patient is treated with maintenance Alimta and Keytruda only.  Last dose was given June 26, 2018. It was discontinued today secondary to disease progression.  CURRENT THERAPY: Systemic chemotherapy with docetaxel 75 mg/M2 and Cyramza 10 mg/KG every 3 weeks.  First dose July 24, 2018.  INTERVAL HISTORY: Brian White 68 y.o. male returns to the clinic today for follow-up visit accompanied by his wife.  The patient is feeling fine today with no concerning complaints except for increasing fatigue as well as shortness of breath with exertion.  He denied having any chest pain, cough or hemoptysis.  He denied having any recent weight loss or night sweats.  He has no nausea, vomiting, diarrhea or constipation.  He denied having any headache or visual changes.  He has been tolerating his treatment with Alimta and Keytruda fairly well.  The  patient had repeat CT scan of the chest, abdomen and pelvis performed recently and he is here for evaluation and discussion of his scan results.   MEDICAL HISTORY: Past Medical History:  Diagnosis Date  . Adenocarcinoma, lung ([place]) 10/08/2015   2.4cm spiculated LLL nodule suspicious for primary bronchogenic carcinoma by CT 09/2015 - adenocarcinoma by endobronchial biopsy  . Allergy   . Arthritis   . CAD (coronary artery disease)    mild MI; pt unaware - seen on stress test  . COPD (chronic obstructive pulmonary disease) ([place]) 06/26/2011   Mild centrilobular and paraseptal emphysema by CT 09/2015   . ED (erectile dysfunction)   . H/O anaphylactic shock 2013   hives,hypotension,syncope  . H/O seasonal allergies   . Herpes zoster 12/06/2015  . Hypertension   . Polycythemia 01/25/2014  . Prostate cancer [facility]) 2013   s/p radiation therapy March 2013, planned f/u with uro after XRT  . Radiation 09/18/2011 through 11/10/2011   Prostate 7600 cGy 40 sessions, seminal vesicles 5600 cGy 40 sessions    . Radiation 11/10/15-12/22/15   chest 60 Gy  . Shortness of breath dyspnea   . Smoker   . Thoracic aorta atherosclerosis ([place]) 10/08/2015   Mild by CT 09/2015     ALLERGIES:  is allergic to onion.  MEDICATIONS:  Current Outpatient Medications  Medication Sig Dispense Refill  . acetaminophen (TYLENOL) 500 MG tablet Take 1,000 mg by mouth every 6 (six) hours as needed (for pain.).    Mar[place] Kitchen apixaban (ELIQUIS) 5 MG TABS tablet Take 1 tablet (5 mg total) by mouth 2 (two) times daily. 60 tablet 5  .  budesonide-formoterol (SYMBICORT) 160-4.5 MCG/ACT inhaler Inhale 1 puff into the lungs 2 (two) times daily. 3 Inhaler 3  . carvedilol (COREG) 3.125 MG tablet Take 1 tablet (3.125 mg total) by mouth 2 (two) times daily with a meal. 60 tablet 5  . cetirizine (ZYRTEC) 10 MG chewable tablet Chew 10 mg by mouth daily as needed (for seasonal allergies).     Marland Kitchen dexamethasone (DECADRON) 4 MG tablet  TAKE 1 TABLET BY MOUTH TWICE DAILY THE DAY BEFORE, DAY OF AND AFTER THE CHEMOTHERAPY EVERY 3 WEEKS 40 tablet 0  . folic acid (FOLVITE) 1 MG tablet TAKE 1 TABLET(1 MG) BY MOUTH DAILY 30 tablet 0  . furosemide (LASIX) 20 MG tablet Take 1 tablet (20 mg total) by mouth daily as needed for fluid or edema. 30 tablet 5  . guaiFENesin (MUCINEX) 600 MG 12 hr tablet Take 600 mg by mouth 2 (two) times daily as needed (for cough/congestion).     Marland Kitchen ibuprofen (ADVIL,MOTRIN) 200 MG tablet Take 800 mg by mouth every 8 (eight) hours as needed (for pain.).    Marland Kitchen lidocaine-prilocaine (EMLA) cream Apply 1 application topically as needed. Squeeze a  small amount of cream to cotton ball and apply to port site 1-2 hours prior to chemotherapy. Do not rub in the cream and cover  with plastic wrap. (Patient taking differently: Apply 1 application topically daily as needed (prior to port being accessed for chemo.). Squeeze a  small amount of cream to cotton ball and apply to port site 1-2 hours prior to chemotherapy. Do not rub in the cream and cover  with plastic wrap.) 30 g 0  . losartan (COZAAR) 25 MG tablet Take 1 tablet (25 mg total) by mouth daily. 30 tablet 5  . mirtazapine (REMERON) 15 MG tablet TAKE 1 TABLET(15 MG) BY MOUTH AT BEDTIME (Patient taking differently: Take 15 mg by mouth at bedtime as needed (mood/sleep.). ) 30 tablet 0  . omeprazole (PRILOSEC) 20 MG capsule Take 1 capsule (20 mg total) by mouth daily. 30 capsule 1  . spironolactone (ALDACTONE) 25 MG tablet Take 0.5 tablets (12.5 mg total) by mouth daily. (Patient not taking: Reported on 07/19/2018) 30 tablet 0   No current facility-administered medications for this visit.     SURGICAL HISTORY:  Past Surgical History:  Procedure Laterality Date  . COLONOSCOPY W/ BIOPSIES AND POLYPECTOMY  10/11/15   mult TAs, 3cm rectal polypoid lesion; rec further treatment, diverticulosis ([place])  . ENDOBRONCHIAL ULTRASOUND Bilateral 10/18/2015   Procedure:  ENDOBRONCHIAL ULTRASOUND;  Surgeon: Javier Glazier, MD;  Location: [facility];  Service: Cardiopulmonary;  Laterality: Bilateral;  . HERNIA REPAIR Right 2005   with mesh  . INSERTION PROSTATE RADIATION SEED  08/2011   prostate cancer, [facility]  . IR FLUORO GUIDE PORT INSERTION RIGHT  07/26/2017  . IR US GUIDE VASC ACCESS RIGHT  07/26/2017    REVIEW OF SYSTEMS:  Constitutional: positive for fatigue Eyes: negative Ears, nose, mouth, throat, and face: negative Respiratory: positive for dyspnea on exertion Cardiovascular: negative Gastrointestinal: negative Genitourinary:negative Integument/breast: negative Hematologic/lymphatic: negative Musculoskeletal:positive for arthralgias Neurological: negative Behavioral/Psych: negative Endocrine: negative Allergic/Immunologic: negative   PHYSICAL EXAMINATION: General appearance: alert, cooperative, fatigued and no distress Head: Normocephalic, without obvious abnormality, atraumatic Neck: no adenopathy, no JVD, supple, symmetrical, trachea midline and thyroid not enlarged, symmetric, no tenderness/mass/nodules Lymph nodes: Cervical, supraclavicular, and axillary nodes normal. Resp: clear to auscultation bilaterally Back: symmetric, no curvature. ROM normal. No CVA tenderness. Cardio: regular rate and rhythm, S1, S2  normal, no murmur, click, rub or gallop GI: soft, non-tender; bowel sounds normal; no masses,  no organomegaly Extremities: extremities normal, atraumatic, no cyanosis or edema Neurologic: Alert and oriented X 3, normal strength and tone. Normal symmetric reflexes. Normal coordination and gait  ECOG PERFORMANCE STATUS: 1 - Symptomatic but completely ambulatory  Blood pressure (!) 151/81, pulse (!) 58, temperature 97.9 F (36.6 C), temperature source Oral, resp. rate 18, height 5\' 11"  (1.803 m), weight 202 lb 8 oz (91.9 kg), SpO2 100 %.  LABORATORY DATA: Lab Results  Component Value Date   WBC 5.2 07/19/2018   HGB 8.5  (L) 07/19/2018   HCT 28.0 (L) 07/19/2018   MCV 107.3 (H) 07/19/2018   PLT 275 07/19/2018      Chemistry      Component Value Date/Time   NA 142 07/19/2018 0930   NA 138 07/18/2017 1000   K 4.2 07/19/2018 0930   K 4.0 07/18/2017 1000   CL 106 07/19/2018 0930   CO2 27 07/19/2018 0930   CO2 25 07/18/2017 1000   BUN 10 07/19/2018 0930   BUN 14.9 07/18/2017 1000   CREATININE 0.73 07/19/2018 0930   CREATININE 0.8 07/18/2017 1000      Component Value Date/Time   CALCIUM 9.5 07/19/2018 0930   CALCIUM 9.7 07/18/2017 1000   ALKPHOS 93 07/19/2018 0930   ALKPHOS 93 07/18/2017 1000   AST 14 (L) 07/19/2018 0930   AST 22 07/18/2017 1000   ALT 7 07/19/2018 0930   ALT 24 07/18/2017 1000   BILITOT 0.6 07/19/2018 0930   BILITOT 0.54 07/18/2017 1000       RADIOGRAPHIC STUDIES: Ct Chest W Contrast  Result Date: 07/18/2018 CLINICAL DATA:  Patient with history of non-small cell lung cancer status post chemotherapy and radiation. EXAM: CT CHEST, ABDOMEN, AND PELVIS WITH CONTRAST TECHNIQUE: Multidetector CT imaging of the chest, abdomen and pelvis was performed following the standard protocol during bolus administration of intravenous contrast. CONTRAST:  15mL OMNIPAQUE IOHEXOL 300 MG/ML  SOLN COMPARISON:  CT CAP 04/22/2018 FINDINGS: CT CHEST FINDINGS Cardiovascular: Heart is enlarged. New small pericardial effusion. Coronary arterial vascular calcifications. Thoracic aortic vascular calcifications. Mediastinum/Nodes: Interval increase in size of soft tissue within the AP window measuring 3.5 x 3.9 cm (image 23; series 2), previously 2.9 x 2.4 cm. Increase in left infrahilar adenopathy measuring 1.5 cm (image 34; series 2), previously 0.7 cm. Increased left infrahilar and left hilar soft tissue fullness. Additionally, there is new vague soft tissue fullness within the right hilum (image 21; series 2). Lungs/Pleura: Marked narrowing of the left mainstem bronchus (image 63; series 4). Additionally there  is narrowing of the lingular and left lower lobe bronchi. Interval increase in nodularity within the medial left lower lobe at the site of associated scarring with nodularity measuring up to 1.1 cm (image 90; series 4), previously 0.7 cm. Unchanged 6 mm low-attenuation nodule left upper lobe (image 33; series 4). New small left layering pleural effusion. No pneumothorax. Patchy ground-glass and consolidative opacities within the medial right upper lobe (image 54; series 4), new from prior. Musculoskeletal: Thoracic spine degenerative changes. No aggressive or acute appearing osseous lesions. CT ABDOMEN PELVIS FINDINGS Hepatobiliary: The liver is normal in size and contour. Fatty deposition adjacent to the falciform ligament. Gallbladder is decompressed. No intrahepatic or extrahepatic biliary ductal dilatation. Pancreas: Unremarkable Spleen: Unremarkable Adrenals/Urinary Tract: Normal adrenal glands. Bilateral perinephric fat stranding. Urinary bladder wall thickening. No after fro CIS. Stomach/Bowel: No abnormal bowel wall thickening or evidence  for bowel obstruction. No free fluid or free intraperitoneal air. No are of the stomach. Vascular/Lymphatic: Normal caliber abdominal aorta. Interval increase in size of aortocaval lymph node measuring 2.4 x 1.8 cm (image 70; series 2), previously 2.2 x 1.6 cm. 1.4 cm preaortic lymph node (image 75; series 2), unchanged from prior. Interval increase in size of gastrohepatic node measuring 2.0 cm (image 55; series 2), previously 1.4 cm. Reproductive: Heterogeneous enlarged prostate. Other: Small bilateral fat containing inguinal hernias. Musculoskeletal: Lumbar spine degenerative changes. No aggressive or acute appearing osseous lesions. IMPRESSION: 1. Interval increase in size of nodal mass within the AP window as well as soft tissue extending into the left hilum. There is associated narrowing of the left lung bronchi. 2. Increased nodularity within the left lower lobe  which may represent disease progression. New small left pleural effusion. 3. Interval increase in size of a few of the retroperitoneal lymph nodes and gastrohepatic node within the abdomen. 4. New ground-glass and consolidative opacities within the medial right upper lobe which may be infectious/inflammatory in etiology. Recommend attention on follow-up. Electronically Signed   By: Lovey Newcomer M.D.   On: 07/18/2018 13:05   Ct Abdomen Pelvis W Contrast  Result Date: 07/18/2018 CLINICAL DATA:  Patient with history of non-small cell lung cancer status post chemotherapy and radiation. EXAM: CT CHEST, ABDOMEN, AND PELVIS WITH CONTRAST TECHNIQUE: Multidetector CT imaging of the chest, abdomen and pelvis was performed following the standard protocol during bolus administration of intravenous contrast. CONTRAST:  111mL OMNIPAQUE IOHEXOL 300 MG/ML  SOLN COMPARISON:  CT CAP 04/22/2018 FINDINGS: CT CHEST FINDINGS Cardiovascular: Heart is enlarged. New small pericardial effusion. Coronary arterial vascular calcifications. Thoracic aortic vascular calcifications. Mediastinum/Nodes: Interval increase in size of soft tissue within the AP window measuring 3.5 x 3.9 cm (image 23; series 2), previously 2.9 x 2.4 cm. Increase in left infrahilar adenopathy measuring 1.5 cm (image 34; series 2), previously 0.7 cm. Increased left infrahilar and left hilar soft tissue fullness. Additionally, there is new vague soft tissue fullness within the right hilum (image 21; series 2). Lungs/Pleura: Marked narrowing of the left mainstem bronchus (image 63; series 4). Additionally there is narrowing of the lingular and left lower lobe bronchi. Interval increase in nodularity within the medial left lower lobe at the site of associated scarring with nodularity measuring up to 1.1 cm (image 90; series 4), previously 0.7 cm. Unchanged 6 mm low-attenuation nodule left upper lobe (image 33; series 4). New small left layering pleural effusion. No  pneumothorax. Patchy ground-glass and consolidative opacities within the medial right upper lobe (image 54; series 4), new from prior. Musculoskeletal: Thoracic spine degenerative changes. No aggressive or acute appearing osseous lesions. CT ABDOMEN PELVIS FINDINGS Hepatobiliary: The liver is normal in size and contour. Fatty deposition adjacent to the falciform ligament. Gallbladder is decompressed. No intrahepatic or extrahepatic biliary ductal dilatation. Pancreas: Unremarkable Spleen: Unremarkable Adrenals/Urinary Tract: Normal adrenal glands. Bilateral perinephric fat stranding. Urinary bladder wall thickening. No after fro CIS. Stomach/Bowel: No abnormal bowel wall thickening or evidence for bowel obstruction. No free fluid or free intraperitoneal air. No are of the stomach. Vascular/Lymphatic: Normal caliber abdominal aorta. Interval increase in size of aortocaval lymph node measuring 2.4 x 1.8 cm (image 70; series 2), previously 2.2 x 1.6 cm. 1.4 cm preaortic lymph node (image 75; series 2), unchanged from prior. Interval increase in size of gastrohepatic node measuring 2.0 cm (image 55; series 2), previously 1.4 cm. Reproductive: Heterogeneous enlarged prostate. Other: Small bilateral fat  containing inguinal hernias. Musculoskeletal: Lumbar spine degenerative changes. No aggressive or acute appearing osseous lesions. IMPRESSION: 1. Interval increase in size of nodal mass within the AP window as well as soft tissue extending into the left hilum. There is associated narrowing of the left lung bronchi. 2. Increased nodularity within the left lower lobe which may represent disease progression. New small left pleural effusion. 3. Interval increase in size of a few of the retroperitoneal lymph nodes and gastrohepatic node within the abdomen. 4. New ground-glass and consolidative opacities within the medial right upper lobe which may be infectious/inflammatory in etiology. Recommend attention on follow-up.  Electronically Signed   By: Lovey Newcomer M.D.   On: 07/18/2018 13:05    ASSESSMENT AND PLAN:  This is a very pleasant 68 years old white male with recurrent non-small cell lung cancer initially diagnosed as stage IIIB non-small cell lung cancer favoring adenocarcinoma status post concurrent chemoradiation followed by consolidation chemotherapy. His last treatment was in September 2017. Restaging scan showed evidence for disease progression and the patient was started on systemic chemotherapy with carboplatin, Alimta and Keytruda for 4 cycles.  His repeat imaging studies showed improvement of his disease.  He is currently undergoing maintenance treatment with Alimta and Keytruda status post 20 cycles. The patient has been tolerating this treatment well with no concerning adverse effect except for fatigue. He had repeat CT scan of the chest, abdomen and pelvis performed recently.  I personally and independently reviewed the scan images and discussed the result and showed the images to the patient and his wife. Unfortunately his a scan showed evidence for disease progression involving mediastinal lymph nodes as well as nodularity in the left lower lobe as progression of retroperitoneal lymph nodes. I recommended for the patient to discontinue his current treatment with Alimta and Keytruda at this point. I discussed with the patient other treatment options including palliative care versus palliative systemic chemotherapy with second line treatment with docetaxel 75 mg/M2 and Cyramza 10 mg/KG every 3 weeks with Neulasta support.  The patient is interested in proceeding with systemic chemotherapy. For the history of atrial flutter, the patient will continue his current treatment with Eliquis. For hypertension he was advised to take his blood pressure medication as prescribed and to consult with his primary care physician and cardiologist for adjustment of his medication if needed. For the lack of appetite and  depression, he will continue his current treatment with Remeron nightly. The patient is expected to start the first cycle of his chemotherapy next week and he will come back for follow-up visit in 4 weeks for evaluation before the next cycle of his treatment. He was advised to call immediately if he has any concerning symptoms in the interval. I discussed with him the adverse effect of this treatment including but not limited to alopecia, myelosuppression, nausea and vomiting, peripheral neuropathy, liver or renal dysfunction as well as nail changes. He is expected to start the first cycle of this treatment next week. For the chemotherapy-induced anemia, I will arrange for the patient to receive 1 unit of PRBCs transfusion today. The patient voices understanding of current disease status and treatment options and is in agreement with the current care plan. All questions were answered. The patient knows to call the clinic with any problems, questions or concerns. We can certainly see the patient much sooner if necessary.  Disclaimer: This note was dictated with voice recognition software. Similar sounding words can inadvertently be transcribed and may not be corrected  upon review.

## 2018-07-19 NOTE — Progress Notes (Signed)
DISCONTINUE ON PATHWAY REGIMEN - Non-Small Cell Lung     A cycle is every 21 days:     Pembrolizumab      Pemetrexed      Carboplatin   **Always confirm dose/schedule in your pharmacy ordering system**  REASON: Disease Progression PRIOR TREATMENT: [id number]: Pembrolizumab 200 mg + Pemetrexed 500 mg/m2 + Carboplatin AUC=5 q21 Days x 4 Cycles TREATMENT RESPONSE: Progressive Disease (PD)  START ON PATHWAY REGIMEN - Non-Small Cell Lung     A cycle is every 21 days:     Ramucirumab      Docetaxel   **Always confirm dose/schedule in your pharmacy ordering system**  Patient Characteristics: Stage IV Metastatic, Nonsquamous, Second Line - Chemotherapy/Immunotherapy, PS = 0, 1, No Prior PD-1/PD-L1  Inhibitor or Prior PD-1/PD-L1 Inhibitor + Chemotherapy, and Not a Candidate for Immunotherapy AJCC T Category: T1b Current Disease Status: Distant Metastases AJCC N Category: N3 AJCC M Category: M1a AJCC 8 Stage Grouping: IVA Histology: Nonsquamous Cell ROS1 Rearrangement Status: Quantity Not Sufficient T790M Mutation Status: Not Applicable - EGFR Mutation Negative/Unknown Other Mutations/Biomarkers: No Other Actionable Mutations NTRK Gene Fusion Status: Negative PD-L1 Expression Status: Quantity Not Sufficient Chemotherapy/Immunotherapy LOT: Second Line Chemotherapy/Immunotherapy Molecular Targeted Therapy: Not Appropriate ALK Translocation Status: Quantity Not Sufficient EGFR Mutation Status: Quantity Not Sufficient BRAF V600E Mutation Status: Quantity Not Sufficient Performance Status: PS = 0, 1 Immunotherapy Candidate Status: Not a Candidate for Immunotherapy Prior Immunotherapy Status: Prior PD-1/PD-L1 Inhibitor + Chemotherapy Intent of Therapy: Non-Curative / Palliative Intent, Discussed with Patient

## 2018-07-20 ENCOUNTER — Encounter: Payer: Self-pay | Admitting: Internal Medicine

## 2018-07-23 LAB — BPAM RBC
Blood Product Expiration Date: 202001292359
Blood Product Expiration Date: 202001302359
ISSUE DATE / TIME: 202001031253
ISSUE DATE / TIME: 202001031253
Unit Type and Rh: 5100
Unit Type and Rh: 5100

## 2018-07-23 LAB — TYPE AND SCREEN
ABO/RH(D): O POS
Antibody Screen: NEGATIVE
Unit division: 0
Unit division: 0

## 2018-07-24 ENCOUNTER — Telehealth: Payer: Self-pay

## 2018-07-24 ENCOUNTER — Inpatient Hospital Stay: Payer: BLUE CROSS/BLUE SHIELD

## 2018-07-24 NOTE — Telephone Encounter (Signed)
Per 1/3 added to the book for 1/8 cryma/taxotere. Approved and added

## 2018-07-25 ENCOUNTER — Other Ambulatory Visit: Payer: Self-pay | Admitting: Medical Oncology

## 2018-07-25 ENCOUNTER — Inpatient Hospital Stay: Payer: BLUE CROSS/BLUE SHIELD

## 2018-07-25 ENCOUNTER — Inpatient Hospital Stay (HOSPITAL_BASED_OUTPATIENT_CLINIC_OR_DEPARTMENT_OTHER): Payer: BLUE CROSS/BLUE SHIELD | Admitting: Medical

## 2018-07-25 ENCOUNTER — Ambulatory Visit: Payer: BLUE CROSS/BLUE SHIELD | Admitting: Cardiology

## 2018-07-25 VITALS — BP 128/65 | HR 67 | Temp 98.5°F | Resp 18

## 2018-07-25 DIAGNOSIS — C3432 Malignant neoplasm of lower lobe, left bronchus or lung: Secondary | ICD-10-CM

## 2018-07-25 DIAGNOSIS — Z5111 Encounter for antineoplastic chemotherapy: Secondary | ICD-10-CM

## 2018-07-25 DIAGNOSIS — T8090XA Unspecified complication following infusion and therapeutic injection, initial encounter: Secondary | ICD-10-CM | POA: Diagnosis not present

## 2018-07-25 DIAGNOSIS — Z5112 Encounter for antineoplastic immunotherapy: Secondary | ICD-10-CM | POA: Diagnosis not present

## 2018-07-25 LAB — CMP (CANCER CENTER ONLY)
ALT: 10 U/L (ref 0–44)
AST: 13 U/L — ABNORMAL LOW (ref 15–41)
Albumin: 3.4 g/dL — ABNORMAL LOW (ref 3.5–5.0)
Alkaline Phosphatase: 90 U/L (ref 38–126)
Anion gap: 11 (ref 5–15)
BUN: 13 mg/dL (ref 8–23)
CO2: 26 mmol/L (ref 22–32)
Calcium: 9.2 mg/dL (ref 8.9–10.3)
Chloride: 104 mmol/L (ref 98–111)
Creatinine: 0.75 mg/dL (ref 0.61–1.24)
GFR, Est AFR Am: >60 mL/min (ref >60)
GFR, Estimated: >60 mL/min (ref >60)
Glucose, Bld: 146 mg/dL — ABNORMAL HIGH (ref 70–99)
Potassium: 4 mmol/L (ref 3.5–5.1)
Sodium: 141 mmol/L (ref 135–145)
Total Bilirubin: 0.4 mg/dL (ref 0.3–1.2)
Total Protein: 7 g/dL (ref 6.5–8.1)

## 2018-07-25 LAB — CBC WITH DIFFERENTIAL (CANCER CENTER ONLY)
Abs Immature Granulocytes: 0.06 10*3/uL (ref 0.00–0.07)
Basophils Absolute: 0 10*3/uL (ref 0.0–0.1)
Basophils Relative: 0 %
Eosinophils Absolute: 0 10*3/uL (ref 0.0–0.5)
Eosinophils Relative: 0 %
HCT: 32.5 % — ABNORMAL LOW (ref 39.0–52.0)
Hemoglobin: 10.3 g/dL — ABNORMAL LOW (ref 13.0–17.0)
Immature Granulocytes: 1 %
Lymphocytes Relative: 11 %
Lymphs Abs: 0.7 10*3/uL (ref 0.7–4.0)
MCH: 32.7 pg (ref 26.0–34.0)
MCHC: 31.7 g/dL (ref 30.0–36.0)
MCV: 103.2 fL — ABNORMAL HIGH (ref 80.0–100.0)
Monocytes Absolute: 0.3 10*3/uL (ref 0.1–1.0)
Monocytes Relative: 4 %
Neutro Abs: 5.9 10*3/uL (ref 1.7–7.7)
Neutrophils Relative %: 84 %
Platelet Count: 210 10*3/uL (ref 150–400)
RBC: 3.15 MIL/uL — ABNORMAL LOW (ref 4.22–5.81)
RDW: 16 % — ABNORMAL HIGH (ref 11.5–15.5)
WBC Count: 7 10*3/uL (ref 4.0–10.5)
nRBC: 0 % (ref 0.0–0.2)

## 2018-07-25 LAB — TOTAL PROTEIN, URINE DIPSTICK: Protein, ur: NEGATIVE mg/dL

## 2018-07-25 MED ORDER — METHYLPREDNISOLONE 4 MG PO TBPK: ORAL_TABLET | ORAL | 0 refills | Status: DC

## 2018-07-25 MED ORDER — ACETAMINOPHEN 325 MG PO TABS
650.0000 mg | ORAL_TABLET | Freq: Once | ORAL | Status: AC
  Administered 2018-07-25: 650 mg via ORAL

## 2018-07-25 MED ORDER — SODIUM CHLORIDE 0.9 % IV SOLN
Freq: Once | INTRAVENOUS | Status: AC
  Administered 2018-07-25: 09:00:00 via INTRAVENOUS
  Filled 2018-07-25: qty 250

## 2018-07-25 MED ORDER — SODIUM CHLORIDE 0.9% FLUSH
10.0000 mL | INTRAVENOUS | Status: DC | PRN
  Administered 2018-07-25: 10 mL
  Filled 2018-07-25: qty 10

## 2018-07-25 MED ORDER — FAMOTIDINE IN NACL 20-0.9 MG/50ML-% IV SOLN
INTRAVENOUS | Status: AC
  Filled 2018-07-25: qty 50

## 2018-07-25 MED ORDER — SODIUM CHLORIDE 0.9 % IV SOLN: 10.0000 mg | Freq: Once | INTRAVENOUS | Status: DC

## 2018-07-25 MED ORDER — DEXAMETHASONE SODIUM PHOSPHATE 10 MG/ML IJ SOLN
10.0000 mg | Freq: Once | INTRAMUSCULAR | Status: AC
  Administered 2018-07-25: 10 mg via INTRAVENOUS

## 2018-07-25 MED ORDER — SODIUM CHLORIDE 0.9 % IV SOLN
10.0000 mg/kg | Freq: Once | INTRAVENOUS | Status: AC
  Administered 2018-07-25: 900 mg via INTRAVENOUS
  Filled 2018-07-25: qty 40

## 2018-07-25 MED ORDER — PROCHLORPERAZINE MALEATE 10 MG PO TABS: 10.0000 mg | ORAL_TABLET | Freq: Four times a day (QID) | ORAL | 0 refills | Status: DC | PRN

## 2018-07-25 MED ORDER — FAMOTIDINE IN NACL 20-0.9 MG/50ML-% IV SOLN
20.0000 mg | Freq: Once | INTRAVENOUS | Status: AC | PRN
  Administered 2018-07-25: 20 mg via INTRAVENOUS

## 2018-07-25 MED ORDER — DIPHENHYDRAMINE HCL 50 MG/ML IJ SOLN
50.0000 mg | Freq: Once | INTRAMUSCULAR | Status: AC
  Administered 2018-07-25: 50 mg via INTRAVENOUS

## 2018-07-25 MED ORDER — HEPARIN SOD (PORK) LOCK FLUSH 100 UNIT/ML IV SOLN
500.0000 [IU] | Freq: Once | INTRAVENOUS | Status: AC | PRN
  Administered 2018-07-25: 500 [IU]
  Filled 2018-07-25: qty 5

## 2018-07-25 MED ORDER — DIPHENHYDRAMINE HCL 50 MG/ML IJ SOLN
INTRAMUSCULAR | Status: AC
  Filled 2018-07-25: qty 1

## 2018-07-25 MED ORDER — ACETAMINOPHEN 325 MG PO TABS
ORAL_TABLET | ORAL | Status: AC
  Filled 2018-07-25: qty 2

## 2018-07-25 MED ORDER — SODIUM CHLORIDE 0.9 % IV SOLN
75.0000 mg/m2 | Freq: Once | INTRAVENOUS | Status: AC
  Administered 2018-07-25: 160 mg via INTRAVENOUS
  Filled 2018-07-25: qty 16

## 2018-07-25 MED ORDER — FAMOTIDINE IN NACL 20-0.9 MG/50ML-% IV SOLN
20.0000 mg | Freq: Once | INTRAVENOUS | Status: AC
  Administered 2018-07-25: 20 mg via INTRAVENOUS

## 2018-07-25 MED ORDER — DEXAMETHASONE SODIUM PHOSPHATE 10 MG/ML IJ SOLN
INTRAMUSCULAR | Status: AC
  Filled 2018-07-25: qty 1

## 2018-07-25 MED ORDER — TRIAMCINOLONE ACETONIDE 0.5 % EX CREA: 1.0000 "application " | TOPICAL_CREAM | Freq: Three times a day (TID) | CUTANEOUS | 0 refills | Status: AC

## 2018-07-25 MED ORDER — METHYLPREDNISOLONE SODIUM SUCC 125 MG IJ SOLR
125.0000 mg | Freq: Once | INTRAMUSCULAR | Status: AC | PRN
  Administered 2018-07-25: 125 mg via INTRAVENOUS

## 2018-07-25 NOTE — Progress Notes (Signed)
During 1st infusion of Cyramza @1023  pt itching and facial flushing. No trouble breathing. Cyramza stopped and NS hung to gravity. Hypersensitivity protocol started. Hives noted on Right chest. Sandi Mealy PA  to tx area VSS. Pepcid hung to gravity. 125 mg Solumedrol given. Redness and "itching" have not resolved completely @ 1047, Sandi Mealy to consult with Dr Julien Nordmann. New hive noted on pt back @ 1050. Per Dr Julien Nordmann no rechalenging of  Cyramza infusion.     Pt face flushing and new hives appearing on right forearm @1150  during first 10 minutes of Taxotere infusion. Taxotere stopped NS hung to gravity and [employer] called to tx area. Hypersensitivity protocol started. 10 mg Decadron given.   No rechalenge of Taxotere today. Sandi Mealy PA prescribed home RX and educated pt, pt and wife verbalized understanding. Pt VSS at d/c

## 2018-07-25 NOTE — Patient Instructions (Signed)
[facility] Discharge Instructions for Patients Receiving Chemotherapy  Today you received the following chemotherapy agents Cyramza and Taxotere  To help prevent nausea and vomiting after your treatment, we encourage you to take your nausea medication as prescribed.  If you develop nausea and vomiting that is not controlled by your nausea medication, call the clinic.   BELOW ARE SYMPTOMS THAT SHOULD BE REPORTED IMMEDIATELY:  *FEVER GREATER THAN 100.5 F  *CHILLS WITH OR WITHOUT FEVER  NAUSEA AND VOMITING THAT IS NOT CONTROLLED WITH YOUR NAUSEA MEDICATION  *UNUSUAL SHORTNESS OF BREATH  *UNUSUAL BRUISING OR BLEEDING  TENDERNESS IN MOUTH AND THROAT WITH OR WITHOUT PRESENCE OF ULCERS  *URINARY PROBLEMS  *BOWEL PROBLEMS  UNUSUAL RASH Items with * indicate a potential emergency and should be followed up as soon as possible.  Feel free to call the clinic should you have any questions or concerns. The clinic phone number is [phone].  Please show the [facility] at check-in to the Emergency Department and triage nurse.  Ramucirumab injection (Cyramza) What is this medicine? RAMUCIRUMAB (ra mue SIR ue mab) is a monoclonal antibody. It is used to treat stomach cancer, colorectal cancer, liver cancer, and lung cancer. This medicine may be used for other purposes; ask your health care provider or pharmacist if you have questions. COMMON BRAND NAME(S): Cyramza What should I tell my health care provider before I take this medicine? They need to know if you have any of these conditions: -bleeding disorders -blood clots -heart disease, including heart failure, heart attack, or chest pain (angina) -high blood pressure -infection (especially a virus infection such as chickenpox, cold sores, or herpes) -protein in your urine -recent surgery -stroke -an unusual or allergic reaction to ramucirumab, other medicines, foods, dyes, or preservatives -pregnant or trying  to get pregnant -breast-feeding How should I use this medicine? This medicine is for infusion into a vein. It is given by a health care professional in a hospital or clinic setting. Talk to your pediatrician regarding the use of this medicine in children. Special care may be needed. Overdosage: If you think you have taken too much of this medicine contact a poison control center or emergency room at once. NOTE: This medicine is only for you. Do not share this medicine with others. What if I miss a dose? It is important not to miss your dose. Call your doctor or health care professional if you are unable to keep an appointment. What may interact with this medicine? Interactions have not been studied. This list may not describe all possible interactions. Give your health care provider a list of all the medicines, herbs, non-prescription drugs, or dietary supplements you use. Also tell them if you smoke, drink alcohol, or use illegal drugs. Some items may interact with your medicine. What should I watch for while using this medicine? Your condition will be monitored carefully while you are receiving this medicine. You will need to to check your blood pressure and have your blood and urine tested while you are taking this medicine. Your condition will be monitored carefully while you are receiving this medicine. This medicine may increase your risk to bruise or bleed. Call your doctor or health care professional if you notice any unusual bleeding. This medicine may rarely cause 'gastrointestinal perforation' (holes in the stomach, intestines or colon), a serious side effect requiring surgery to repair. This medicine should be started at least 28 days following major surgery and the site of the surgery should  be totally healed. Check with your doctor before scheduling dental work or surgery while you are receiving this treatment. Talk to your doctor if you have recently had surgery or if you have a wound  that has not healed. Do not become pregnant while taking this medicine or for 3 months after stopping it. Women should inform their doctor if they wish to become pregnant or think they might be pregnant. There is a potential for serious side effects to an unborn child. Talk to your health care professional or pharmacist for more information. Do not breast-feed an infant while taking this medicine or for 2 months after stopping it. This medicine may interfere with the ability to have a child. Talk with your doctor or health care professional if you are concerned about your fertility. What side effects may I notice from receiving this medicine? Side effects that you should report to your doctor or health care professional as soon as possible: -allergic reactions like skin rash, itching or hives, breathing problems, swelling of the face, lips, or tongue -signs of infection - fever or chills, cough, sore throat -chest pain or chest tightness -confusion -dizziness -feeling faint or lightheaded, falls -severe abdominal pain -severe nausea, vomiting -signs and symptoms of bleeding such as bloody or black, tarry stools; red or dark-brown urine; spitting up blood or brown material that looks like coffee grounds; red spots on the skin; unusual bruising or bleeding from the eye, gums, or nose -signs and symptoms of a blood clot such as breathing problems; changes in vision; chest pain; severe, sudden headache; pain, swelling, warmth in the leg; trouble speaking; sudden numbness or weakness of the face, arm or leg -symptoms of a stroke: change in mental awareness, inability to talk or move one side of the body -trouble walking, dizziness, loss of balance or coordination Side effects that usually do not require medical attention (report to your doctor or health care professional if they continue or are bothersome): -cold, clammy skin -constipation -diarrhea -headache -nausea, vomiting -stomach  pain -unusually slow heartbeat -unusually weak or tired This list may not describe all possible side effects. Call your doctor for medical advice about side effects. You may report side effects to FDA at 1-800-FDA-1088. Where should I keep my medicine? This drug is given in a hospital or clinic and will not be stored at home. NOTE: This sheet is a summary. It may not cover all possible information. If you have questions about this medicine, talk to your doctor, pharmacist, or health care provider.  2019 Elsevier/Gold Standard (2017-12-12 11:25:23)   Docetaxel injection (Taxotere) What is this medicine? DOCETAXEL (doe se TAX el) is a chemotherapy drug. It targets fast dividing cells, like cancer cells, and causes these cells to die. This medicine is used to treat many types of cancers like breast cancer, certain stomach cancers, head and neck cancer, lung cancer, and prostate cancer. This medicine may be used for other purposes; ask your health care provider or pharmacist if you have questions. COMMON BRAND NAME(S): Docefrez, Taxotere What should I tell my health care provider before I take this medicine? They need to know if you have any of these conditions: -infection (especially a virus infection such as chickenpox, cold sores, or herpes) -liver disease -low blood counts, like low white cell, platelet, or red cell counts -an unusual or allergic reaction to docetaxel, polysorbate 80, other chemotherapy agents, other medicines, foods, dyes, or preservatives -pregnant or trying to get pregnant -breast-feeding How should I use this  medicine? This drug is given as an infusion into a vein. It is administered in a hospital or clinic by a specially trained health care professional. Talk to your pediatrician regarding the use of this medicine in children. Special care may be needed. Overdosage: If you think you have taken too much of this medicine contact a poison control center or emergency room  at once. NOTE: This medicine is only for you. Do not share this medicine with others. What if I miss a dose? It is important not to miss your dose. Call your doctor or health care professional if you are unable to keep an appointment. What may interact with this medicine? -cyclosporine -erythromycin -ketoconazole -medicines to increase blood counts like filgrastim, pegfilgrastim, sargramostim -vaccines Talk to your doctor or health care professional before taking any of these medicines: -acetaminophen -aspirin -ibuprofen -ketoprofen -naproxen This list may not describe all possible interactions. Give your health care provider a list of all the medicines, herbs, non-prescription drugs, or dietary supplements you use. Also tell them if you smoke, drink alcohol, or use illegal drugs. Some items may interact with your medicine. What should I watch for while using this medicine? Your condition will be monitored carefully while you are receiving this medicine. You will need important blood work done while you are taking this medicine. This drug may make you feel generally unwell. This is not uncommon, as chemotherapy can affect healthy cells as well as cancer cells. Report any side effects. Continue your course of treatment even though you feel ill unless your doctor tells you to stop. In some cases, you may be given additional medicines to help with side effects. Follow all directions for their use. Call your doctor or health care professional for advice if you get a fever, chills or sore throat, or other symptoms of a cold or flu. Do not treat yourself. This drug decreases your body's ability to fight infections. Try to avoid being around people who are sick. This medicine may increase your risk to bruise or bleed. Call your doctor or health care professional if you notice any unusual bleeding. This medicine may contain alcohol in the product. You may get drowsy or dizzy. Do not drive, use  machinery, or do anything that needs mental alertness until you know how this medicine affects you. Do not stand or sit up quickly, especially if you are an older patient. This reduces the risk of dizzy or fainting spells. Avoid alcoholic drinks. Do not become pregnant while taking this medicine or for 6 months after stopping it. Women should inform their doctor if they wish to become pregnant or think they might be pregnant. Men should not father a child while taking this medicine and for 3 months after stopping it. There is a potential for serious side effects to an unborn child. Talk to your health care professional or pharmacist for more information. Do not breast-feed an infant while taking this medicine or for 2 weeks after stopping it. This may interfere with the ability to father a child. You should talk to your doctor or health care professional if you are concerned about your fertility. What side effects may I notice from receiving this medicine? Side effects that you should report to your doctor or health care professional as soon as possible: -allergic reactions like skin rash, itching or hives, swelling of the face, lips, or tongue -low blood counts - This drug may decrease the number of white blood cells, red blood cells and platelets. You  may be at increased risk for infections and bleeding. -signs of infection - fever or chills, cough, sore throat, pain or difficulty passing urine -signs of decreased platelets or bleeding - bruising, pinpoint red spots on the skin, black, tarry stools, nosebleeds -signs of decreased red blood cells - unusually weak or tired, fainting spells, lightheadedness -breathing problems -fast or irregular heartbeat -low blood pressure -mouth sores -nausea and vomiting -pain, swelling, redness or irritation at the injection site -pain, tingling, numbness in the hands or feet -swelling of the ankle, feet, hands -weight gain Side effects that usually do not  require medical attention (report to your doctor or health care professional if they continue or are bothersome): -bone pain -complete hair loss including hair on your head, underarms, pubic hair, eyebrows, and eyelashes -diarrhea -excessive tearing -changes in the color of fingernails -loosening of the fingernails -nausea -muscle pain -red flush to skin -sweating -weak or tired This list may not describe all possible side effects. Call your doctor for medical advice about side effects. You may report side effects to FDA at 1-800-FDA-1088. Where should I keep my medicine? This drug is given in a hospital or clinic and will not be stored at home. NOTE: This sheet is a summary. It may not cover all possible information. If you have questions about this medicine, talk to your doctor, pharmacist, or health care provider.  2019 Elsevier/Gold Standard (2017-07-30 12:07:21)

## 2018-07-26 NOTE — Progress Notes (Signed)
DATE:  07/25/2018                                         X CHEMO/IMMUNOTHERAPY REACTION             MD:  Dr. Julien Nordmann   AGENT/BLOOD PRODUCT RECEIVING TODAY:               Cyramza and Taxotere   AGENT/BLOOD PRODUCT RECEIVING IMMEDIATELY PRIOR TO REACTION:       Cyramza        VS: BP:      127/67  P:        65       SPO2:        97% on room air                BP:      126/68   P:        66       SPO2:        99% on room air     REACTION(S):            Itching,, facial erythema, and tearing   PREMEDS:      Benadryl 50 mg p.o., Tylenol 650 mg, and Decadron 10 mg IV.   INTERVENTION: Pepcid 20 mg IV x1, Solu-Medrol 125 mg IV x1, Benadryl 25 mg IV x1   Review of Systems  Review of Systems  Constitutional: Negative for chills, diaphoresis and fever.  HENT: Negative for facial swelling, trouble swallowing and voice change.   Eyes:       Tearing of the eyes  Respiratory: Negative for cough, chest tightness, shortness of breath and wheezing.   Cardiovascular: Negative for chest pain and palpitations.  Gastrointestinal: Negative for abdominal pain, constipation, diarrhea, nausea and vomiting.  Musculoskeletal: Negative for back pain and myalgias.  Skin: Positive for rash.       Itching Facial erythema  Neurological: Negative for dizziness, light-headedness and headaches.     Physical Exam  Physical Exam Constitutional:      General: He is not in acute distress.    Appearance: He is not diaphoretic.  HENT:     Head: Normocephalic and atraumatic.  Eyes:     Comments: Tearing of the eyes  Cardiovascular:     Rate and Rhythm: Normal rate and regular rhythm.     Heart sounds: Normal heart sounds. No murmur. No friction rub. No gallop.   Pulmonary:     Effort: Pulmonary effort is normal. No respiratory distress.     Breath sounds: Normal breath sounds. No wheezing or rales.  Skin:    General: Skin is warm and dry.     Findings: Erythema and rash present.     Comments: Facial  erythema Erythema and swelling of the hands Hives of the right chest wall and abdomen  Neurological:     General: No focal deficit present.     Mental Status: He is alert.  Psychiatric:        Mood and Affect: Mood normal.        Behavior: Behavior normal.        Thought Content: Thought content normal.        Judgment: Judgment normal.     OUTCOME:                -The patient was not  able to tolerate Cyramza or Taxotere.    -The patient does not need to return on Saturday for Neulasta.    -The patient was told that he could take a dose of Benadryl later this evening at home if needed.    -The patient was told to use his EpiPen if his symptoms worsen later.    -The patient was given a Medrol dose pack.     -The patient was given a prescription for triamcinolone lotion to use as needed.    -Dr. Julien Nordmann will review the next course of care for this patient.    -The patient is scheduled to return for follow-up on 08/14/2018.   Sandi Mealy, MHS, PA-C  This case was discussed with Dr. Julien Nordmann. He expressed agreement with my management of this patient.

## 2018-07-27 ENCOUNTER — Inpatient Hospital Stay: Payer: BLUE CROSS/BLUE SHIELD

## 2018-07-28 ENCOUNTER — Other Ambulatory Visit: Payer: Self-pay

## 2018-07-28 ENCOUNTER — Emergency Department (HOSPITAL_COMMUNITY): Payer: BLUE CROSS/BLUE SHIELD

## 2018-07-28 ENCOUNTER — Inpatient Hospital Stay (HOSPITAL_COMMUNITY)
Admission: EM | Admit: 2018-07-28 | Discharge: 2018-08-08 | DRG: 286 | Disposition: A | Discharge status: Home / Self Care | Payer: BLUE CROSS/BLUE SHIELD | Attending: Internal Medicine | Admitting: Internal Medicine

## 2018-07-28 ENCOUNTER — Encounter (HOSPITAL_COMMUNITY): Payer: Self-pay | Admitting: Emergency Medicine

## 2018-07-28 DIAGNOSIS — J302 Other seasonal allergic rhinitis: Secondary | ICD-10-CM | POA: Diagnosis present

## 2018-07-28 DIAGNOSIS — Z825 Family history of asthma and other chronic lower respiratory diseases: Secondary | ICD-10-CM

## 2018-07-28 DIAGNOSIS — R0602 Shortness of breath: Secondary | ICD-10-CM

## 2018-07-28 DIAGNOSIS — E785 Hyperlipidemia, unspecified: Secondary | ICD-10-CM | POA: Diagnosis present

## 2018-07-28 DIAGNOSIS — Q249 Congenital malformation of heart, unspecified: Secondary | ICD-10-CM

## 2018-07-28 DIAGNOSIS — I428 Other cardiomyopathies: Secondary | ICD-10-CM | POA: Diagnosis present

## 2018-07-28 DIAGNOSIS — I2510 Atherosclerotic heart disease of native coronary artery without angina pectoris: Secondary | ICD-10-CM | POA: Diagnosis present

## 2018-07-28 DIAGNOSIS — I5043 Acute on chronic combined systolic (congestive) and diastolic (congestive) heart failure: Secondary | ICD-10-CM

## 2018-07-28 DIAGNOSIS — C7989 Secondary malignant neoplasm of other specified sites: Secondary | ICD-10-CM | POA: Diagnosis not present

## 2018-07-28 DIAGNOSIS — F17210 Nicotine dependence, cigarettes, uncomplicated: Secondary | ICD-10-CM | POA: Diagnosis present

## 2018-07-28 DIAGNOSIS — C61 Malignant neoplasm of prostate: Secondary | ICD-10-CM | POA: Diagnosis present

## 2018-07-28 DIAGNOSIS — I5021 Acute systolic (congestive) heart failure: Secondary | ICD-10-CM

## 2018-07-28 DIAGNOSIS — Z923 Personal history of irradiation: Secondary | ICD-10-CM

## 2018-07-28 DIAGNOSIS — I462 Cardiac arrest due to underlying cardiac condition: Secondary | ICD-10-CM | POA: Diagnosis not present

## 2018-07-28 DIAGNOSIS — D539 Nutritional anemia, unspecified: Secondary | ICD-10-CM | POA: Diagnosis present

## 2018-07-28 DIAGNOSIS — I272 Pulmonary hypertension, unspecified: Secondary | ICD-10-CM | POA: Diagnosis present

## 2018-07-28 DIAGNOSIS — Z91018 Allergy to other foods: Secondary | ICD-10-CM

## 2018-07-28 DIAGNOSIS — J438 Other emphysema: Secondary | ICD-10-CM | POA: Diagnosis not present

## 2018-07-28 DIAGNOSIS — Z7901 Long term (current) use of anticoagulants: Secondary | ICD-10-CM

## 2018-07-28 DIAGNOSIS — I480 Paroxysmal atrial fibrillation: Principal | ICD-10-CM | POA: Diagnosis present

## 2018-07-28 DIAGNOSIS — Z9221 Personal history of antineoplastic chemotherapy: Secondary | ICD-10-CM

## 2018-07-28 DIAGNOSIS — E876 Hypokalemia: Secondary | ICD-10-CM | POA: Diagnosis present

## 2018-07-28 DIAGNOSIS — I4891 Unspecified atrial fibrillation: Secondary | ICD-10-CM | POA: Diagnosis not present

## 2018-07-28 DIAGNOSIS — I110 Hypertensive heart disease with heart failure: Secondary | ICD-10-CM | POA: Diagnosis present

## 2018-07-28 DIAGNOSIS — Z8546 Personal history of malignant neoplasm of prostate: Secondary | ICD-10-CM

## 2018-07-28 DIAGNOSIS — I4892 Unspecified atrial flutter: Secondary | ICD-10-CM | POA: Insufficient documentation

## 2018-07-28 DIAGNOSIS — C3432 Malignant neoplasm of lower lobe, left bronchus or lung: Secondary | ICD-10-CM | POA: Diagnosis not present

## 2018-07-28 DIAGNOSIS — I5022 Chronic systolic (congestive) heart failure: Secondary | ICD-10-CM | POA: Diagnosis not present

## 2018-07-28 DIAGNOSIS — I429 Cardiomyopathy, unspecified: Secondary | ICD-10-CM

## 2018-07-28 DIAGNOSIS — I255 Ischemic cardiomyopathy: Secondary | ICD-10-CM | POA: Diagnosis present

## 2018-07-28 DIAGNOSIS — I483 Typical atrial flutter: Secondary | ICD-10-CM | POA: Diagnosis present

## 2018-07-28 DIAGNOSIS — Z79899 Other long term (current) drug therapy: Secondary | ICD-10-CM

## 2018-07-28 DIAGNOSIS — Z716 Tobacco abuse counseling: Secondary | ICD-10-CM

## 2018-07-28 DIAGNOSIS — F1290 Cannabis use, unspecified, uncomplicated: Secondary | ICD-10-CM | POA: Diagnosis present

## 2018-07-28 DIAGNOSIS — C7800 Secondary malignant neoplasm of unspecified lung: Secondary | ICD-10-CM | POA: Diagnosis present

## 2018-07-28 DIAGNOSIS — I252 Old myocardial infarction: Secondary | ICD-10-CM

## 2018-07-28 DIAGNOSIS — Z7951 Long term (current) use of inhaled steroids: Secondary | ICD-10-CM

## 2018-07-28 DIAGNOSIS — J449 Chronic obstructive pulmonary disease, unspecified: Secondary | ICD-10-CM | POA: Diagnosis not present

## 2018-07-28 DIAGNOSIS — Z888 Allergy status to other drugs, medicaments and biological substances status: Secondary | ICD-10-CM

## 2018-07-28 DIAGNOSIS — I493 Ventricular premature depolarization: Secondary | ICD-10-CM | POA: Diagnosis not present

## 2018-07-28 DIAGNOSIS — Z8249 Family history of ischemic heart disease and other diseases of the circulatory system: Secondary | ICD-10-CM

## 2018-07-28 DIAGNOSIS — F17200 Nicotine dependence, unspecified, uncomplicated: Secondary | ICD-10-CM | POA: Diagnosis present

## 2018-07-28 DIAGNOSIS — I10 Essential (primary) hypertension: Secondary | ICD-10-CM

## 2018-07-28 DIAGNOSIS — C3490 Malignant neoplasm of unspecified part of unspecified bronchus or lung: Secondary | ICD-10-CM | POA: Diagnosis not present

## 2018-07-28 DIAGNOSIS — I472 Ventricular tachycardia, unspecified: Secondary | ICD-10-CM

## 2018-07-28 DIAGNOSIS — R000 Tachycardia, unspecified: Secondary | ICD-10-CM

## 2018-07-28 DIAGNOSIS — I5023 Acute on chronic systolic (congestive) heart failure: Secondary | ICD-10-CM | POA: Diagnosis present

## 2018-07-28 DIAGNOSIS — K219 Gastro-esophageal reflux disease without esophagitis: Secondary | ICD-10-CM | POA: Diagnosis present

## 2018-07-28 LAB — COMPREHENSIVE METABOLIC PANEL
ALT: 44 U/L (ref 0–44)
AST: 32 U/L (ref 15–41)
Albumin: 3.9 g/dL (ref 3.5–5.0)
Alkaline Phosphatase: 82 U/L (ref 38–126)
Anion gap: 10 (ref 5–15)
BUN: 29 mg/dL — ABNORMAL HIGH (ref 8–23)
CO2: 28 mmol/L (ref 22–32)
Calcium: 9.3 mg/dL (ref 8.9–10.3)
Chloride: 103 mmol/L (ref 98–111)
Creatinine, Ser: 0.87 mg/dL (ref 0.61–1.24)
GFR calc Af Amer: >60 mL/min (ref >60)
GFR calc non Af Amer: >60 mL/min (ref >60)
Glucose, Bld: 143 mg/dL — ABNORMAL HIGH (ref 70–99)
Potassium: 4.1 mmol/L (ref 3.5–5.1)
Sodium: 141 mmol/L (ref 135–145)
Total Bilirubin: 1 mg/dL (ref 0.3–1.2)
Total Protein: 7.1 g/dL (ref 6.5–8.1)

## 2018-07-28 LAB — CBC
HCT: 37 % — ABNORMAL LOW (ref 39.0–52.0)
Hemoglobin: 11.1 g/dL — ABNORMAL LOW (ref 13.0–17.0)
MCH: 32.6 pg (ref 26.0–34.0)
MCHC: 30 g/dL (ref 30.0–36.0)
MCV: 108.8 fL — ABNORMAL HIGH (ref 80.0–100.0)
Platelets: 217 10*3/uL (ref 150–400)
RBC: 3.4 MIL/uL — ABNORMAL LOW (ref 4.22–5.81)
RDW: 16.7 % — ABNORMAL HIGH (ref 11.5–15.5)
WBC: 13.9 10*3/uL — ABNORMAL HIGH (ref 4.0–10.5)
nRBC: 0 % (ref 0.0–0.2)

## 2018-07-28 MED ORDER — DILTIAZEM HCL-DEXTROSE 100-5 MG/100ML-% IV SOLN (PREMIX)
5.0000 mg/h | INTRAVENOUS | Status: DC
  Administered 2018-07-28: 5 mg/h via INTRAVENOUS
  Administered 2018-07-28 – 2018-07-29 (×4): 10 mg/h via INTRAVENOUS
  Filled 2018-07-28 (×5): qty 100

## 2018-07-28 MED ORDER — SODIUM CHLORIDE 0.9 % IV SOLN
INTRAVENOUS | Status: DC
  Administered 2018-07-28: 20 mL/h via INTRAVENOUS
  Administered 2018-07-29 – 2018-08-04 (×4): via INTRAVENOUS

## 2018-07-28 MED ORDER — ONDANSETRON HCL 4 MG/2ML IJ SOLN: 4.0000 mg | Freq: Four times a day (QID) | INTRAMUSCULAR | Status: DC | PRN

## 2018-07-28 MED ORDER — LEVALBUTEROL HCL 0.63 MG/3ML IN NEBU
0.6300 mg | INHALATION_SOLUTION | Freq: Four times a day (QID) | RESPIRATORY_TRACT | Status: DC | PRN
  Administered 2018-07-30 – 2018-07-31 (×2): 0.63 mg via RESPIRATORY_TRACT
  Filled 2018-07-28 (×2): qty 3

## 2018-07-28 MED ORDER — LORATADINE 10 MG PO TABS
10.0000 mg | ORAL_TABLET | Freq: Every day | ORAL | Status: DC
  Administered 2018-07-29 – 2018-08-08 (×11): 10 mg via ORAL
  Filled 2018-07-28 (×11): qty 1

## 2018-07-28 MED ORDER — GUAIFENESIN ER 600 MG PO TB12
600.0000 mg | ORAL_TABLET | Freq: Two times a day (BID) | ORAL | Status: DC | PRN
  Administered 2018-07-30 – 2018-08-03 (×7): 600 mg via ORAL
  Filled 2018-07-28 (×7): qty 1

## 2018-07-28 MED ORDER — DILTIAZEM LOAD VIA INFUSION
20.0000 mg | Freq: Once | INTRAVENOUS | Status: AC
  Administered 2018-07-28: 20 mg via INTRAVENOUS
  Filled 2018-07-28: qty 20

## 2018-07-28 MED ORDER — APIXABAN 5 MG PO TABS
5.0000 mg | ORAL_TABLET | Freq: Two times a day (BID) | ORAL | Status: AC
  Administered 2018-07-28 – 2018-08-03 (×13): 5 mg via ORAL
  Filled 2018-07-28 (×13): qty 1

## 2018-07-28 MED ORDER — NICOTINE 14 MG/24HR TD PT24
14.0000 mg | MEDICATED_PATCH | Freq: Every day | TRANSDERMAL | Status: DC
  Filled 2018-07-28 (×9): qty 1

## 2018-07-28 MED ORDER — ACETAMINOPHEN 325 MG PO TABS
650.0000 mg | ORAL_TABLET | ORAL | Status: DC | PRN
  Administered 2018-07-29 – 2018-08-06 (×8): 650 mg via ORAL
  Filled 2018-07-28 (×8): qty 2

## 2018-07-28 MED ORDER — PANTOPRAZOLE SODIUM 40 MG PO TBEC
40.0000 mg | DELAYED_RELEASE_TABLET | Freq: Every day | ORAL | Status: DC
  Administered 2018-07-28 – 2018-08-08 (×12): 40 mg via ORAL
  Filled 2018-07-28 (×12): qty 1

## 2018-07-28 MED ORDER — SPIRONOLACTONE 12.5 MG HALF TABLET
12.5000 mg | ORAL_TABLET | Freq: Every day | ORAL | Status: DC
  Administered 2018-07-28 – 2018-07-29 (×2): 12.5 mg via ORAL
  Filled 2018-07-28 (×3): qty 1

## 2018-07-28 MED ORDER — MOMETASONE FURO-FORMOTEROL FUM 200-5 MCG/ACT IN AERO
2.0000 | INHALATION_SPRAY | Freq: Two times a day (BID) | RESPIRATORY_TRACT | Status: DC
  Administered 2018-07-28 – 2018-08-08 (×21): 2 via RESPIRATORY_TRACT
  Filled 2018-07-28: qty 8.8

## 2018-07-28 MED ORDER — LOSARTAN POTASSIUM 50 MG PO TABS
25.0000 mg | ORAL_TABLET | Freq: Every day | ORAL | Status: DC
  Administered 2018-07-29 – 2018-08-01 (×4): 25 mg via ORAL
  Filled 2018-07-28 (×4): qty 1

## 2018-07-28 MED ORDER — MIRTAZAPINE 15 MG PO TABS: 15.0000 mg | ORAL_TABLET | Freq: Every evening | ORAL | Status: DC | PRN

## 2018-07-28 MED ORDER — CARVEDILOL 3.125 MG PO TABS
3.1250 mg | ORAL_TABLET | Freq: Two times a day (BID) | ORAL | Status: DC
  Administered 2018-07-28 – 2018-08-05 (×16): 3.125 mg via ORAL
  Filled 2018-07-28 (×17): qty 1

## 2018-07-28 MED ORDER — SODIUM CHLORIDE 0.9 % IV BOLUS
500.0000 mL | Freq: Once | INTRAVENOUS | Status: AC
  Administered 2018-07-28: 500 mL via INTRAVENOUS

## 2018-07-28 NOTE — ED Triage Notes (Signed)
Patient c/o SOB since yesterday. Hx lung cancer. Last treatment last week. Denies pain.

## 2018-07-28 NOTE — H&P (Signed)
History and Physical    Brian White CLE:[id number] DOB: 1950-12-25 DOA: 07/28/2018  PCP: [name], MD Patient coming from: home  I have personally briefly reviewed patient's old medical records in [place]  Chief Complaint: sob  HPI: Brian White is a 68 y.o. male with medical history significant of stage IIIb lung cancer, chronic systolic heart failure with an EF of 25%, atrial fibrillation/flutter on chronic anticoagulation, hypertension, COPD with continued smoking of approximately 1 pack every 2 days, who presents to the ER reporting shortness of breath progressively worse over the last 24 hours.  Patient reported it happened at home and was not associated with chest pain, exertion or activity.  He reports a sensation of being unable to catch his breath.  He did report they tried to new chemotherapy agents 3 days ago but patient developed an allergic reaction with itching and hives which precluded completion of treatment.  In the ER patient was found to be in A. Fib/flutter with RVR in 130s to 140s.  He was consulted to the hospital service for further eval and treatment after being placed on a diltiazem drip with improvement in heart rate.  ED Course: In the ER patient's BMP was within normal limits, his CBC showed a mildly elevated white count of 13.9 this in the setting of recent steroids associated with chemotherapy chest x-ray was without acute findings.  Heart rate was noted to be in the 130s with improvement of my evaluation to the low 100s on a diltiazem drip after a loading dose of 20 mg and a titratable rate.  Patient also received a 500 mL bolus.  Review of Systems: As per HPI otherwise 10 point review of systems negative.    Past Medical History:  Diagnosis Date  . Adenocarcinoma, lung ([place]) 10/08/2015   2.4cm spiculated LLL nodule suspicious for primary bronchogenic carcinoma by CT 09/2015 - adenocarcinoma by endobronchial biopsy  . Allergy   . Arthritis   .  CAD (coronary artery disease)    mild MI; pt unaware - seen on stress test  . COPD (chronic obstructive pulmonary disease) ([place]) 06/26/2011   Mild centrilobular and paraseptal emphysema by CT 09/2015   . ED (erectile dysfunction)   . H/O anaphylactic shock 2013   hives,hypotension,syncope  . H/O seasonal allergies   . Herpes zoster 12/06/2015  . Hypertension   . Polycythemia 01/25/2014  . Prostate cancer [facility]) 2013   s/p radiation therapy March 2013, planned f/u with uro after XRT  . Radiation 09/18/2011 through 11/10/2011   Prostate 7600 cGy 40 sessions, seminal vesicles 5600 cGy 40 sessions    . Radiation 11/10/15-12/22/15   chest 60 Gy  . Shortness of breath dyspnea   . Smoker   . Thoracic aorta atherosclerosis ([place]) 10/08/2015   Mild by CT 09/2015     Past Surgical History:  Procedure Laterality Date  . COLONOSCOPY W/ BIOPSIES AND POLYPECTOMY  10/11/15   mult TAs, 3cm rectal polypoid lesion; rec further treatment, diverticulosis ([place])  . ENDOBRONCHIAL ULTRASOUND Bilateral 10/18/2015   Procedure: ENDOBRONCHIAL ULTRASOUND;  Surgeon: Javier Glazier, MD;  Location: [facility] ENDOSCOPY;  Service: Cardiopulmonary;  Laterality: Bilateral;  . HERNIA REPAIR Right 2005   with mesh  . INSERTION PROSTATE RADIATION SEED  08/2011   prostate cancer, [place]  . IR FLUORO GUIDE PORT INSERTION RIGHT  07/26/2017  . IR US GUIDE VASC ACCESS RIGHT  07/26/2017     reports that he has been smoking cigarettes. He started smoking about  51 years ago. He has a 36.18 pack-year smoking history. He has never used smokeless tobacco. He reports current alcohol use of about 1.0 standard drinks of alcohol per week. He reports current drug use. Drug: Marijuana.  Allergies  Allergen Reactions  . Onion Anaphylaxis       . Cyramza [Ramucirumab] Itching    Itching, erythema, & tearing  . Taxotere [Docetaxel] Itching    Itching, erythema, & tearing    Family History  Problem Relation Age of  Onset  . Asthma Father   . COPD Father   . CAD Father        possibly  . Emphysema Father   . Breast cancer Sister   . Atrial fibrillation Sister   . Hypertension Mother   . Diabetes Neg Hx   . Stroke Neg Hx   . Colon cancer Neg Hx   . Colon polyps Neg Hx   . Rectal cancer Neg Hx   . Stomach cancer Neg Hx      Prior to Admission medications   Medication Sig Start Date End Date Taking? Authorizing Provider  acetaminophen (TYLENOL) 500 MG tablet Take 1,000 mg by mouth every 6 (six) hours as needed (for pain.).   Yes [provider]  apixaban (ELIQUIS) 5 MG TABS tablet Take 1 tablet (5 mg total) by mouth 2 (two) times daily. 06/05/18  Yes Camnitz, Will Hassell Done, MD  budesonide-formoterol (SYMBICORT) 160-4.5 MCG/ACT inhaler Inhale 1 puff into the lungs 2 (two) times daily. 02/28/17  Yes [name], MD  carvedilol (COREG) 3.125 MG tablet Take 1 tablet (3.125 mg total) by mouth 2 (two) times daily with a meal. 06/05/18  Yes Crenshaw, Denice Bors, MD  dexamethasone (DECADRON) 4 MG tablet TAKE 2 TABLET BY MOUTH TWICE DAILY THE DAY BEFORE, DAY OF AND AFTER THE CHEMOTHERAPY EVERY 3 WEEKS 07/19/18  Yes [name], MD  folic acid (FOLVITE) 1 MG tablet TAKE 1 TABLET(1 MG) BY MOUTH DAILY 07/07/18  Yes [name], MD  furosemide (LASIX) 20 MG tablet Take 1 tablet (20 mg total) by mouth daily as needed for fluid or edema. Patient taking differently: Take 20 mg by mouth daily.  06/05/18 07/28/18 Yes Lelon Perla, MD  guaiFENesin (MUCINEX) 600 MG 12 hr tablet Take 600 mg by mouth 2 (two) times daily as needed (for cough/congestion).    Yes [provider]  ibuprofen (ADVIL,MOTRIN) 200 MG tablet Take 800 mg by mouth every 8 (eight) hours as needed (for pain.).   Yes [provider]  lidocaine-prilocaine (EMLA) cream Apply 1 application topically as needed. Squeeze a  small amount of cream to cotton ball and apply to port site 1-2 hours prior to chemotherapy. Do not  rub in the cream and cover  with plastic wrap. 07/30/17  Yes [name], MD  losartan (COZAAR) 25 MG tablet Take 1 tablet (25 mg total) by mouth daily. 06/05/18  Yes Lelon Perla, MD  methylPREDNISolone (MEDROL DOSEPAK) 4 MG TBPK tablet 6 x 1 day, 5 x 1 day, 4 x 1 day, 3 x 1 day, 2 x 1 day, 1 x 1 day 07/25/18  Yes Tanner, Lyndon Code., PA-C  mirtazapine (REMERON) 15 MG tablet TAKE 1 TABLET(15 MG) BY MOUTH AT BEDTIME Patient taking differently: Take 15 mg by mouth at bedtime as needed (mood/sleep.).  05/30/18  Yes Owens Shark, NP  omeprazole (PRILOSEC) 20 MG capsule Take 1 capsule (20 mg total) by mouth daily. 06/26/18  Yes Maryanna Shape, NP  triamcinolone cream (KENALOG) 0.5 % Apply 1 application topically 3 (three) times daily. 07/25/18  Yes Tanner, Lyndon Code., PA-C  cetirizine (ZYRTEC) 10 MG chewable tablet Chew 10 mg by mouth daily as needed (for seasonal allergies).  02/19/17   [provider]  prochlorperazine (COMPAZINE) 10 MG tablet Take 1 tablet (10 mg total) by mouth every 6 (six) hours as needed for nausea or vomiting. 07/25/18   [name], MD  spironolactone (ALDACTONE) 25 MG tablet Take 0.5 tablets (12.5 mg total) by mouth daily. Patient not taking: Reported on 07/19/2018 05/05/18   Florencia Reasons, MD    Physical Exam: Vitals:   07/28/18 1112 07/28/18 1230 07/28/18 1300  BP: (!) 137/99 130/71 115/85  Pulse: (!) 136 93 (!) 143  Resp: (!) 22 (!) 23   Temp: 97.9 F (36.6 C)    TempSrc: Oral    SpO2: 99% 97% 96%    Constitutional: NAD, calm, comfortable Vitals:   07/28/18 1112 07/28/18 1230 07/28/18 1300  BP: (!) 137/99 130/71 115/85  Pulse: (!) 136 93 (!) 143  Resp: (!) 22 (!) 23   Temp: 97.9 F (36.6 C)    TempSrc: Oral    SpO2: 99% 97% 96%   Eyes: PERRL, lids and conjunctivae normal ENMT: Mucous membranes are moist. Posterior pharynx clear of any exudate or lesions.Normal dentition.  Neck: normal, supple, no masses, no thyromegaly Respiratory: clear to  auscultation bilaterally, no wheezing, no crackles. Normal respiratory effort. No accessory muscle use.  Cardiovascular: Irregularly irregular with a rapid rate no murmurs noted Abdomen: no tenderness, no masses palpated. No hepatosplenomegaly. Bowel sounds positive.  Musculoskeletal: no clubbing / cyanosis. No joint deformity upper and lower extremities. Good ROM, no contractures. Normal muscle tone.  Skin: Chest wall with port in place no evidence of infection no lesions no bleeding Neurologic: CN 2-12 grossly intact. Sensation intact, DTR normal. Strength 5/5 in all 4.  Psychiatric: Normal judgment and insight. Alert and oriented x 3. Normal mood.     Labs on Admission: I have personally reviewed following labs and imaging studies  CBC: Recent Labs  Lab 07/25/18 0748 07/28/18 1211  WBC 7.0 13.9*  NEUTROABS 5.9  --   HGB 10.3* 11.1*  HCT 32.5* 37.0*  MCV 103.2* 108.8*  PLT 210 865   Basic Metabolic Panel: Recent Labs  Lab 07/25/18 0748 07/28/18 1211  NA 141 141  K 4.0 4.1  CL 104 103  CO2 26 28  GLUCOSE 146* 143*  BUN 13 29*  CREATININE 0.75 0.87  CALCIUM 9.2 9.3   GFR: Estimated Creatinine Clearance: 96.8 mL/min (by C-G formula based on SCr of 0.87 mg/dL). Liver Function Tests: Recent Labs  Lab 07/25/18 0748 07/28/18 1211  AST 13* 32  ALT 10 44  ALKPHOS 90 82  BILITOT 0.4 1.0  PROT 7.0 7.1  ALBUMIN 3.4* 3.9   No results for input(s): LIPASE, AMYLASE in the last 168 hours. No results for input(s): AMMONIA in the last 168 hours. Coagulation Profile: No results for input(s): INR, PROTIME in the last 168 hours. Cardiac Enzymes: No results for input(s): CKTOTAL, CKMB, CKMBINDEX, TROPONINI in the last 168 hours. BNP (last 3 results) No results for input(s): PROBNP in the last 8760 hours. HbA1C: No results for input(s): HGBA1C in the last 72 hours. CBG: No results for input(s): GLUCAP in the last 168 hours. Lipid Profile: No results for input(s): CHOL,  HDL, LDLCALC, TRIG, CHOLHDL, LDLDIRECT in the last 72 hours. Thyroid Function Tests: No results for  input(s): TSH, T4TOTAL, FREET4, T3FREE, THYROIDAB in the last 72 hours. Anemia Panel: No results for input(s): VITAMINB12, FOLATE, FERRITIN, TIBC, IRON, RETICCTPCT in the last 72 hours. Urine analysis:    Component Value Date/Time   PROTEINUR NEGATIVE 07/25/2018 0749    Radiological Exams on Admission: Dg Chest Port 1 View  Result Date: 07/28/2018 CLINICAL DATA:  Shortness of breath. EXAM: PORTABLE CHEST 1 VIEW COMPARISON:  Radiographs of May 01, 2018. FINDINGS: Stable cardiomegaly. Right internal jugular Port-A-Cath is unchanged in position. No pneumothorax or pleural effusion is noted. Bony thorax is unremarkable. IMPRESSION: No acute cardiopulmonary abnormality seen. Electronically Signed   By: Marijo Conception, M.D.   On: 07/28/2018 12:22    EKG: Independently reviewed. afib/flutter  Assessment/Plan Principal Problem:   Atrial fibrillation (HCC) Active Problems:   HTN (hypertension)   COPD (chronic obstructive pulmonary disease) (HCC)   Smoker   HLD (hyperlipidemia)   Cancer of lower lobe of left lung (HCC)   Anticoagulated   GERD (gastroesophageal reflux disease)   Chronic systolic CHF (congestive heart failure) (HCC) Leukocytosis   A. fib/flutter.  Patient is already anticoagulated will continue that.  Is on a diltiazem drip which we will continue heart rate has improved.  I will check a TSH, no evidence of infection, this likely an acute flare secondary to patient's lung disease COPD and lung cancer.  Chronic systolic CHF with an EF of 25%.  Avoid aggressive fluid resuscitation.  Patient is not in acute exacerbation.  Strict I's and O's Daily weights.  COPD with tobacco dependence.  Patient is not in acute exacerbation he has chronic wheezing at baseline.  If needed add Xopenex try to avoid stimulation given his A. fib RVR.  Hypertension continue patient's home  medications.  Cancer left lower lobe of left lung.  No acute intervention.  Follow-up with his oncologist as an outpatient  GERD continue PPI  Tobacco dependence.  Unfortunately patient continues to smoke.  We will add nicotine patch provided cessation counseling of less than 4 minutes.  DVT prophylaxis: eliquis Code Status: full Family Communication: discussed with wife and pt Disposition Plan: to home 1-2 days Consults called: none Admission status: obs tele  Nicolette Bang MD [facility]  If 7PM-7AM, please contact night-coverage [email or web address] Password [facility]  07/28/2018, 1:38 PM

## 2018-07-28 NOTE — ED Notes (Signed)
Called floor to give report. Kim RN unavailable awaiting a call back.

## 2018-07-28 NOTE — ED Provider Notes (Signed)
[facility] DEPT Provider Note   CSN: [id number] Arrival date & time: 07/28/18  1107     History   Chief Complaint Chief Complaint  Patient presents with  . Shortness of Breath    HPI Brian White is a 68 y.o. male.  Patient with hx lung, ca, hx a fib/flutter, c/o feeling sob since yesterday. Symptoms acute onset, constant, persistent, moderate, worse w exertion, no alleviating factors.  Pt denies chest pain or discomfort. No leg pain or swelling. Is on eliquis for hx afib. States last chemo tx December - indicates they tried 2 new agents 3 days ago, but developed hives/itching so they stopped.   The history is provided by the patient and the spouse.  Shortness of Breath  Associated symptoms: cough   Associated symptoms: no abdominal pain, no chest pain, no fever, no headaches, no neck pain, no rash, no sore throat and no vomiting     Past Medical History:  Diagnosis Date  . Adenocarcinoma, lung ([place]) 10/08/2015   2.4cm spiculated LLL nodule suspicious for primary bronchogenic carcinoma by CT 09/2015 - adenocarcinoma by endobronchial biopsy  . Allergy   . Arthritis   . CAD (coronary artery disease)    mild MI; pt unaware - seen on stress test  . COPD (chronic obstructive pulmonary disease) ([place]) 06/26/2011   Mild centrilobular and paraseptal emphysema by CT 09/2015   . ED (erectile dysfunction)   . H/O anaphylactic shock 2013   hives,hypotension,syncope  . H/O seasonal allergies   . Herpes zoster 12/06/2015  . Hypertension   . Polycythemia 01/25/2014  . Prostate cancer [facility]) 2013   s/p radiation therapy March 2013, planned f/u with uro after XRT  . Radiation 09/18/2011 through 11/10/2011   Prostate 7600 cGy 40 sessions, seminal vesicles 5600 cGy 40 sessions    . Radiation 11/10/15-12/22/15   chest 60 Gy  . Shortness of breath dyspnea   . Smoker   . Thoracic aorta atherosclerosis ([place]) 10/08/2015   Mild by CT 09/2015      Patient Active Problem List   Diagnosis Date Noted  . GERD (gastroesophageal reflux disease) 06/26/2018  . Anticoagulated 05/13/2018  . Pancytopenia due to chemotherapy ([place]) 05/13/2018  . Cardiomyopathy- etiology not yet determined 05/13/2018  . Tachycardia 05/01/2018  . Low serum thyroid stimulating hormone (TSH) 05/01/2018  . Anemia 05/01/2018  . Atrial flutter ([place]) 05/01/2018  . Itching due to drug 12/19/2017  . Excessive lacrimation 11/07/2017  . Chronic fatigue 05/08/2017  . Encounter for antineoplastic immunotherapy 05/08/2017  . Goals of care, counseling/discussion 05/08/2017  . Rectal polyp 02/28/2017  . Encounter for antineoplastic chemotherapy 12/20/2015  . Herpes zoster 12/06/2015  . Cancer of lower lobe of left lung ([place]) 10/28/2015  . Lung nodule < 6cm on CT   . Thoracic aorta atherosclerosis ([place]) 10/08/2015  . Advanced care planning/counseling discussion 08/27/2015  . Health care maintenance 01/29/2014  . Polycythemia 01/25/2014  . HLD (hyperlipidemia) 01/25/2014  . Medicare annual wellness visit, subsequent 11/22/2012  . H/O seasonal allergies   . Smoker   . CAD (coronary artery disease)   . ED (erectile dysfunction)   . Prostate cancer ([place]) 08/02/2011  . HTN (hypertension) 06/26/2011  . COPD (chronic obstructive pulmonary disease) ([place]) 06/26/2011    Past Surgical History:  Procedure Laterality Date  . COLONOSCOPY W/ BIOPSIES AND POLYPECTOMY  10/11/15   mult TAs, 3cm rectal polypoid lesion; rec further treatment, diverticulosis ([place])  . ENDOBRONCHIAL ULTRASOUND Bilateral 10/18/2015   Procedure:  ENDOBRONCHIAL ULTRASOUND;  Surgeon: Javier Glazier, MD;  Location: [facility];  Service: Cardiopulmonary;  Laterality: Bilateral;  . HERNIA REPAIR Right 2005   with mesh  . INSERTION PROSTATE RADIATION SEED  08/2011   prostate cancer, [place]  . IR FLUORO GUIDE PORT INSERTION RIGHT  07/26/2017  . IR US GUIDE VASC ACCESS RIGHT  07/26/2017         Home Medications    Prior to Admission medications   Medication Sig Start Date End Date Taking? Authorizing Provider  acetaminophen (TYLENOL) 500 MG tablet Take 1,000 mg by mouth every 6 (six) hours as needed (for pain.).    [provider]  apixaban (ELIQUIS) 5 MG TABS tablet Take 1 tablet (5 mg total) by mouth 2 (two) times daily. 06/05/18   Camnitz, Will Hassell Done, MD  budesonide-formoterol (SYMBICORT) 160-4.5 MCG/ACT inhaler Inhale 1 puff into the lungs 2 (two) times daily. 02/28/17   [name], MD  carvedilol (COREG) 3.125 MG tablet Take 1 tablet (3.125 mg total) by mouth 2 (two) times daily with a meal. 06/05/18   Crenshaw, Denice Bors, MD  cetirizine (ZYRTEC) 10 MG chewable tablet Chew 10 mg by mouth daily as needed (for seasonal allergies).  02/19/17   [provider]  dexamethasone (DECADRON) 4 MG tablet TAKE 2 TABLET BY MOUTH TWICE DAILY THE DAY BEFORE, DAY OF AND AFTER THE CHEMOTHERAPY EVERY 3 WEEKS 07/19/18   [name], MD  folic acid (FOLVITE) 1 MG tablet TAKE 1 TABLET(1 MG) BY MOUTH DAILY 07/07/18   [name], MD  furosemide (LASIX) 20 MG tablet Take 1 tablet (20 mg total) by mouth daily as needed for fluid or edema. 06/05/18 07/05/18  Lelon Perla, MD  guaiFENesin (MUCINEX) 600 MG 12 hr tablet Take 600 mg by mouth 2 (two) times daily as needed (for cough/congestion).     [provider]  ibuprofen (ADVIL,MOTRIN) 200 MG tablet Take 800 mg by mouth every 8 (eight) hours as needed (for pain.).    [provider]  lidocaine-prilocaine (EMLA) cream Apply 1 application topically as needed. Squeeze a  small amount of cream to cotton ball and apply to port site 1-2 hours prior to chemotherapy. Do not rub in the cream and cover  with plastic wrap. Patient taking differently: Apply 1 application topically daily as needed (prior to port being accessed for chemo.). Squeeze a  small amount of cream to cotton ball and apply to port site  1-2 hours prior to chemotherapy. Do not rub in the cream and cover  with plastic wrap. 07/30/17   [name], MD  losartan (COZAAR) 25 MG tablet Take 1 tablet (25 mg total) by mouth daily. 06/05/18   Lelon Perla, MD  methylPREDNISolone (MEDROL DOSEPAK) 4 MG TBPK tablet 6 x 1 day, 5 x 1 day, 4 x 1 day, 3 x 1 day, 2 x 1 day, 1 x 1 day 07/25/18   Harle Stanford., PA-C  mirtazapine (REMERON) 15 MG tablet TAKE 1 TABLET(15 MG) BY MOUTH AT BEDTIME Patient taking differently: Take 15 mg by mouth at bedtime as needed (mood/sleep.).  05/30/18   Owens Shark, NP  omeprazole (PRILOSEC) 20 MG capsule Take 1 capsule (20 mg total) by mouth daily. 06/26/18   Maryanna Shape, NP  prochlorperazine (COMPAZINE) 10 MG tablet Take 1 tablet (10 mg total) by mouth every 6 (six) hours as needed for nausea or vomiting. 07/25/18   [name], MD  spironolactone (ALDACTONE) 25 MG tablet Take  0.5 tablets (12.5 mg total) by mouth daily. Patient not taking: Reported on 07/19/2018 05/05/18   Florencia Reasons, MD  triamcinolone cream (KENALOG) 0.5 % Apply 1 application topically 3 (three) times daily. 07/25/18   Harle Stanford., PA-C    Family History Family History  Problem Relation Age of Onset  . Asthma Father   . COPD Father   . CAD Father        possibly  . Emphysema Father   . Breast cancer Sister   . Atrial fibrillation Sister   . Hypertension Mother   . Diabetes Neg Hx   . Stroke Neg Hx   . Colon cancer Neg Hx   . Colon polyps Neg Hx   . Rectal cancer Neg Hx   . Stomach cancer Neg Hx     Social History Social History   Tobacco Use  . Smoking status: Heavy Tobacco Smoker    Packs/day: 0.67    Years: 54.00    Pack years: 36.18    Types: Cigarettes    Start date: 01/16/1967    Last attempt to quit: 11/06/2015    Years since quitting: 2.7  . Smokeless tobacco: Never Used  . Tobacco comment: Peak rate of 2ppd; quit on Chantix but started again   Substance Use Topics  . Alcohol use: Yes     Alcohol/week: 1.0 standard drinks    Types: 1 Shots of liquor per week    Comment: 2-3 beers or mixed drinks daily    . Drug use: Yes    Types: Marijuana    Comment: marijuana occasional weekly     Allergies   Onion   Review of Systems Review of Systems  Constitutional: Negative for chills and fever.  HENT: Negative for rhinorrhea and sore throat.   Eyes: Negative for redness.  Respiratory: Positive for cough and shortness of breath.   Cardiovascular: Negative for chest pain, palpitations and leg swelling.  Gastrointestinal: Negative for abdominal pain and vomiting.  Genitourinary: Negative for flank pain.  Musculoskeletal: Negative for back pain and neck pain.  Skin: Negative for rash.  Neurological: Negative for headaches.  Hematological: Does not bruise/bleed easily.  Psychiatric/Behavioral: Negative for confusion.     Physical Exam Updated Vital Signs BP (!) 137/99 (BP Location: Left Arm)   Pulse (!) 136   Temp 97.9 F (36.6 C) (Oral)   Resp (!) 22   SpO2 99%   Physical Exam Vitals signs and nursing note reviewed.  Constitutional:      Appearance: Normal appearance. He is well-developed.     Comments: Pt tachycardic.   HENT:     Head: Atraumatic.     Nose: Nose normal.     Mouth/Throat:     Mouth: Mucous membranes are moist.     Pharynx: Oropharynx is clear.  Eyes:     General: No scleral icterus.    Conjunctiva/sclera: Conjunctivae normal.     Pupils: Pupils are equal, round, and reactive to light.  Neck:     Musculoskeletal: Normal range of motion and neck supple. No neck rigidity.     Trachea: No tracheal deviation.     Comments: No tm.  Cardiovascular:     Rate and Rhythm: Regular rhythm. Tachycardia present.     Pulses: Normal pulses.     Heart sounds: Normal heart sounds. No murmur. No friction rub. No gallop.   Pulmonary:     Effort: Pulmonary effort is normal. No accessory muscle usage or respiratory distress.  Breath sounds: Normal  breath sounds.  Abdominal:     General: Bowel sounds are normal. There is no distension.     Palpations: Abdomen is soft.     Tenderness: There is no abdominal tenderness. There is no guarding.  Genitourinary:    Comments: No cva tenderness. Musculoskeletal:        General: No swelling.     Right lower leg: No edema.     Left lower leg: No edema.  Skin:    General: Skin is warm and dry.     Findings: No rash.  Neurological:     Mental Status: He is alert.     Comments: Alert, speech clear.   Psychiatric:        Mood and Affect: Mood normal.      ED Treatments / Results  Labs (all labs ordered are listed, but only abnormal results are displayed) Results for orders placed or performed during the hospital encounter of 07/28/18  CBC  Result Value Ref Range   WBC 13.9 (H) 4.0 - 10.5 K/uL   RBC 3.40 (L) 4.22 - 5.81 MIL/uL   Hemoglobin 11.1 (L) 13.0 - 17.0 g/dL   HCT 37.0 (L) 39.0 - 52.0 %   MCV 108.8 (H) 80.0 - 100.0 fL   MCH 32.6 26.0 - 34.0 pg   MCHC 30.0 30.0 - 36.0 g/dL   RDW 16.7 (H) 11.5 - 15.5 %   Platelets 217 150 - 400 K/uL   nRBC 0.0 0.0 - 0.2 %   Ct Chest W Contrast  Result Date: 07/18/2018 CLINICAL DATA:  Patient with history of non-small cell lung cancer status post chemotherapy and radiation. EXAM: CT CHEST, ABDOMEN, AND PELVIS WITH CONTRAST TECHNIQUE: Multidetector CT imaging of the chest, abdomen and pelvis was performed following the standard protocol during bolus administration of intravenous contrast. CONTRAST:  140mL OMNIPAQUE IOHEXOL 300 MG/ML  SOLN COMPARISON:  CT CAP 04/22/2018 FINDINGS: CT CHEST FINDINGS Cardiovascular: Heart is enlarged. New small pericardial effusion. Coronary arterial vascular calcifications. Thoracic aortic vascular calcifications. Mediastinum/Nodes: Interval increase in size of soft tissue within the AP window measuring 3.5 x 3.9 cm (image 23; series 2), previously 2.9 x 2.4 cm. Increase in left infrahilar adenopathy measuring 1.5 cm  (image 34; series 2), previously 0.7 cm. Increased left infrahilar and left hilar soft tissue fullness. Additionally, there is new vague soft tissue fullness within the right hilum (image 21; series 2). Lungs/Pleura: Marked narrowing of the left mainstem bronchus (image 63; series 4). Additionally there is narrowing of the lingular and left lower lobe bronchi. Interval increase in nodularity within the medial left lower lobe at the site of associated scarring with nodularity measuring up to 1.1 cm (image 90; series 4), previously 0.7 cm. Unchanged 6 mm low-attenuation nodule left upper lobe (image 33; series 4). New small left layering pleural effusion. No pneumothorax. Patchy ground-glass and consolidative opacities within the medial right upper lobe (image 54; series 4), new from prior. Musculoskeletal: Thoracic spine degenerative changes. No aggressive or acute appearing osseous lesions. CT ABDOMEN PELVIS FINDINGS Hepatobiliary: The liver is normal in size and contour. Fatty deposition adjacent to the falciform ligament. Gallbladder is decompressed. No intrahepatic or extrahepatic biliary ductal dilatation. Pancreas: Unremarkable Spleen: Unremarkable Adrenals/Urinary Tract: Normal adrenal glands. Bilateral perinephric fat stranding. Urinary bladder wall thickening. No after fro CIS. Stomach/Bowel: No abnormal bowel wall thickening or evidence for bowel obstruction. No free fluid or free intraperitoneal air. No are of the stomach. Vascular/Lymphatic: Normal caliber abdominal aorta.  Interval increase in size of aortocaval lymph node measuring 2.4 x 1.8 cm (image 70; series 2), previously 2.2 x 1.6 cm. 1.4 cm preaortic lymph node (image 75; series 2), unchanged from prior. Interval increase in size of gastrohepatic node measuring 2.0 cm (image 55; series 2), previously 1.4 cm. Reproductive: Heterogeneous enlarged prostate. Other: Small bilateral fat containing inguinal hernias. Musculoskeletal: Lumbar spine  degenerative changes. No aggressive or acute appearing osseous lesions. IMPRESSION: 1. Interval increase in size of nodal mass within the AP window as well as soft tissue extending into the left hilum. There is associated narrowing of the left lung bronchi. 2. Increased nodularity within the left lower lobe which may represent disease progression. New small left pleural effusion. 3. Interval increase in size of a few of the retroperitoneal lymph nodes and gastrohepatic node within the abdomen. 4. New ground-glass and consolidative opacities within the medial right upper lobe which may be infectious/inflammatory in etiology. Recommend attention on follow-up. Electronically Signed   By: Lovey Newcomer M.D.   On: 07/18/2018 13:05   Ct Abdomen Pelvis W Contrast  Result Date: 07/18/2018 CLINICAL DATA:  Patient with history of non-small cell lung cancer status post chemotherapy and radiation. EXAM: CT CHEST, ABDOMEN, AND PELVIS WITH CONTRAST TECHNIQUE: Multidetector CT imaging of the chest, abdomen and pelvis was performed following the standard protocol during bolus administration of intravenous contrast. CONTRAST:  141mL OMNIPAQUE IOHEXOL 300 MG/ML  SOLN COMPARISON:  CT CAP 04/22/2018 FINDINGS: CT CHEST FINDINGS Cardiovascular: Heart is enlarged. New small pericardial effusion. Coronary arterial vascular calcifications. Thoracic aortic vascular calcifications. Mediastinum/Nodes: Interval increase in size of soft tissue within the AP window measuring 3.5 x 3.9 cm (image 23; series 2), previously 2.9 x 2.4 cm. Increase in left infrahilar adenopathy measuring 1.5 cm (image 34; series 2), previously 0.7 cm. Increased left infrahilar and left hilar soft tissue fullness. Additionally, there is new vague soft tissue fullness within the right hilum (image 21; series 2). Lungs/Pleura: Marked narrowing of the left mainstem bronchus (image 63; series 4). Additionally there is narrowing of the lingular and left lower lobe bronchi.  Interval increase in nodularity within the medial left lower lobe at the site of associated scarring with nodularity measuring up to 1.1 cm (image 90; series 4), previously 0.7 cm. Unchanged 6 mm low-attenuation nodule left upper lobe (image 33; series 4). New small left layering pleural effusion. No pneumothorax. Patchy ground-glass and consolidative opacities within the medial right upper lobe (image 54; series 4), new from prior. Musculoskeletal: Thoracic spine degenerative changes. No aggressive or acute appearing osseous lesions. CT ABDOMEN PELVIS FINDINGS Hepatobiliary: The liver is normal in size and contour. Fatty deposition adjacent to the falciform ligament. Gallbladder is decompressed. No intrahepatic or extrahepatic biliary ductal dilatation. Pancreas: Unremarkable Spleen: Unremarkable Adrenals/Urinary Tract: Normal adrenal glands. Bilateral perinephric fat stranding. Urinary bladder wall thickening. No after fro CIS. Stomach/Bowel: No abnormal bowel wall thickening or evidence for bowel obstruction. No free fluid or free intraperitoneal air. No are of the stomach. Vascular/Lymphatic: Normal caliber abdominal aorta. Interval increase in size of aortocaval lymph node measuring 2.4 x 1.8 cm (image 70; series 2), previously 2.2 x 1.6 cm. 1.4 cm preaortic lymph node (image 75; series 2), unchanged from prior. Interval increase in size of gastrohepatic node measuring 2.0 cm (image 55; series 2), previously 1.4 cm. Reproductive: Heterogeneous enlarged prostate. Other: Small bilateral fat containing inguinal hernias. Musculoskeletal: Lumbar spine degenerative changes. No aggressive or acute appearing osseous lesions. IMPRESSION: 1. Interval increase in  size of nodal mass within the AP window as well as soft tissue extending into the left hilum. There is associated narrowing of the left lung bronchi. 2. Increased nodularity within the left lower lobe which may represent disease progression. New small left  pleural effusion. 3. Interval increase in size of a few of the retroperitoneal lymph nodes and gastrohepatic node within the abdomen. 4. New ground-glass and consolidative opacities within the medial right upper lobe which may be infectious/inflammatory in etiology. Recommend attention on follow-up. Electronically Signed   By: Lovey Newcomer M.D.   On: 07/18/2018 13:05   Dg Chest Port 1 View  Result Date: 07/28/2018 CLINICAL DATA:  Shortness of breath. EXAM: PORTABLE CHEST 1 VIEW COMPARISON:  Radiographs of May 01, 2018. FINDINGS: Stable cardiomegaly. Right internal jugular Port-A-Cath is unchanged in position. No pneumothorax or pleural effusion is noted. Bony thorax is unremarkable. IMPRESSION: No acute cardiopulmonary abnormality seen. Electronically Signed   By: Marijo Conception, M.D.   On: 07/28/2018 12:22    EKG EKG Interpretation  Date/Time:  Sunday July 28 2018 12:13:42 EST Ventricular Rate:  105 PR Interval:    QRS Duration: 149 QT Interval:  353 QTC Calculation: 384 R Axis:   -49 Text Interpretation:  Atrial flutter with varied AV block, Nonspecific IVCD with LAD Non-specific ST-t changes Confirmed by Lajean Saver [phone]) on 07/28/2018 12:47:19 PM   Radiology Dg Chest Port 1 View  Result Date: 07/28/2018 CLINICAL DATA:  Shortness of breath. EXAM: PORTABLE CHEST 1 VIEW COMPARISON:  Radiographs of May 01, 2018. FINDINGS: Stable cardiomegaly. Right internal jugular Port-A-Cath is unchanged in position. No pneumothorax or pleural effusion is noted. Bony thorax is unremarkable. IMPRESSION: No acute cardiopulmonary abnormality seen. Electronically Signed   By: Marijo Conception, M.D.   On: 07/28/2018 12:22    Procedures Procedures (including critical care time)  Medications Ordered in ED Medications  0.9 %  sodium chloride infusion (has no administration in time range)  diltiazem (CARDIZEM) 1 mg/mL load via infusion 20 mg (has no administration in time range)    And   diltiazem (CARDIZEM) 100 mg in dextrose 5% 112mL (1 mg/mL) infusion (has no administration in time range)     Initial Impression / Assessment and Plan / ED Course  I have reviewed the triage vital signs and the nursing notes.  Pertinent labs & imaging results that were available during my care of the patient were reviewed by me and considered in my medical decision making (see chart for details).  Iv ns. Continuous pulse ox and monitor. 02 [place].   On monitor, pt in atrial flutter, rapid ventricular response.  Given rapid afib/flutter, tachy, dyspnea - will start cardizem bolus/gtt.  Labs sent.   Labs reviewed - wbc mildly elev - no pna on cxr.   CXR reviewed - no pna.   Rate improved on cardizem gtt. Pt notes dyspnea mildly improved.   Hospitalists consulted for admission.   CRITICAL CARE RE: Atrial flutter with rapid ventricular response, cardizem iv bolus/gtt. Lung cancer, copd.  Performed by: Mirna Mires Total critical care time: 35 minutes Critical care time was exclusive of separately billable procedures and treating other patients. Critical care was necessary to treat or prevent imminent or life-threatening deterioration. Critical care was time spent personally by me on the following activities: development of treatment plan with patient and/or surrogate as well as nursing, discussions with consultants, evaluation of patient's response to treatment, examination of patient, obtaining history from patient or  surrogate, ordering and performing treatments and interventions, ordering and review of laboratory studies, ordering and review of radiographic studies, pulse oximetry and re-evaluation of patient's condition.   Final Clinical Impressions(s) / ED Diagnoses   Final diagnoses:  None    ED Discharge Orders    None       Lajean Saver, MD 07/28/18 1250

## 2018-07-28 NOTE — ED Notes (Signed)
ED Provider at bedside. 

## 2018-07-29 ENCOUNTER — Other Ambulatory Visit: Payer: Self-pay

## 2018-07-29 ENCOUNTER — Encounter (HOSPITAL_COMMUNITY): Payer: Self-pay | Admitting: Cardiology

## 2018-07-29 DIAGNOSIS — R000 Tachycardia, unspecified: Secondary | ICD-10-CM | POA: Diagnosis not present

## 2018-07-29 DIAGNOSIS — I252 Old myocardial infarction: Secondary | ICD-10-CM | POA: Diagnosis not present

## 2018-07-29 DIAGNOSIS — Z7901 Long term (current) use of anticoagulants: Secondary | ICD-10-CM | POA: Diagnosis not present

## 2018-07-29 DIAGNOSIS — I472 Ventricular tachycardia: Secondary | ICD-10-CM | POA: Diagnosis not present

## 2018-07-29 DIAGNOSIS — F1290 Cannabis use, unspecified, uncomplicated: Secondary | ICD-10-CM | POA: Diagnosis present

## 2018-07-29 DIAGNOSIS — J438 Other emphysema: Secondary | ICD-10-CM

## 2018-07-29 DIAGNOSIS — R0602 Shortness of breath: Secondary | ICD-10-CM | POA: Diagnosis not present

## 2018-07-29 DIAGNOSIS — I4891 Unspecified atrial fibrillation: Secondary | ICD-10-CM | POA: Diagnosis not present

## 2018-07-29 DIAGNOSIS — I361 Nonrheumatic tricuspid (valve) insufficiency: Secondary | ICD-10-CM | POA: Diagnosis not present

## 2018-07-29 DIAGNOSIS — I4892 Unspecified atrial flutter: Secondary | ICD-10-CM | POA: Diagnosis not present

## 2018-07-29 DIAGNOSIS — I493 Ventricular premature depolarization: Secondary | ICD-10-CM | POA: Diagnosis not present

## 2018-07-29 DIAGNOSIS — I110 Hypertensive heart disease with heart failure: Secondary | ICD-10-CM | POA: Diagnosis present

## 2018-07-29 DIAGNOSIS — I429 Cardiomyopathy, unspecified: Secondary | ICD-10-CM | POA: Diagnosis not present

## 2018-07-29 DIAGNOSIS — I428 Other cardiomyopathies: Secondary | ICD-10-CM | POA: Diagnosis present

## 2018-07-29 DIAGNOSIS — J449 Chronic obstructive pulmonary disease, unspecified: Secondary | ICD-10-CM | POA: Diagnosis not present

## 2018-07-29 DIAGNOSIS — I2510 Atherosclerotic heart disease of native coronary artery without angina pectoris: Secondary | ICD-10-CM | POA: Diagnosis present

## 2018-07-29 DIAGNOSIS — K219 Gastro-esophageal reflux disease without esophagitis: Secondary | ICD-10-CM | POA: Diagnosis present

## 2018-07-29 DIAGNOSIS — E785 Hyperlipidemia, unspecified: Secondary | ICD-10-CM | POA: Diagnosis not present

## 2018-07-29 DIAGNOSIS — I5043 Acute on chronic combined systolic (congestive) and diastolic (congestive) heart failure: Secondary | ICD-10-CM | POA: Diagnosis not present

## 2018-07-29 DIAGNOSIS — Z8249 Family history of ischemic heart disease and other diseases of the circulatory system: Secondary | ICD-10-CM | POA: Diagnosis not present

## 2018-07-29 DIAGNOSIS — I462 Cardiac arrest due to underlying cardiac condition: Secondary | ICD-10-CM | POA: Diagnosis not present

## 2018-07-29 DIAGNOSIS — I5022 Chronic systolic (congestive) heart failure: Secondary | ICD-10-CM | POA: Diagnosis not present

## 2018-07-29 DIAGNOSIS — I5021 Acute systolic (congestive) heart failure: Secondary | ICD-10-CM | POA: Diagnosis not present

## 2018-07-29 DIAGNOSIS — F17210 Nicotine dependence, cigarettes, uncomplicated: Secondary | ICD-10-CM | POA: Diagnosis present

## 2018-07-29 DIAGNOSIS — I272 Pulmonary hypertension, unspecified: Secondary | ICD-10-CM | POA: Diagnosis present

## 2018-07-29 DIAGNOSIS — I370 Nonrheumatic pulmonary valve stenosis: Secondary | ICD-10-CM | POA: Diagnosis not present

## 2018-07-29 DIAGNOSIS — C3432 Malignant neoplasm of lower lobe, left bronchus or lung: Secondary | ICD-10-CM | POA: Diagnosis not present

## 2018-07-29 DIAGNOSIS — C3492 Malignant neoplasm of unspecified part of left bronchus or lung: Secondary | ICD-10-CM | POA: Diagnosis not present

## 2018-07-29 DIAGNOSIS — C7800 Secondary malignant neoplasm of unspecified lung: Secondary | ICD-10-CM | POA: Diagnosis present

## 2018-07-29 DIAGNOSIS — C61 Malignant neoplasm of prostate: Secondary | ICD-10-CM | POA: Diagnosis present

## 2018-07-29 DIAGNOSIS — I480 Paroxysmal atrial fibrillation: Secondary | ICD-10-CM | POA: Diagnosis present

## 2018-07-29 DIAGNOSIS — D539 Nutritional anemia, unspecified: Secondary | ICD-10-CM | POA: Diagnosis not present

## 2018-07-29 DIAGNOSIS — I5023 Acute on chronic systolic (congestive) heart failure: Secondary | ICD-10-CM | POA: Diagnosis not present

## 2018-07-29 DIAGNOSIS — I255 Ischemic cardiomyopathy: Secondary | ICD-10-CM | POA: Diagnosis present

## 2018-07-29 DIAGNOSIS — J302 Other seasonal allergic rhinitis: Secondary | ICD-10-CM | POA: Diagnosis present

## 2018-07-29 DIAGNOSIS — C3490 Malignant neoplasm of unspecified part of unspecified bronchus or lung: Secondary | ICD-10-CM | POA: Diagnosis not present

## 2018-07-29 DIAGNOSIS — E876 Hypokalemia: Secondary | ICD-10-CM | POA: Diagnosis not present

## 2018-07-29 DIAGNOSIS — F17200 Nicotine dependence, unspecified, uncomplicated: Secondary | ICD-10-CM | POA: Diagnosis not present

## 2018-07-29 DIAGNOSIS — I483 Typical atrial flutter: Secondary | ICD-10-CM | POA: Diagnosis not present

## 2018-07-29 LAB — BASIC METABOLIC PANEL
Anion gap: 8 (ref 5–15)
BUN: 28 mg/dL — ABNORMAL HIGH (ref 8–23)
CO2: 29 mmol/L (ref 22–32)
Calcium: 8.9 mg/dL (ref 8.9–10.3)
Chloride: 104 mmol/L (ref 98–111)
Creatinine, Ser: 0.87 mg/dL (ref 0.61–1.24)
GFR calc Af Amer: >60 mL/min (ref >60)
GFR calc non Af Amer: >60 mL/min (ref >60)
Glucose, Bld: 128 mg/dL — ABNORMAL HIGH (ref 70–99)
Potassium: 3.9 mmol/L (ref 3.5–5.1)
Sodium: 141 mmol/L (ref 135–145)

## 2018-07-29 LAB — CBC
HCT: 35.2 % — ABNORMAL LOW (ref 39.0–52.0)
Hemoglobin: 10.8 g/dL — ABNORMAL LOW (ref 13.0–17.0)
MCH: 32.5 pg (ref 26.0–34.0)
MCHC: 30.7 g/dL (ref 30.0–36.0)
MCV: 106 fL — ABNORMAL HIGH (ref 80.0–100.0)
Platelets: 207 10*3/uL (ref 150–400)
RBC: 3.32 MIL/uL — ABNORMAL LOW (ref 4.22–5.81)
RDW: 16.5 % — ABNORMAL HIGH (ref 11.5–15.5)
WBC: 11.6 10*3/uL — ABNORMAL HIGH (ref 4.0–10.5)
nRBC: 0 % (ref 0.0–0.2)

## 2018-07-29 LAB — TSH: TSH: 0.568 u[IU]/mL (ref 0.350–4.500)

## 2018-07-29 MED ORDER — FUROSEMIDE 10 MG/ML IJ SOLN
20.0000 mg | Freq: Once | INTRAMUSCULAR | Status: AC
  Administered 2018-07-29: 20 mg via INTRAVENOUS
  Filled 2018-07-29: qty 2

## 2018-07-29 MED ORDER — FUROSEMIDE 10 MG/ML IJ SOLN
40.0000 mg | Freq: Once | INTRAMUSCULAR | Status: AC
  Administered 2018-07-29: 40 mg via INTRAVENOUS
  Filled 2018-07-29: qty 4

## 2018-07-29 MED ORDER — FUROSEMIDE 10 MG/ML IJ SOLN: 40.0000 mg | Freq: Once | INTRAMUSCULAR | Status: DC

## 2018-07-29 MED ORDER — SODIUM CHLORIDE 0.9% FLUSH: 10.0000 mL | INTRAVENOUS | Status: DC | PRN

## 2018-07-29 NOTE — Progress Notes (Addendum)
[facility] PROGRESS NOTE  Brian White YTK:[id number] DOB: 1950-11-04 DOA: 07/28/2018  PCP: [name], MD  Brief History/Interval Summary: 68 y.o. male with medical history significant of stage IIIb lung cancer, chronic systolic heart failure with an EF of 25%, atrial fibrillation/flutter on chronic anticoagulation, hypertension, COPD with continued smoking of approximately 1 pack every 2 days, who presented to the ER reporting shortness of breath progressively worse over the last 24 hours. In the ER patient was found to be in A. Fib/flutter with RVR in 130s to 140s.    Patient was started on a diltiazem infusion and was hospitalized for further management.  Reason for Visit: Atrial flutter with RVR  Consultants: Cardiology  Procedures: None yet  Antibiotics: None  Subjective/Interval History: Patient states that he has been feeling more short of breath than usual especially with exertion.  Denies any cough.  No chest pain.  Has also noticed lower extremity swelling worse than usual.  ROS: Denies any headaches.  Objective:  Vital Signs  Vitals:   07/29/18 0505 07/29/18 0818 07/29/18 1034 07/29/18 1115  BP:  107/85  134/75  Pulse:  99  96  Resp:  18  18  Temp:  98 F (36.7 C)    TempSrc:  Oral    SpO2:  98% 98% 98%  Weight: 92.1 kg     Height:        Intake/Output Summary (Last 24 hours) at 07/29/2018 1305 Last data filed at 07/29/2018 0700 Gross per 24 hour  Intake 1460 ml  Output 295 ml  Net 1165 ml   Filed Weights   07/28/18 1529 07/29/18 0505  Weight: 90.9 kg 92.1 kg    General appearance: alert, cooperative, appears stated age and no distress Resp: Normal effort at rest.  Coarse breath sounds bilaterally.  No wheezing or rhonchi.  No crackles. Cardio: S1-S2 irregularly irregular.  Telemetry shows atrial flutter. GI: soft, non-tender; bowel sounds normal; no masses,  no organomegaly Extremities: 1 to 2+ pitting edema noted bilateral lower  extremities. Pulses: 2+ and symmetric Neurologic: No focal neurological deficits  Lab Results:  Data Reviewed: I have personally reviewed following labs and imaging studies  CBC: Recent Labs  Lab 07/25/18 0748 07/28/18 1211 07/29/18 0547  WBC 7.0 13.9* 11.6*  NEUTROABS 5.9  --   --   HGB 10.3* 11.1* 10.8*  HCT 32.5* 37.0* 35.2*  MCV 103.2* 108.8* 106.0*  PLT 210 217 557    Basic Metabolic Panel: Recent Labs  Lab 07/25/18 0748 07/28/18 1211 07/29/18 0547  NA 141 141 141  K 4.0 4.1 3.9  CL 104 103 104  CO2 26 28 29   GLUCOSE 146* 143* 128*  BUN 13 29* 28*  CREATININE 0.75 0.87 0.87  CALCIUM 9.2 9.3 8.9    GFR: Estimated Creatinine Clearance: 96.9 mL/min (by C-G formula based on SCr of 0.87 mg/dL).  Liver Function Tests: Recent Labs  Lab 07/25/18 0748 07/28/18 1211  AST 13* 32  ALT 10 44  ALKPHOS 90 82  BILITOT 0.4 1.0  PROT 7.0 7.1  ALBUMIN 3.4* 3.9    Thyroid Function Tests: Recent Labs    07/29/18 0547  TSH 0.568     Radiology Studies: Dg Chest Port 1 View  Result Date: 07/28/2018 CLINICAL DATA:  Shortness of breath. EXAM: PORTABLE CHEST 1 VIEW COMPARISON:  Radiographs of May 01, 2018. FINDINGS: Stable cardiomegaly. Right internal jugular Port-A-Cath is unchanged in position. No pneumothorax or pleural effusion is noted. Bony thorax is  unremarkable. IMPRESSION: No acute cardiopulmonary abnormality seen. Electronically Signed   By: Marijo Conception, M.D.   On: 07/28/2018 12:22     Medications:  Scheduled: . apixaban  5 mg Oral BID  . carvedilol  3.125 mg Oral BID WC  . loratadine  10 mg Oral Daily  . losartan  25 mg Oral Daily  . mometasone-formoterol  2 puff Inhalation BID  . nicotine  14 mg Transdermal Daily  . pantoprazole  40 mg Oral Daily  . spironolactone  12.5 mg Oral Daily   Continuous: . sodium chloride 20 mL/hr (07/28/18 1206)  . diltiazem (CARDIZEM) infusion 10 mg/hr (07/29/18 1219)   SAY:TKZSWFUXNATFT, guaiFENesin,  levalbuterol, mirtazapine, ondansetron (ZOFRAN) IV, sodium chloride flush    Assessment/Plan:   Atrial flutter/atrial fibrillation with RVR Patient is on a diltiazem infusion.  Heart rate is reasonably well controlled. This could be the reason for his dyspnea.  Saturating normal on room air.  Cardiology will be consulted to assist with management of atrial flutter.  Seen by electrophysiology last year and plan initially was for ablation.  However patient decided to not undergo the same.  TSH 0.568.  Patient is anticoagulated with apixaban.  Acute on chronic systolic CHF EF known to be 25%.  Patient does not appear to be in acute exacerbation currently.  However he does have significant lower extremity edema which has been worsening progressively.  Weight is noted to be 92.1 kg.  Weight was 87.8 kg in November and 91.1 kg in December.  May benefit from intravenous Lasix.  Low suspicion for venous thromboembolism as the patient is already on anticoagulation.  History of COPD with tobacco dependence No exacerbation at this time.  Unfortunately patient continues to smoke cigarettes.  Dyspnea could be due to the atrial flutter.  Saturating normal on room air.  Could try Xopenex however it could potentially worsen the heart rate.  Essential hypertension Monitor blood pressure closely.  Lung cancer Followed by oncology at the cancer center.  Seen on 1/9.  Patient developed reaction to Cyramza and some flushing to Taxotere.  Further management as outpatient.  History of GERD Continue PPI  Tobacco dependence Unfortunately patient continues to smoke cigarettes.  Nicotine patch.  Macrocytic anemia Hemoglobin stable.  No evidence of overt bleeding.  DVT Prophylaxis: Eliquis    Code Status: Full code Family Communication: Discussed with the patient and his wife Disposition Plan: Management as outlined above.    LOS: 0 days   Shreyan Hinz [employer] on  [email or web address]  07/29/2018, 1:05 PM

## 2018-07-29 NOTE — Progress Notes (Signed)
   07/29/18 1451  Vitals  BP 104/64  MAP (mmHg) 74  BP Location Left Arm  BP Method Automatic  Patient Position (if appropriate) Sitting  Pulse Rate 84  Oxygen Therapy  SpO2 99 %  O2 Device Room Air  patient within goal heart rate with rate in 80s-90s.  Cardiology NP on unit and notified.

## 2018-07-29 NOTE — Progress Notes (Addendum)
Patient had 5 beats of VTach.  Patient sitting up in chair.  Denies chest pain, dizziness, shortness of breath.  Cardiology on unit and notified - to see patient.

## 2018-07-29 NOTE — Consult Note (Addendum)
Cardiology Consultation:   Patient ID: Brian White MRN: [id number]; DOB: 03/30/1951  Admit date: 07/28/2018 Date of Consult: 07/29/2018  Primary Care Provider: [name], MD Primary Cardiologist: Kirk Ruths, MD  Primary Electrophysiologist:  Constance Haw, MD    Patient Profile:   Brian White is a 68 y.o. male with a hx of HTN, Prostate cancer, Stage IIIb non-small cell lung cancer with chemo, cardiomyopathy with EF 25-30% on echo 05/02/18 and a flutter  who is being seen today for the evaluation of a flutter at the request of Dr, [place] Pink.  History of Present Illness:   Brian White with history as above and in 04/2018 dx of typical  A flutter he converted wit IV dilt and on IV heparin and was changed to Eliquis.  Also on coreg, found to have CM wit EF 25-30% thought to be tachycardia mediated.   Pt with pancytopenia with chemo.  ARB and spironolactone started.  Entresto held due to soft BP.  D/c'd on Eliquis 5 mg BID.  CHA2DS2VASc of 3.   Plan for a fib ablation in the future.  Thought to ischemic work up if EF does not improve.  Pt does continue to smoke.   Now admitted 07/28/17 for SOB, that had increased over 24 hours prior to admit. No chest pain.  New chemo 3 days prior to admit with allergic reaction.  In ER pt in a fib/flutter.  EKG a flutter rate  HR 105.  Lateral T wave inversions but similar to 05/2018 EKGs.   No troponins Na 141, K+ 3.9, BUN 28, Cr 0.87  Hgb 10.8, Hct 35.2 WBC 11.6  TSH 0.568  CXR No acute cardiopulmonary abnormality seen.  Past Medical History:  Diagnosis Date  . Adenocarcinoma, lung ([place]) 10/08/2015   2.4cm spiculated LLL nodule suspicious for primary bronchogenic carcinoma by CT 09/2015 - adenocarcinoma by endobronchial biopsy  . Allergy   . Arthritis   . CAD (coronary artery disease)    mild MI; pt unaware - seen on stress test  . COPD (chronic obstructive pulmonary disease) ([place]) 06/26/2011   Mild centrilobular and paraseptal  emphysema by CT 09/2015   . ED (erectile dysfunction)   . H/O anaphylactic shock 2013   hives,hypotension,syncope  . H/O seasonal allergies   . Herpes zoster 12/06/2015  . Hypertension   . Polycythemia 01/25/2014  . Prostate cancer [facility]) 2013   s/p radiation therapy March 2013, planned f/u with uro after XRT  . Radiation 09/18/2011 through 11/10/2011   Prostate 7600 cGy 40 sessions, seminal vesicles 5600 cGy 40 sessions    . Radiation 11/10/15-12/22/15   chest 60 Gy  . Shortness of breath dyspnea   . Smoker   . Thoracic aorta atherosclerosis ([place]) 10/08/2015   Mild by CT 09/2015     Past Surgical History:  Procedure Laterality Date  . COLONOSCOPY W/ BIOPSIES AND POLYPECTOMY  10/11/15   mult TAs, 3cm rectal polypoid lesion; rec further treatment, diverticulosis ([place])  . ENDOBRONCHIAL ULTRASOUND Bilateral 10/18/2015   Procedure: ENDOBRONCHIAL ULTRASOUND;  Surgeon: Javier Glazier, MD;  Location: [facility] ENDOSCOPY;  Service: Cardiopulmonary;  Laterality: Bilateral;  . HERNIA REPAIR Right 2005   with mesh  . INSERTION PROSTATE RADIATION SEED  08/2011   prostate cancer, [place]  . IR FLUORO GUIDE PORT INSERTION RIGHT  07/26/2017  . IR US GUIDE VASC ACCESS RIGHT  07/26/2017     Home Medications:  Prior to Admission medications   Medication Sig Start Date End Date Taking? Authorizing  Provider  acetaminophen (TYLENOL) 500 MG tablet Take 1,000 mg by mouth every 6 (six) hours as needed (for pain.).   Yes [provider]  apixaban (ELIQUIS) 5 MG TABS tablet Take 1 tablet (5 mg total) by mouth 2 (two) times daily. 06/05/18  Yes Camnitz, Will Hassell Done, MD  budesonide-formoterol (SYMBICORT) 160-4.5 MCG/ACT inhaler Inhale 1 puff into the lungs 2 (two) times daily. 02/28/17  Yes [name], MD  carvedilol (COREG) 3.125 MG tablet Take 1 tablet (3.125 mg total) by mouth 2 (two) times daily with a meal. 06/05/18  Yes Crenshaw, Denice Bors, MD  dexamethasone (DECADRON) 4 MG  tablet TAKE 2 TABLET BY MOUTH TWICE DAILY THE DAY BEFORE, DAY OF AND AFTER THE CHEMOTHERAPY EVERY 3 WEEKS 07/19/18  Yes [name], MD  folic acid (FOLVITE) 1 MG tablet TAKE 1 TABLET(1 MG) BY MOUTH DAILY 07/07/18  Yes [name], MD  furosemide (LASIX) 20 MG tablet Take 1 tablet (20 mg total) by mouth daily as needed for fluid or edema. Patient taking differently: Take 20 mg by mouth daily.  06/05/18 07/28/18 Yes Lelon Perla, MD  guaiFENesin (MUCINEX) 600 MG 12 hr tablet Take 600 mg by mouth 2 (two) times daily as needed (for cough/congestion).    Yes [provider]  ibuprofen (ADVIL,MOTRIN) 200 MG tablet Take 800 mg by mouth every 8 (eight) hours as needed (for pain.).   Yes [provider]  lidocaine-prilocaine (EMLA) cream Apply 1 application topically as needed. Squeeze a  small amount of cream to cotton ball and apply to port site 1-2 hours prior to chemotherapy. Do not rub in the cream and cover  with plastic wrap. 07/30/17  Yes [name], MD  losartan (COZAAR) 25 MG tablet Take 1 tablet (25 mg total) by mouth daily. 06/05/18  Yes Lelon Perla, MD  methylPREDNISolone (MEDROL DOSEPAK) 4 MG TBPK tablet 6 x 1 day, 5 x 1 day, 4 x 1 day, 3 x 1 day, 2 x 1 day, 1 x 1 day 07/25/18  Yes Tanner, Lyndon Code., PA-C  mirtazapine (REMERON) 15 MG tablet TAKE 1 TABLET(15 MG) BY MOUTH AT BEDTIME Patient taking differently: Take 15 mg by mouth at bedtime as needed (mood/sleep.).  05/30/18  Yes Owens Shark, NP  omeprazole (PRILOSEC) 20 MG capsule Take 1 capsule (20 mg total) by mouth daily. 06/26/18  Yes Curcio, Roselie Awkward, NP  triamcinolone cream (KENALOG) 0.5 % Apply 1 application topically 3 (three) times daily. 07/25/18  Yes Tanner, Lyndon Code., PA-C  cetirizine (ZYRTEC) 10 MG chewable tablet Chew 10 mg by mouth daily as needed (for seasonal allergies).  02/19/17   [provider]  prochlorperazine (COMPAZINE) 10 MG tablet Take 1 tablet (10 mg total) by mouth every 6  (six) hours as needed for nausea or vomiting. 07/25/18   [name], MD  spironolactone (ALDACTONE) 25 MG tablet Take 0.5 tablets (12.5 mg total) by mouth daily. Patient not taking: Reported on 07/19/2018 05/05/18   Florencia Reasons, MD    Inpatient Medications: Scheduled Meds: . apixaban  5 mg Oral BID  . carvedilol  3.125 mg Oral BID WC  . furosemide  40 mg Intravenous Once  . loratadine  10 mg Oral Daily  . losartan  25 mg Oral Daily  . mometasone-formoterol  2 puff Inhalation BID  . nicotine  14 mg Transdermal Daily  . pantoprazole  40 mg Oral Daily  . spironolactone  12.5 mg Oral Daily   Continuous Infusions: . sodium  chloride 10 mL/hr at 07/29/18 1325  . diltiazem (CARDIZEM) infusion 10 mg/hr (07/29/18 1219)   PRN Meds: acetaminophen, guaiFENesin, levalbuterol, mirtazapine, ondansetron (ZOFRAN) IV, sodium chloride flush  Allergies:    Allergies  Allergen Reactions  . Onion Anaphylaxis       . Cyramza [Ramucirumab] Itching    Itching, erythema, & tearing  . Taxotere [Docetaxel] Itching    Itching, erythema, & tearing    Social History:   Social History   Socioeconomic History  . Marital status: Married    Spouse name: Not on file  . Number of children: Y  . Years of education: Not on file  . Highest education level: Not on file  Occupational History  . Occupation: retired  Scientific laboratory technician  . Financial resource strain: Not on file  . Food insecurity:    Worry: Not on file    Inability: Not on file  . Transportation needs:    Medical: Not on file    Non-medical: Not on file  Tobacco Use  . Smoking status: Heavy Tobacco Smoker    Packs/day: 0.67    Years: 54.00    Pack years: 36.18    Types: Cigarettes    Start date: 01/16/1967    Last attempt to quit: 11/06/2015    Years since quitting: 2.7  . Smokeless tobacco: Never Used  . Tobacco comment: Peak rate of 2ppd; quit on Chantix but started again   Substance and Sexual Activity  . Alcohol use: Yes     Alcohol/week: 1.0 standard drinks    Types: 1 Shots of liquor per week    Comment: 2-3 beers or mixed drinks daily    . Drug use: Yes    Types: Marijuana    Comment: marijuana occasional weekly  . Sexual activity: Yes    Birth control/protection: None  Lifestyle  . Physical activity:    Days per week: Not on file    Minutes per session: Not on file  . Stress: Not on file  Relationships  . Social connections:    Talks on phone: Not on file    Gets together: Not on file    Attends religious service: Not on file    Active member of club or organization: Not on file    Attends meetings of clubs or organizations: Not on file    Relationship status: Not on file  . Intimate partner violence:    Fear of current or ex partner: Not on file    Emotionally abused: Not on file    Physically abused: Not on file    Forced sexual activity: Not on file  Other Topics Concern  . Not on file  Social History Narrative   Lives with wife, dogs, chickens and goats   Occupation: retired.  On disability for herinated discs in back   Edu: HS   Activity: works outside [employer], takes care of farm animals   Diet: good water, fruits/vegetables daily      [facility]:   Originally from [place]. 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 No known asbestos exposure. Questionable mold exposure. Has 2 dogs currently. No recent bird exposure. Previously had chickens.     Family History:    Family History  Problem Relation Age of Onset  . Asthma Father   . COPD Father   . CAD Father        possibly  . Emphysema Father   .  Breast cancer Sister   . Atrial fibrillation Sister   . Hypertension Mother   . Diabetes Neg Hx   . Stroke Neg Hx   . Colon cancer Neg Hx   . Colon polyps Neg Hx   . Rectal cancer Neg Hx   . Stomach cancer Neg Hx      ROS:  Please see the history of present illness.  General:no colds or fevers, no weight  changes Skin:no rashes or ulcers resolved  HEENT:no blurred vision, no congestion CV:see HPI PUL:see HPI GI:no diarrhea constipation or melena, no indigestion GU:no hematuria, no dysuria MS:no joint pain, no claudication Neuro:no syncope, no lightheadedness Endo:no diabetes, no thyroid disease   All other ROS reviewed and negative.     Physical Exam/Data:   Vitals:   07/29/18 0818 07/29/18 1034 07/29/18 1115 07/29/18 1333  BP: 107/85  134/75 110/65  Pulse: 99  96 75  Resp: 18  18 20   Temp: 98 F (36.7 C)   98.5 F (36.9 C)  TempSrc: Oral   Oral  SpO2: 98% 98% 98% 98%  Weight:      Height:        Intake/Output Summary (Last 24 hours) at 07/29/2018 1421 Last data filed at 07/29/2018 1326 Gross per 24 hour  Intake 960 ml  Output 420 ml  Net 540 ml   Last 3 Weights 07/29/2018 07/28/2018 07/19/2018  Weight (lbs) 203 lb 200 lb 6.4 oz 202 lb 8 oz  Weight (kg) 92.08 kg 90.901 kg 91.853 kg     Body mass index is 28.31 kg/m.  General:  Well nourished, well developed, in no acute distress HEENT: normal Lymph: no adenopathy Neck: no JVD Endocrine:  No thryomegaly Vascular: No carotid bruits; pedal pulses 1+ bilaterally  Cardiac:  normal S1, S2;irreg irreg ; no murmur gallup rub or click Lungs:  clear to auscultation bilaterally, no wheezing, rhonchi or rales  Abd: soft, nontender, no hepatomegaly  Ext: +1 lower ext edema improved Musculoskeletal:  No deformities, BUE and BLE strength normal and equal Skin: warm and dry  Neuro:  Alert and oriented X 3 MAE follows commands, no focal abnormalities noted Psych:  Normal affect    Telemetry:  Telemetry was personally reviewed and demonstrates:  A fib rates 95-105 with exertion may go up , + PVCs and NON sustained VT.   Relevant CV Studies: Echo 05/02/18 Study Conclusions  - Left ventricle: The cavity size was normal. Wall thickness was   increased in a pattern of mild LVH. Systolic function was   severely reduced. The  estimated ejection fraction was in the   range of 25% to 30%. Inferior and inferolateral akinesis.   Anterolateral severe hypokinesis. The study was not technically   sufficient to allow evaluation of LV diastolic dysfunction due to   atrial fibrillation. - Aortic valve: Trileaflet; moderately calcified leaflets. There   was no stenosis. - Mitral valve: Mildly calcified annulus. There was mild   regurgitation. - Left atrium: The atrium was mildly dilated. - Right ventricle: The cavity size was normal. Systolic function   was mildly reduced. - Tricuspid valve: Peak RV-RA gradient (S): 39 mm Hg. - Pulmonary arteries: PA peak pressure: 54 mm Hg (S). - Systemic veins: IVC measured 2.4 cm with < 50% respirophasic   variation, suggesting RA pressure 15 mmHg.  Impressions:  - The patient was in atrial fibrillation. Normal LV size with mild   LV hypertrophy. EF 25-30% with wall motion abnormalities as noted  above. Normal RV size with mildly decreased systolic function.   Mild MR. Moderate pulmonary hypertension. Dilated IVC suggestive   of elevated RV filling pressure.  Laboratory Data:  Chemistry Recent Labs  Lab 07/25/18 0748 07/28/18 1211 07/29/18 0547  NA 141 141 141  K 4.0 4.1 3.9  CL 104 103 104  CO2 26 28 29   GLUCOSE 146* 143* 128*  BUN 13 29* 28*  CREATININE 0.75 0.87 0.87  CALCIUM 9.2 9.3 8.9  GFRNONAA >60 >60 >60  GFRAA >60 >60 >60  ANIONGAP 11 10 8     Recent Labs  Lab 07/25/18 0748 07/28/18 1211  PROT 7.0 7.1  ALBUMIN 3.4* 3.9  AST 13* 32  ALT 10 44  ALKPHOS 90 82  BILITOT 0.4 1.0   Hematology Recent Labs  Lab 07/25/18 0748 07/28/18 1211 07/29/18 0547  WBC 7.0 13.9* 11.6*  RBC 3.15* 3.40* 3.32*  HGB 10.3* 11.1* 10.8*  HCT 32.5* 37.0* 35.2*  MCV 103.2* 108.8* 106.0*  MCH 32.7 32.6 32.5  MCHC 31.7 30.0 30.7  RDW 16.0* 16.7* 16.5*  PLT 210 217 207   Cardiac EnzymesNo results for input(s): TROPONINI in the last 168 hours. No results for  input(s): TROPIPOC in the last 168 hours.  BNPNo results for input(s): BNP, PROBNP in the last 168 hours.  DDimer No results for input(s): DDIMER in the last 168 hours.  Radiology/Studies:  Dg Chest Port 1 View  Result Date: 07/28/2018 CLINICAL DATA:  Shortness of breath. EXAM: PORTABLE CHEST 1 VIEW COMPARISON:  Radiographs of May 01, 2018. FINDINGS: Stable cardiomegaly. Right internal jugular Port-A-Cath is unchanged in position. No pneumothorax or pleural effusion is noted. Bony thorax is unremarkable. IMPRESSION: No acute cardiopulmonary abnormality seen. Electronically Signed   By: Marijo Conception, M.D.   On: 07/28/2018 12:22    Assessment and Plan:   1. Atrial flutter/fib rate improved control average 105 on IV dilt at 10, on coreg 3.125 BID,  May increase BB and decrease stop drip.  He has not missed Eliquis, will arrange DCCV tomorrow.  Pt decided not to have ablation.   2. Acute systolic HF with EF 78%, meds at peak due to soft. BP.  rec'd 40 lasix once IV and improved, may need one more dose will defer to Dr. Jerilynn Mages. Ayrianna Mcginniss 3. COPD with continue tobacco dependence.  Stable.  Has been instructed he should stop tobacco. 4. Lung cancer, followed by oncology.  Had been pancytopenic but improved.  Still with anemia.        For questions or updates, please contact [place] Please consult [email or web address] for contact info under     Signed, Cecilie Kicks, NP  07/29/2018 2:21 PM  I have seen and examined the patient along with Cecilie Kicks, NP.  I have reviewed the chart, notes and new data.  I agree with PA/NP's note.  Key new complaints: He is currently not aware of the arrhythmia.  He slept sitting up at the side of the bed last night due to orthopnea.  He is still dyspneic if he tries to lay down.  He has 3+ tense bilateral edema of the lower extremities. Key examination changes: Lower extremity edema, jugular venous distention is present.  Irregular rhythm with widely split  S2. Key new findings / data: Most of the wide-complex beats seen on telemetry are likely due to aberrant conduction, but there is one convincing 6 beat run of nonsustained VT.  Asked Dr. [name] to review all of the tracings and  he thinks they represent atrial flutter.  There is no atrial fibrillation, which makes him a good candidate for ablation.    PLAN: Options for management with cardioversion versus waiting for ablation and the latter is preferable.  Before he can have ablation he needs to be diuresed to be able to lie flat for the procedure.  Tentatively scheduled for RF ablation for atrial flutter on Wednesday early afternoon, with Dr. [name]. This procedure has been fully reviewed with the patient and written informed consent has been obtained. If we can diurese him sufficiently by tomorrow, plan to transfer to [facility] Tuesday evening in anticipation of procedure the next day. Meanwhile, try to transition from intravenous diltiazem to higher dose of oral beta-blocker. If he has a successful ablation, this will allow [place] to discontinue his anticoagulant which will be of great benefit considering his comorbid conditions.   Sanda Klein, MD, [place] [phone] 07/29/2018, 3:38 PM

## 2018-07-29 NOTE — Progress Notes (Signed)
Writer assumed care of this patient at 43.  I agree with the previous nurses assessment. Will continue to monitor closely and follow plan of care.

## 2018-07-30 ENCOUNTER — Inpatient Hospital Stay (HOSPITAL_COMMUNITY): Payer: BLUE CROSS/BLUE SHIELD

## 2018-07-30 ENCOUNTER — Inpatient Hospital Stay (HOSPITAL_COMMUNITY): Payer: BLUE CROSS/BLUE SHIELD | Admitting: Registered Nurse

## 2018-07-30 DIAGNOSIS — C3432 Malignant neoplasm of lower lobe, left bronchus or lung: Secondary | ICD-10-CM

## 2018-07-30 DIAGNOSIS — F17200 Nicotine dependence, unspecified, uncomplicated: Secondary | ICD-10-CM

## 2018-07-30 DIAGNOSIS — I472 Ventricular tachycardia: Secondary | ICD-10-CM

## 2018-07-30 DIAGNOSIS — I361 Nonrheumatic tricuspid (valve) insufficiency: Secondary | ICD-10-CM

## 2018-07-30 DIAGNOSIS — E876 Hypokalemia: Secondary | ICD-10-CM

## 2018-07-30 DIAGNOSIS — I370 Nonrheumatic pulmonary valve stenosis: Secondary | ICD-10-CM

## 2018-07-30 DIAGNOSIS — I483 Typical atrial flutter: Secondary | ICD-10-CM

## 2018-07-30 LAB — BASIC METABOLIC PANEL
Anion gap: 9 (ref 5–15)
BUN: 23 mg/dL (ref 8–23)
CO2: 29 mmol/L (ref 22–32)
Calcium: 8.6 mg/dL — ABNORMAL LOW (ref 8.9–10.3)
Chloride: 103 mmol/L (ref 98–111)
Creatinine, Ser: 0.76 mg/dL (ref 0.61–1.24)
GFR calc Af Amer: >60 mL/min (ref >60)
GFR calc non Af Amer: >60 mL/min (ref >60)
Glucose, Bld: 127 mg/dL — ABNORMAL HIGH (ref 70–99)
Potassium: 3.4 mmol/L — ABNORMAL LOW (ref 3.5–5.1)
Sodium: 141 mmol/L (ref 135–145)

## 2018-07-30 LAB — CBC
HCT: 34 % — ABNORMAL LOW (ref 39.0–52.0)
Hemoglobin: 10.4 g/dL — ABNORMAL LOW (ref 13.0–17.0)
MCH: 33.1 pg (ref 26.0–34.0)
MCHC: 30.6 g/dL (ref 30.0–36.0)
MCV: 108.3 fL — ABNORMAL HIGH (ref 80.0–100.0)
Platelets: 172 10*3/uL (ref 150–400)
RBC: 3.14 MIL/uL — ABNORMAL LOW (ref 4.22–5.81)
RDW: 16.5 % — ABNORMAL HIGH (ref 11.5–15.5)
WBC: 11.5 10*3/uL — ABNORMAL HIGH (ref 4.0–10.5)
nRBC: 0 % (ref 0.0–0.2)

## 2018-07-30 LAB — TROPONIN I
Troponin I: 0.07 ng/mL (ref <0.03)
Troponin I: 0.1 ng/mL (ref <0.03)
Troponin I: 0.09 ng/mL (ref <0.03)

## 2018-07-30 LAB — GLUCOSE, CAPILLARY: Glucose-Capillary: 129 mg/dL — ABNORMAL HIGH (ref 70–99)

## 2018-07-30 LAB — ECHOCARDIOGRAM COMPLETE
Height: 71 in
Weight: 3185.21 oz

## 2018-07-30 LAB — MAGNESIUM: Magnesium: 2 mg/dL (ref 1.7–2.4)

## 2018-07-30 LAB — MRSA PCR SCREENING: MRSA by PCR: NEGATIVE

## 2018-07-30 MED ORDER — AMIODARONE HCL IN DEXTROSE 360-4.14 MG/200ML-% IV SOLN
INTRAVENOUS | Status: AC
  Filled 2018-07-30: qty 200

## 2018-07-30 MED ORDER — POTASSIUM CHLORIDE CRYS ER 20 MEQ PO TBCR
20.0000 meq | EXTENDED_RELEASE_TABLET | Freq: Two times a day (BID) | ORAL | Status: AC
  Administered 2018-07-30 (×2): 20 meq via ORAL
  Filled 2018-07-30 (×2): qty 1

## 2018-07-30 MED ORDER — FUROSEMIDE 10 MG/ML IJ SOLN
40.0000 mg | Freq: Once | INTRAMUSCULAR | Status: AC
  Administered 2018-07-30: 40 mg via INTRAVENOUS
  Filled 2018-07-30: qty 4

## 2018-07-30 MED ORDER — SPIRONOLACTONE 25 MG PO TABS
25.0000 mg | ORAL_TABLET | Freq: Every day | ORAL | Status: DC
  Administered 2018-07-30 – 2018-08-05 (×7): 25 mg via ORAL
  Filled 2018-07-30 (×7): qty 1

## 2018-07-30 MED ORDER — AMIODARONE HCL IN DEXTROSE 360-4.14 MG/200ML-% IV SOLN
60.0000 mg/h | INTRAVENOUS | Status: AC
  Administered 2018-07-30: 60 mg/h via INTRAVENOUS
  Filled 2018-07-30: qty 200

## 2018-07-30 MED ORDER — MORPHINE SULFATE (PF) 2 MG/ML IV SOLN
2.0000 mg | INTRAVENOUS | Status: DC | PRN
  Administered 2018-07-30 – 2018-08-04 (×8): 2 mg via INTRAVENOUS
  Filled 2018-07-30 (×8): qty 1

## 2018-07-30 MED ORDER — OXYCODONE HCL 5 MG PO TABS
5.0000 mg | ORAL_TABLET | Freq: Four times a day (QID) | ORAL | Status: DC | PRN
  Administered 2018-07-30 – 2018-08-06 (×18): 5 mg via ORAL
  Filled 2018-07-30 (×18): qty 1

## 2018-07-30 MED ORDER — AMIODARONE HCL IN DEXTROSE 360-4.14 MG/200ML-% IV SOLN
30.0000 mg/h | INTRAVENOUS | Status: DC
  Administered 2018-07-30 – 2018-07-31 (×3): 30 mg/h via INTRAVENOUS
  Filled 2018-07-30 (×2): qty 200

## 2018-07-30 MED FILL — Medication: Qty: 1 | Status: AC

## 2018-07-30 NOTE — Progress Notes (Signed)
Progress Note  Patient Name: Brian White Date of Encounter: 07/30/2018  Primary Cardiologist: Kirk Ruths, MD   Subjective   Around 10 PM last night had a 10-second episode of asymptomatic sustained monomorphic VT. Around 3 AM had sustained monomorphic VT about 200 bpm and was found to be pulseless and agonal.  Recovered spontaneous circulation after a single fibrillatory shock and intravenous amiodarone bolus.  Regained consciousness after a few minutes.  The atrial flutter also converted following his defibrillation.  He has maintained sinus rhythm since then, with frequent PVCs. Is now alert, oriented and conversant.  Has some anterior chest soreness worsened by deep breathing and cough consistent with musculoskeletal pain following chest compressions. Denies experiencing any chest pain prior to the episode of VT.  Does not have anginal chest pain at this time.  Denies any change in his pattern of shortness of breath.  Inpatient Medications    Scheduled Meds: . apixaban  5 mg Oral BID  . carvedilol  3.125 mg Oral BID WC  . furosemide  40 mg Intravenous Once  . loratadine  10 mg Oral Daily  . losartan  25 mg Oral Daily  . mometasone-formoterol  2 puff Inhalation BID  . nicotine  14 mg Transdermal Daily  . pantoprazole  40 mg Oral Daily  . potassium chloride  20 mEq Oral BID  . spironolactone  25 mg Oral Daily   Continuous Infusions: . sodium chloride 20 mL/hr at 07/29/18 2200  . amiodarone 60 mg/hr (07/30/18 0418)  . amiodarone    . amiodarone     PRN Meds: acetaminophen, guaiFENesin, levalbuterol, mirtazapine, ondansetron (ZOFRAN) IV, sodium chloride flush   Vital Signs    Vitals:   07/30/18 0500 07/30/18 0600 07/30/18 0745 07/30/18 0824  BP: 120/72 119/76    Pulse: (!) 55 (!) 58 66   Resp: (!) 21 20 14    Temp:    97.8 F (36.6 C)  TempSrc:    Oral  SpO2: 100% 100% 100%   Weight:      Height:        Intake/Output Summary (Last 24 hours) at 07/30/2018  0836 Last data filed at 07/30/2018 0200 Gross per 24 hour  Intake 937.04 ml  Output 1950 ml  Net -1012.96 ml   Last 3 Weights 07/30/2018 07/29/2018 07/28/2018  Weight (lbs) 199 lb 1.2 oz 203 lb 200 lb 6.4 oz  Weight (kg) 90.3 kg 92.08 kg 90.901 kg      Telemetry    Sinus rhythm with PVCs and PACs- Personally Reviewed  ECG    Sinus rhythm with occasional ectopy, atypical intraventricular conduction delay QRS >130 ms, ST depression and T wave inversion leads I, aVL, V2-V6, normal QTC 424 ms - personally Reviewed  Physical Exam  Fully alert and oriented GEN: No acute distress.   Neck: -8 cm JVD Cardiac: RRR with occasional ectopy, no murmurs, rubs, or gallops.  Respiratory:  Diminished breath sounds throughout with prolonged expiration but no overt wheezing GI: Soft, nontender, non-distended  MS: 2-3+ symmetrical pedal edema; No deformity. Neuro:  Nonfocal  Psych: Normal affect   Labs    Chemistry Recent Labs  Lab 07/25/18 0748 07/28/18 1211 07/29/18 0547 07/30/18 0314  NA 141 141 141 141  K 4.0 4.1 3.9 3.4*  CL 104 103 104 103  CO2 26 28 29 29   GLUCOSE 146* 143* 128* 127*  BUN 13 29* 28* 23  CREATININE 0.75 0.87 0.87 0.76  CALCIUM 9.2 9.3 8.9 8.6*  PROT 7.0 7.1  --   --   ALBUMIN 3.4* 3.9  --   --   AST 13* 32  --   --   ALT 10 44  --   --   ALKPHOS 90 82  --   --   BILITOT 0.4 1.0  --   --   GFRNONAA >60 >60 >60 >60  GFRAA >60 >60 >60 >60  ANIONGAP 11 10 8 9      Hematology Recent Labs  Lab 07/28/18 1211 07/29/18 0547 07/30/18 0314  WBC 13.9* 11.6* 11.5*  RBC 3.40* 3.32* 3.14*  HGB 11.1* 10.8* 10.4*  HCT 37.0* 35.2* 34.0*  MCV 108.8* 106.0* 108.3*  MCH 32.6 32.5 33.1  MCHC 30.0 30.7 30.6  RDW 16.7* 16.5* 16.5*  PLT 217 207 172    Cardiac Enzymes Recent Labs  Lab 07/30/18 0418  TROPONINI 0.07*   No results for input(s): TROPIPOC in the last 168 hours.   BNPNo results for input(s): BNP, PROBNP in the last 168 hours.   DDimer No results for  input(s): DDIMER in the last 168 hours.   Radiology    Dg Chest 1 View  Result Date: 07/30/2018 CLINICAL DATA:  Shortness of breath EXAM: CHEST  1 VIEW COMPARISON:  Two days ago FINDINGS: Chronic cardiomegaly. Porta catheter on the right with tip the level of the right brachiocephalic. There is no edema, consolidation, effusion, or pneumothorax. IMPRESSION: No evidence of active disease. Electronically Signed   By: Monte Fantasia M.D.   On: 07/30/2018 04:22   Dg Chest Port 1 View  Result Date: 07/28/2018 CLINICAL DATA:  Shortness of breath. EXAM: PORTABLE CHEST 1 VIEW COMPARISON:  Radiographs of May 01, 2018. FINDINGS: Stable cardiomegaly. Right internal jugular Port-A-Cath is unchanged in position. No pneumothorax or pleural effusion is noted. Bony thorax is unremarkable. IMPRESSION: No acute cardiopulmonary abnormality seen. Electronically Signed   By: Marijo Conception, M.D.   On: 07/28/2018 12:22    Cardiac Studies   Echo 05/02/18 Study Conclusions  - Left ventricle: The cavity size was normal. Wall thickness was increased in a pattern of mild LVH. Systolic function was severely reduced. The estimated ejection fraction was in the range of 25% to 30%. Inferior and inferolateral akinesis. Anterolateral severe hypokinesis. The study was not technically sufficient to allow evaluation of LV diastolic dysfunction due to atrial fibrillation. - Aortic valve: Trileaflet; moderately calcified leaflets. There was no stenosis. - Mitral valve: Mildly calcified annulus. There was mild regurgitation. - Left atrium: The atrium was mildly dilated. - Right ventricle: The cavity size was normal. Systolic function was mildly reduced. - Tricuspid valve: Peak RV-RA gradient (S): 39 mm Hg. - Pulmonary arteries: PA peak pressure: 54 mm Hg (S). - Systemic veins: IVC measured 2.4 cm with < 50% respirophasic variation, suggesting RA pressure 15 mmHg.  Impressions:  - The  patient was in atrial fibrillation. Normal LV size with mild LV hypertrophy. EF 25-30% with wall motion abnormalities as noted above. Normal RV size with mildly decreased systolic function. Mild MR. Moderate pulmonary hypertension. Dilated IVC suggestive of elevated RV filling pressure.  Patient Profile     68 y.o. male with persistent atrial flutter, really depressed left ventricular systolic function of uncertain etiology, COPD, lung cancer, admitted on January 12 with acute on chronic heart failure exacerbation and developed sustained ventricular tachycardia resolved with a single defibrillatory shock early morning January 14.  Assessment & Plan    1. VT: He is the middle of  on episode of heart failure exacerbation which is probably the single most important contributing factor.  Also had mild hypokalemia this morning which is being replaced.  The occurrence of VT increases the concern for underlying ischemic heart disease and at some point he should undergo cardiac catheterization.  Repeat echocardiogram is scheduled already.  Continue intravenous amiodarone for 24 hours and switch to oral amiodarone 400 mg daily tomorrow morning if he does not have any further dangerous arrhythmia.  Discussed the potential side effects and the need for close monitoring while on amiodarone therapy.  Keep in ICU for next 24 hours please. 2. AFlutter: Resolved with a defibrillator shock for VT.  We will cancel plans for flutter ablation later this week.  Continue Eliquis anticoagulation.  Amiodarone should help reduce the likelihood of flutter recurrence. 3. CHF: Remains markedly hypervolemic.  Increase spironolactone.  Give another dose of intravenous furosemide today while carefully watching electrolytes.  He has to be able to lie flat before we can consider cardiac catheterization. 4. Lung Ca: He did not tolerate the most recent choice for chemotherapy and at this point is uncertain what his next  treatment course will be.  Need to keep oncology involved in the decision-making process.  I do not think he would be a candidate for bypass surgery if he should have multivessel CAD, for example. 5. COPD: Potential for worsening lung status with amiodarone reviewed.  This is an unlikely occurrence and we do not have any other good choices for antiarrhythmics for this patient.  CRITICAL CARE Performed by: Dani Gobble Katarzyna Wolven   Total critical care time: 45 minutes  Critical care time was exclusive of separately billable procedures and treating other patients.  Critical care was necessary to treat or prevent imminent or life-threatening deterioration.  Critical care was time spent personally by me on the following activities: development of treatment plan with patient and/or surrogate as well as nursing, discussions with consultants, evaluation of patient's response to treatment, examination of patient, obtaining history from patient or surrogate, ordering and performing treatments and interventions, ordering and review of laboratory studies, ordering and review of radiographic studies, pulse oximetry and re-evaluation of patient's condition.      For questions or updates, please contact [place] Please consult [email or web address] for contact info under        Signed, Sanda Klein, MD  07/30/2018, 8:36 AM

## 2018-07-30 NOTE — Progress Notes (Signed)
[facility] PROGRESS NOTE  Brian White CXK:[id number] DOB: 15-Mar-1951 DOA: 07/28/2018  PCP: [name], MD  Brief History/Interval Summary: 68 y.o. male with medical history significant of stage IIIb lung cancer, chronic systolic heart failure with an EF of 25%, atrial fibrillation/flutter on chronic anticoagulation, hypertension, COPD with continued smoking of approximately 1 pack every 2 days, who presented to the ER reporting shortness of breath progressively worse over the last 24 hours. In the ER patient was found to be in A. Fib/flutter with RVR in 130s to 140s.    Patient was started on a diltiazem infusion and was hospitalized for further management.  Patient was seen by cardiology.  Plan was to do ablation.  However on the night of 1/13 patient went into ventricular tachycardia and arrested.  He was successfully resuscitated.  He was placed on amiodarone.  Reason for Visit: Atrial flutter with RVR  Consultants: Cardiology  Procedures: None yet  Antibiotics: None  Subjective/Interval History: Overnight events noted.  Patient complains of feeling sore on his chest this morning.  Has some difficulty breathing.  Leg swelling is about the same.  His wife is at the bedside.    ROS: Denies any headaches.  Objective:  Vital Signs  Vitals:   07/30/18 0824 07/30/18 0915 07/30/18 0918 07/30/18 1147  BP:      Pulse:  61 65   Resp:  (!) 0    Temp: 97.8 F (36.6 C)   98.4 F (36.9 C)  TempSrc: Oral   Oral  SpO2:  97%    Weight:      Height:        Intake/Output Summary (Last 24 hours) at 07/30/2018 1159 Last data filed at 07/30/2018 0900 Gross per 24 hour  Intake 1074.66 ml  Output 1950 ml  Net -875.34 ml   Filed Weights   07/28/18 1529 07/29/18 0505 07/30/18 0417  Weight: 90.9 kg 92.1 kg 90.3 kg   General appearance: Awake alert.  In no distress Resp: Normal effort at rest.  Coarse breath sounds bilaterally without any crackles wheezing or  rhonchi. Cardio: S1-S2 is normal regular.  No S3-S4.  No rubs murmurs or bruit GI: Abdomen is soft.  Nontender nondistended.  Bowel sounds are present normal.  No masses organomegaly Extremities: No edema.  Full range of motion of lower extremities. Neurologic: Alert and oriented x3.  No focal neurological deficits.    Lab Results:  Data Reviewed: I have personally reviewed following labs and imaging studies  CBC: Recent Labs  Lab 07/25/18 0748 07/28/18 1211 07/29/18 0547 07/30/18 0314  WBC 7.0 13.9* 11.6* 11.5*  NEUTROABS 5.9  --   --   --   HGB 10.3* 11.1* 10.8* 10.4*  HCT 32.5* 37.0* 35.2* 34.0*  MCV 103.2* 108.8* 106.0* 108.3*  PLT 210 217 207 970    Basic Metabolic Panel: Recent Labs  Lab 07/25/18 0748 07/28/18 1211 07/29/18 0547 07/29/18 2301 07/30/18 0314  NA 141 141 141  --  141  K 4.0 4.1 3.9  --  3.4*  CL 104 103 104  --  103  CO2 26 28 29   --  29  GLUCOSE 146* 143* 128*  --  127*  BUN 13 29* 28*  --  23  CREATININE 0.75 0.87 0.87  --  0.76  CALCIUM 9.2 9.3 8.9  --  8.6*  MG  --   --   --  2.0  --     GFR: Estimated Creatinine Clearance: 96.7  mL/min (by C-G formula based on SCr of 0.76 mg/dL).  Liver Function Tests: Recent Labs  Lab 07/25/18 0748 07/28/18 1211  AST 13* 32  ALT 10 44  ALKPHOS 90 82  BILITOT 0.4 1.0  PROT 7.0 7.1  ALBUMIN 3.4* 3.9    Thyroid Function Tests: Recent Labs    07/29/18 0547  TSH 0.568     Radiology Studies: Dg Chest 1 View  Result Date: 07/30/2018 CLINICAL DATA:  Shortness of breath EXAM: CHEST  1 VIEW COMPARISON:  Two days ago FINDINGS: Chronic cardiomegaly. Porta catheter on the right with tip the level of the right brachiocephalic. There is no edema, consolidation, effusion, or pneumothorax. IMPRESSION: No evidence of active disease. Electronically Signed   By: Monte Fantasia M.D.   On: 07/30/2018 04:22     Medications:  Scheduled: . apixaban  5 mg Oral BID  . carvedilol  3.125 mg Oral BID WC  .  loratadine  10 mg Oral Daily  . losartan  25 mg Oral Daily  . mometasone-formoterol  2 puff Inhalation BID  . nicotine  14 mg Transdermal Daily  . pantoprazole  40 mg Oral Daily  . potassium chloride  20 mEq Oral BID  . spironolactone  25 mg Oral Daily   Continuous: . sodium chloride 20 mL/hr at 07/30/18 0900  . amiodarone 30 mg/hr (07/30/18 0925)  . amiodarone     TDD:[id number], guaiFENesin, levalbuterol, mirtazapine, morphine injection, ondansetron (ZOFRAN) IV, oxyCODONE, sodium chloride flush    Assessment/Plan:  Cardia status post cardiac arrest Patient arrested on the night of 1/13 after going into ventricular tachycardia.  He was defibrillated with which he regained a pulse.  He is in sinus rhythm this morning.  He was placed on amiodarone infusion.  Cardiology is following.  Replace potassium.  Atrial flutter/atrial fibrillation with RVR Patient was placed on diltiazem infusion at the time of admission.  Heart rate was reasonably well controlled.  Cardiology was consulted.  TSH 0.568.  Patient anticoagulated with apixaban.  Ablation was being considered however patient went into ventricular tachycardia as discussed above.  Patient now in sinus rhythm.  Ablation has been canceled.  Acute on chronic systolic CHF EF known to be 25%.  Does not have any overt pulmonary edema but is quite volume overloaded.  He has significant lower extremity edema.  Admitted with weight of 92.1Kg. Weight was 87.8 kg in November and 91.1 kg in December.  Patient was placed on intravenous furosemide.  His weight has decreased to 90.3 kg today.  Continue IV Lasix for now.  Strict ins and outs and daily weights.  Patient on spironolactone and beta-blocker.  Also on ARB.  History of COPD with tobacco dependence No exacerbation at this time.  Unfortunately patient continues to smoke cigarettes. Saturating normal on room air.  Xopenex as needed.  Essential hypertension Monitor blood pressure closely.   Patient is on carvedilol, losartan.  Lung cancer Followed by oncology at the cancer center.  Seen on 1/9.  Patient developed reaction to Cyramza and some flushing to Taxotere.  Further management as outpatient.  Followed by Dr. Julien Nordmann.  History of GERD Continue PPI  Tobacco dependence Unfortunately patient continues to smoke cigarettes.  Nicotine patch.  Macrocytic anemia Hemoglobin stable.  No evidence of overt bleeding.  Hypokalemia Will be repleted.  Magnesium was 2.0.  DVT Prophylaxis: Eliquis    Code Status: Full code Family Communication: Discussed with the patient and his wife Disposition Plan: Management as outlined above.  LOS: 1 day   Maisley Hainsworth [employer] on [email or web address]  07/30/2018, 11:59 AM

## 2018-07-30 NOTE — Progress Notes (Signed)
  Echocardiogram 2D Echocardiogram has been performed.  Brian White 07/30/2018, 2:10 PM

## 2018-07-30 NOTE — Progress Notes (Addendum)
Tele Monitor showed V Tact. Went into assessed Patient and found him unresponsive in bed. Initiated Code blue.

## 2018-07-30 NOTE — Progress Notes (Signed)
CRITICAL VALUE ALERT  Critical Value:  Troponin 0.07  Date & Time Notied:  07/30/18 0520  Provider Notified: Lamar Blinks  Orders Received/Actions taken: Awaiting orders

## 2018-07-30 NOTE — Progress Notes (Signed)
[facility] paged in regards to post-code and cardizem drip being changed to amiodarone drip.

## 2018-07-30 NOTE — ED Provider Notes (Signed)
07/30/18 3:50 AM  Called to patient's room for CODE BLUE.  Patient had been admitted for rapid atrial fibrillation and was on a Cardizem drip.  Reportedly he had had several brief runs of ventricular tachycardia earlier this morning.  He went into ventricular tachycardia and CPR was initiated.  On my arrival he was pulseless with V. tach on the monitor.  He had agonal respirations and was being bag-valve-mask ventilated by respiratory therapy.  I instructed that he be defibrillated and he was shocked one time at 120 J.  There was return of spontaneous circulation.  His diltiazem drip was discontinued and he was given 150 mg of amiodarone over 10 minutes followed by an amiodarone drip 1 mg/min.  Within several minutes the patient was able to speak.  He sat upright on the side of the bed and was placed on a nonrebreather.  Oxygen saturation was 100% and he denied being short of breath.  He was noted to have expiratory rhonchi on exam.  He was then taken to the ICU where a chest x-ray will be obtained.  Chaney Malling, NP with the hospitalist service accompanied him.     Therma Lasure, Jenny Reichmann, MD 07/30/18 [phone]

## 2018-07-30 NOTE — Progress Notes (Signed)
Patient had 10 seconds of V-tact. Patient denies any chest pain or dizziness and vitals are stable. On call Provider has been notified of cardiac episode. Awaiting new orders will continue to monitor patient closely.  Patient insisting on walking to bathroom to urinate instead of using beside urinal. Patient has been educated about the safety risk.

## 2018-07-31 ENCOUNTER — Inpatient Hospital Stay: Payer: BLUE CROSS/BLUE SHIELD

## 2018-07-31 DIAGNOSIS — I472 Ventricular tachycardia, unspecified: Secondary | ICD-10-CM

## 2018-07-31 DIAGNOSIS — E785 Hyperlipidemia, unspecified: Secondary | ICD-10-CM

## 2018-07-31 DIAGNOSIS — I429 Cardiomyopathy, unspecified: Secondary | ICD-10-CM

## 2018-07-31 LAB — CBC
HCT: 33.2 % — ABNORMAL LOW (ref 39.0–52.0)
Hemoglobin: 10.2 g/dL — ABNORMAL LOW (ref 13.0–17.0)
MCH: 33.3 pg (ref 26.0–34.0)
MCHC: 30.7 g/dL (ref 30.0–36.0)
MCV: 108.5 fL — ABNORMAL HIGH (ref 80.0–100.0)
Platelets: 150 10*3/uL (ref 150–400)
RBC: 3.06 MIL/uL — ABNORMAL LOW (ref 4.22–5.81)
RDW: 16.6 % — ABNORMAL HIGH (ref 11.5–15.5)
WBC: 10.4 10*3/uL (ref 4.0–10.5)
nRBC: 0 % (ref 0.0–0.2)

## 2018-07-31 LAB — BASIC METABOLIC PANEL
Anion gap: 9 (ref 5–15)
BUN: 20 mg/dL (ref 8–23)
CO2: 30 mmol/L (ref 22–32)
Calcium: 8.7 mg/dL — ABNORMAL LOW (ref 8.9–10.3)
Chloride: 101 mmol/L (ref 98–111)
Creatinine, Ser: 0.74 mg/dL (ref 0.61–1.24)
GFR calc Af Amer: >60 mL/min (ref >60)
GFR calc non Af Amer: >60 mL/min (ref >60)
Glucose, Bld: 108 mg/dL — ABNORMAL HIGH (ref 70–99)
Potassium: 4.2 mmol/L (ref 3.5–5.1)
Sodium: 140 mmol/L (ref 135–145)

## 2018-07-31 LAB — MAGNESIUM: Magnesium: 2 mg/dL (ref 1.7–2.4)

## 2018-07-31 MED ORDER — FUROSEMIDE 10 MG/ML IJ SOLN
40.0000 mg | Freq: Two times a day (BID) | INTRAMUSCULAR | Status: DC
  Administered 2018-08-01: 40 mg via INTRAVENOUS
  Filled 2018-07-31: qty 4

## 2018-07-31 MED ORDER — LEVALBUTEROL HCL 0.63 MG/3ML IN NEBU
0.6300 mg | INHALATION_SOLUTION | Freq: Four times a day (QID) | RESPIRATORY_TRACT | Status: DC
  Administered 2018-07-31 – 2018-08-03 (×15): 0.63 mg via RESPIRATORY_TRACT
  Filled 2018-07-31 (×15): qty 3

## 2018-07-31 MED ORDER — AMIODARONE HCL 200 MG PO TABS
400.0000 mg | ORAL_TABLET | Freq: Every day | ORAL | Status: DC
  Administered 2018-08-07: 400 mg via ORAL
  Filled 2018-07-31: qty 2

## 2018-07-31 MED ORDER — FUROSEMIDE 10 MG/ML IJ SOLN
40.0000 mg | Freq: Two times a day (BID) | INTRAMUSCULAR | Status: DC
  Administered 2018-07-31: 40 mg via INTRAVENOUS
  Filled 2018-07-31: qty 4

## 2018-07-31 MED ORDER — MAGNESIUM HYDROXIDE 400 MG/5ML PO SUSP: 15.0000 mL | Freq: Every day | ORAL | Status: DC | PRN

## 2018-07-31 MED ORDER — AMIODARONE HCL 200 MG PO TABS: 400.0000 mg | ORAL_TABLET | Freq: Two times a day (BID) | ORAL | Status: DC

## 2018-07-31 MED ORDER — AMIODARONE HCL 200 MG PO TABS
400.0000 mg | ORAL_TABLET | Freq: Two times a day (BID) | ORAL | Status: AC
  Administered 2018-07-31 – 2018-08-07 (×11): 400 mg via ORAL
  Filled 2018-07-31 (×11): qty 2

## 2018-07-31 NOTE — Progress Notes (Signed)
Progress Note  Patient Name: Brian White Date of Encounter: 07/31/2018  Primary Cardiologist: Kirk Ruths, MD   Subjective   Remains in sinus rhythm with frequent PACs and PVCs, but has not had atrial flutter or ventricular tachycardia. Mediocre net diuresis in last 24 hours. Breathing is a little better and he can now lie flat, but has to sit up after at the most 40-45 minutes.  Still has prominent bilateral pedal edema. Renal function is normal and potassium is now in normal range. Follow-up echocardiogram does not show any improvement in left ventricular systolic function which remains severely depressed with EF 25%.  Inpatient Medications    Scheduled Meds: . apixaban  5 mg Oral BID  . carvedilol  3.125 mg Oral BID WC  . furosemide  40 mg Intravenous Q12H  . levalbuterol  0.63 mg Nebulization Q6H  . loratadine  10 mg Oral Daily  . losartan  25 mg Oral Daily  . mometasone-formoterol  2 puff Inhalation BID  . nicotine  14 mg Transdermal Daily  . pantoprazole  40 mg Oral Daily  . spironolactone  25 mg Oral Daily   Continuous Infusions: . sodium chloride 20 mL/hr at 07/31/18 1302  . amiodarone 30 mg/hr (07/31/18 1303)   PRN Meds: acetaminophen, guaiFENesin, mirtazapine, morphine injection, ondansetron (ZOFRAN) IV, oxyCODONE, sodium chloride flush   Vital Signs    Vitals:   07/31/18 1100 07/31/18 1200 07/31/18 1300 07/31/18 1400  BP: (!) 128/94 132/84 127/69 (!) 162/78  Pulse: 60 63 (!) 58 71  Resp: (!) 21 20 19 17   Temp:      TempSrc:      SpO2: 91% 94% 93% 95%  Weight:      Height:        Intake/Output Summary (Last 24 hours) at 07/31/2018 1548 Last data filed at 07/31/2018 1422 Gross per 24 hour  Intake 759.48 ml  Output 650 ml  Net 109.48 ml   Last 3 Weights 07/30/2018 07/29/2018 07/28/2018  Weight (lbs) 199 lb 1.2 oz 203 lb 200 lb 6.4 oz  Weight (kg) 90.3 kg 92.08 kg 90.901 kg      Telemetry    Sinus rhythm with frequent atrial and ventricular  ectopy, no sustained atrial or ventricular arrhythmia- Personally Reviewed  ECG    No new tracing- Personally Reviewed  Physical Exam  Appears comfortable while sitting upright, restless when lying flat GEN: No acute distress.   Neck:  10 cm JVD Cardiac: RRR, no murmurs, rubs, or gallops.  Respiratory:  Breath sounds diminished throughout, occasional rhonchi, minimal wheezing GI: Soft, nontender, non-distended  MS:  Tense symmetrical 3+ bilateral pedal and ankle edema; No deformity. Neuro:  Nonfocal  Psych: Normal affect   Labs    Chemistry Recent Labs  Lab 07/25/18 0748  07/28/18 1211 07/29/18 0547 07/30/18 0314 07/31/18 0551  NA 141  --  141 141 141 140  K 4.0  --  4.1 3.9 3.4* 4.2  CL 104  --  103 104 103 101  CO2 26  --  28 29 29 30   GLUCOSE 146*  --  143* 128* 127* 108*  BUN 13  --  29* 28* 23 20  CREATININE 0.75   < > 0.87 0.87 0.76 0.74  CALCIUM 9.2  --  9.3 8.9 8.6* 8.7*  PROT 7.0  --  7.1  --   --   --   ALBUMIN 3.4*  --  3.9  --   --   --  AST 13*  --  32  --   --   --   ALT 10  --  44  --   --   --   ALKPHOS 90  --  82  --   --   --   BILITOT 0.4  --  1.0  --   --   --   GFRNONAA >60   < > >60 >60 >60 >60  GFRAA >60   < > >60 >60 >60 >60  ANIONGAP 11  --  10 8 9 9    < > = values in this interval not displayed.     Hematology Recent Labs  Lab 07/29/18 0547 07/30/18 0314 07/31/18 0551  WBC 11.6* 11.5* 10.4  RBC 3.32* 3.14* 3.06*  HGB 10.8* 10.4* 10.2*  HCT 35.2* 34.0* 33.2*  MCV 106.0* 108.3* 108.5*  MCH 32.5 33.1 33.3  MCHC 30.7 30.6 30.7  RDW 16.5* 16.5* 16.6*  PLT 207 172 150    Cardiac Enzymes Recent Labs  Lab 07/30/18 0418 07/30/18 1018 07/30/18 1904  TROPONINI 0.07* 0.10* 0.09*   No results for input(s): TROPIPOC in the last 168 hours.   BNPNo results for input(s): BNP, PROBNP in the last 168 hours.   DDimer No results for input(s): DDIMER in the last 168 hours.   Radiology    Dg Chest 1 View  Result Date:  07/30/2018 CLINICAL DATA:  Shortness of breath EXAM: CHEST  1 VIEW COMPARISON:  Two days ago FINDINGS: Chronic cardiomegaly. Porta catheter on the right with tip the level of the right brachiocephalic. There is no edema, consolidation, effusion, or pneumothorax. IMPRESSION: No evidence of active disease. Electronically Signed   By: Monte Fantasia M.D.   On: 07/30/2018 04:22    Cardiac Studies   Echo July 30, 2018 - Left ventricle: Diffuse hypokinesis worse in the inferior wall.   The cavity size was moderately dilated. Wall thickness was   normal. The estimated ejection fraction was 25%. The study is not   technically sufficient to allow evaluation of LV diastolic   function. - Aortic valve: Sclerosis without stenosis. - Mitral valve: Calcified annulus. Mildly thickened leaflets . - Left atrium: The atrium was moderately dilated. - Atrial septum: No defect or patent foramen ovale was identified.  Patient Profile     68 y.o. male with persistent atrial flutter,  severely depressed left ventricular systolic function of uncertain etiology, COPD, lung cancer, admitted on January 12 with acute on chronic heart failure exacerbation and developed in-hospital cardiac arrest/sustained ventricular tachycardia resolved with a single defibrillatory shock early morning January 14.  The defibrillator shock also resolved the atrial flutter.  Assessment & Plan    1. VT:  No recurrence of intravenous amiodarone.  He has had more than 24 hours of intravenous medication and will switch him to amiodarone 400 mg twice daily for 1 week followed by 400 mg daily.  Acute heart failure and hypokalemia are likely the precipitating factors, but the occurrence of VT raises the suspicion for underlying CAD further.  Once his heart failure is better compensated he will need cardiac catheterization. 2. AFlutter: Resolved with defibrillator shock for VT.  Continue Eliquis anticoagulation, will hold for only 2 doses  before cardiac catheterization in view of the recent rhythm conversion.  Amiodarone should help reduce the likelihood of flutter recurrence. 3. CHF:  Still severely hypervolemic.    Potassium is now normal and allows more aggressive diuresis.  He has to be able to lie  flat before we can consider cardiac catheterization.  Hopefully we can get him there by Friday morning. 4. Lung Ca: He did not tolerate the most recent choice for chemotherapy and at this point is uncertain what his next treatment course will be.  Need to keep oncology involved in the decision-making process.  For example, need to be aware of the multiple possible amiodarone drug interactions. I do not think he would be a candidate for bypass surgery if he should have multivessel CAD.  It is possible he could undergo angioplasty/stents. 5. COPD: Potential for worsening lung status with amiodarone reviewed.  This is an unlikely occurrence and we do not have any other good choices for antiarrhythmics for this patient.      For questions or updates, please contact [facility] Please consult [email or web address] for contact info under        Signed, Sanda Klein, MD  07/31/2018, 3:48 PM

## 2018-07-31 NOTE — Progress Notes (Signed)
PROGRESS NOTE    Brian White  DHR:[id number] DOB: [birth year] DOA: 07/28/2018 PCP: [name], MD   Brief Narrative:  68 year old with past medical history relevant for stage IIIb lung cancer, chronic systolic heart failure with EF of 25% (echo on 07/30/2018, wall motion abnormality on inferior wall), paroxysmal atrial fibrillation/flutter on apixaban, hypertension, COPD with chronic tobacco abuse who presented with atrial fibrillation with rapid ventricular response and subsequently developed ventricular tachycardia with arrest status post defibrillation and initiation of amiodarone drip.   Assessment & Plan:   Principal Problem:   Atrial fibrillation (HCC) Active Problems:   HTN (hypertension)   COPD (chronic obstructive pulmonary disease) (HCC)   Smoker   HLD (hyperlipidemia)   Cancer of lower lobe of left lung (HCC)   Anticoagulated   GERD (gastroesophageal reflux disease)   Chronic systolic CHF (congestive heart failure) (HCC)   A-fib (HCC)   #) VT arrest: This was in the setting of being on a diltiazem drip for atrial fibrillation with rapid response.  He is doing well on amiodarone.  Per cardiology it is felt that the primary exacerbating factor was a heart failure exacerbation. - Echo performed on 07/31/2016 shows wall motion abnormalities -Cardiology consulted, appreciate recommendations -Diuresis per below -Continue amiodarone load, plan to transition to 400 mg by mouth daily -Keep potassium greater than 4 and magnesium greater than 2, telemetry  #) Acute on chronic systolic heart failure exacerbation: Patient has significant orthopnea and bilateral lower extremity edema.  Echo does show his EF is preserved with some wall motion abnormality. - Continue carvedilol 3.125 mg twice daily -Continue losartan 25 mg daily - Continue spironolactone 25 mg daily -PRN IV diuresis, strict ins and outs, weigh daily, sodium restricted diet -Per cardiology patient will need likely  cardiac catheterization though it is unclear.  B a candidate for CABG  #) Paroxysmal atrial fibrillation/flutter: Patient cardioverted chemically with amiodarone infusion. -Continue beta-blocker -Continue apixaban 5 mg twice daily -Telemetry  #) COPD with chronic tobacco abuse: -Continue ICS/LABA -Continue PRN bronchodilators -Continue nicotine patch  #) Stage IIIb non-small cell lung cancer: Patient last received chemotherapy on 07/19/2018 with pembrolizumab, pemetrexed, carboplatin -Oncology notified that patient is hospitalized  Fluids: Restricted Electrolytes: Monitor and supplement Nutrition: Heart healthy die  Prophylaxis: On apixaban  Disposition: Pending further diuresis and completion of amiodarone load  Full code  Consultants:   Cardiology  Procedures:   07/30/2018 echo: Pending read  Antimicrobials:   None   Subjective: This morning patient reports that he is feeling fairly poorly.  He reports some chest wall pain.  He denies any nausea, vomiting, diarrhea, cough, congestion, rhinorrhea.  He does continue to have significant lower extremity edema and orthopnea but does not improve much  Objective: Vitals:   07/31/18 0500 07/31/18 0600 07/31/18 0700 07/31/18 0800  BP: (!) 135/92 133/82 132/71 135/76  Pulse: (!) 58 67 (!) 59 60  Resp: 20 (!) 21 20 14   Temp:    97.9 F (36.6 C)  TempSrc:    Oral  SpO2: 98% 99% 100% 100%  Weight:      Height:        Intake/Output Summary (Last 24 hours) at 07/31/2018 0947 Last data filed at 07/31/2018 0930 Gross per 24 hour  Intake 574.64 ml  Output 800 ml  Net -225.36 ml   Filed Weights   07/28/18 1529 07/29/18 0505 07/30/18 0417  Weight: 90.9 kg 92.1 kg 90.3 kg    Examination:  General exam: Appears calm and comfortable  Respiratory system: No increased work of breathing, intermittent wheezing, no crackles, scattered rhonchi Cardiovascular system: Distant heart sounds, regular rate and rhythm, no  murmurs Gastrointestinal system: Soft, nondistended, no rebound or guarding, plus bowel sounds. Central nervous system: Grossly intact, moving all extremities Extremities: 3+ tight lower extremity edema Skin: Port site is clean dry and intact Psychiatry: Judgement and insight appear normal. Mood & affect appropriate.     Data Reviewed: I have personally reviewed following labs and imaging studies  CBC: Recent Labs  Lab 07/25/18 0748 07/28/18 1211 07/29/18 0547 07/30/18 0314 07/31/18 0551  WBC 7.0 13.9* 11.6* 11.5* 10.4  NEUTROABS 5.9  --   --   --   --   HGB 10.3* 11.1* 10.8* 10.4* 10.2*  HCT 32.5* 37.0* 35.2* 34.0* 33.2*  MCV 103.2* 108.8* 106.0* 108.3* 108.5*  PLT 210 217 207 172 270   Basic Metabolic Panel: Recent Labs  Lab 07/25/18 0748 07/28/18 1211 07/29/18 0547 07/29/18 2301 07/30/18 0314 07/31/18 0551  NA 141 141 141  --  141 140  K 4.0 4.1 3.9  --  3.4* 4.2  CL 104 103 104  --  103 101  CO2 26 28 29   --  29 30  GLUCOSE 146* 143* 128*  --  127* 108*  BUN 13 29* 28*  --  23 20  CREATININE 0.75 0.87 0.87  --  0.76 0.74  CALCIUM 9.2 9.3 8.9  --  8.6* 8.7*  MG  --   --   --  2.0  --  2.0   GFR: Estimated Creatinine Clearance: 96.7 mL/min (by C-G formula based on SCr of 0.74 mg/dL). Liver Function Tests: Recent Labs  Lab 07/25/18 0748 07/28/18 1211  AST 13* 32  ALT 10 44  ALKPHOS 90 82  BILITOT 0.4 1.0  PROT 7.0 7.1  ALBUMIN 3.4* 3.9   No results for input(s): LIPASE, AMYLASE in the last 168 hours. No results for input(s): AMMONIA in the last 168 hours. Coagulation Profile: No results for input(s): INR, PROTIME in the last 168 hours. Cardiac Enzymes: Recent Labs  Lab 07/30/18 0418 07/30/18 1018 07/30/18 1904  TROPONINI 0.07* 0.10* 0.09*   BNP (last 3 results) No results for input(s): PROBNP in the last 8760 hours. HbA1C: No results for input(s): HGBA1C in the last 72 hours. CBG: Recent Labs  Lab 07/30/18 0323  GLUCAP 129*   Lipid  Profile: No results for input(s): CHOL, HDL, LDLCALC, TRIG, CHOLHDL, LDLDIRECT in the last 72 hours. Thyroid Function Tests: Recent Labs    07/29/18 0547  TSH 0.568   Anemia Panel: No results for input(s): VITAMINB12, FOLATE, FERRITIN, TIBC, IRON, RETICCTPCT in the last 72 hours. Sepsis Labs: No results for input(s): PROCALCITON, LATICACIDVEN in the last 168 hours.  Recent Results (from the past 240 hour(s))  MRSA PCR Screening     Status: None   Collection Time: 07/30/18  8:50 AM  Result Value Ref Range Status   MRSA by PCR NEGATIVE NEGATIVE Final    Comment:        The GeneXpert MRSA Assay (FDA approved for NASAL specimens only), is one component of a comprehensive MRSA colonization surveillance program. It is not intended to diagnose MRSA infection nor to guide or monitor treatment for MRSA infections. Performed at [facility], [place] [address] [place], [place] [address]          Radiology Studies: Dg Chest 1 View  Result Date: 07/30/2018 CLINICAL DATA:  Shortness of breath EXAM:  CHEST  1 VIEW COMPARISON:  Two days ago FINDINGS: Chronic cardiomegaly. Porta catheter on the right with tip the level of the right brachiocephalic. There is no edema, consolidation, effusion, or pneumothorax. IMPRESSION: No evidence of active disease. Electronically Signed   By: Monte Fantasia M.D.   On: 07/30/2018 04:22        Scheduled Meds: . apixaban  5 mg Oral BID  . carvedilol  3.125 mg Oral BID WC  . levalbuterol  0.63 mg Nebulization Q6H  . loratadine  10 mg Oral Daily  . losartan  25 mg Oral Daily  . mometasone-formoterol  2 puff Inhalation BID  . nicotine  14 mg Transdermal Daily  . pantoprazole  40 mg Oral Daily  . spironolactone  25 mg Oral Daily   Continuous Infusions: . sodium chloride 20 mL/hr at 07/31/18 0759  . amiodarone 30 mg/hr (07/31/18 0755)     LOS: 2 days    Time spent: [place], MD [facility]  If  7PM-7AM, please contact night-coverage [email or web address] Password [facility] 07/31/2018, 9:47 AM

## 2018-08-01 DIAGNOSIS — J449 Chronic obstructive pulmonary disease, unspecified: Secondary | ICD-10-CM

## 2018-08-01 DIAGNOSIS — C3490 Malignant neoplasm of unspecified part of unspecified bronchus or lung: Secondary | ICD-10-CM

## 2018-08-01 LAB — CBC
HCT: 35.2 % — ABNORMAL LOW (ref 39.0–52.0)
Hemoglobin: 10.8 g/dL — ABNORMAL LOW (ref 13.0–17.0)
MCH: 32.4 pg (ref 26.0–34.0)
MCHC: 30.7 g/dL (ref 30.0–36.0)
MCV: 105.7 fL — ABNORMAL HIGH (ref 80.0–100.0)
Platelets: 146 10*3/uL — ABNORMAL LOW (ref 150–400)
RBC: 3.33 MIL/uL — ABNORMAL LOW (ref 4.22–5.81)
RDW: 16.4 % — ABNORMAL HIGH (ref 11.5–15.5)
WBC: 11.3 10*3/uL — ABNORMAL HIGH (ref 4.0–10.5)
nRBC: 0 % (ref 0.0–0.2)

## 2018-08-01 LAB — COMPREHENSIVE METABOLIC PANEL
ALT: 33 U/L (ref 0–44)
AST: 19 U/L (ref 15–41)
Albumin: 3.5 g/dL (ref 3.5–5.0)
Alkaline Phosphatase: 77 U/L (ref 38–126)
Anion gap: 7 (ref 5–15)
BUN: 18 mg/dL (ref 8–23)
CO2: 32 mmol/L (ref 22–32)
Calcium: 9 mg/dL (ref 8.9–10.3)
Chloride: 100 mmol/L (ref 98–111)
Creatinine, Ser: 0.76 mg/dL (ref 0.61–1.24)
GFR calc Af Amer: >60 mL/min (ref >60)
GFR calc non Af Amer: >60 mL/min (ref >60)
Glucose, Bld: 114 mg/dL — ABNORMAL HIGH (ref 70–99)
Potassium: 3.7 mmol/L (ref 3.5–5.1)
Sodium: 139 mmol/L (ref 135–145)
Total Bilirubin: 1.2 mg/dL (ref 0.3–1.2)
Total Protein: 6.4 g/dL — ABNORMAL LOW (ref 6.5–8.1)

## 2018-08-01 LAB — MAGNESIUM: Magnesium: 2.1 mg/dL (ref 1.7–2.4)

## 2018-08-01 MED ORDER — LOSARTAN POTASSIUM 50 MG PO TABS
50.0000 mg | ORAL_TABLET | Freq: Every day | ORAL | Status: DC
  Administered 2018-08-02 – 2018-08-05 (×4): 50 mg via ORAL
  Filled 2018-08-01 (×4): qty 1

## 2018-08-01 MED ORDER — FUROSEMIDE 10 MG/ML IJ SOLN
80.0000 mg | Freq: Two times a day (BID) | INTRAMUSCULAR | Status: DC
  Administered 2018-08-01 – 2018-08-02 (×3): 80 mg via INTRAVENOUS
  Filled 2018-08-01 (×3): qty 8

## 2018-08-01 MED ORDER — POTASSIUM CHLORIDE CRYS ER 20 MEQ PO TBCR
40.0000 meq | EXTENDED_RELEASE_TABLET | Freq: Two times a day (BID) | ORAL | Status: DC
  Administered 2018-08-01 – 2018-08-02 (×4): 40 meq via ORAL
  Filled 2018-08-01 (×4): qty 2

## 2018-08-01 NOTE — Progress Notes (Addendum)
Progress Note  Patient Name: Brian White Date of Encounter: 08/01/2018  Primary Cardiologist: Kirk Ruths, MD   Subjective   Breathing still not great, chest is sore from CPR, hard to take a deep breath or cough. Not sure he can lie flat tomorrow.  Inpatient Medications    Scheduled Meds: . [START ON 08/07/2018] amiodarone  400 mg Oral Daily  . amiodarone  400 mg Oral BID  . apixaban  5 mg Oral BID  . carvedilol  3.125 mg Oral BID WC  . furosemide  40 mg Intravenous BID  . levalbuterol  0.63 mg Nebulization Q6H  . loratadine  10 mg Oral Daily  . losartan  25 mg Oral Daily  . mometasone-formoterol  2 puff Inhalation BID  . nicotine  14 mg Transdermal Daily  . pantoprazole  40 mg Oral Daily  . spironolactone  25 mg Oral Daily   Continuous Infusions: . sodium chloride 20 mL/hr (08/01/18 0400)   PRN Meds: acetaminophen, guaiFENesin, magnesium hydroxide, mirtazapine, morphine injection, ondansetron (ZOFRAN) IV, oxyCODONE, sodium chloride flush   Vital Signs    Vitals:   08/01/18 0413 08/01/18 0500 08/01/18 0518 08/01/18 0600  BP:  140/64  136/72  Pulse: (!) 57 60 (!) 56 (!) 59  Resp: 18 20 (!) 24 (!) 7  Temp: 98.1 F (36.7 C)     TempSrc: Oral     SpO2: 96% 95% 96% 99%  Weight:      Height:        Intake/Output Summary (Last 24 hours) at 08/01/2018 0745 Last data filed at 08/01/2018 0600 Gross per 24 hour  Intake 1530.87 ml  Output 2100 ml  Net -569.13 ml   Last 3 Weights 07/30/2018 07/29/2018 07/28/2018  Weight (lbs) 199 lb 1.2 oz 203 lb 200 lb 6.4 oz  Weight (kg) 90.3 kg 92.08 kg 90.901 kg      Telemetry    SR, PACs and PVCs w/ rare pairs  Personally Reviewed  ECG    No new tracing- Personally Reviewed  Physical Exam   General: Well developed, elderly, male in no acute distress Head: Eyes PERRLA, No xanthomas.   Normocephalic and atraumatic Lungs: rhonchi noted, pt not able to cough well. Heart: HRRR S1 S2, without MRG (resp noise interferes w/  auscultation).  UE Pulses are 2+ & equal. Minimal JVD. Abdomen: Bowel sounds are present, abdomen soft and non-tender without masses or  hernias noted. Msk: Normal strength and tone for age. Extremities: No clubbing, cyanosis, 2+ LE edema.    Skin:  No rashes or lesions noted. Neuro: Alert and oriented X 3. Psych:  Good affect, responds appropriately  Labs    Chemistry Recent Labs  Lab 07/25/18 0748 07/28/18 1211  07/30/18 0314 07/31/18 0551 08/01/18 0518  NA 141 141   < > 141 140 139  K 4.0 4.1   < > 3.4* 4.2 3.7  CL 104 103   < > 103 101 100  CO2 26 28   < > 29 30 32  GLUCOSE 146* 143*   < > 127* 108* 114*  BUN 13 29*   < > 23 20 18   CREATININE 0.75 0.87   < > 0.76 0.74 0.76  CALCIUM 9.2 9.3   < > 8.6* 8.7* 9.0  PROT 7.0 7.1  --   --   --  6.4*  ALBUMIN 3.4* 3.9  --   --   --  3.5  AST 13* 32  --   --   --  19  ALT 10 44  --   --   --  33  ALKPHOS 90 82  --   --   --  77  BILITOT 0.4 1.0  --   --   --  1.2  GFRNONAA >60 >60   < > >60 >60 >60  GFRAA >60 >60   < > >60 >60 >60  ANIONGAP 11 10   < > 9 9 7    < > = values in this interval not displayed.     Hematology Recent Labs  Lab 07/30/18 0314 07/31/18 0551 08/01/18 0518  WBC 11.5* 10.4 11.3*  RBC 3.14* 3.06* 3.33*  HGB 10.4* 10.2* 10.8*  HCT 34.0* 33.2* 35.2*  MCV 108.3* 108.5* 105.7*  MCH 33.1 33.3 32.4  MCHC 30.6 30.7 30.7  RDW 16.5* 16.6* 16.4*  PLT 172 150 146*    Cardiac Enzymes Recent Labs  Lab 07/30/18 0418 07/30/18 1018 07/30/18 1904  TROPONINI 0.07* 0.10* 0.09*    Radiology    No results found.  Cardiac Studies   Echo July 30, 2018 - Left ventricle: Diffuse hypokinesis worse in the inferior wall.   The cavity size was moderately dilated. Wall thickness was   normal. The estimated ejection fraction was 25%. The study is not   technically sufficient to allow evaluation of LV diastolic   function. - Aortic valve: Sclerosis without stenosis. - Mitral valve: Calcified annulus.  Mildly thickened leaflets . - Left atrium: The atrium was moderately dilated. - Atrial septum: No defect or patent foramen ovale was identified.  Patient Profile     68 y.o. male with persistent atrial flutter,  severely depressed left ventricular systolic function of uncertain etiology, COPD, lung cancer, admitted on January 12 with acute on chronic heart failure exacerbation and developed in-hospital cardiac arrest/sustained ventricular tachycardia resolved with a single defibrillatory shock early morning January 14.  The defibrillator shock also resolved the atrial flutter.  Assessment & Plan    1. VT:   - IV>>PO Amio at 400 mg bid x 1 week (started 01/15)>>400 mg qd  - ?2nd CAD vs CHF +/- Hypokalemia - cath when stable  2. AFlutter:  - ICD fire for VT>>put A flutter into SR. - hold Eliquis x 2 doses precath, if we think he can go tomorrow, need to hold tonight - hopefully amio will help VT and flutter  3. CHF:   - wt 193 lbs when seen by Dr [name] 05/21/2018 and volume status good - no wt since 01/14 when he was 199>>need - I/O net - 1 L since admit, mainly due to IVF, this should improve now that amio is po - output 2.1 L yesterday, rec'd Lasix 40 mg IV x 1, now on Lasix 40 mg IV BID - he was on a regular diet, will change to low Na - daily wts, strict I/O  4. Lung Ca:  - Oncology to manage chemo, did not tolerate Cyramza, unclear what is next - They will need to consider interactions w/ Amio when choosing chemo rx  5. LVD: Unclear if ICM vs NICM - EF no improvement from October, 25% - Soft BP>>no Entresto - on low-dose Coreg, losartan and spiro - cath when stable from a respiratory standpoint - doubt CABG candidate, but may be able to tolerate PCI, watch blood counts  6. COPD:  - tobacco cessation is key - lung function could worsen w/ amio, will have to follow carefully - no other good antiarrhythmic options.  For questions or updates, please contact [facility] Please consult [email or web address] for contact info under        Signed, Rosaria Ferries, PA-C  08/01/2018, 7:45 AM    I have seen and examined the patient along with Rosaria Ferries, PA-C .  I have reviewed the chart, notes and new data.  I agree with PA/NP's note.  Key new complaints: Still has orthopnea.  Musculoskeletal chest pain from CPR.  No anginal pain. Key examination changes: Still markedly hypervolemic.  Lungs are clear but he has 2-3+ pedal edema and JVD 8-10 cm Key new findings / data: Potassium 3.7, normal renal parameters.  Maintaining sinus rhythm with frequent PVCs on monitor.  PLAN: Increase diuretics.  Watch potassium closely.  He is also on losartan, spironolactone and a relatively high dose of potassium supplementation. It is possible we might be able to pull off some fluid next 24 hours so he can lie flat for cath tomorrow but I think it is more realistic this will be performed on Monday. Blood pressure has actually been better.  We will try to increase losartan. No room for additional carvedilol due to sinus bradycardia and underlying reactive airway disease.  Sanda Klein, MD, [place] [phone] 08/01/2018, 11:05 AM

## 2018-08-01 NOTE — Plan of Care (Signed)
  Problem: Activity: Goal: Risk for activity intolerance will decrease Outcome: Progressing   Problem: Nutrition: Goal: Adequate nutrition will be maintained Outcome: Progressing   Problem: Pain Managment: Goal: General experience of comfort will improve Outcome: Progressing   

## 2018-08-01 NOTE — Progress Notes (Signed)
PROGRESS NOTE    Brian White  JXB:[id number] DOB: October 05, 1950 DOA: 07/28/2018 PCP: [name], MD   Brief Narrative:  68 year old with past medical history relevant for stage IIIb lung cancer, chronic systolic heart failure with EF of 25% (echo on 07/30/2018, wall motion abnormality on inferior wall), paroxysmal atrial fibrillation/flutter on apixaban, hypertension, COPD with chronic tobacco abuse who presented with atrial fibrillation with rapid ventricular response and subsequently developed ventricular tachycardia with arrest status post defibrillation and initiation of amiodarone.   Assessment & Plan:   Principal Problem:   Atrial fibrillation (HCC) Active Problems:   HTN (hypertension)   COPD (chronic obstructive pulmonary disease) (HCC)   Smoker   HLD (hyperlipidemia)   Cancer of lower lobe of left lung (HCC)   Anticoagulated   GERD (gastroesophageal reflux disease)   Acute on chronic systolic heart failure (HCC)   A-fib (HCC)   Ventricular tachycardia ([place])   #) VT arrest: This was in the setting of being on a diltiazem drip for atrial fibrillation with rapid response. Per cardiology it is felt that the primary exacerbating factor was a heart failure exacerbation. - Echo performed on 07/31/2016 shows wall motion abnormalities -Cardiology consulted, appreciate recommendations, plan for possible cardiac catheterization on 08/02/2018 if heart failure exacerbation improved -Diuresis per below -Continue amiodarone load, plan to transition to 400 mg by mouth daily -Keep potassium greater than 4 and magnesium greater than 2, telemetry  #) Acute on chronic systolic heart failure exacerbation: Patient has significant orthopnea and bilateral lower extremity edema.  Echo does show his EF is preserved with some wall motion abnormality. - Continue carvedilol 3.125 mg twice daily -Continue losartan 25 mg daily - Continue spironolactone 25 mg daily -IV furosemide 40 mg every 12 hours  strict ins and outs, weigh daily, sodium restricted diet  #) Paroxysmal atrial fibrillation/flutter: Patient cardioverted chemically with amiodarone infusion. -Continue beta-blocker -Continue apixaban 5 mg twice daily -Telemetry  #) COPD with chronic tobacco abuse: -Continue ICS/LABA -Continue PRN bronchodilators -Continue nicotine patch  #) Stage IIIb non-small cell lung cancer: Patient last received chemotherapy on 07/19/2018 with pembrolizumab, pemetrexed, carboplatin but apparently had a reaction to the chemotherapy -Oncology notified that patient is hospitalized  Fluids: Restricted Electrolytes: Monitor and supplement Nutrition: Heart healthy diet  Prophylaxis: On apixaban  Disposition: Pending further diuresis and possible cardiac catheterization  Full code  Consultants:   Cardiology  Procedures:  07/30/2018 echo: - Left ventricle: Diffuse hypokinesis worse in the inferior wall.   The cavity size was moderately dilated. Wall thickness was   normal. The estimated ejection fraction was 25%. The study is not   technically sufficient to allow evaluation of LV diastolic   function. - Aortic valve: Sclerosis without stenosis. - Mitral valve: Calcified annulus. Mildly thickened leaflets . - Left atrium: The atrium was moderately dilated.  - Atrial septum: No defect or patent foramen ovale was identified.  Antimicrobials:   None   Subjective: This morning patient reports that he is feeling about the same.  He denies any nausea, vomiting, diarrhea, chest pain.  He does report decreasing lower extremity edema but continues to have orthopnea.  Objective: Vitals:   08/01/18 0518 08/01/18 0600 08/01/18 0800 08/01/18 0830  BP:  136/72 128/87   Pulse: (!) 56 (!) 59 80   Resp: (!) 24 (!) 7 20   Temp:   98.4 F (36.9 C)   TempSrc:   Oral   SpO2: 96% 99% (!) 76%   Weight:    89.1  kg  Height:        Intake/Output Summary (Last 24 hours) at 08/01/2018 5176 Last data  filed at 08/01/2018 0600 Gross per 24 hour  Intake 1212.53 ml  Output 2000 ml  Net -787.47 ml   Filed Weights   07/29/18 0505 07/30/18 0417 08/01/18 0830  Weight: 92.1 kg 90.3 kg 89.1 kg    Examination:  General exam: Appears calm and comfortable  Respiratory system: No increased work of breathing, intermittent wheezing, no crackles, scattered rhonchi Cardiovascular system: Distant heart sounds, regular rate and rhythm, no murmurs Gastrointestinal system: Soft, nondistended, no rebound or guarding, plus bowel sounds. Central nervous system: Grossly intact, moving all extremities Extremities: 2+ lower extremity edema Skin: Port site is clean dry and intact Psychiatry: Judgement and insight appear normal. Mood & affect appropriate.     Data Reviewed: I have personally reviewed following labs and imaging studies  CBC: Recent Labs  Lab 07/28/18 1211 07/29/18 0547 07/30/18 0314 07/31/18 0551 08/01/18 0518  WBC 13.9* 11.6* 11.5* 10.4 11.3*  HGB 11.1* 10.8* 10.4* 10.2* 10.8*  HCT 37.0* 35.2* 34.0* 33.2* 35.2*  MCV 108.8* 106.0* 108.3* 108.5* 105.7*  PLT 217 207 172 150 160*   Basic Metabolic Panel: Recent Labs  Lab 07/28/18 1211 07/29/18 0547 07/29/18 2301 07/30/18 0314 07/31/18 0551 08/01/18 0518  NA 141 141  --  141 140 139  K 4.1 3.9  --  3.4* 4.2 3.7  CL 103 104  --  103 101 100  CO2 28 29  --  29 30 32  GLUCOSE 143* 128*  --  127* 108* 114*  BUN 29* 28*  --  23 20 18   CREATININE 0.87 0.87  --  0.76 0.74 0.76  CALCIUM 9.3 8.9  --  8.6* 8.7* 9.0  MG  --   --  2.0  --  2.0 2.1   GFR: Estimated Creatinine Clearance: 96.7 mL/min (by C-G formula based on SCr of 0.76 mg/dL). Liver Function Tests: Recent Labs  Lab 07/28/18 1211 08/01/18 0518  AST 32 19  ALT 44 33  ALKPHOS 82 77  BILITOT 1.0 1.2  PROT 7.1 6.4*  ALBUMIN 3.9 3.5   No results for input(s): LIPASE, AMYLASE in the last 168 hours. No results for input(s): AMMONIA in the last 168  hours. Coagulation Profile: No results for input(s): INR, PROTIME in the last 168 hours. Cardiac Enzymes: Recent Labs  Lab 07/30/18 0418 07/30/18 1018 07/30/18 1904  TROPONINI 0.07* 0.10* 0.09*   BNP (last 3 results) No results for input(s): PROBNP in the last 8760 hours. HbA1C: No results for input(s): HGBA1C in the last 72 hours. CBG: Recent Labs  Lab 07/30/18 0323  GLUCAP 129*   Lipid Profile: No results for input(s): CHOL, HDL, LDLCALC, TRIG, CHOLHDL, LDLDIRECT in the last 72 hours. Thyroid Function Tests: No results for input(s): TSH, T4TOTAL, FREET4, T3FREE, THYROIDAB in the last 72 hours. Anemia Panel: No results for input(s): VITAMINB12, FOLATE, FERRITIN, TIBC, IRON, RETICCTPCT in the last 72 hours. Sepsis Labs: No results for input(s): PROCALCITON, LATICACIDVEN in the last 168 hours.  Recent Results (from the past 240 hour(s))  MRSA PCR Screening     Status: None   Collection Time: 07/30/18  8:50 AM  Result Value Ref Range Status   MRSA by PCR NEGATIVE NEGATIVE Final    Comment:        The GeneXpert MRSA Assay (FDA approved for NASAL specimens only), is one component of a comprehensive MRSA colonization surveillance program.  It is not intended to diagnose MRSA infection nor to guide or monitor treatment for MRSA infections. Performed at [facility] [place] [address] [place], [place] [address]          Radiology Studies: No results found.      Scheduled Meds: . [START ON 08/07/2018] amiodarone  400 mg Oral Daily  . amiodarone  400 mg Oral BID  . apixaban  5 mg Oral BID  . carvedilol  3.125 mg Oral BID WC  . furosemide  40 mg Intravenous BID  . levalbuterol  0.63 mg Nebulization Q6H  . loratadine  10 mg Oral Daily  . losartan  25 mg Oral Daily  . mometasone-formoterol  2 puff Inhalation BID  . nicotine  14 mg Transdermal Daily  . pantoprazole  40 mg Oral Daily  . potassium chloride  40 mEq Oral BID  . spironolactone  25  mg Oral Daily   Continuous Infusions: . sodium chloride 20 mL/hr (08/01/18 0400)     LOS: 3 days    Time spent: [place], MD [facility]  If 7PM-7AM, please contact night-coverage [email or web address] Password [email or web address] 08/01/2018, 9:21 AM

## 2018-08-02 ENCOUNTER — Inpatient Hospital Stay (HOSPITAL_COMMUNITY): Payer: BLUE CROSS/BLUE SHIELD

## 2018-08-02 DIAGNOSIS — C3492 Malignant neoplasm of unspecified part of left bronchus or lung: Secondary | ICD-10-CM

## 2018-08-02 LAB — BASIC METABOLIC PANEL
Anion gap: 11 (ref 5–15)
BUN: 16 mg/dL (ref 8–23)
CO2: 29 mmol/L (ref 22–32)
Calcium: 8.7 mg/dL — ABNORMAL LOW (ref 8.9–10.3)
Chloride: 97 mmol/L — ABNORMAL LOW (ref 98–111)
Creatinine, Ser: 0.92 mg/dL (ref 0.61–1.24)
GFR calc Af Amer: >60 mL/min (ref >60)
GFR calc non Af Amer: >60 mL/min (ref >60)
Glucose, Bld: 158 mg/dL — ABNORMAL HIGH (ref 70–99)
Potassium: 3.5 mmol/L (ref 3.5–5.1)
Sodium: 137 mmol/L (ref 135–145)

## 2018-08-02 LAB — CBC
HCT: 34.8 % — ABNORMAL LOW (ref 39.0–52.0)
Hemoglobin: 10.6 g/dL — ABNORMAL LOW (ref 13.0–17.0)
MCH: 32.8 pg (ref 26.0–34.0)
MCHC: 30.5 g/dL (ref 30.0–36.0)
MCV: 107.7 fL — ABNORMAL HIGH (ref 80.0–100.0)
Platelets: 129 10*3/uL — ABNORMAL LOW (ref 150–400)
RBC: 3.23 MIL/uL — ABNORMAL LOW (ref 4.22–5.81)
RDW: 16.4 % — ABNORMAL HIGH (ref 11.5–15.5)
WBC: 10.4 10*3/uL (ref 4.0–10.5)
nRBC: 0 % (ref 0.0–0.2)

## 2018-08-02 LAB — MAGNESIUM: Magnesium: 2 mg/dL (ref 1.7–2.4)

## 2018-08-02 MED ORDER — POTASSIUM CHLORIDE CRYS ER 20 MEQ PO TBCR
40.0000 meq | EXTENDED_RELEASE_TABLET | Freq: Once | ORAL | Status: AC
  Administered 2018-08-02: 40 meq via ORAL
  Filled 2018-08-02: qty 2

## 2018-08-02 MED ORDER — POTASSIUM CHLORIDE CRYS ER 20 MEQ PO TBCR: 40.0000 meq | EXTENDED_RELEASE_TABLET | Freq: Once | ORAL | Status: AC

## 2018-08-02 MED ORDER — CHLORHEXIDINE GLUCONATE CLOTH 2 % EX PADS
6.0000 | MEDICATED_PAD | Freq: Every day | CUTANEOUS | Status: DC
  Administered 2018-08-02 – 2018-08-03 (×2): 6 via TOPICAL

## 2018-08-02 NOTE — Progress Notes (Signed)
  Pt Eliquis to be held for cath.  Last dose will be 01/18 pm. Eliquis will need to be restarted after cath.  Rosaria Ferries, PA-C 08/02/2018 3:13 PM Beeper [phone]

## 2018-08-02 NOTE — Progress Notes (Signed)
Patient's standing actual weight obtained=86.7kg and Rosaria Ferries, PA notified. Informed RN to hold AM Lasix tomorrow 1/18 if weight is less than 190lbs until someone rounds to make the decision to give it or not. This RN will pass on Cardiology request to obtain daily weights every morning.

## 2018-08-02 NOTE — Progress Notes (Addendum)
Progress Note  Patient Name: Brian White Date of Encounter: 08/02/2018  Primary Cardiologist: Kirk Ruths, MD   Subjective   Still hurts very much to breathe, still feels SOB, coughing is painful  Inpatient Medications    Scheduled Meds: . [START ON 08/07/2018] amiodarone  400 mg Oral Daily  . amiodarone  400 mg Oral BID  . apixaban  5 mg Oral BID  . carvedilol  3.125 mg Oral BID WC  . furosemide  80 mg Intravenous BID  . levalbuterol  0.63 mg Nebulization Q6H  . loratadine  10 mg Oral Daily  . losartan  50 mg Oral Daily  . mometasone-formoterol  2 puff Inhalation BID  . nicotine  14 mg Transdermal Daily  . pantoprazole  40 mg Oral Daily  . potassium chloride  40 mEq Oral BID  . spironolactone  25 mg Oral Daily   Continuous Infusions: . sodium chloride 10 mL/hr at 08/02/18 0600   PRN Meds: acetaminophen, guaiFENesin, magnesium hydroxide, mirtazapine, morphine injection, ondansetron (ZOFRAN) IV, oxyCODONE, sodium chloride flush   Vital Signs    Vitals:   08/02/18 0400 08/02/18 0500 08/02/18 0600 08/02/18 0805  BP: 123/70 106/61 (!) 105/57   Pulse: (!) 47 62 (!) 59   Resp: (!) 22 (!) 23 19   Temp: 98.1 F (36.7 C)   98 F (36.7 C)  TempSrc: Oral   Oral  SpO2: 93% 96% 91%   Weight:      Height:        Intake/Output Summary (Last 24 hours) at 08/02/2018 0853 Last data filed at 08/02/2018 0600 Gross per 24 hour  Intake 958.15 ml  Output 3650 ml  Net -2691.85 ml   Last 3 Weights 08/01/2018 07/30/2018 07/29/2018  Weight (lbs) 196 lb 6.9 oz 199 lb 1.2 oz 203 lb  Weight (kg) 89.1 kg 90.3 kg 92.08 kg      Telemetry    SR, freq PVCs and pairs - Personally Reviewed  ECG    No new tracing- Personally Reviewed  Physical Exam   General: Well developed, well nourished, male in no acute distress Head: Eyes PERRLA, No xanthomas.   Normocephalic and atraumatic Lungs: +wheeze and rhonchi, some rales. Heart: HRRR S1 S2, without MRG.  Pulses are 2+ & equal. mild  JVD. Abdomen: Bowel sounds are present, abdomen soft and non-tender without masses or  hernias noted. Msk: Normal strength and tone for age. Extremities: No clubbing, cyanosis, 1+ edema but sits w/ legs down all the time Skin:  No rashes or lesions noted. Neuro: Alert and oriented X 3. Psych:  Good affect, responds appropriately  Labs    Chemistry Recent Labs  Lab 07/28/18 1211  07/31/18 0551 08/01/18 0518 08/02/18 0555  NA 141   < > 140 139 137  K 4.1   < > 4.2 3.7 3.5  CL 103   < > 101 100 97*  CO2 28   < > 30 32 29  GLUCOSE 143*   < > 108* 114* 158*  BUN 29*   < > 20 18 16   CREATININE 0.87   < > 0.74 0.76 0.92  CALCIUM 9.3   < > 8.7* 9.0 8.7*  PROT 7.1  --   --  6.4*  --   ALBUMIN 3.9  --   --  3.5  --   AST 32  --   --  19  --   ALT 44  --   --  33  --  ALKPHOS 82  --   --  77  --   BILITOT 1.0  --   --  1.2  --   GFRNONAA >60   < > >60 >60 >60  GFRAA >60   < > >60 >60 >60  ANIONGAP 10   < > 9 7 11    < > = values in this interval not displayed.     Hematology Recent Labs  Lab 07/31/18 0551 08/01/18 0518 08/02/18 0555  WBC 10.4 11.3* 10.4  RBC 3.06* 3.33* 3.23*  HGB 10.2* 10.8* 10.6*  HCT 33.2* 35.2* 34.8*  MCV 108.5* 105.7* 107.7*  MCH 33.3 32.4 32.8  MCHC 30.7 30.7 30.5  RDW 16.6* 16.4* 16.4*  PLT 150 146* 129*    Cardiac Enzymes Recent Labs  Lab 07/30/18 0418 07/30/18 1018 07/30/18 1904  TROPONINI 0.07* 0.10* 0.09*    Radiology    Dg Chest 1 View  Result Date: 07/30/2018 CLINICAL DATA:  Shortness of breath EXAM: CHEST  1 VIEW COMPARISON:  Two days ago FINDINGS: Chronic cardiomegaly. Porta catheter on the right with tip the level of the right brachiocephalic. There is no edema, consolidation, effusion, or pneumothorax. IMPRESSION: No evidence of active disease. Electronically Signed   By: Monte Fantasia M.D.   On: 07/30/2018 04:22   Ct Chest W Contrast  Result Date: 07/18/2018 CLINICAL DATA:  Patient with history of non-small cell lung  cancer status post chemotherapy and radiation. EXAM: CT CHEST, ABDOMEN, AND PELVIS WITH CONTRAST TECHNIQUE: Multidetector CT imaging of the chest, abdomen and pelvis was performed following the standard protocol during bolus administration of intravenous contrast. CONTRAST:  172mL OMNIPAQUE IOHEXOL 300 MG/ML  SOLN COMPARISON:  CT CAP 04/22/2018 FINDINGS: CT CHEST FINDINGS Cardiovascular: Heart is enlarged. New small pericardial effusion. Coronary arterial vascular calcifications. Thoracic aortic vascular calcifications. Mediastinum/Nodes: Interval increase in size of soft tissue within the AP window measuring 3.5 x 3.9 cm (image 23; series 2), previously 2.9 x 2.4 cm. Increase in left infrahilar adenopathy measuring 1.5 cm (image 34; series 2), previously 0.7 cm. Increased left infrahilar and left hilar soft tissue fullness. Additionally, there is new vague soft tissue fullness within the right hilum (image 21; series 2). Lungs/Pleura: Marked narrowing of the left mainstem bronchus (image 63; series 4). Additionally there is narrowing of the lingular and left lower lobe bronchi. Interval increase in nodularity within the medial left lower lobe at the site of associated scarring with nodularity measuring up to 1.1 cm (image 90; series 4), previously 0.7 cm. Unchanged 6 mm low-attenuation nodule left upper lobe (image 33; series 4). New small left layering pleural effusion. No pneumothorax. Patchy ground-glass and consolidative opacities within the medial right upper lobe (image 54; series 4), new from prior. Musculoskeletal: Thoracic spine degenerative changes. No aggressive or acute appearing osseous lesions. CT ABDOMEN PELVIS FINDINGS Hepatobiliary: The liver is normal in size and contour. Fatty deposition adjacent to the falciform ligament. Gallbladder is decompressed. No intrahepatic or extrahepatic biliary ductal dilatation. Pancreas: Unremarkable Spleen: Unremarkable Adrenals/Urinary Tract: Normal adrenal  glands. Bilateral perinephric fat stranding. Urinary bladder wall thickening. No after fro CIS. Stomach/Bowel: No abnormal bowel wall thickening or evidence for bowel obstruction. No free fluid or free intraperitoneal air. No are of the stomach. Vascular/Lymphatic: Normal caliber abdominal aorta. Interval increase in size of aortocaval lymph node measuring 2.4 x 1.8 cm (image 70; series 2), previously 2.2 x 1.6 cm. 1.4 cm preaortic lymph node (image 75; series 2), unchanged from prior. Interval increase in size  of gastrohepatic node measuring 2.0 cm (image 55; series 2), previously 1.4 cm. Reproductive: Heterogeneous enlarged prostate. Other: Small bilateral fat containing inguinal hernias. Musculoskeletal: Lumbar spine degenerative changes. No aggressive or acute appearing osseous lesions. IMPRESSION: 1. Interval increase in size of nodal mass within the AP window as well as soft tissue extending into the left hilum. There is associated narrowing of the left lung bronchi. 2. Increased nodularity within the left lower lobe which may represent disease progression. New small left pleural effusion. 3. Interval increase in size of a few of the retroperitoneal lymph nodes and gastrohepatic node within the abdomen. 4. New ground-glass and consolidative opacities within the medial right upper lobe which may be infectious/inflammatory in etiology. Recommend attention on follow-up. Electronically Signed   By: Lovey Newcomer M.D.   On: 07/18/2018 13:05   Ct Abdomen Pelvis W Contrast  Result Date: 07/18/2018 CLINICAL DATA:  Patient with history of non-small cell lung cancer status post chemotherapy and radiation. EXAM: CT CHEST, ABDOMEN, AND PELVIS WITH CONTRAST TECHNIQUE: Multidetector CT imaging of the chest, abdomen and pelvis was performed following the standard protocol during bolus administration of intravenous contrast. CONTRAST:  134mL OMNIPAQUE IOHEXOL 300 MG/ML  SOLN COMPARISON:  CT CAP 04/22/2018 FINDINGS: CT CHEST  FINDINGS Cardiovascular: Heart is enlarged. New small pericardial effusion. Coronary arterial vascular calcifications. Thoracic aortic vascular calcifications. Mediastinum/Nodes: Interval increase in size of soft tissue within the AP window measuring 3.5 x 3.9 cm (image 23; series 2), previously 2.9 x 2.4 cm. Increase in left infrahilar adenopathy measuring 1.5 cm (image 34; series 2), previously 0.7 cm. Increased left infrahilar and left hilar soft tissue fullness. Additionally, there is new vague soft tissue fullness within the right hilum (image 21; series 2). Lungs/Pleura: Marked narrowing of the left mainstem bronchus (image 63; series 4). Additionally there is narrowing of the lingular and left lower lobe bronchi. Interval increase in nodularity within the medial left lower lobe at the site of associated scarring with nodularity measuring up to 1.1 cm (image 90; series 4), previously 0.7 cm. Unchanged 6 mm low-attenuation nodule left upper lobe (image 33; series 4). New small left layering pleural effusion. No pneumothorax. Patchy ground-glass and consolidative opacities within the medial right upper lobe (image 54; series 4), new from prior. Musculoskeletal: Thoracic spine degenerative changes. No aggressive or acute appearing osseous lesions. CT ABDOMEN PELVIS FINDINGS Hepatobiliary: The liver is normal in size and contour. Fatty deposition adjacent to the falciform ligament. Gallbladder is decompressed. No intrahepatic or extrahepatic biliary ductal dilatation. Pancreas: Unremarkable Spleen: Unremarkable Adrenals/Urinary Tract: Normal adrenal glands. Bilateral perinephric fat stranding. Urinary bladder wall thickening. No after fro CIS. Stomach/Bowel: No abnormal bowel wall thickening or evidence for bowel obstruction. No free fluid or free intraperitoneal air. No are of the stomach. Vascular/Lymphatic: Normal caliber abdominal aorta. Interval increase in size of aortocaval lymph node measuring 2.4 x 1.8 cm  (image 70; series 2), previously 2.2 x 1.6 cm. 1.4 cm preaortic lymph node (image 75; series 2), unchanged from prior. Interval increase in size of gastrohepatic node measuring 2.0 cm (image 55; series 2), previously 1.4 cm. Reproductive: Heterogeneous enlarged prostate. Other: Small bilateral fat containing inguinal hernias. Musculoskeletal: Lumbar spine degenerative changes. No aggressive or acute appearing osseous lesions. IMPRESSION: 1. Interval increase in size of nodal mass within the AP window as well as soft tissue extending into the left hilum. There is associated narrowing of the left lung bronchi. 2. Increased nodularity within the left lower lobe which may represent  disease progression. New small left pleural effusion. 3. Interval increase in size of a few of the retroperitoneal lymph nodes and gastrohepatic node within the abdomen. 4. New ground-glass and consolidative opacities within the medial right upper lobe which may be infectious/inflammatory in etiology. Recommend attention on follow-up. Electronically Signed   By: Lovey Newcomer M.D.   On: 07/18/2018 13:05   Dg Chest Port 1 View  Result Date: 07/28/2018 CLINICAL DATA:  Shortness of breath. EXAM: PORTABLE CHEST 1 VIEW COMPARISON:  Radiographs of May 01, 2018. FINDINGS: Stable cardiomegaly. Right internal jugular Port-A-Cath is unchanged in position. No pneumothorax or pleural effusion is noted. Bony thorax is unremarkable. IMPRESSION: No acute cardiopulmonary abnormality seen. Electronically Signed   By: Marijo Conception, M.D.   On: 07/28/2018 12:22     Cardiac Studies   Echo July 30, 2018 - Left ventricle: Diffuse hypokinesis worse in the inferior wall.   The cavity size was moderately dilated. Wall thickness was   normal. The estimated ejection fraction was 25%. The study is not   technically sufficient to allow evaluation of LV diastolic   function. - Aortic valve: Sclerosis without stenosis. - Mitral valve: Calcified  annulus. Mildly thickened leaflets . - Left atrium: The atrium was moderately dilated. - Atrial septum: No defect or patent foramen ovale was identified.  Patient Profile     68 y.o. male with persistent atrial flutter,  severely depressed left ventricular systolic function of uncertain etiology, COPD, lung cancer, admitted on January 12 with acute on chronic heart failure exacerbation and developed in-hospital cardiac arrest/sustained ventricular tachycardia resolved with a single defibrillatory shock early morning January 14.  The defibrillator shock also resolved the atrial flutter.  Assessment & Plan    1. VT:   - IV amio d/c'd 01/15>>400 mg bid x 1 week>>400 mg qd - s/p defib x 1>>SR - unclear if 2nd CAD, CHF or low K+ - cath Monday if pt ok  2. AFlutter:  - Defib for VT treated Aflutter>>SR - got Eliquis last pm, continue till Sunday - amio should help  3. CHF:   - I/O net - 3.7 L since admit, dry wt probably close to 193 lbs, still above that - continue IV Lasix - NEED daily wts>>spoke to nurse about making sure they get done.  - use TED hose since pt sits w/ legs down most of the time - family aware to get compression socks  4. Lung Ca:  - chemo per Oncology, but did not tolerate Cyramza - they will need to consider interactions w/ amio when selecting drugs  5. LVD:  - ?NICM vs ICM - EF 25% this admit, unchanged - on low-dose Coreg, losartan and spiro - with respiratory issues, should Coreg change to Toprol XL 25 mg? - BP low at times and needs BB>>no Entresto - cath when stable, hopefully Monday, think he could tolerate PCI  6. COPD:  - with no improvement in pain, will ck CXR - get PFTs once pt resp stable, as outpt - per IM   For questions or updates, please contact [place] Please consult [email or web address] for contact info under        Signed, Rosaria Ferries, PA-C  08/02/2018, 8:53 AM    I have seen and examined the patient along with Rosaria Ferries, PA-C .  I have reviewed the chart, notes and new data.  I agree with PA's note.  Key new complaints: improved diuresis, still dyspneic, but was able to lie  in bed for a couple of hours last night. Still feels breathing is better sitting up. Key examination changes: 2+ pedal edema, bilateral rhonchi and wheezes. Key new findings / data: stable renal function. K low normal range. No recurrence of atrial flutter. Frequent PVCs, no VT on telemetry   PLAN: Continue diuresis over the weekend.  Plan coronary angiography Monday to evaluate reason for cardiomyopathy and VT arrest. This procedure has been fully reviewed with the patient and written informed consent has been obtained. Need to hold Eliquis and Entresto on Sunday in anticipation of cardiac cath. Not a CABG candidate due to lung neoplasm and COPD, but may benefit from PCI.   Sanda Klein, MD, [place] [phone] 08/02/2018, 10:24 AM

## 2018-08-02 NOTE — Progress Notes (Signed)
PROGRESS NOTE    Brian White  FBP:[id number] DOB: 1951/04/19 DOA: 07/28/2018 PCP: [name], MD   Brief Narrative:  68 year old with past medical history relevant for stage IIIb lung cancer, chronic systolic heart failure with EF of 25% (echo on 07/30/2018, wall motion abnormality on inferior wall), paroxysmal atrial fibrillation/flutter on apixaban, hypertension, COPD with chronic tobacco abuse who presented with atrial fibrillation with rapid ventricular response and subsequently developed ventricular tachycardia with arrest status post defibrillation and initiation of amiodarone.   Assessment & Plan:   Principal Problem:   Atrial fibrillation (HCC) Active Problems:   HTN (hypertension)   COPD (chronic obstructive pulmonary disease) (HCC)   Smoker   HLD (hyperlipidemia)   Cancer of lower lobe of left lung (HCC)   Anticoagulated   GERD (gastroesophageal reflux disease)   Acute on chronic systolic heart failure (HCC)   A-fib (HCC)   Ventricular tachycardia (HCC)   Primary malignant neoplasm of lung metastatic to other site [facility])   #) VT arrest: This was in the setting of being on a diltiazem drip for atrial fibrillation with rapid response. Per cardiology it is felt that the primary exacerbating factor was a heart failure exacerbation. - Echo performed on 07/31/2016 shows wall motion abnormalities -Cardiology consulted, appreciate recommendations, plan for possible cardiac catheterization on 08/05/2018 tentatively -Diuresis per below -Continue amiodarone load, plan to transition to 400 mg by mouth daily -Keep potassium greater than 4 and magnesium greater than 2, telemetry  #) Acute on chronic systolic heart failure exacerbation: Patient has significant orthopnea and bilateral lower extremity edema.  Echo does show his EF is preserved with some wall motion abnormality. - Continue carvedilol 3.125 mg twice daily -Continue losartan 25 mg daily - Continue spironolactone 25 mg  daily -IV furosemide 80 mg every 12 hours strict ins and outs, weigh daily, sodium restricted diet  #) Paroxysmal atrial fibrillation/flutter: Patient cardioverted chemically with amiodarone infusion. -Continue beta-blocker -Continue apixaban 5 mg twice daily -Telemetry  #) COPD with chronic tobacco abuse: -Continue ICS/LABA -Continue PRN bronchodilators -Continue nicotine patch  #) Stage IIIb non-small cell lung cancer: Patient last received chemotherapy on 07/19/2018 with pembrolizumab, pemetrexed, carboplatin but apparently had a reaction to the chemotherapy -Oncology notified that patient is hospitalized  Fluids: Restricted Electrolytes: Monitor and supplement Nutrition: Heart healthy diet  Prophylaxis: On apixaban  Disposition: Pending further diuresis and possible cardiac catheterization  Full code  Consultants:   Cardiology  Procedures:  07/30/2018 echo: - Left ventricle: Diffuse hypokinesis worse in the inferior wall.   The cavity size was moderately dilated. Wall thickness was   normal. The estimated ejection fraction was 25%. The study is not   technically sufficient to allow evaluation of LV diastolic   function. - Aortic valve: Sclerosis without stenosis. - Mitral valve: Calcified annulus. Mildly thickened leaflets . - Left atrium: The atrium was moderately dilated.  - Atrial septum: No defect or patent foramen ovale was identified.  Antimicrobials:   None   Subjective: This morning patient reports that he is feeling about the same.  His orthopnea is improved and his lower extremity edema is decreased.  He continues to report a painful cough with chest wall pain.  Denies any nausea, vomiting, diarrhea.  Objective: Vitals:   08/02/18 0400 08/02/18 0500 08/02/18 0600 08/02/18 0805  BP: 123/70 106/61 (!) 105/57   Pulse: (!) 47 62 (!) 59   Resp: (!) 22 (!) 23 19   Temp: 98.1 F (36.7 C)   98 F (36.7  C)  TempSrc: Oral   Oral  SpO2: 93% 96% 91%     Weight:      Height:        Intake/Output Summary (Last 24 hours) at 08/02/2018 0958 Last data filed at 08/02/2018 0600 Gross per 24 hour  Intake 478.15 ml  Output 3650 ml  Net -3171.85 ml   Filed Weights   07/29/18 0505 07/30/18 0417 08/01/18 0830  Weight: 92.1 kg 90.3 kg 89.1 kg    Examination:  General exam: Appears calm and comfortable  Respiratory system: No increased work of breathing, intermittent wheezing, no crackles, scattered rhonchi Cardiovascular system: Distant heart sounds, regular rate and rhythm, no murmurs Gastrointestinal system: Soft, nondistended, no rebound or guarding, plus bowel sounds. Central nervous system: Grossly intact, moving all extremities Extremities: 2+ lower extremity edema Skin: Port site is clean dry and intact Psychiatry: Judgement and insight appear normal. Mood & affect appropriate.     Data Reviewed: I have personally reviewed following labs and imaging studies  CBC: Recent Labs  Lab 07/29/18 0547 07/30/18 0314 07/31/18 0551 08/01/18 0518 08/02/18 0555  WBC 11.6* 11.5* 10.4 11.3* 10.4  HGB 10.8* 10.4* 10.2* 10.8* 10.6*  HCT 35.2* 34.0* 33.2* 35.2* 34.8*  MCV 106.0* 108.3* 108.5* 105.7* 107.7*  PLT 207 172 150 146* 063*   Basic Metabolic Panel: Recent Labs  Lab 07/29/18 0547 07/29/18 2301 07/30/18 0314 07/31/18 0551 08/01/18 0518 08/02/18 0555  NA 141  --  141 140 139 137  K 3.9  --  3.4* 4.2 3.7 3.5  CL 104  --  103 101 100 97*  CO2 29  --  29 30 32 29  GLUCOSE 128*  --  127* 108* 114* 158*  BUN 28*  --  23 20 18 16   CREATININE 0.87  --  0.76 0.74 0.76 0.92  CALCIUM 8.9  --  8.6* 8.7* 9.0 8.7*  MG  --  2.0  --  2.0 2.1 2.0   GFR: Estimated Creatinine Clearance: 84.1 mL/min (by C-G formula based on SCr of 0.92 mg/dL). Liver Function Tests: Recent Labs  Lab 07/28/18 1211 08/01/18 0518  AST 32 19  ALT 44 33  ALKPHOS 82 77  BILITOT 1.0 1.2  PROT 7.1 6.4*  ALBUMIN 3.9 3.5   No results for input(s):  LIPASE, AMYLASE in the last 168 hours. No results for input(s): AMMONIA in the last 168 hours. Coagulation Profile: No results for input(s): INR, PROTIME in the last 168 hours. Cardiac Enzymes: Recent Labs  Lab 07/30/18 0418 07/30/18 1018 07/30/18 1904  TROPONINI 0.07* 0.10* 0.09*   BNP (last 3 results) No results for input(s): PROBNP in the last 8760 hours. HbA1C: No results for input(s): HGBA1C in the last 72 hours. CBG: Recent Labs  Lab 07/30/18 0323  GLUCAP 129*   Lipid Profile: No results for input(s): CHOL, HDL, LDLCALC, TRIG, CHOLHDL, LDLDIRECT in the last 72 hours. Thyroid Function Tests: No results for input(s): TSH, T4TOTAL, FREET4, T3FREE, THYROIDAB in the last 72 hours. Anemia Panel: No results for input(s): VITAMINB12, FOLATE, FERRITIN, TIBC, IRON, RETICCTPCT in the last 72 hours. Sepsis Labs: No results for input(s): PROCALCITON, LATICACIDVEN in the last 168 hours.  Recent Results (from the past 240 hour(s))  MRSA PCR Screening     Status: None   Collection Time: 07/30/18  8:50 AM  Result Value Ref Range Status   MRSA by PCR NEGATIVE NEGATIVE Final    Comment:        The GeneXpert MRSA  Assay (FDA approved for NASAL specimens only), is one component of a comprehensive MRSA colonization surveillance program. It is not intended to diagnose MRSA infection nor to guide or monitor treatment for MRSA infections. Performed at [facility], [place] [address] [place], [place] [address]          Radiology Studies: No results found.      Scheduled Meds: . [START ON 08/07/2018] amiodarone  400 mg Oral Daily  . amiodarone  400 mg Oral BID  . apixaban  5 mg Oral BID  . carvedilol  3.125 mg Oral BID WC  . Chlorhexidine Gluconate Cloth  6 each Topical Daily  . furosemide  80 mg Intravenous BID  . levalbuterol  0.63 mg Nebulization Q6H  . loratadine  10 mg Oral Daily  . losartan  50 mg Oral Daily  . mometasone-formoterol  2 puff  Inhalation BID  . nicotine  14 mg Transdermal Daily  . pantoprazole  40 mg Oral Daily  . potassium chloride  40 mEq Oral BID  . potassium chloride  40 mEq Oral Once  . spironolactone  25 mg Oral Daily   Continuous Infusions: . sodium chloride 10 mL/hr at 08/02/18 0600     LOS: 4 days    Time spent: [place], MD [facility]  If 7PM-7AM, please contact night-coverage [email or web address] Password [facility] 08/02/2018, 9:58 AM

## 2018-08-03 LAB — CBC
HCT: 35.9 % — ABNORMAL LOW (ref 39.0–52.0)
Hemoglobin: 11.2 g/dL — ABNORMAL LOW (ref 13.0–17.0)
MCH: 32.8 pg (ref 26.0–34.0)
MCHC: 31.2 g/dL (ref 30.0–36.0)
MCV: 105.3 fL — ABNORMAL HIGH (ref 80.0–100.0)
Platelets: 124 K/uL — ABNORMAL LOW (ref 150–400)
RBC: 3.41 MIL/uL — ABNORMAL LOW (ref 4.22–5.81)
RDW: 16.6 % — ABNORMAL HIGH (ref 11.5–15.5)
WBC: 9.4 K/uL (ref 4.0–10.5)
nRBC: 0 % (ref 0.0–0.2)

## 2018-08-03 LAB — BASIC METABOLIC PANEL WITH GFR
Anion gap: 10 (ref 5–15)
BUN: 17 mg/dL (ref 8–23)
CO2: 30 mmol/L (ref 22–32)
Calcium: 9.1 mg/dL (ref 8.9–10.3)
Chloride: 96 mmol/L — ABNORMAL LOW (ref 98–111)
Creatinine, Ser: 0.91 mg/dL (ref 0.61–1.24)
GFR calc Af Amer: >60 mL/min (ref >60)
GFR calc non Af Amer: >60 mL/min (ref >60)
Glucose, Bld: 94 mg/dL (ref 70–99)
Potassium: 4.7 mmol/L (ref 3.5–5.1)
Sodium: 136 mmol/L (ref 135–145)

## 2018-08-03 LAB — MAGNESIUM: Magnesium: 2 mg/dL (ref 1.7–2.4)

## 2018-08-03 MED ORDER — FUROSEMIDE 40 MG PO TABS
40.0000 mg | ORAL_TABLET | Freq: Every day | ORAL | Status: DC
  Administered 2018-08-03 – 2018-08-05 (×3): 40 mg via ORAL
  Filled 2018-08-03 (×3): qty 1

## 2018-08-03 MED ORDER — LEVALBUTEROL HCL 0.63 MG/3ML IN NEBU
0.6300 mg | INHALATION_SOLUTION | Freq: Three times a day (TID) | RESPIRATORY_TRACT | Status: DC
  Administered 2018-08-04 – 2018-08-05 (×4): 0.63 mg via RESPIRATORY_TRACT
  Filled 2018-08-03 (×4): qty 3

## 2018-08-03 MED ORDER — IPRATROPIUM-ALBUTEROL 0.5-2.5 (3) MG/3ML IN SOLN
3.0000 mL | RESPIRATORY_TRACT | Status: DC | PRN
  Administered 2018-08-04: 3 mL via RESPIRATORY_TRACT
  Filled 2018-08-03: qty 3

## 2018-08-03 MED ORDER — POTASSIUM CHLORIDE CRYS ER 20 MEQ PO TBCR
20.0000 meq | EXTENDED_RELEASE_TABLET | Freq: Every day | ORAL | Status: DC
  Administered 2018-08-03 – 2018-08-08 (×6): 20 meq via ORAL
  Filled 2018-08-03 (×6): qty 1

## 2018-08-03 NOTE — Progress Notes (Signed)
Progress Note  Patient Name: Brian White Date of Encounter: 08/03/2018  Primary Cardiologist: Kirk Ruths, MD   Subjective   68 year old gentleman with a history of severely depressed left ventricular systolic function, persistent atrial flutter, COPD, lung cancer and he was admitted with respiratory failure and ultimately had a cardiac arrest with ventricular tachycardia. He is scheduled for heart catheterization on Monday. Eliquis will be held after tonights dose    Inpatient Medications    Scheduled Meds: . [START ON 08/07/2018] amiodarone  400 mg Oral Daily  . amiodarone  400 mg Oral BID  . apixaban  5 mg Oral BID  . carvedilol  3.125 mg Oral BID WC  . Chlorhexidine Gluconate Cloth  6 each Topical Daily  . furosemide  80 mg Intravenous BID  . levalbuterol  0.63 mg Nebulization Q6H  . loratadine  10 mg Oral Daily  . losartan  50 mg Oral Daily  . mometasone-formoterol  2 puff Inhalation BID  . nicotine  14 mg Transdermal Daily  . pantoprazole  40 mg Oral Daily  . potassium chloride  40 mEq Oral BID  . spironolactone  25 mg Oral Daily   Continuous Infusions: . sodium chloride Stopped (08/02/18 1424)   PRN Meds: acetaminophen, guaiFENesin, ipratropium-albuterol, magnesium hydroxide, mirtazapine, morphine injection, ondansetron (ZOFRAN) IV, oxyCODONE, sodium chloride flush   Vital Signs    Vitals:   08/03/18 0137 08/03/18 0441 08/03/18 0700 08/03/18 0800  BP:   116/67   Pulse:   (!) 57   Resp:   20   Temp: 97.9 F (36.6 C) 98.1 F (36.7 C)  97.7 F (36.5 C)  TempSrc:  Oral  Axillary  SpO2:   95%   Weight:  85.9 kg    Height:        Intake/Output Summary (Last 24 hours) at 08/03/2018 0938 Last data filed at 08/03/2018 0700 Gross per 24 hour  Intake 423.97 ml  Output 2850 ml  Net -2426.03 ml   Last 3 Weights 08/03/2018 08/02/2018 08/01/2018  Weight (lbs) 189 lb 4.8 oz 191 lb 2.2 oz 196 lb 6.9 oz  Weight (kg) 85.866 kg 86.7 kg 89.1 kg      Telemetry     NSR at 62  - Personally Reviewed  ECG       Physical Exam    GEN:  Middle-aged gentleman.  He is in mild to moderate distress from chest wall soreness..   Neck: No JVD Cardiac: RRR, no murmurs, rubs, or gallops.  Respiratory:  Wheezing and rhonchi bilaterally GI: Soft, nontender, non-distended  MS: No edema; No deformity. Neuro:  Nonfocal  Psych: Normal affect   Labs    Chemistry Recent Labs  Lab 07/28/18 1211  08/01/18 0518 08/02/18 0555 08/03/18 0440  NA 141   < > 139 137 136  K 4.1   < > 3.7 3.5 4.7  CL 103   < > 100 97* 96*  CO2 28   < > 32 29 30  GLUCOSE 143*   < > 114* 158* 94  BUN 29*   < > 18 16 17   CREATININE 0.87   < > 0.76 0.92 0.91  CALCIUM 9.3   < > 9.0 8.7* 9.1  PROT 7.1  --  6.4*  --   --   ALBUMIN 3.9  --  3.5  --   --   AST 32  --  19  --   --   ALT 44  --  33  --   --  ALKPHOS 82  --  77  --   --   BILITOT 1.0  --  1.2  --   --   GFRNONAA >60   < > >60 >60 >60  GFRAA >60   < > >60 >60 >60  ANIONGAP 10   < > 7 11 10    < > = values in this interval not displayed.     Hematology Recent Labs  Lab 08/01/18 0518 08/02/18 0555 08/03/18 0440  WBC 11.3* 10.4 9.4  RBC 3.33* 3.23* 3.41*  HGB 10.8* 10.6* 11.2*  HCT 35.2* 34.8* 35.9*  MCV 105.7* 107.7* 105.3*  MCH 32.4 32.8 32.8  MCHC 30.7 30.5 31.2  RDW 16.4* 16.4* 16.6*  PLT 146* 129* 124*    Cardiac Enzymes Recent Labs  Lab 07/30/18 0418 07/30/18 1018 07/30/18 1904  TROPONINI 0.07* 0.10* 0.09*   No results for input(s): TROPIPOC in the last 168 hours.   BNPNo results for input(s): BNP, PROBNP in the last 168 hours.   DDimer No results for input(s): DDIMER in the last 168 hours.   Radiology    Dg Chest 2 View  Result Date: 08/02/2018 CLINICAL DATA:  Shortness of breath following CPR several days ago EXAM: CHEST - 2 VIEW COMPARISON:  07/30/2018 FINDINGS: Cardiac shadow is stable at the upper limits of normal in size. Right chest wall port is noted. The lungs are well aerated  without focal infiltrate. No acute rib fractures are seen. No pneumothorax is noted. Pericardial calcifications are noted posteriorly. IMPRESSION: No acute abnormality noted. Electronically Signed   By: Inez Catalina M.D.   On: 08/02/2018 15:01    Cardiac Studies  *  Patient Profile     68 y.o. male admitted with respiratory arrest and ventricular fibrillation.  He was defibrillated successfully.  Assessment & Plan    1.  Cardiac arrest: The patient had an episode of ventricular tachycardia during his cardiac arrest.  He was successfully defibrillated. He is scheduled for heart catheterization on Monday. We will hold the Eliquis after tonight's dose.  2.  Chronic combined systolic and diastolic congestive heart failure: He is on Lasix 80 mg IV twice daily. Home Dose of Lasix is 20 mg a day.  We will change his Lasix to 40 mg p.o. daily. Decrease Kdur to 20 meq daily  He can be transferred to tele       For questions or updates, please contact [place] Please consult [email or web address] for contact info under        Signed, Mertie Moores, MD  08/03/2018, 9:39 AM

## 2018-08-03 NOTE — Progress Notes (Signed)
PROGRESS NOTE    Brian White  PFX:[id number] DOB: 1950-12-12 DOA: 07/28/2018 PCP: [name], MD   Brief Narrative:  68 year old with past medical history relevant for stage IIIb lung cancer, chronic systolic heart failure with EF of 25% (echo on 07/30/2018, wall motion abnormality on inferior wall), paroxysmal atrial fibrillation/flutter on apixaban, hypertension, COPD with chronic tobacco abuse who presented with atrial fibrillation with rapid ventricular response and subsequently developed ventricular tachycardia with arrest status post defibrillation and initiation of amiodarone.   Assessment & Plan:   Principal Problem:   Atrial fibrillation with RVR (HCC) Active Problems:   HTN (hypertension)   COPD (chronic obstructive pulmonary disease) (HCC)   Smoker   Cancer of lower lobe of left lung (HCC)   Anticoagulated   PAF (paroxysmal atrial fibrillation) (HCC)   Acute on chronic systolic heart failure (HCC)   Ventricular tachycardia ([place])   Primary malignant neoplasm of lung metastatic to other site [facility])   #) VT arrest: This was in the setting of being on a diltiazem drip for atrial fibrillation with rapid response. Per cardiology it is felt that the primary exacerbating factor was a heart failure exacerbation. - Echo performed on 07/31/2016 shows wall motion abnormalities -Cardiology consulted, appreciate recommendations, plan for possible cardiac catheterization on 08/05/2018 tentatively -Diuresis per below -transition amiodarone to 400 mg by mouth daily -Keep potassium greater than 4 and magnesium greater than 2, telemetry  #) Acute on chronic systolic heart failure exacerbation: Patient has significant orthopnea and bilateral lower extremity edema.  Echo does show his EF is preserved with some wall motion abnormality. - Continue carvedilol 3.125 mg twice daily -increased  losartan to 50 mg daily - Continue spironolactone 25 mg daily -IV furosemide 80 mg every 12 hours  strict ins and outs, weigh daily, sodium restricted diet -- compression socks  #) Paroxysmal atrial fibrillation/flutter with RVR: Patient cardioverted chemically with amiodarone infusion. -Continue beta-blocker -Continue apixaban 5 mg twice daily; last dose Eliquis today's evening, hold afterwards for cath on Monday -Telemetry  #) COPD with chronic tobacco abuse: -Continue ICS/LABA -add  PRN bronchodilators -Continue nicotine patch --incentive spirometry  #) Stage IIIb non-small cell lung cancer: Patient last received chemotherapy on 07/19/2018 with pembrolizumab, pemetrexed, carboplatin but apparently had a reaction to the chemotherapy -Oncology notified that patient is hospitalized  Fluids: Restricted Electrolytes: Monitor and supplement Nutrition: Heart healthy diet  Prophylaxis: On apixaban  Disposition: Pending further diuresis and possible cardiac catheterization  Full code  Consultants:   Cardiology  Procedures:  07/30/2018 echo: - Left ventricle: Diffuse hypokinesis worse in the inferior wall.   The cavity size was moderately dilated. Wall thickness was   normal. The estimated ejection fraction was 25%. The study is not   technically sufficient to allow evaluation of LV diastolic   function. - Aortic valve: Sclerosis without stenosis. - Mitral valve: Calcified annulus. Mildly thickened leaflets . - Left atrium: The atrium was moderately dilated.  - Atrial septum: No defect or patent foramen ovale was identified.  Antimicrobials:   None   Subjective: This morning patient reports that he is feeling about the same.  His orthopnea is improved and his lower extremity edema is decreased.  He continues to report a painful cough with chest wall pain.  Denies any nausea, vomiting, diarrhea.  His weight is 189.3 lbs down from 191.14lbs. negative 6.1 L fluid balance.   Objective: Vitals:   08/03/18 0000 08/03/18 0137 08/03/18 0441 08/03/18 0700  BP:    116/67  Pulse:  (!) 59   (!) 57  Resp: 16   20  Temp:  97.9 F (36.6 C) 98.1 F (36.7 C)   TempSrc:   Oral   SpO2: 92%   95%  Weight:   85.9 kg   Height:        Intake/Output Summary (Last 24 hours) at 08/03/2018 0758 Last data filed at 08/03/2018 0700 Gross per 24 hour  Intake 443.97 ml  Output 2850 ml  Net -2406.03 ml   Filed Weights   08/01/18 0830 08/02/18 1500 08/03/18 0441  Weight: 89.1 kg 86.7 kg 85.9 kg    Examination:  General exam: Appears calm and comfortable  Respiratory system: No increased work of breathing, no wheezing, no crackles, + scattered rhonchi Cardiovascular system: Distant heart sounds, regular rate and rhythm, no murmurs Gastrointestinal system: Soft, nondistended, no rebound or guarding, plus bowel sounds. Central nervous system: Grossly intact, moving all extremities Extremities: 2+ lower extremity edema; wearing compression socks Skin: Port site is clean dry and intact Psychiatry: Judgement and insight appear normal. Mood & affect appropriate.     Data Reviewed: I have personally reviewed following labs and imaging studies  CBC: Recent Labs  Lab 07/30/18 0314 07/31/18 0551 08/01/18 0518 08/02/18 0555 08/03/18 0440  WBC 11.5* 10.4 11.3* 10.4 9.4  HGB 10.4* 10.2* 10.8* 10.6* 11.2*  HCT 34.0* 33.2* 35.2* 34.8* 35.9*  MCV 108.3* 108.5* 105.7* 107.7* 105.3*  PLT 172 150 146* 129* 025*   Basic Metabolic Panel: Recent Labs  Lab 07/29/18 2301 07/30/18 0314 07/31/18 0551 08/01/18 0518 08/02/18 0555 08/03/18 0440  NA  --  141 140 139 137 136  K  --  3.4* 4.2 3.7 3.5 4.7  CL  --  103 101 100 97* 96*  CO2  --  29 30 32 29 30  GLUCOSE  --  127* 108* 114* 158* 94  BUN  --  23 20 18 16 17   CREATININE  --  0.76 0.74 0.76 0.92 0.91  CALCIUM  --  8.6* 8.7* 9.0 8.7* 9.1  MG 2.0  --  2.0 2.1 2.0 2.0   GFR: Estimated Creatinine Clearance: 85 mL/min (by C-G formula based on SCr of 0.91 mg/dL). Liver Function Tests: Recent Labs  Lab 07/28/18 1211  08/01/18 0518  AST 32 19  ALT 44 33  ALKPHOS 82 77  BILITOT 1.0 1.2  PROT 7.1 6.4*  ALBUMIN 3.9 3.5   No results for input(s): LIPASE, AMYLASE in the last 168 hours. No results for input(s): AMMONIA in the last 168 hours. Coagulation Profile: No results for input(s): INR, PROTIME in the last 168 hours. Cardiac Enzymes: Recent Labs  Lab 07/30/18 0418 07/30/18 1018 07/30/18 1904  TROPONINI 0.07* 0.10* 0.09*   BNP (last 3 results) No results for input(s): PROBNP in the last 8760 hours. HbA1C: No results for input(s): HGBA1C in the last 72 hours. CBG: Recent Labs  Lab 07/30/18 0323  GLUCAP 129*   Lipid Profile: No results for input(s): CHOL, HDL, LDLCALC, TRIG, CHOLHDL, LDLDIRECT in the last 72 hours. Thyroid Function Tests: No results for input(s): TSH, T4TOTAL, FREET4, T3FREE, THYROIDAB in the last 72 hours. Anemia Panel: No results for input(s): VITAMINB12, FOLATE, FERRITIN, TIBC, IRON, RETICCTPCT in the last 72 hours. Sepsis Labs: No results for input(s): PROCALCITON, LATICACIDVEN in the last 168 hours.  Recent Results (from the past 240 hour(s))  MRSA PCR Screening     Status: None   Collection Time: 07/30/18  8:50 AM  Result  Value Ref Range Status   MRSA by PCR NEGATIVE NEGATIVE Final    Comment:        The GeneXpert MRSA Assay (FDA approved for NASAL specimens only), is one component of a comprehensive MRSA colonization surveillance program. It is not intended to diagnose MRSA infection nor to guide or monitor treatment for MRSA infections. Performed at [facility], [place] [address], [place], [place] [address]          Radiology Studies: Dg Chest 2 View  Result Date: 08/02/2018 CLINICAL DATA:  Shortness of breath following CPR several days ago EXAM: CHEST - 2 VIEW COMPARISON:  07/30/2018 FINDINGS: Cardiac shadow is stable at the upper limits of normal in size. Right chest wall port is noted. The lungs are well aerated without  focal infiltrate. No acute rib fractures are seen. No pneumothorax is noted. Pericardial calcifications are noted posteriorly. IMPRESSION: No acute abnormality noted. Electronically Signed   By: Inez Catalina M.D.   On: 08/02/2018 15:01        Scheduled Meds: . [START ON 08/07/2018] amiodarone  400 mg Oral Daily  . amiodarone  400 mg Oral BID  . apixaban  5 mg Oral BID  . carvedilol  3.125 mg Oral BID WC  . Chlorhexidine Gluconate Cloth  6 each Topical Daily  . furosemide  80 mg Intravenous BID  . levalbuterol  0.63 mg Nebulization Q6H  . loratadine  10 mg Oral Daily  . losartan  50 mg Oral Daily  . mometasone-formoterol  2 puff Inhalation BID  . nicotine  14 mg Transdermal Daily  . pantoprazole  40 mg Oral Daily  . potassium chloride  40 mEq Oral BID  . spironolactone  25 mg Oral Daily   Continuous Infusions: . sodium chloride Stopped (08/02/18 1424)     LOS: 5 days    Time spent: [place], MD [facility]  If 7PM-7AM, please contact night-coverage [email or web address] Password [facility] 08/03/2018, 7:58 AM

## 2018-08-04 DIAGNOSIS — I4891 Unspecified atrial fibrillation: Secondary | ICD-10-CM

## 2018-08-04 DIAGNOSIS — I480 Paroxysmal atrial fibrillation: Principal | ICD-10-CM

## 2018-08-04 LAB — BASIC METABOLIC PANEL
Anion gap: 9 (ref 5–15)
BUN: 20 mg/dL (ref 8–23)
CO2: 29 mmol/L (ref 22–32)
Calcium: 9.1 mg/dL (ref 8.9–10.3)
Chloride: 99 mmol/L (ref 98–111)
Creatinine, Ser: 0.9 mg/dL (ref 0.61–1.24)
GFR calc Af Amer: >60 mL/min (ref >60)
GFR calc non Af Amer: >60 mL/min (ref >60)
Glucose, Bld: 97 mg/dL (ref 70–99)
Potassium: 4.1 mmol/L (ref 3.5–5.1)
Sodium: 137 mmol/L (ref 135–145)

## 2018-08-04 LAB — CBC
HCT: 35.5 % — ABNORMAL LOW (ref 39.0–52.0)
Hemoglobin: 10.9 g/dL — ABNORMAL LOW (ref 13.0–17.0)
MCH: 32.2 pg (ref 26.0–34.0)
MCHC: 30.7 g/dL (ref 30.0–36.0)
MCV: 104.7 fL — ABNORMAL HIGH (ref 80.0–100.0)
Platelets: 135 10*3/uL — ABNORMAL LOW (ref 150–400)
RBC: 3.39 MIL/uL — ABNORMAL LOW (ref 4.22–5.81)
RDW: 16.3 % — ABNORMAL HIGH (ref 11.5–15.5)
WBC: 8.7 10*3/uL (ref 4.0–10.5)
nRBC: 0 % (ref 0.0–0.2)

## 2018-08-04 LAB — MAGNESIUM: Magnesium: 2 mg/dL (ref 1.7–2.4)

## 2018-08-04 MED ORDER — SODIUM CHLORIDE 0.9% FLUSH
3.0000 mL | Freq: Two times a day (BID) | INTRAVENOUS | Status: DC
  Administered 2018-08-05: 3 mL via INTRAVENOUS

## 2018-08-04 MED ORDER — ASPIRIN 81 MG PO CHEW
81.0000 mg | CHEWABLE_TABLET | ORAL | Status: AC
  Administered 2018-08-05: 81 mg via ORAL
  Filled 2018-08-04: qty 1

## 2018-08-04 MED ORDER — SODIUM CHLORIDE 0.9 % IV SOLN: 250.0000 mL | INTRAVENOUS | Status: DC | PRN

## 2018-08-04 MED ORDER — SODIUM CHLORIDE 0.9 % IV SOLN
INTRAVENOUS | Status: DC
  Administered 2018-08-05 (×2): via INTRAVENOUS

## 2018-08-04 MED ORDER — SODIUM CHLORIDE 0.9% FLUSH: 3.0000 mL | INTRAVENOUS | Status: DC | PRN

## 2018-08-04 NOTE — Progress Notes (Signed)
Spoke to patient with regards to using flutter and IS to help with deep breaths and secretions management.  Pt. Is reluctant and up to this point has not been interested in these devices.  Patient c/o pain with inspiration 2nd to chest compressions during this hospitalization.

## 2018-08-04 NOTE — Progress Notes (Signed)
PROGRESS NOTE    Brian White  OEV:[id number] DOB: [birth year] DOA: 07/28/2018 PCP: [name], MD   Brief Narrative:  68 year old with past medical history relevant for stage IIIb lung cancer, chronic systolic heart failure with EF of 25% (echo on 07/30/2018, wall motion abnormality on inferior wall), paroxysmal atrial fibrillation/flutter on apixaban, hypertension, COPD with chronic tobacco abuse who presented with atrial fibrillation with rapid ventricular response and subsequently developed ventricular tachycardia with arrest status post defibrillation and initiation of amiodarone. Echo on 07/31/2016 shows wall motion abnormalitiesCardiology suspected VT arrest originated from CHF exacerbation +/- ischemia. Pt is diuresed with iv lasix with good urine output, now switched to po lasix. Plan for cardiac cath on Monday.    Assessment & Plan:   Principal Problem:   Atrial fibrillation with RVR (HCC) Active Problems:   HTN (hypertension)   COPD (chronic obstructive pulmonary disease) (HCC)   Smoker   Cancer of lower lobe of left lung (HCC)   Anticoagulated   PAF (paroxysmal atrial fibrillation) (HCC)   Acute on chronic systolic heart failure (HCC)   Ventricular tachycardia ([place])   Primary malignant neoplasm of lung metastatic to other site [facility])   #) VT arrest: This was in the setting of being on a diltiazem drip for atrial fibrillation with rapid response. Per cardiology it is felt that the primary exacerbating factor was a heart failure exacerbation. - Echo performed on 07/31/2016 shows wall motion abnormalities -Cardiology consulted, appreciate recommendations, plan for possible cardiac catheterization on 08/05/2018 tentatively -Diuresis per below -continue  amiodarone to 400 mg by mouth daily -Keep potassium greater than 4 and magnesium greater than 2, telemetry monitoring. Pt does have frequent PVCs  #) Acute on chronic systolic heart failure exacerbation: Patient has  significant orthopnea and bilateral lower extremity edema.  Echo does show his EF is preserved with some wall motion abnormality. - Continue carvedilol 3.125 mg twice daily, losartan 50 mg daily; spironolactone 25 mg daily -cardiology has changed iv lasix to po lasix 40 mg qd,  strict ins and outs, weigh daily, sodium restricted diet -- compression socks  #) Paroxysmal atrial fibrillation/flutter with RVR: Patient cardioverted chemically with amiodarone infusion. -Continue beta-blocker -Continue apixaban 5 mg twice daily; Eliquis on hold due to cardiac cath tomorrow -Telemetry  #) COPD with chronic tobacco abuse: -Continue ICS/LABA -continue PRN bronchodilators -Continue nicotine patch --incentive spirometry  #) Stage IIIb non-small cell lung cancer: Patient last received chemotherapy on 07/19/2018 with pembrolizumab, pemetrexed, carboplatin but apparently had a reaction to the chemotherapy -Oncology notified that patient is hospitalized  Fluids: Restricted Electrolytes: Monitor and supplement Nutrition: Heart healthy diet  Prophylaxis: On apixaban  Disposition: Pending further diuresis and possible cardiac catheterization  Full code  Consultants:   Cardiology  Procedures:  07/30/2018 echo: - Left ventricle: Diffuse hypokinesis worse in the inferior wall.   The cavity size was moderately dilated. Wall thickness was   normal. The estimated ejection fraction was 25%. The study is not   technically sufficient to allow evaluation of LV diastolic   function. - Aortic valve: Sclerosis without stenosis. - Mitral valve: Calcified annulus. Mildly thickened leaflets . - Left atrium: The atrium was moderately dilated.  - Atrial septum: No defect or patent foramen ovale was identified.  Antimicrobials:   None   Subjective: Patient is sitting in the recliner. Due to CHF, he sleeps in recliner everyday but no overnight acute events.  His orthopnea is improved and his lower extremity  edema is decreased. No chest pain,  SOB or palpitation.  He continues to report a painful cough with chest wall pain. Telemetry showed sinus rhythm with multiple/frequent PVCs. Cr 0.9. K 4.1.   Objective: Vitals:   08/03/18 2018 08/03/18 2209 08/04/18 0357 08/04/18 0519  BP:  112/67  117/66  Pulse:  (!) 55  (!) 57  Resp:  14  18  Temp:  98.4 F (36.9 C)  98.2 F (36.8 C)  TempSrc:  Oral  Oral  SpO2: 94% 95% 93% 93%  Weight:      Height:        Intake/Output Summary (Last 24 hours) at 08/04/2018 0746 Last data filed at 08/04/2018 0600 Gross per 24 hour  Intake 420 ml  Output 0 ml  Net 420 ml   Filed Weights   08/01/18 0830 08/02/18 1500 08/03/18 0441  Weight: 89.1 kg 86.7 kg 85.9 kg    Examination:  General exam: Appears calm and comfortable; chronic ill appearance Respiratory system: No increased work of breathing, no wheezing, no crackles, + scattered rhonchi Cardiovascular system: Distant heart sounds, regular rate and rhythm, no murmurs Gastrointestinal system: Soft, nondistended, no rebound or guarding, plus bowel sounds. Central nervous system: Grossly intact, moving all extremities Extremities: 2+ lower extremity edema; wearing compression socks Skin: Port site is clean dry and intact Psychiatry: Judgement and insight appear normal. Mood & affect appropriate.     Data Reviewed: I have personally reviewed following labs and imaging studies  CBC: Recent Labs  Lab 07/31/18 0551 08/01/18 0518 08/02/18 0555 08/03/18 0440 08/04/18 0339  WBC 10.4 11.3* 10.4 9.4 8.7  HGB 10.2* 10.8* 10.6* 11.2* 10.9*  HCT 33.2* 35.2* 34.8* 35.9* 35.5*  MCV 108.5* 105.7* 107.7* 105.3* 104.7*  PLT 150 146* 129* 124* 409*   Basic Metabolic Panel: Recent Labs  Lab 07/29/18 2301  07/31/18 0551 08/01/18 0518 08/02/18 0555 08/03/18 0440 08/04/18 0339  NA  --    < > 140 139 137 136 137  K  --    < > 4.2 3.7 3.5 4.7 4.1  CL  --    < > 101 100 97* 96* 99  CO2  --    < > 30 32 29  30 29   GLUCOSE  --    < > 108* 114* 158* 94 97  BUN  --    < > 20 18 16 17 20   CREATININE  --    < > 0.74 0.76 0.92 0.91 0.90  CALCIUM  --    < > 8.7* 9.0 8.7* 9.1 9.1  MG 2.0  --  2.0 2.1 2.0 2.0  --    < > = values in this interval not displayed.   GFR: Estimated Creatinine Clearance: 86 mL/min (by C-G formula based on SCr of 0.9 mg/dL). Liver Function Tests: Recent Labs  Lab 07/28/18 1211 08/01/18 0518  AST 32 19  ALT 44 33  ALKPHOS 82 77  BILITOT 1.0 1.2  PROT 7.1 6.4*  ALBUMIN 3.9 3.5   No results for input(s): LIPASE, AMYLASE in the last 168 hours. No results for input(s): AMMONIA in the last 168 hours. Coagulation Profile: No results for input(s): INR, PROTIME in the last 168 hours. Cardiac Enzymes: Recent Labs  Lab 07/30/18 0418 07/30/18 1018 07/30/18 1904  TROPONINI 0.07* 0.10* 0.09*   BNP (last 3 results) No results for input(s): PROBNP in the last 8760 hours. HbA1C: No results for input(s): HGBA1C in the last 72 hours. CBG: Recent Labs  Lab 07/30/18 0323  GLUCAP 129*  Lipid Profile: No results for input(s): CHOL, HDL, LDLCALC, TRIG, CHOLHDL, LDLDIRECT in the last 72 hours. Thyroid Function Tests: No results for input(s): TSH, T4TOTAL, FREET4, T3FREE, THYROIDAB in the last 72 hours. Anemia Panel: No results for input(s): VITAMINB12, FOLATE, FERRITIN, TIBC, IRON, RETICCTPCT in the last 72 hours. Sepsis Labs: No results for input(s): PROCALCITON, LATICACIDVEN in the last 168 hours.  Recent Results (from the past 240 hour(s))  MRSA PCR Screening     Status: None   Collection Time: 07/30/18  8:50 AM  Result Value Ref Range Status   MRSA by PCR NEGATIVE NEGATIVE Final    Comment:        The GeneXpert MRSA Assay (FDA approved for NASAL specimens only), is one component of a comprehensive MRSA colonization surveillance program. It is not intended to diagnose MRSA infection nor to guide or monitor treatment for MRSA infections. Performed at  [facility], [place] [address], [place], [place] [address]          Radiology Studies: Dg Chest 2 View  Result Date: 08/02/2018 CLINICAL DATA:  Shortness of breath following CPR several days ago EXAM: CHEST - 2 VIEW COMPARISON:  07/30/2018 FINDINGS: Cardiac shadow is stable at the upper limits of normal in size. Right chest wall port is noted. The lungs are well aerated without focal infiltrate. No acute rib fractures are seen. No pneumothorax is noted. Pericardial calcifications are noted posteriorly. IMPRESSION: No acute abnormality noted. Electronically Signed   By: Inez Catalina M.D.   On: 08/02/2018 15:01        Scheduled Meds: . [START ON 08/07/2018] amiodarone  400 mg Oral Daily  . amiodarone  400 mg Oral BID  . carvedilol  3.125 mg Oral BID WC  . Chlorhexidine Gluconate Cloth  6 each Topical Daily  . furosemide  40 mg Oral Daily  . levalbuterol  0.63 mg Nebulization TID  . loratadine  10 mg Oral Daily  . losartan  50 mg Oral Daily  . mometasone-formoterol  2 puff Inhalation BID  . nicotine  14 mg Transdermal Daily  . pantoprazole  40 mg Oral Daily  . potassium chloride  20 mEq Oral Daily  . spironolactone  25 mg Oral Daily   Continuous Infusions: . sodium chloride Stopped (08/02/18 1424)     LOS: 6 days    Time spent: 35 min    Paticia Stack, MD [facility]  If 7PM-7AM, please contact night-coverage [email or web address] Password [facility] 08/04/2018, 7:46 AM

## 2018-08-04 NOTE — Progress Notes (Signed)
Progress Note  Patient Name: Brian White Date of Encounter: 08/04/2018  Primary Cardiologist: Kirk Ruths, MD   Subjective   68 year old gentleman with a history of severely depressed left ventricular systolic function, persistent atrial flutter, COPD, lung cancer and he was admitted with respiratory failure and ultimately had a cardiac arrest with ventricular tachycardia.   He is on a scheduled for heart catheterization tomorrow.  Eliquis is currently on hold.    Inpatient Medications    Scheduled Meds: . [START ON 08/07/2018] amiodarone  400 mg Oral Daily  . amiodarone  400 mg Oral BID  . carvedilol  3.125 mg Oral BID WC  . Chlorhexidine Gluconate Cloth  6 each Topical Daily  . furosemide  40 mg Oral Daily  . levalbuterol  0.63 mg Nebulization TID  . loratadine  10 mg Oral Daily  . losartan  50 mg Oral Daily  . mometasone-formoterol  2 puff Inhalation BID  . nicotine  14 mg Transdermal Daily  . pantoprazole  40 mg Oral Daily  . potassium chloride  20 mEq Oral Daily  . spironolactone  25 mg Oral Daily   Continuous Infusions: . sodium chloride Stopped (08/02/18 1424)   PRN Meds: acetaminophen, guaiFENesin, ipratropium-albuterol, magnesium hydroxide, mirtazapine, morphine injection, ondansetron (ZOFRAN) IV, oxyCODONE, sodium chloride flush   Vital Signs    Vitals:   08/03/18 2209 08/04/18 0357 08/04/18 0519 08/04/18 0812  BP: 112/67  117/66   Pulse: (!) 55  (!) 57   Resp: 14  18   Temp: 98.4 F (36.9 C)  98.2 F (36.8 C)   TempSrc: Oral  Oral   SpO2: 95% 93% 93% 95%  Weight:      Height:        Intake/Output Summary (Last 24 hours) at 08/04/2018 0857 Last data filed at 08/04/2018 0600 Gross per 24 hour  Intake 180 ml  Output 0 ml  Net 180 ml   Last 3 Weights 08/03/2018 08/02/2018 08/01/2018  Weight (lbs) 189 lb 4.8 oz 191 lb 2.2 oz 196 lb 6.9 oz  Weight (kg) 85.866 kg 86.7 kg 89.1 kg      Telemetry    - Personally Reviewed  ECG       Physical  Exam    GEN:  Middle-aged gentleman.  No acute distress. Neck:  JVD. Cardiac:  Good rate.Marland Kitchen  Respiratory:  Bilateral wheezing and congestion. GI: Soft, nontender, non-distended  MS:  No edema. Neuro:  Nonfocal  Psych: Normal affect   Labs    Chemistry Recent Labs  Lab 07/28/18 1211  08/01/18 0518 08/02/18 0555 08/03/18 0440 08/04/18 0339  NA 141   < > 139 137 136 137  K 4.1   < > 3.7 3.5 4.7 4.1  CL 103   < > 100 97* 96* 99  CO2 28   < > 32 29 30 29   GLUCOSE 143*   < > 114* 158* 94 97  BUN 29*   < > 18 16 17 20   CREATININE 0.87   < > 0.76 0.92 0.91 0.90  CALCIUM 9.3   < > 9.0 8.7* 9.1 9.1  PROT 7.1  --  6.4*  --   --   --   ALBUMIN 3.9  --  3.5  --   --   --   AST 32  --  19  --   --   --   ALT 44  --  33  --   --   --  ALKPHOS 82  --  77  --   --   --   BILITOT 1.0  --  1.2  --   --   --   GFRNONAA >60   < > >60 >60 >60 >60  GFRAA >60   < > >60 >60 >60 >60  ANIONGAP 10   < > 7 11 10 9    < > = values in this interval not displayed.     Hematology Recent Labs  Lab 08/02/18 0555 08/03/18 0440 08/04/18 0339  WBC 10.4 9.4 8.7  RBC 3.23* 3.41* 3.39*  HGB 10.6* 11.2* 10.9*  HCT 34.8* 35.9* 35.5*  MCV 107.7* 105.3* 104.7*  MCH 32.8 32.8 32.2  MCHC 30.5 31.2 30.7  RDW 16.4* 16.6* 16.3*  PLT 129* 124* 135*    Cardiac Enzymes Recent Labs  Lab 07/30/18 0418 07/30/18 1018 07/30/18 1904  TROPONINI 0.07* 0.10* 0.09*   No results for input(s): TROPIPOC in the last 168 hours.   BNPNo results for input(s): BNP, PROBNP in the last 168 hours.   DDimer No results for input(s): DDIMER in the last 168 hours.   Radiology    Dg Chest 2 View  Result Date: 08/02/2018 CLINICAL DATA:  Shortness of breath following CPR several days ago EXAM: CHEST - 2 VIEW COMPARISON:  07/30/2018 FINDINGS: Cardiac shadow is stable at the upper limits of normal in size. Right chest wall port is noted. The lungs are well aerated without focal infiltrate. No acute rib fractures are seen. No  pneumothorax is noted. Pericardial calcifications are noted posteriorly. IMPRESSION: No acute abnormality noted. Electronically Signed   By: Inez Catalina M.D.   On: 08/02/2018 15:01    Cardiac Studies  *  Patient Profile     68 y.o. male admitted with respiratory arrest and ventricular fibrillation.  He was defibrillated successfully.  Assessment & Plan    1.  Cardiac arrest: The patient had ventricular tachycardia during his cardiac arrest.  He was defibrillated.  He is scheduled to have a heart catheterization tomorrow. We discussed the risks, benefits, options of heart catheterization.  He understands and agrees to proceed..  2.  Chronic combined systolic and diastolic congestive heart failure: Continue current medications.   For questions or updates, please contact [place] Please consult [email or web address] for contact info under        Signed, Mertie Moores, MD  08/04/2018, 8:57 AM

## 2018-08-04 NOTE — H&P (View-Only) (Signed)
Progress Note  Patient Name: Brian White Date of Encounter: 08/04/2018  Primary Cardiologist: Kirk Ruths, MD   Subjective   68 year old gentleman with a history of severely depressed left ventricular systolic function, persistent atrial flutter, COPD, lung cancer and he was admitted with respiratory failure and ultimately had a cardiac arrest with ventricular tachycardia.   He is on a scheduled for heart catheterization tomorrow.  Eliquis is currently on hold.    Inpatient Medications    Scheduled Meds: . [START ON 08/07/2018] amiodarone  400 mg Oral Daily  . amiodarone  400 mg Oral BID  . carvedilol  3.125 mg Oral BID WC  . Chlorhexidine Gluconate Cloth  6 each Topical Daily  . furosemide  40 mg Oral Daily  . levalbuterol  0.63 mg Nebulization TID  . loratadine  10 mg Oral Daily  . losartan  50 mg Oral Daily  . mometasone-formoterol  2 puff Inhalation BID  . nicotine  14 mg Transdermal Daily  . pantoprazole  40 mg Oral Daily  . potassium chloride  20 mEq Oral Daily  . spironolactone  25 mg Oral Daily   Continuous Infusions: . sodium chloride Stopped (08/02/18 1424)   PRN Meds: acetaminophen, guaiFENesin, ipratropium-albuterol, magnesium hydroxide, mirtazapine, morphine injection, ondansetron (ZOFRAN) IV, oxyCODONE, sodium chloride flush   Vital Signs    Vitals:   08/03/18 2209 08/04/18 0357 08/04/18 0519 08/04/18 0812  BP: 112/67  117/66   Pulse: (!) 55  (!) 57   Resp: 14  18   Temp: 98.4 F (36.9 C)  98.2 F (36.8 C)   TempSrc: Oral  Oral   SpO2: 95% 93% 93% 95%  Weight:      Height:        Intake/Output Summary (Last 24 hours) at 08/04/2018 0857 Last data filed at 08/04/2018 0600 Gross per 24 hour  Intake 180 ml  Output 0 ml  Net 180 ml   Last 3 Weights 08/03/2018 08/02/2018 08/01/2018  Weight (lbs) 189 lb 4.8 oz 191 lb 2.2 oz 196 lb 6.9 oz  Weight (kg) 85.866 kg 86.7 kg 89.1 kg      Telemetry    - Personally Reviewed  ECG       Physical  Exam    GEN:  Middle-aged gentleman.  No acute distress. Neck:  JVD. Cardiac:  Good rate.Marland Kitchen  Respiratory:  Bilateral wheezing and congestion. GI: Soft, nontender, non-distended  MS:  No edema. Neuro:  Nonfocal  Psych: Normal affect   Labs    Chemistry Recent Labs  Lab 07/28/18 1211  08/01/18 0518 08/02/18 0555 08/03/18 0440 08/04/18 0339  NA 141   < > 139 137 136 137  K 4.1   < > 3.7 3.5 4.7 4.1  CL 103   < > 100 97* 96* 99  CO2 28   < > 32 29 30 29   GLUCOSE 143*   < > 114* 158* 94 97  BUN 29*   < > 18 16 17 20   CREATININE 0.87   < > 0.76 0.92 0.91 0.90  CALCIUM 9.3   < > 9.0 8.7* 9.1 9.1  PROT 7.1  --  6.4*  --   --   --   ALBUMIN 3.9  --  3.5  --   --   --   AST 32  --  19  --   --   --   ALT 44  --  33  --   --   --  ALKPHOS 82  --  77  --   --   --   BILITOT 1.0  --  1.2  --   --   --   GFRNONAA >60   < > >60 >60 >60 >60  GFRAA >60   < > >60 >60 >60 >60  ANIONGAP 10   < > 7 11 10 9    < > = values in this interval not displayed.     Hematology Recent Labs  Lab 08/02/18 0555 08/03/18 0440 08/04/18 0339  WBC 10.4 9.4 8.7  RBC 3.23* 3.41* 3.39*  HGB 10.6* 11.2* 10.9*  HCT 34.8* 35.9* 35.5*  MCV 107.7* 105.3* 104.7*  MCH 32.8 32.8 32.2  MCHC 30.5 31.2 30.7  RDW 16.4* 16.6* 16.3*  PLT 129* 124* 135*    Cardiac Enzymes Recent Labs  Lab 07/30/18 0418 07/30/18 1018 07/30/18 1904  TROPONINI 0.07* 0.10* 0.09*   No results for input(s): TROPIPOC in the last 168 hours.   BNPNo results for input(s): BNP, PROBNP in the last 168 hours.   DDimer No results for input(s): DDIMER in the last 168 hours.   Radiology    Dg Chest 2 View  Result Date: 08/02/2018 CLINICAL DATA:  Shortness of breath following CPR several days ago EXAM: CHEST - 2 VIEW COMPARISON:  07/30/2018 FINDINGS: Cardiac shadow is stable at the upper limits of normal in size. Right chest wall port is noted. The lungs are well aerated without focal infiltrate. No acute rib fractures are seen. No  pneumothorax is noted. Pericardial calcifications are noted posteriorly. IMPRESSION: No acute abnormality noted. Electronically Signed   By: Inez Catalina M.D.   On: 08/02/2018 15:01    Cardiac Studies  *  Patient Profile     68 y.o. male admitted with respiratory arrest and ventricular fibrillation.  He was defibrillated successfully.  Assessment & Plan    1.  Cardiac arrest: The patient had ventricular tachycardia during his cardiac arrest.  He was defibrillated.  He is scheduled to have a heart catheterization tomorrow. We discussed the risks, benefits, options of heart catheterization.  He understands and agrees to proceed..  2.  Chronic combined systolic and diastolic congestive heart failure: Continue current medications.   For questions or updates, please contact [place] Please consult [email or web address] for contact info under        Signed, Mertie Moores, MD  08/04/2018, 8:57 AM

## 2018-08-05 ENCOUNTER — Encounter (HOSPITAL_COMMUNITY)
Admission: EM | Disposition: A | Discharge status: Home / Self Care | Payer: Self-pay | Source: Home / Self Care | Attending: Internal Medicine

## 2018-08-05 DIAGNOSIS — I5021 Acute systolic (congestive) heart failure: Secondary | ICD-10-CM

## 2018-08-05 HISTORY — PX: LEFT HEART CATH AND CORONARY ANGIOGRAPHY: CATH118249

## 2018-08-05 LAB — BASIC METABOLIC PANEL
Anion gap: 8 (ref 5–15)
BUN: 21 mg/dL (ref 8–23)
CO2: 28 mmol/L (ref 22–32)
Calcium: 9 mg/dL (ref 8.9–10.3)
Chloride: 101 mmol/L (ref 98–111)
Creatinine, Ser: 0.87 mg/dL (ref 0.61–1.24)
GFR calc Af Amer: >60 mL/min (ref >60)
GFR calc non Af Amer: >60 mL/min (ref >60)
Glucose, Bld: 97 mg/dL (ref 70–99)
Potassium: 4.2 mmol/L (ref 3.5–5.1)
Sodium: 137 mmol/L (ref 135–145)

## 2018-08-05 LAB — CBC
HCT: 35 % — ABNORMAL LOW (ref 39.0–52.0)
Hemoglobin: 10.8 g/dL — ABNORMAL LOW (ref 13.0–17.0)
MCH: 32 pg (ref 26.0–34.0)
MCHC: 30.9 g/dL (ref 30.0–36.0)
MCV: 103.9 fL — ABNORMAL HIGH (ref 80.0–100.0)
Platelets: 131 10*3/uL — ABNORMAL LOW (ref 150–400)
RBC: 3.37 MIL/uL — ABNORMAL LOW (ref 4.22–5.81)
RDW: 16 % — ABNORMAL HIGH (ref 11.5–15.5)
WBC: 10.2 10*3/uL (ref 4.0–10.5)
nRBC: 0 % (ref 0.0–0.2)

## 2018-08-05 LAB — MAGNESIUM: Magnesium: 2.2 mg/dL (ref 1.7–2.4)

## 2018-08-05 SURGERY — LEFT HEART CATH AND CORONARY ANGIOGRAPHY
Anesthesia: LOCAL

## 2018-08-05 MED ORDER — HEPARIN (PORCINE) 25000 UT/250ML-% IV SOLN
1000.0000 [IU]/h | INTRAVENOUS | Status: DC
  Administered 2018-08-05: 1000 [IU]/h via INTRAVENOUS
  Filled 2018-08-05 (×2): qty 250

## 2018-08-05 MED ORDER — HEPARIN (PORCINE) IN NACL 1000-0.9 UT/500ML-% IV SOLN
INTRAVENOUS | Status: DC | PRN
  Administered 2018-08-05 (×2): 500 mL

## 2018-08-05 MED ORDER — IOHEXOL 350 MG/ML SOLN
INTRAVENOUS | Status: DC | PRN
  Administered 2018-08-05: 50 mL

## 2018-08-05 MED ORDER — FENTANYL CITRATE (PF) 100 MCG/2ML IJ SOLN
INTRAMUSCULAR | Status: DC | PRN
  Administered 2018-08-05: 25 ug via INTRAVENOUS

## 2018-08-05 MED ORDER — SODIUM CHLORIDE 0.9% FLUSH: 3.0000 mL | INTRAVENOUS | Status: DC | PRN

## 2018-08-05 MED ORDER — FUROSEMIDE 40 MG PO TABS: 40.0000 mg | ORAL_TABLET | Freq: Every day | ORAL | Status: DC

## 2018-08-05 MED ORDER — LIDOCAINE HCL (PF) 1 % IJ SOLN
INTRAMUSCULAR | Status: DC | PRN
  Administered 2018-08-05: 2 mL

## 2018-08-05 MED ORDER — LEVALBUTEROL HCL 0.63 MG/3ML IN NEBU
0.6300 mg | INHALATION_SOLUTION | Freq: Three times a day (TID) | RESPIRATORY_TRACT | Status: DC
  Administered 2018-08-05 – 2018-08-06 (×2): 0.63 mg via RESPIRATORY_TRACT
  Filled 2018-08-05 (×2): qty 3

## 2018-08-05 MED ORDER — VERAPAMIL HCL 2.5 MG/ML IV SOLN
INTRAVENOUS | Status: DC | PRN
  Administered 2018-08-05: 10 mL via INTRA_ARTERIAL

## 2018-08-05 MED ORDER — MIDAZOLAM HCL 2 MG/2ML IJ SOLN
INTRAMUSCULAR | Status: DC | PRN
  Administered 2018-08-05: 0.5 mg via INTRAVENOUS

## 2018-08-05 MED ORDER — MIDAZOLAM HCL 2 MG/2ML IJ SOLN
INTRAMUSCULAR | Status: AC
  Filled 2018-08-05: qty 2

## 2018-08-05 MED ORDER — VERAPAMIL HCL 2.5 MG/ML IV SOLN
INTRAVENOUS | Status: AC
  Filled 2018-08-05: qty 2

## 2018-08-05 MED ORDER — SODIUM CHLORIDE 0.9 % IV SOLN: 250.0000 mL | INTRAVENOUS | Status: DC | PRN

## 2018-08-05 MED ORDER — SODIUM CHLORIDE 0.9% FLUSH
3.0000 mL | Freq: Two times a day (BID) | INTRAVENOUS | Status: DC
  Administered 2018-08-05 – 2018-08-06 (×3): 3 mL via INTRAVENOUS

## 2018-08-05 MED ORDER — LEVALBUTEROL HCL 0.63 MG/3ML IN NEBU: 0.6300 mg | INHALATION_SOLUTION | Freq: Two times a day (BID) | RESPIRATORY_TRACT | Status: DC

## 2018-08-05 MED ORDER — LIDOCAINE HCL (PF) 1 % IJ SOLN
INTRAMUSCULAR | Status: AC
  Filled 2018-08-05: qty 30

## 2018-08-05 MED ORDER — FENTANYL CITRATE (PF) 100 MCG/2ML IJ SOLN
INTRAMUSCULAR | Status: AC
  Filled 2018-08-05: qty 2

## 2018-08-05 MED ORDER — HEPARIN SODIUM (PORCINE) 1000 UNIT/ML IJ SOLN
INTRAMUSCULAR | Status: DC | PRN
  Administered 2018-08-05: 4500 [IU] via INTRAVENOUS

## 2018-08-05 MED ORDER — HEPARIN (PORCINE) IN NACL 1000-0.9 UT/500ML-% IV SOLN
INTRAVENOUS | Status: AC
  Filled 2018-08-05: qty 1000

## 2018-08-05 SURGICAL SUPPLY — 10 items
CATH 5FR JL3.5 JR4 ANG PIG MP (CATHETERS) ×1 IMPLANT
DEVICE RAD COMP TR BAND LRG (VASCULAR PRODUCTS) ×1 IMPLANT
ELECT DEFIB PAD ADLT CADENCE (PAD) ×1 IMPLANT
GLIDESHEATH SLEND SS 6F .021 (SHEATH) ×1 IMPLANT
GUIDEWIRE INQWIRE 1.5J.035X260 (WIRE) IMPLANT
INQWIRE 1.5J .035X260CM (WIRE) ×2
KIT HEART LEFT (KITS) ×2 IMPLANT
PACK CARDIAC CATHETERIZATION (CUSTOM PROCEDURE TRAY) ×2 IMPLANT
TRANSDUCER W/STOPCOCK (MISCELLANEOUS) ×2 IMPLANT
TUBING CIL FLEX 10 FLL-RA (TUBING) ×2 IMPLANT

## 2018-08-05 NOTE — Progress Notes (Addendum)
Progress Note  Patient Name: Brian White Date of Encounter: 08/05/2018  Primary Cardiologist: Kirk Ruths, MD   Subjective   Pt still has chest soreness related to CPR but no substernal chest pain or shortness of breath. He was able to sleep in the bed all night and denies orthopnea. He does have lower extremity edema.   Inpatient Medications    Scheduled Meds: . [START ON 08/07/2018] amiodarone  400 mg Oral Daily  . amiodarone  400 mg Oral BID  . aspirin  81 mg Oral Pre-Cath  . carvedilol  3.125 mg Oral BID WC  . Chlorhexidine Gluconate Cloth  6 each Topical Daily  . furosemide  40 mg Oral Daily  . levalbuterol  0.63 mg Nebulization TID  . loratadine  10 mg Oral Daily  . losartan  50 mg Oral Daily  . mometasone-formoterol  2 puff Inhalation BID  . nicotine  14 mg Transdermal Daily  . pantoprazole  40 mg Oral Daily  . potassium chloride  20 mEq Oral Daily  . sodium chloride flush  3 mL Intravenous Q12H  . spironolactone  25 mg Oral Daily   Continuous Infusions: . sodium chloride 10 mL/hr at 08/04/18 1337  . sodium chloride    . sodium chloride 40 mL/hr at 08/05/18 0552   PRN Meds: sodium chloride, acetaminophen, guaiFENesin, ipratropium-albuterol, magnesium hydroxide, mirtazapine, morphine injection, ondansetron (ZOFRAN) IV, oxyCODONE, sodium chloride flush, sodium chloride flush   Vital Signs    Vitals:   08/04/18 2125 08/05/18 0552 08/05/18 0553 08/05/18 0749  BP:  111/72    Pulse:  (!) 56    Resp:  20    Temp:  98.4 F (36.9 C)    TempSrc:  Oral    SpO2: 97% 97%  96%  Weight:   84.6 kg   Height:        Intake/Output Summary (Last 24 hours) at 08/05/2018 0819 Last data filed at 08/05/2018 0600 Gross per 24 hour  Intake 828.44 ml  Output 525 ml  Net 303.44 ml   Last 3 Weights 08/05/2018 08/04/2018 08/03/2018  Weight (lbs) 186 lb 8 oz 188 lb 11.4 oz 189 lb 4.8 oz  Weight (kg) 84.596 kg 85.6 kg 85.866 kg      Telemetry    Sinus rhythm with PVCs -  Personally Reviewed  ECG    No new tracings - Personally Reviewed  Physical Exam   GEN: No acute distress.   Neck: No JVD Cardiac: RRR, no murmurs, rubs, or gallops.  Respiratory: Clear to auscultation bilaterally except for few scattered end expiratory wheezes. GI: Soft, nontender, non-distended  MS: 1+ lower leg up to mid calf edema; No deformity. Neuro:  Nonfocal  Psych: Normal affect   Labs    Chemistry Recent Labs  Lab 08/01/18 0518  08/03/18 0440 08/04/18 0339 08/05/18 0401  NA 139   < > 136 137 137  K 3.7   < > 4.7 4.1 4.2  CL 100   < > 96* 99 101  CO2 32   < > 30 29 28   GLUCOSE 114*   < > 94 97 97  BUN 18   < > 17 20 21   CREATININE 0.76   < > 0.91 0.90 0.87  CALCIUM 9.0   < > 9.1 9.1 9.0  PROT 6.4*  --   --   --   --   ALBUMIN 3.5  --   --   --   --   AST  19  --   --   --   --   ALT 33  --   --   --   --   ALKPHOS 77  --   --   --   --   BILITOT 1.2  --   --   --   --   GFRNONAA >60   < > >60 >60 >60  GFRAA >60   < > >60 >60 >60  ANIONGAP 7   < > 10 9 8    < > = values in this interval not displayed.     Hematology Recent Labs  Lab 08/03/18 0440 08/04/18 0339 08/05/18 0401  WBC 9.4 8.7 10.2  RBC 3.41* 3.39* 3.37*  HGB 11.2* 10.9* 10.8*  HCT 35.9* 35.5* 35.0*  MCV 105.3* 104.7* 103.9*  MCH 32.8 32.2 32.0  MCHC 31.2 30.7 30.9  RDW 16.6* 16.3* 16.0*  PLT 124* 135* 131*    Cardiac Enzymes Recent Labs  Lab 07/30/18 0418 07/30/18 1018 07/30/18 1904  TROPONINI 0.07* 0.10* 0.09*   No results for input(s): TROPIPOC in the last 168 hours.   BNPNo results for input(s): BNP, PROBNP in the last 168 hours.   DDimer No results for input(s): DDIMER in the last 168 hours.   Radiology    No results found.  Cardiac Studies   Echo 07/30/2017 Study Conclusions  - Left ventricle: Diffuse hypokinesis worse in the inferior wall.   The cavity size was moderately dilated. Wall thickness was   normal. The estimated ejection fraction was 25%. The  study is not   technically sufficient to allow evaluation of LV diastolic   function. - Aortic valve: Sclerosis without stenosis. - Mitral valve: Calcified annulus. Mildly thickened leaflets . - Left atrium: The atrium was moderately dilated. - Atrial septum: No defect or patent foramen ovale was identified.  Patient Profile     68 y.o. male with a history of severely depressed left ventricular systolic function, persistent atrial flutter, COPD, lung cancer and he was admitted with respiratory failure and ultimately had a cardiac arrest with ventricular tachycardia.  Assessment & Plan    1.  Cardiac arrest: Patient presented on 07/29/2018 status post cardiac arrest with ventricular tachycardia during arrest.  He was defibrillated and received CPR.  His chest is sore from CPR but no substernal chest discomfort.  He is for cardiac catheterization today to definitively evaluate coronary arteries and possible ischemia causing his VT.  Procedure discussed with patient and his wife, questions answered.  Risks and benefits discussed by Dr. Acie Fredrickson.  2.  Ventricular tachycardia: Patient continues on amiodarone 400 mg daily.  No VT noted on telemetry.  Patient is in sinus rhythm with occasional-frequent PVCs.  Potassium and magnesium are within normal limits.   3.  Chronic combined systolic and diastolic heart failure: Echocardiogram done on 1/14 showed EF 25% which is consistent with prior. He continues on carvedilol, losartan, Spironolactone and Lasix 40 mg p.o. daily.  Patient has no significant shortness of breath, no orthopnea.  He does have 1+ edema up to mid calf.  His weight is down 7 pounds since admission.      For questions or updates, please contact [place] Please consult [email or web address] for contact info under     Signed, Daune Perch, NP  08/05/2018, 8:19 AM     Attending Note:   The patient was seen and examined.  Agree with assessment and plan as noted above.  Changes made  to the above  note as needed.  Patient seen and independently examined with Pecolia Ades, NP .   We discussed all aspects of the encounter. I agree with the assessment and plan as stated above.  1.   VT:  In the setting of a respiratory arrest.  Has had an old Inf. MI but no culprit  lesions to have caused ischemia Continue amiodarone   2.   Acute on chronic combined CHF :   EF 25% Continue Coreg, Losartan, lasix  Cont. Amiodarone    I have spent a total of 40 minutes with patient reviewing hospital  notes , telemetry, EKGs, labs and examining patient as well as establishing an assessment and plan that was discussed with the patient. > 50% of time was spent in direct patient care.    Thayer Headings, Brooke Bonito., MD, [facility] 08/05/2018, 2:59 PM [address],  [place] Pager [phone]

## 2018-08-05 NOTE — Progress Notes (Signed)
TR BAND REMOVAL  LOCATION:    Radial rt wrist  DEFLATED PER PROTOCOL:   yes  TIME BAND OFF / DRESSING APPLIED:    1615/gauze and tegaderm  SITE UPON ARRIVAL:    Level 0  SITE AFTER BAND REMOVAL:    Level 0  CIRCULATION SENSATION AND MOVEMENT:    Within Normal Limits :  yes  COMMENTS:   Rt hand and fingers pink, rt arm resting on pillow

## 2018-08-05 NOTE — Progress Notes (Signed)
PROGRESS NOTE    Brian White  VVO:[id number] DOB: [birth year] DOA: 07/28/2018 PCP: [name], MD   Brief Narrative:  68 year old with past medical history relevant for stage IIIb lung cancer, chronic systolic heart failure with EF of 25% (echo on 07/30/2018, wall motion abnormality on inferior wall), paroxysmal atrial fibrillation/flutter on apixaban, hypertension, COPD with chronic tobacco abuse who presented with atrial fibrillation with rapid ventricular response and subsequently developed ventricular tachycardia with arrest status post defibrillation and initiation of amiodarone. Echo on 07/31/2016 shows wall motion abnormalities. Cardiology suspected VT arrest originated from CHF exacerbation +/- ischemia.   Assessment & Plan:   Principal Problem:   Atrial fibrillation with RVR (HCC) Active Problems:   HTN (hypertension)   COPD (chronic obstructive pulmonary disease) (HCC)   Smoker   Cancer of lower lobe of left lung (HCC)   Anticoagulated   PAF (paroxysmal atrial fibrillation) (HCC)   Acute on chronic systolic heart failure (HCC)   Ventricular tachycardia ([place])   Primary malignant neoplasm of lung metastatic to other site [facility])   #) VT arrest: This was in the setting of being on a diltiazem drip for atrial fibrillation with rapid response. Per cardiology it is felt that the primary exacerbating factor was a heart failure exacerbation. - Echo performed on 07/31/2016 shows wall motion abnormalities -Cardiology consulted, appreciate recommendations, plan for  cardiac catheterization on 08/05/2018, today -Diuresis per below -continue  amiodarone to 400 mg by mouth daily -Keep potassium greater than 4 and magnesium greater than 2, telemetry monitoring. Pt does have frequent PVCs  #) Acute on chronic systolic heart failure exacerbation: This appears to have largely resolved with diminishing lower extremity edema and resolution of orthopnea. - Continue carvedilol 3.125 mg twice  daily, losartan 50 mg daily; spironolactone 25 mg daily -Continue.  Furosemide 40 mg daily -  strict ins and outs, weigh daily, sodium restricted diet  #) Paroxysmal atrial fibrillation/flutter with RVR: Patient cardioverted chemically with amiodarone infusion. -Continue beta-blocker -Continue apixaban 5 mg twice daily, held for cardiac catheterization  #) COPD with chronic tobacco abuse: -Continue ICS/LABA -continue PRN bronchodilators -Continue nicotine patch --incentive spirometry  #) Stage IIIb non-small cell lung cancer: Patient last received chemotherapy on 07/19/2018 with pembrolizumab, pemetrexed, carboplatin but apparently had a reaction to the chemotherapy -Oncology notified that patient is hospitalized  Fluids: Restricted Electrolytes: Monitor and supplement Nutrition: Heart healthy diet  Prophylaxis: On apixaban  Disposition: Pending cardiac catheterization  Full code  Consultants:   Cardiology  Procedures:  07/30/2018 echo: - Left ventricle: Diffuse hypokinesis worse in the inferior wall.   The cavity size was moderately dilated. Wall thickness was   normal. The estimated ejection fraction was 25%. The study is not   technically sufficient to allow evaluation of LV diastolic   function. - Aortic valve: Sclerosis without stenosis. - Mitral valve: Calcified annulus. Mildly thickened leaflets . - Left atrium: The atrium was moderately dilated.  - Atrial septum: No defect or patent foramen ovale was identified.  Antimicrobials:   None   Subjective: Patient reports he is doing fairly well today.  He reports his breathing is not a much improved but his lower extremity edema is almost completely resolved.  He reports some chest soreness but denies any nausea, vomiting, diarrhea, cough, congestion, rhinorrhea.  Objective: Vitals:   08/05/18 0552 08/05/18 0553 08/05/18 0749 08/05/18 0906  BP: 111/72   113/76  Pulse: (!) 56   61  Resp: 20     Temp: 98.4 F  (  36.9 C)     TempSrc: Oral     SpO2: 97%  96%   Weight:  84.6 kg    Height:        Intake/Output Summary (Last 24 hours) at 08/05/2018 1003 Last data filed at 08/05/2018 0600 Gross per 24 hour  Intake 588.44 ml  Output 525 ml  Net 63.44 ml   Filed Weights   08/03/18 0441 08/04/18 1000 08/05/18 0553  Weight: 85.9 kg 85.6 kg 84.6 kg    Examination:  General exam: Appears calm and comfortable; chronic ill appearance Respiratory system: No increased work of breathing, no wheezing, no crackles, + scattered rhonchi Cardiovascular system: Distant heart sounds, regular rate and rhythm, no murmurs Gastrointestinal system: Soft, nondistended, no rebound or guarding, plus bowel sounds. Central nervous system: Grossly intact, moving all extremities Extremities: 1+ lower extremity edema; compression socks in place Skin: Port site is clean dry and intact Psychiatry: Judgement and insight appear normal. Mood & affect appropriate.     Data Reviewed: I have personally reviewed following labs and imaging studies  CBC: Recent Labs  Lab 08/01/18 0518 08/02/18 0555 08/03/18 0440 08/04/18 0339 08/05/18 0401  WBC 11.3* 10.4 9.4 8.7 10.2  HGB 10.8* 10.6* 11.2* 10.9* 10.8*  HCT 35.2* 34.8* 35.9* 35.5* 35.0*  MCV 105.7* 107.7* 105.3* 104.7* 103.9*  PLT 146* 129* 124* 135* 010*   Basic Metabolic Panel: Recent Labs  Lab 08/01/18 0518 08/02/18 0555 08/03/18 0440 08/04/18 0339 08/05/18 0401  NA 139 137 136 137 137  K 3.7 3.5 4.7 4.1 4.2  CL 100 97* 96* 99 101  CO2 32 29 30 29 28   GLUCOSE 114* 158* 94 97 97  BUN 18 16 17 20 21   CREATININE 0.76 0.92 0.91 0.90 0.87  CALCIUM 9.0 8.7* 9.1 9.1 9.0  MG 2.1 2.0 2.0 2.0 2.2   GFR: Estimated Creatinine Clearance: 89 mL/min (by C-G formula based on SCr of 0.87 mg/dL). Liver Function Tests: Recent Labs  Lab 08/01/18 0518  AST 19  ALT 33  ALKPHOS 77  BILITOT 1.2  PROT 6.4*  ALBUMIN 3.5   No results for input(s): LIPASE, AMYLASE in  the last 168 hours. No results for input(s): AMMONIA in the last 168 hours. Coagulation Profile: No results for input(s): INR, PROTIME in the last 168 hours. Cardiac Enzymes: Recent Labs  Lab 07/30/18 0418 07/30/18 1018 07/30/18 1904  TROPONINI 0.07* 0.10* 0.09*   BNP (last 3 results) No results for input(s): PROBNP in the last 8760 hours. HbA1C: No results for input(s): HGBA1C in the last 72 hours. CBG: Recent Labs  Lab 07/30/18 0323  GLUCAP 129*   Lipid Profile: No results for input(s): CHOL, HDL, LDLCALC, TRIG, CHOLHDL, LDLDIRECT in the last 72 hours. Thyroid Function Tests: No results for input(s): TSH, T4TOTAL, FREET4, T3FREE, THYROIDAB in the last 72 hours. Anemia Panel: No results for input(s): VITAMINB12, FOLATE, FERRITIN, TIBC, IRON, RETICCTPCT in the last 72 hours. Sepsis Labs: No results for input(s): PROCALCITON, LATICACIDVEN in the last 168 hours.  Recent Results (from the past 240 hour(s))  MRSA PCR Screening     Status: None   Collection Time: 07/30/18  8:50 AM  Result Value Ref Range Status   MRSA by PCR NEGATIVE NEGATIVE Final    Comment:        The GeneXpert MRSA Assay (FDA approved for NASAL specimens only), is one component of a comprehensive MRSA colonization surveillance program. It is not intended to diagnose MRSA infection nor to guide or  monitor treatment for MRSA infections. Performed at [facility], [place] [address] [place], [place] [address]          Radiology Studies: No results found.      Scheduled Meds: . [START ON 08/07/2018] amiodarone  400 mg Oral Daily  . amiodarone  400 mg Oral BID  . carvedilol  3.125 mg Oral BID WC  . Chlorhexidine Gluconate Cloth  6 each Topical Daily  . furosemide  40 mg Oral Daily  . levalbuterol  0.63 mg Nebulization TID  . loratadine  10 mg Oral Daily  . losartan  50 mg Oral Daily  . mometasone-formoterol  2 puff Inhalation BID  . nicotine  14 mg Transdermal Daily  .  pantoprazole  40 mg Oral Daily  . potassium chloride  20 mEq Oral Daily  . sodium chloride flush  3 mL Intravenous Q12H  . spironolactone  25 mg Oral Daily   Continuous Infusions: . sodium chloride 10 mL/hr at 08/04/18 1337  . sodium chloride    . sodium chloride 40 mL/hr at 08/05/18 0552     LOS: 7 days    Time spent: 35 min    Cristy Folks, MD [facility]  If 7PM-7AM, please contact night-coverage [email or web address] Password [email or web address] 08/05/2018, 10:03 AM

## 2018-08-05 NOTE — Progress Notes (Signed)
ANTICOAGULATION CONSULT NOTE - Initial Consult  Pharmacy Consult for heparin Indication: chest pain/ACS  Allergies  Allergen Reactions  . Onion Anaphylaxis       . Cyramza [Ramucirumab] Itching    Itching, erythema, & tearing  . Taxotere [Docetaxel] Itching    Itching, erythema, & tearing    Patient Measurements: Height: 5\' 11"  (180.3 cm) Weight: 186 lb 8 oz (84.6 kg) IBW/kg (Calculated) : 75.3 Heparin Dosing Weight: 84kg  Vital Signs: Temp: 98.4 F (36.9 C) (01/20 0552) Temp Source: Oral (01/20 0552) BP: 116/33 (01/20 1615) Pulse Rate: 61 (01/20 1552)  Labs: Recent Labs    08/03/18 0440 08/04/18 0339 08/05/18 0401  HGB 11.2* 10.9* 10.8*  HCT 35.9* 35.5* 35.0*  PLT 124* 135* 131*  CREATININE 0.91 0.90 0.87    Estimated Creatinine Clearance: 89 mL/min (by C-G formula based on SCr of 0.87 mg/dL).   Medical History: Past Medical History:  Diagnosis Date  . Adenocarcinoma, lung ([place]) 10/08/2015   2.4cm spiculated LLL nodule suspicious for primary bronchogenic carcinoma by CT 09/2015 - adenocarcinoma by endobronchial biopsy  . Allergy   . Arthritis   . CAD (coronary artery disease)    mild MI; pt unaware - seen on stress test  . COPD (chronic obstructive pulmonary disease) ([place]) 06/26/2011   Mild centrilobular and paraseptal emphysema by CT 09/2015   . ED (erectile dysfunction)   . H/O anaphylactic shock 2013   hives,hypotension,syncope  . H/O seasonal allergies   . Herpes zoster 12/06/2015  . Hypertension   . Polycythemia 01/25/2014  . Prostate cancer [facility]) 2013   s/p radiation therapy March 2013, planned f/u with uro after XRT  . Radiation 09/18/2011 through 11/10/2011   Prostate 7600 cGy 40 sessions, seminal vesicles 5600 cGy 40 sessions    . Radiation 11/10/15-12/22/15   chest 60 Gy  . Shortness of breath dyspnea   . Smoker   . Thoracic aorta atherosclerosis ([place]) 10/08/2015   Mild by CT 09/2015      Assessment: 38 yoM on Eliquis  PTA for PAF admitted for [facility]. Pt to resume heparin 2 hr after TR band pulled and transfer back to [facility]. TR band off at 1615 per RN, will start heparin 1000 units/hr at 1830 and check 6hr heparin level and aPTT given somewhat recent DOAC exposure (last dose 1/18 pm).  Goal of Therapy:  Heparin level 0.3-0.7 units/ml  APTT 66-102 seconds Monitor platelets by anticoagulation protocol: Yes   Plan:  -Start heparin 1000 units/hr with no bolus at 1830 -Check 6hr heparin level and aPTT - discussed with [facility] as pt is transferring back -Follow-up transitioning back to [place], PharmD, BCPS Clinical Pharmacist [phone] Please check AMION for all [place] numbers 08/05/2018

## 2018-08-05 NOTE — Brief Op Note (Signed)
BRIEF CARDIAC CATHETERIZATION NOTE  08/05/2018  2:17 PM  PATIENT:  Brian White  68 y.o. male  PRE-OPERATIVE DIAGNOSIS:  Acute Systolic Heart Failure and VT Arrest  POST-OPERATIVE DIAGNOSIS:  Same  PROCEDURE:  Procedure(s): LEFT HEART CATH AND CORONARY ANGIOGRAPHY (N/A)  SURGEON:  Surgeon(s) and Role:    Nelva Bush, MD - Primary  FINDINGS: 1. Severe single-vessel coronary artery disease.  The LCx demonstrated moderate diffuse disease with severe diffuse disease involving OM3 (small caliber branch). 2. Mild, non-obstructive disease involving LCx and dominant RCA. 3. Mildly elevated LVEDP. 4. Inferolateral myocardial calcification consistent with prior infarct/scar.  RECOMMENDATIONS: 1. Medical therapy of mixed ischemic and non-ischemic cardiomyopathy.  Degree of cardiomyopathy is out of proportion to CAD. 2. Severely diseased OM3 is too small for percutaneous intervention. 3. Aggressive secondary prevention and evidence-based heart failure therapy. 4. Transfer back to [facility] after TR band removal. 5. Restart heparin infusion 2 hours after TR band removal.  Nelva Bush, MD [place] Pager: [phone]

## 2018-08-05 NOTE — Interval H&P Note (Signed)
History and Physical Interval Note:  08/05/2018 1:40 PM  Brian White  has presented today for cardiac catheterization, with the diagnosis of acute on chronic systolic heart failure and VT arrest. The various methods of treatment have been discussed with the patient and family. After consideration of risks, benefits and other options for treatment, the patient has consented to  Procedure(s): LEFT HEART CATH AND CORONARY ANGIOGRAPHY (N/A) as a surgical intervention .  The patient's history has been reviewed, patient examined, no change in status, stable for surgery.  I have reviewed the patient's chart and labs.  Questions were answered to the patient's satisfaction.    Cath Lab Visit (complete for each Cath Lab visit)  Clinical Evaluation Leading to the Procedure:   ACS: Yes.   (Acute heart failure and VT arrest)  Non-ACS:  N/A  [name]

## 2018-08-06 ENCOUNTER — Inpatient Hospital Stay (HOSPITAL_COMMUNITY): Payer: BLUE CROSS/BLUE SHIELD

## 2018-08-06 ENCOUNTER — Encounter (HOSPITAL_COMMUNITY): Payer: Self-pay | Admitting: Internal Medicine

## 2018-08-06 DIAGNOSIS — I5043 Acute on chronic combined systolic (congestive) and diastolic (congestive) heart failure: Secondary | ICD-10-CM

## 2018-08-06 LAB — BASIC METABOLIC PANEL
Anion gap: 9 (ref 5–15)
BUN: 21 mg/dL (ref 8–23)
CO2: 27 mmol/L (ref 22–32)
Calcium: 8.8 mg/dL — ABNORMAL LOW (ref 8.9–10.3)
Chloride: 101 mmol/L (ref 98–111)
Creatinine, Ser: 0.92 mg/dL (ref 0.61–1.24)
GFR calc Af Amer: >60 mL/min (ref >60)
GFR calc non Af Amer: >60 mL/min (ref >60)
Glucose, Bld: 102 mg/dL — ABNORMAL HIGH (ref 70–99)
Potassium: 3.8 mmol/L (ref 3.5–5.1)
Sodium: 137 mmol/L (ref 135–145)

## 2018-08-06 LAB — CBC
HCT: 33.1 % — ABNORMAL LOW (ref 39.0–52.0)
Hemoglobin: 10.4 g/dL — ABNORMAL LOW (ref 13.0–17.0)
MCH: 33.1 pg (ref 26.0–34.0)
MCHC: 31.4 g/dL (ref 30.0–36.0)
MCV: 105.4 fL — ABNORMAL HIGH (ref 80.0–100.0)
Platelets: 112 10*3/uL — ABNORMAL LOW (ref 150–400)
RBC: 3.14 MIL/uL — ABNORMAL LOW (ref 4.22–5.81)
RDW: 16 % — ABNORMAL HIGH (ref 11.5–15.5)
WBC: 9 10*3/uL (ref 4.0–10.5)
nRBC: 0 % (ref 0.0–0.2)

## 2018-08-06 LAB — HEPARIN LEVEL (UNFRACTIONATED): Heparin Unfractionated: 1.22 IU/mL — ABNORMAL HIGH (ref 0.30–0.70)

## 2018-08-06 LAB — APTT: aPTT: 78 seconds — ABNORMAL HIGH (ref 24–36)

## 2018-08-06 MED ORDER — METOPROLOL TARTRATE 25 MG PO TABS
12.5000 mg | ORAL_TABLET | Freq: Four times a day (QID) | ORAL | Status: DC
  Administered 2018-08-06 – 2018-08-08 (×7): 12.5 mg via ORAL
  Filled 2018-08-06 (×9): qty 1

## 2018-08-06 MED ORDER — LOSARTAN POTASSIUM 25 MG PO TABS
25.0000 mg | ORAL_TABLET | Freq: Every day | ORAL | Status: DC
  Administered 2018-08-06: 25 mg via ORAL
  Filled 2018-08-06: qty 1

## 2018-08-06 MED ORDER — FUROSEMIDE 40 MG PO TABS
40.0000 mg | ORAL_TABLET | Freq: Two times a day (BID) | ORAL | Status: DC
  Administered 2018-08-06 – 2018-08-08 (×4): 40 mg via ORAL
  Filled 2018-08-06 (×5): qty 1

## 2018-08-06 MED ORDER — APIXABAN 5 MG PO TABS
5.0000 mg | ORAL_TABLET | Freq: Two times a day (BID) | ORAL | Status: DC
  Administered 2018-08-06 – 2018-08-08 (×5): 5 mg via ORAL
  Filled 2018-08-06 (×2): qty 1
  Filled 2018-08-06: qty 2
  Filled 2018-08-06 (×2): qty 1

## 2018-08-06 MED ORDER — LEVALBUTEROL HCL 0.63 MG/3ML IN NEBU
0.6300 mg | INHALATION_SOLUTION | Freq: Two times a day (BID) | RESPIRATORY_TRACT | Status: DC
  Administered 2018-08-06 – 2018-08-08 (×4): 0.63 mg via RESPIRATORY_TRACT
  Filled 2018-08-06 (×4): qty 3

## 2018-08-06 MED ORDER — SPIRONOLACTONE 12.5 MG HALF TABLET
12.5000 mg | ORAL_TABLET | Freq: Every day | ORAL | Status: DC
  Administered 2018-08-07 – 2018-08-08 (×2): 12.5 mg via ORAL
  Filled 2018-08-06 (×2): qty 1

## 2018-08-06 NOTE — Progress Notes (Addendum)
Progress Note  Patient Name: Brian White Date of Encounter: 08/06/2018  Primary Cardiologist: Kirk Ruths, MD   Subjective   Patient denies any chest pain or dyspnea.  He has only been up walking in the room.  He still has lower leg edema to just below mid calf.   Inpatient Medications    Scheduled Meds: . [START ON 08/07/2018] amiodarone  400 mg Oral Daily  . amiodarone  400 mg Oral BID  . carvedilol  3.125 mg Oral BID WC  . Chlorhexidine Gluconate Cloth  6 each Topical Daily  . furosemide  40 mg Oral Daily  . levalbuterol  0.63 mg Nebulization BID  . loratadine  10 mg Oral Daily  . losartan  50 mg Oral Daily  . mometasone-formoterol  2 puff Inhalation BID  . nicotine  14 mg Transdermal Daily  . pantoprazole  40 mg Oral Daily  . potassium chloride  20 mEq Oral Daily  . sodium chloride flush  3 mL Intravenous Q12H  . spironolactone  25 mg Oral Daily   Continuous Infusions: . sodium chloride 10 mL/hr at 08/04/18 1337  . sodium chloride    . heparin 1,000 Units/hr (08/05/18 1846)   PRN Meds: sodium chloride, acetaminophen, guaiFENesin, ipratropium-albuterol, magnesium hydroxide, mirtazapine, morphine injection, ondansetron (ZOFRAN) IV, oxyCODONE, sodium chloride flush, sodium chloride flush   Vital Signs    Vitals:   08/06/18 0800 08/06/18 0805 08/06/18 0820 08/06/18 0827  BP: (!) 88/66 100/60    Pulse: (!) 57  (!) 57 (!) 57  Resp:   18 18  Temp:   97.6 F (36.4 C)   TempSrc:   Oral   SpO2:   98% 98%  Weight:      Height:        Intake/Output Summary (Last 24 hours) at 08/06/2018 0847 Last data filed at 08/06/2018 0600 Gross per 24 hour  Intake 692.72 ml  Output 440 ml  Net 252.72 ml   Last 3 Weights 08/06/2018 08/05/2018 08/04/2018  Weight (lbs) 186 lb 6.4 oz 186 lb 8 oz 188 lb 11.4 oz  Weight (kg) 84.55 kg 84.596 kg 85.6 kg      Telemetry    Sinus rhythm with PVCs with frequent atrial flutter in the 90s-120- Personally Reviewed  ECG    EKG  ordered to confirm atrial flutter  Physical Exam   GEN:  Chronically ill-appearing male, no acute distress.   Neck: No JVD Cardiac: RRR, no murmurs, rubs, or gallops.  Respiratory: Clear to auscultation bilaterally. GI: Soft, nontender, non-distended  MS: 1+ lower leg edema; No deformity. Neuro:  Nonfocal  Psych: Normal affect   Labs    Chemistry Recent Labs  Lab 08/01/18 0518  08/04/18 0339 08/05/18 0401 08/06/18 0452  NA 139   < > 137 137 137  K 3.7   < > 4.1 4.2 3.8  CL 100   < > 99 101 101  CO2 32   < > 29 28 27   GLUCOSE 114*   < > 97 97 102*  BUN 18   < > 20 21 21   CREATININE 0.76   < > 0.90 0.87 0.92  CALCIUM 9.0   < > 9.1 9.0 8.8*  PROT 6.4*  --   --   --   --   ALBUMIN 3.5  --   --   --   --   AST 19  --   --   --   --   ALT 33  --   --   --   --  ALKPHOS 77  --   --   --   --   BILITOT 1.2  --   --   --   --   GFRNONAA >60   < > >60 >60 >60  GFRAA >60   < > >60 >60 >60  ANIONGAP 7   < > 9 8 9    < > = values in this interval not displayed.     Hematology Recent Labs  Lab 08/04/18 0339 08/05/18 0401 08/06/18 0452  WBC 8.7 10.2 9.0  RBC 3.39* 3.37* 3.14*  HGB 10.9* 10.8* 10.4*  HCT 35.5* 35.0* 33.1*  MCV 104.7* 103.9* 105.4*  MCH 32.2 32.0 33.1  MCHC 30.7 30.9 31.4  RDW 16.3* 16.0* 16.0*  PLT 135* 131* 112*    Cardiac Enzymes Recent Labs  Lab 07/30/18 1018 07/30/18 1904  TROPONINI 0.10* 0.09*   No results for input(s): TROPIPOC in the last 168 hours.   BNPNo results for input(s): BNP, PROBNP in the last 168 hours.   DDimer No results for input(s): DDIMER in the last 168 hours.   Radiology    No results found.  Cardiac Studies   LEFT HEART CATH AND CORONARY ANGIOGRAPHY 08/05/18  FINDINGS: 1. Severe single-vessel coronary artery disease.  The LCx demonstrated moderate diffuse disease with severe diffuse disease involving OM3 (small caliber branch). 2. Mild, non-obstructive disease involving LCx and dominant RCA. 3. Mildly elevated  LVEDP. 4. Inferolateral myocardial calcification consistent with prior infarct/scar.  RECOMMENDATIONS: 1. Medical therapy of mixed ischemic and non-ischemic cardiomyopathy.  Degree of cardiomyopathy is out of proportion to CAD. 2. Severely diseased OM3 is too small for percutaneous intervention. 3. Aggressive secondary prevention and evidence-based heart failure therapy. 4. Transfer back to [facility] after TR band removal. 5. Restart heparin infusion 2 hours after TR band removal. Nelva Bush, MD  Diagnostic  Dominance: Right      Echocardiogram 07/30/2018 Study Conclusions - Left ventricle: Diffuse hypokinesis worse in the inferior wall.   The cavity size was moderately dilated. Wall thickness was   normal. The estimated ejection fraction was 25%. The study is not   technically sufficient to allow evaluation of LV diastolic   function. - Aortic valve: Sclerosis without stenosis. - Mitral valve: Calcified annulus. Mildly thickened leaflets . - Left atrium: The atrium was moderately dilated. - Atrial septum: No defect or patent foramen ovale was identified.   Patient Profile     68 y.o. male with a history of severely depressed left ventricular systolic function, persistent atrial flutter, COPD, lung cancer and he was admitted with respiratory failure and ultimately had a cardiac arrest with ventricular tachycardia.  Assessment & Plan    1.  Cardiac arrest -Patient presented on 07/29/2018 status post cardiac arrest with ventricular tachycardia during arrest.  He was defibrillated and received CPR. -Patient was taken for cardiac catheterization yesterday that revealed Severe single-vessel coronary artery disease with LCx demonstrating moderate diffuse disease with severe diffuse disease involving OM3 (too small for PCI), Mildly elevated LVEDP and Inferolateral myocardial calcification consistent with prior infarct/scar. -Medical therapy is recommended for treatment of mixed  ischemic and nonischemic cardiomyopathy.  Degree of cardiomyopathy seems out of proportion to CAD. -Patient has been started on low-dose carvedilol 3.125 milligrams twice daily, losartan 50 mg daily, Lasix 40 mg daily and spironolactone 25 mg daily -Pt became hypotensive overnight. May be related to In and out of atrial flutter in the 90's-120. Will hold spironolactone today. Decrease losartan back to his home dose.  Continue low dose carvedilol.   2. Persistent atrial flutter  -Pt in and out (mostly in) atrial flutter over night. Rates 90's-120.  -Pt is on amiodarone 400 mg now BID for VT that led to cardiac arrest. Continue on low dose BB. -Can switch heparin back to home Eliquis -He was evaluated in the office by EP, Dr. [name], in November and planned for aflutter ablation, but it was then canceled.   3. Ventricular tachycardia -Patient continues on amiodarone 400 mg now twice daily -Patient had moderate to frequent PVCs when in sinus rhythm, now in atrial flutter.  No VT noted on telemetry  4.  Combined systolic and diastolic heart failure -Echocardiogram done on 07/30/2018 showed EF 25%, consistent with prior. -Cardiac cath done yesterday showed only single vessel CAD with severe diffuse disease of the OM 3, too small for PCI, large vessels are patent.  Cardiomyopathy seems out of proportion for his CAD. -May be related to 3 years of chemotherapy or tachycardia mediated with aflutter.  -The patient continues on medical therapy with carvedilol, losartan, spironolactone and Lasix.  Losartan and spironolactone doses were increased and patient became hypotensive overnight.  Dose is reduced back to his home dose. -Patient denies orthopnea or significant dyspnea on exertion.  Still has some lower leg edema. -Patient may benefit from increasing Lasix to twice daily for today to remove a little more fluid.  Probably ready for discharge tomorrow. -We discussed low-sodium diet, elevating legs, use  of compression stockings which she is wearing.  Patient may have to live with some degree of lower leg edema.  I reviewed with the patient and his wife watching for orthopnea or increased DOE at home that would signal worsening heart failure.  For questions or updates, please contact [place] Please consult [email or web address] for contact info under   Signed, Daune Perch, NP  08/06/2018, 8:47 AM    Attending Note:   The patient was seen and examined.  Agree with assessment and plan as noted above.  Changes made to the above note as needed.  Patient seen and independently examined with  Pecolia Ades, NP .   We discussed all aspects of the encounter. I agree with the assessment and plan as stated above.  1.  Status post cardiac arrest: Patient had heart catheterization which revealed disease of the left circumflex artery.  The distal branches of the left circumflex were fairly small and diffusely diseased. The degree of LV dysfunction is out of proportion to the degree of coronary artery disease. Continue amiodarone.  2.  Paroxysmal atrial fibrillation: The patient is back in atrial fibrillation.  His blood pressure is marginally low .  He admits that he is not eaten anything today which partially explains his low blood pressure. Continue amiodarone for now.  I encouraged him to eat better. See if he converts back to sinus rhythm.  If he does not we will consider cardioversion Thursday or Friday.  2.  Acute on chronic systolic congestive heart failure: We tried to gradually uptitrate his CHF medications but he did not tolerate it due to hypotension. Hold losartan for now.  Continue metoprolol.    I have spent a total of 40 minutes with patient reviewing hospital  notes , telemetry, EKGs, labs and examining patient as well as establishing an assessment and plan that was discussed with the patient. > 50% of time was spent in direct patient care.    Thayer Headings, Brooke Bonito., MD,  [facility] 08/06/2018, 1:36 PM [address],  [address] Pager [phone]

## 2018-08-06 NOTE — Progress Notes (Signed)
BP taken with dinamap 88/66. Manual BP 100/60. HR 57. Pt is asymptomatic. MD aware. Morning dose of Coreg held. Brian White, Bing Neighbors, RN

## 2018-08-06 NOTE — Progress Notes (Signed)
HR continues to sustain in 120's. Pt remains asymptomatic, other vital signs are stable. MD made aware. No new orders at this time. Clarice Bonaventure, Bing Neighbors, RN

## 2018-08-06 NOTE — Progress Notes (Addendum)
Pt's HR elevated in 120's and sustaining. HR briefly went up to 140's and came back down to 120's. VSS, pt is aymptomatic. MD made aware. CXR and lopressor ordered. Stephanee Barcomb, Bing Neighbors, RN

## 2018-08-06 NOTE — Progress Notes (Signed)
Dr. Brigid Re called and notified about pt's manual BP of 90/65 and HR 100-120's.  Lasix held per v/o and Metoprolol moved to 7:00pm to allow more time before next dose. Will recheck BP and continue to monitor

## 2018-08-06 NOTE — Progress Notes (Signed)
MD made aware of elevated MEWS score   Vital Signs MEWS/VS Documentation      08/06/2018 0820 08/06/2018 0827 08/06/2018 1024 08/06/2018 1214   MEWS Score:  1  1  0  2   MEWS Score Color:  Green  Green  Green  Yellow   Resp:  18  18  -  18   Pulse:  (!) 57  (!) 57  94  (!) 122   BP:  -  -  (!) 104/52  101/70   Temp:  97.6 F (36.4 C)  -  -  98 F (36.7 C)   O2 Device:  Room Health visitor  -  Room Air   Level of Consciousness:  Alert  Alert  -  Alert           Naaman Curro K 08/06/2018,12:45 PM

## 2018-08-06 NOTE — Progress Notes (Addendum)
Pt converted to A flutter. Sustaining in the low 100's. EKG completed and placed on chart. Emilo Gras, Bing Neighbors, RN

## 2018-08-06 NOTE — Progress Notes (Signed)
Benefit checked on Eliquis-$[phone].

## 2018-08-06 NOTE — Progress Notes (Signed)
ANTICOAGULATION CONSULT NOTE -  Consult  Pharmacy Consult for heparin Indication: chest pain/ACS  Allergies  Allergen Reactions  . Onion Anaphylaxis       . Cyramza [Ramucirumab] Itching    Itching, erythema, & tearing  . Taxotere [Docetaxel] Itching    Itching, erythema, & tearing    Patient Measurements: Height: 5\' 11"  (180.3 cm) Weight: 186 lb 8 oz (84.6 kg) IBW/kg (Calculated) : 75.3 Heparin Dosing Weight: 84kg  Vital Signs: Temp: 97.9 F (36.6 C) (01/20 2103) Temp Source: Oral (01/20 2103) BP: 116/51 (01/20 2103) Pulse Rate: 66 (01/20 2103)  Labs: Recent Labs    08/04/18 0339 08/05/18 0401 08/06/18 0452  HGB 10.9* 10.8* 10.4*  HCT 35.5* 35.0* 33.1*  PLT 135* 131* 112*  APTT  --   --  78*  CREATININE 0.90 0.87 0.92    Estimated Creatinine Clearance: 84.1 mL/min (by C-G formula based on SCr of 0.92 mg/dL).   Medical History: Past Medical History:  Diagnosis Date  . Adenocarcinoma, lung ([place]) 10/08/2015   2.4cm spiculated LLL nodule suspicious for primary bronchogenic carcinoma by CT 09/2015 - adenocarcinoma by endobronchial biopsy  . Allergy   . Arthritis   . CAD (coronary artery disease)    mild MI; pt unaware - seen on stress test  . COPD (chronic obstructive pulmonary disease) ([place]) 06/26/2011   Mild centrilobular and paraseptal emphysema by CT 09/2015   . ED (erectile dysfunction)   . H/O anaphylactic shock 2013   hives,hypotension,syncope  . H/O seasonal allergies   . Herpes zoster 12/06/2015  . Hypertension   . Polycythemia 01/25/2014  . Prostate cancer [facility]) 2013   s/p radiation therapy March 2013, planned f/u with uro after XRT  . Radiation 09/18/2011 through 11/10/2011   Prostate 7600 cGy 40 sessions, seminal vesicles 5600 cGy 40 sessions    . Radiation 11/10/15-12/22/15   chest 60 Gy  . Shortness of breath dyspnea   . Smoker   . Thoracic aorta atherosclerosis ([place]) 10/08/2015   Mild by CT 09/2015      Assessment: 37  yoM on Eliquis PTA for PAF admitted for [facility]. Pt to resume heparin 2 hr after TR band pulled and transfer back to [facility]. TR band off at 1615 per RN, will start heparin 1000 units/hr at 1830 and check 6hr heparin level and aPTT given somewhat recent DOAC exposure (last dose 1/18 pm).  Today, 1/21 0531 aptt = 78 sec at goal and HL = 1.22 (recent DOAC?) H/H = 10.4/33.1, plts = 112 (down from 131)  Goal of Therapy:  Heparin level 0.3-0.7 units/ml  APTT 66-102 seconds Monitor platelets by anticoagulation protocol: Yes   Plan:   -Continue heparin drip at 1000 units/hr -Recheck aPtt and HL later today -Follow-up transitioning back to [facility], [place] Pharmacist

## 2018-08-06 NOTE — Progress Notes (Signed)
PROGRESS NOTE    Stephen Baruch  OZH:[id number] DOB: 1950/10/13 DOA: 07/28/2018 PCP: [name], MD   Brief Narrative:  68 year old with past medical history relevant for stage IIIb lung cancer, chronic systolic heart failure with EF of 25% (echo on 07/30/2018, wall motion abnormality on inferior wall), paroxysmal atrial fibrillation/flutter on apixaban, hypertension, COPD with chronic tobacco abuse who presented with atrial fibrillation with rapid ventricular response and subsequently developed ventricular tachycardia with arrest status post defibrillation and initiation of amiodarone. Echo on 07/31/2016 shows wall motion abnormalities. Cardiology suspected VT arrest originated from CHF exacerbation +/- ischemia.  Cardiac catheterization on 08/05/2018 shows only diffuse disease of OM 3 with likely mixed ischemic nonischemic cardiomyopathy.   Assessment & Plan:   Principal Problem:   Atrial fibrillation with RVR (HCC) Active Problems:   HTN (hypertension)   COPD (chronic obstructive pulmonary disease) (HCC)   Smoker   Cancer of lower lobe of left lung (HCC)   Anticoagulated   PAF (paroxysmal atrial fibrillation) (HCC)   Acute systolic heart failure (HCC)   Ventricular tachycardia ([place])   Primary malignant neoplasm of lung metastatic to other site [facility])  #) Acute on chronic systolic heart failure exacerbation due to mixed ischemic nonischemic cardiomyopathy: This is now predominantly resolved with patient unfortunately is hypotensive today in the setting of increasing his ARB and spironolactone. - Continue carvedilol 3.125 mg twice daily -Decrease losartan to 25 mg daily and spironolactone to 12.5 mg daily -Increase furosemide 40 mg twice daily -  strict ins and outs, weigh daily, sodium restricted diet  #) Coronary artery disease: Patient noted to have severe OM 3 disease with area of scar. -On heparin drip, will transition back to apixaban  #) VT arrest: This was in the setting of  being on a diltiazem drip for atrial fibrillation with rapid response. Per cardiology it is felt that the primary exacerbating factor was a heart failure exacerbation. - Echo performed on 07/31/2016 shows wall motion abnormalities -Cardiac catheterization on 08/05/2018 showed OM 3 disease with scar and mixed ischemic nonischemic cardiomyopathy -Cardiology consulted, appreciate recommendations -Diuresis per below -continue  amiodarone to 400 mg by mouth daily -Keep potassium greater than 4 and magnesium greater than 2, telemetry monitoring. Pt does have frequent PVCs  #) Paroxysmal atrial fibrillation/flutter with RVR: Unfortunately patient has converted back into atrial fibrillation with intermittent runs of RVR and flutter. -Continue beta-blocker -Restart apixaban 5 mg twice daily -Cardiology will discuss about possible need for cardioversion  #) COPD with chronic tobacco abuse: -Continue ICS/LABA -continue PRN bronchodilators -Continue nicotine patch --incentive spirometry  #) Stage IIIb non-small cell lung cancer: Patient last received chemotherapy on 07/19/2018 with pembrolizumab, pemetrexed, carboplatin but apparently had a reaction to the chemotherapy -Oncology notified that patient is hospitalized  Fluids: Restricted Electrolytes: Monitor and supplement Nutrition: Heart healthy diet  Prophylaxis: On apixaban  Disposition: Pending stable blood pressures and heart rate  Full code  Consultants:   Cardiology  Procedures:  07/30/2018 echo: - Left ventricle: Diffuse hypokinesis worse in the inferior wall.   The cavity size was moderately dilated. Wall thickness was   normal. The estimated ejection fraction was 25%. The study is not   technically sufficient to allow evaluation of LV diastolic   function. - Aortic valve: Sclerosis without stenosis. - Mitral valve: Calcified annulus. Mildly thickened leaflets . - Left atrium: The atrium was moderately dilated.  - Atrial septum:  No defect or patent foramen ovale was identified.  08/05/2018 cardiac catheterization:FINDINGS: 1. Severe single-vessel  coronary artery disease.  The LCx demonstrated moderate diffuse disease with severe diffuse disease involving OM3 (small caliber branch). 2. Mild, non-obstructive disease involving LCx and dominant RCA. 3. Mildly elevated LVEDP. 4. Inferolateral myocardial calcification consistent with prior infarct/scar.  RECOMMENDATIONS: 1. Medical therapy of mixed ischemic and non-ischemic cardiomyopathy.  Degree of cardiomyopathy is out of proportion to CAD. 2. Severely diseased OM3 is too small for percutaneous intervention. 3. Aggressive secondary prevention and evidence-based heart failure therapy. 4. Transfer back to [facility] after TR band removal. 5. Restart heparin infusion 2 hours after TR band removal.   Antimicrobials:   None   Subjective: Patient reports he is doing about the same in terms of his breathing.  He denies any nausea, vomiting, diarrhea, chest pain though he does have chest wall soreness.  He was hypotensive but relatively asymptomatic.  Objective: Vitals:   08/06/18 0800 08/06/18 0805 08/06/18 0820 08/06/18 0827  BP: (!) 88/66 100/60    Pulse: (!) 57  (!) 57 (!) 57  Resp:   18 18  Temp:   97.6 F (36.4 C)   TempSrc:   Oral   SpO2:   98% 98%  Weight:      Height:        Intake/Output Summary (Last 24 hours) at 08/06/2018 0947 Last data filed at 08/06/2018 0600 Gross per 24 hour  Intake 692.72 ml  Output 440 ml  Net 252.72 ml   Filed Weights   08/04/18 1000 08/05/18 0553 08/06/18 0604  Weight: 85.6 kg 84.6 kg 84.6 kg    Examination:  General exam: Appears calm and comfortable;  Respiratory system: No increased work of breathing, no wheezing, no crackles, + scattered rhonchi Cardiovascular system: Distant heart sounds, regular rate and rhythm, no murmurs Gastrointestinal system: Soft, nondistended, no rebound or guarding, plus bowel  sounds. Central nervous system: Grossly intact, moving all extremities Extremities: 1+ lower extremity edema; compression socks in place Skin: Port site is clean dry and intact Psychiatry: Judgement and insight appear normal. Mood & affect appropriate.     Data Reviewed: I have personally reviewed following labs and imaging studies  CBC: Recent Labs  Lab 08/02/18 0555 08/03/18 0440 08/04/18 0339 08/05/18 0401 08/06/18 0452  WBC 10.4 9.4 8.7 10.2 9.0  HGB 10.6* 11.2* 10.9* 10.8* 10.4*  HCT 34.8* 35.9* 35.5* 35.0* 33.1*  MCV 107.7* 105.3* 104.7* 103.9* 105.4*  PLT 129* 124* 135* 131* 035*   Basic Metabolic Panel: Recent Labs  Lab 08/01/18 0518 08/02/18 0555 08/03/18 0440 08/04/18 0339 08/05/18 0401 08/06/18 0452  NA 139 137 136 137 137 137  K 3.7 3.5 4.7 4.1 4.2 3.8  CL 100 97* 96* 99 101 101  CO2 32 29 30 29 28 27   GLUCOSE 114* 158* 94 97 97 102*  BUN 18 16 17 20 21 21   CREATININE 0.76 0.92 0.91 0.90 0.87 0.92  CALCIUM 9.0 8.7* 9.1 9.1 9.0 8.8*  MG 2.1 2.0 2.0 2.0 2.2  --    GFR: Estimated Creatinine Clearance: 84.1 mL/min (by C-G formula based on SCr of 0.92 mg/dL). Liver Function Tests: Recent Labs  Lab 08/01/18 0518  AST 19  ALT 33  ALKPHOS 77  BILITOT 1.2  PROT 6.4*  ALBUMIN 3.5   No results for input(s): LIPASE, AMYLASE in the last 168 hours. No results for input(s): AMMONIA in the last 168 hours. Coagulation Profile: No results for input(s): INR, PROTIME in the last 168 hours. Cardiac Enzymes: Recent Labs  Lab 07/30/18 1018 07/30/18 1904  TROPONINI 0.10* 0.09*   BNP (last 3 results) No results for input(s): PROBNP in the last 8760 hours. HbA1C: No results for input(s): HGBA1C in the last 72 hours. CBG: No results for input(s): GLUCAP in the last 168 hours. Lipid Profile: No results for input(s): CHOL, HDL, LDLCALC, TRIG, CHOLHDL, LDLDIRECT in the last 72 hours. Thyroid Function Tests: No results for input(s): TSH, T4TOTAL, FREET4,  T3FREE, THYROIDAB in the last 72 hours. Anemia Panel: No results for input(s): VITAMINB12, FOLATE, FERRITIN, TIBC, IRON, RETICCTPCT in the last 72 hours. Sepsis Labs: No results for input(s): PROCALCITON, LATICACIDVEN in the last 168 hours.  Recent Results (from the past 240 hour(s))  MRSA PCR Screening     Status: None   Collection Time: 07/30/18  8:50 AM  Result Value Ref Range Status   MRSA by PCR NEGATIVE NEGATIVE Final    Comment:        The GeneXpert MRSA Assay (FDA approved for NASAL specimens only), is one component of a comprehensive MRSA colonization surveillance program. It is not intended to diagnose MRSA infection nor to guide or monitor treatment for MRSA infections. Performed at [facility], [place] [address] [place], [place] [address]          Radiology Studies: No results found.      Scheduled Meds: . [START ON 08/07/2018] amiodarone  400 mg Oral Daily  . amiodarone  400 mg Oral BID  . carvedilol  3.125 mg Oral BID WC  . Chlorhexidine Gluconate Cloth  6 each Topical Daily  . furosemide  40 mg Oral Daily  . levalbuterol  0.63 mg Nebulization BID  . loratadine  10 mg Oral Daily  . losartan  25 mg Oral Daily  . mometasone-formoterol  2 puff Inhalation BID  . nicotine  14 mg Transdermal Daily  . pantoprazole  40 mg Oral Daily  . potassium chloride  20 mEq Oral Daily  . sodium chloride flush  3 mL Intravenous Q12H  . [START ON 08/07/2018] spironolactone  12.5 mg Oral Daily   Continuous Infusions: . sodium chloride 10 mL/hr at 08/04/18 1337  . sodium chloride    . heparin 1,000 Units/hr (08/05/18 1846)     LOS: 8 days    Time spent: 37 min    Cristy Folks, MD [facility]  If 7PM-7AM, please contact night-coverage [email or web address] Password [email or web address] 08/06/2018, 9:47 AM

## 2018-08-07 ENCOUNTER — Inpatient Hospital Stay: Payer: BLUE CROSS/BLUE SHIELD

## 2018-08-07 ENCOUNTER — Other Ambulatory Visit: Payer: BLUE CROSS/BLUE SHIELD

## 2018-08-07 ENCOUNTER — Ambulatory Visit: Payer: BLUE CROSS/BLUE SHIELD | Admitting: Internal Medicine

## 2018-08-07 ENCOUNTER — Ambulatory Visit: Payer: BLUE CROSS/BLUE SHIELD

## 2018-08-07 LAB — BASIC METABOLIC PANEL
Anion gap: 8 (ref 5–15)
BUN: 25 mg/dL — ABNORMAL HIGH (ref 8–23)
CO2: 27 mmol/L (ref 22–32)
Calcium: 8.9 mg/dL (ref 8.9–10.3)
Chloride: 102 mmol/L (ref 98–111)
Creatinine, Ser: 0.98 mg/dL (ref 0.61–1.24)
GFR calc Af Amer: >60 mL/min (ref >60)
GFR calc non Af Amer: >60 mL/min (ref >60)
Glucose, Bld: 110 mg/dL — ABNORMAL HIGH (ref 70–99)
Potassium: 4.2 mmol/L (ref 3.5–5.1)
Sodium: 137 mmol/L (ref 135–145)

## 2018-08-07 LAB — CBC
HCT: 33.2 % — ABNORMAL LOW (ref 39.0–52.0)
Hemoglobin: 10.3 g/dL — ABNORMAL LOW (ref 13.0–17.0)
MCH: 32.3 pg (ref 26.0–34.0)
MCHC: 31 g/dL (ref 30.0–36.0)
MCV: 104.1 fL — ABNORMAL HIGH (ref 80.0–100.0)
Platelets: 144 10*3/uL — ABNORMAL LOW (ref 150–400)
RBC: 3.19 MIL/uL — ABNORMAL LOW (ref 4.22–5.81)
RDW: 15.9 % — ABNORMAL HIGH (ref 11.5–15.5)
WBC: 9.4 10*3/uL (ref 4.0–10.5)
nRBC: 0 % (ref 0.0–0.2)

## 2018-08-07 MED ORDER — AMIODARONE HCL 200 MG PO TABS
200.0000 mg | ORAL_TABLET | Freq: Two times a day (BID) | ORAL | Status: DC
  Administered 2018-08-07 – 2018-08-08 (×2): 200 mg via ORAL
  Filled 2018-08-07 (×2): qty 1

## 2018-08-07 NOTE — Progress Notes (Signed)
PT demonstrated hands on understanding of Flutter device- NPC at this time.

## 2018-08-07 NOTE — Progress Notes (Signed)
PROGRESS NOTE    Brian White  AJO:[id number] DOB: 06/15/1951 DOA: 07/28/2018 PCP: [name], MD   Brief Narrative:  68 year old with past medical history relevant for stage IIIb lung cancer, chronic systolic heart failure with EF of 25% (echo on 07/30/2018, wall motion abnormality on inferior wall), paroxysmal atrial fibrillation/flutter on apixaban, hypertension, COPD with chronic tobacco abuse who presented with atrial fibrillation with rapid ventricular response and subsequently developed ventricular tachycardia with arrest status post defibrillation and initiation of amiodarone. Echo on 07/31/2016 shows wall motion abnormalities. Cardiology suspected VT arrest originated from CHF exacerbation +/- ischemia.  Cardiac catheterization on 08/05/2018 shows only diffuse disease of OM 3 with likely mixed ischemic nonischemic cardiomyopathy.   Assessment & Plan:   Principal Problem:   Atrial fibrillation with RVR (HCC) Active Problems:   HTN (hypertension)   COPD (chronic obstructive pulmonary disease) (HCC)   Smoker   Cancer of lower lobe of left lung (HCC)   Anticoagulated   PAF (paroxysmal atrial fibrillation) (HCC)   Acute systolic heart failure (HCC)   Ventricular tachycardia (HCC)   Primary malignant neoplasm of lung metastatic to other site [facility])   Acute on chronic combined systolic and diastolic CHF (congestive heart failure) ([place])  #) Paroxysmal atrial fibrillation/flutter with RVR: Patient unfortunately converted back into atrial fibrillation with RVR.  He was relatively asymptomatic.  His beta-blocker was changed from carvedilol to metoprolol tartrate every 6 hours.  Unfortunately titrating up the dose is limited by the fact that when he does spontaneously convert he is bradycardic.  Additionally his blood pressures have been relatively borderline. -Continue metoprolol tartrate 12.5 mg every 6 hours -Continue apixaban 5 mg twice daily -Cardiology will consider cardioversion if  atrial fibrillation is persistent.   #) Acute on chronic systolic heart failure exacerbation due to mixed ischemic nonischemic cardiomyopathy: This is been resolved with diuresis. - Continue carvedilol 3.125 mg twice daily -Continue losartan to 25 mg daily and spironolactone to 12.5 mg daily -Continue furosemide 40 mg p.o. twice daily -  strict ins and outs, weigh daily, sodium restricted diet  #) Coronary artery disease: Patient noted to have severe OM 3 disease with area of scar. -Continue statin, beta-blocker, novel oral anticoagulant  #) VT arrest: This was in the setting of being on a diltiazem drip for atrial fibrillation with rapid response. Per cardiology it is felt that the primary exacerbating factor was a heart failure exacerbation. - Echo performed on 07/31/2016 shows wall motion abnormalities -Cardiac catheterization on 08/05/2018 showed OM 3 disease with scar and mixed ischemic nonischemic cardiomyopathy -Cardiology consulted, appreciate recommendations -continue  amiodarone to 400 mg by mouth daily -Keep potassium greater than 4 and magnesium greater than 2, telemetry monitoring  #) COPD with chronic tobacco abuse: -Continue ICS/LABA -continue PRN bronchodilators -Continue nicotine patch  #) Stage IIIb non-small cell lung cancer: Patient last received chemotherapy on 07/19/2018 with pembrolizumab, pemetrexed, carboplatin but apparently had a reaction to the chemotherapy -Oncology notified that patient is hospitalized  Fluids: Restricted Electrolytes: Monitor and supplement Nutrition: Heart healthy diet  Prophylaxis: On apixaban  Disposition: Pending stable blood pressures and heart rate  Full code  Consultants:   Cardiology  Procedures:  07/30/2018 echo: - Left ventricle: Diffuse hypokinesis worse in the inferior wall.   The cavity size was moderately dilated. Wall thickness was   normal. The estimated ejection fraction was 25%. The study is not   technically  sufficient to allow evaluation of LV diastolic   function. - Aortic valve: Sclerosis  without stenosis. - Mitral valve: Calcified annulus. Mildly thickened leaflets . - Left atrium: The atrium was moderately dilated.  - Atrial septum: No defect or patent foramen ovale was identified.  08/05/2018 cardiac catheterization:FINDINGS: 1. Severe single-vessel coronary artery disease.  The LCx demonstrated moderate diffuse disease with severe diffuse disease involving OM3 (small caliber branch). 2. Mild, non-obstructive disease involving LCx and dominant RCA. 3. Mildly elevated LVEDP. 4. Inferolateral myocardial calcification consistent with prior infarct/scar.  RECOMMENDATIONS: 1. Medical therapy of mixed ischemic and non-ischemic cardiomyopathy.  Degree of cardiomyopathy is out of proportion to CAD. 2. Severely diseased OM3 is too small for percutaneous intervention. 3. Aggressive secondary prevention and evidence-based heart failure therapy. 4. Transfer back to [facility] after TR band removal. 5. Restart heparin infusion 2 hours after TR band removal.   Antimicrobials:   None   Subjective: Patient reports that he is relatively unchanged.  He denies any nausea, vomiting, chest pain, palpitations.  Objective: Vitals:   08/07/18 0725 08/07/18 0736 08/07/18 0801 08/07/18 0822  BP: 109/61   103/68  Pulse:  67  (!) 58  Resp:      Temp:      TempSrc:      SpO2:   98%   Weight:      Height:        Intake/Output Summary (Last 24 hours) at 08/07/2018 0835 Last data filed at 08/06/2018 1100 Gross per 24 hour  Intake 222 ml  Output -  Net 222 ml   Filed Weights   08/05/18 0553 08/06/18 0604 08/07/18 0633  Weight: 84.6 kg 84.6 kg 84.4 kg    Examination:  General exam: Appears calm and comfortable;  Respiratory system: No increased work of breathing, no wheezing, no crackles, + scattered rhonchi Cardiovascular system: Bradycardic, regular rhythm, no murmurs Gastrointestinal  system: Soft, nondistended, no rebound or guarding, plus bowel sounds. Central nervous system: Grossly intact, moving all extremities Extremities: 1+ lower extremity edema; compression socks in place Skin: Port site is clean dry and intact Psychiatry: Judgement and insight appear normal. Mood & affect appropriate.     Data Reviewed: I have personally reviewed following labs and imaging studies  CBC: Recent Labs  Lab 08/03/18 0440 08/04/18 0339 08/05/18 0401 08/06/18 0452 08/07/18 0421  WBC 9.4 8.7 10.2 9.0 9.4  HGB 11.2* 10.9* 10.8* 10.4* 10.3*  HCT 35.9* 35.5* 35.0* 33.1* 33.2*  MCV 105.3* 104.7* 103.9* 105.4* 104.1*  PLT 124* 135* 131* 112* 098*   Basic Metabolic Panel: Recent Labs  Lab 08/01/18 0518 08/02/18 0555 08/03/18 0440 08/04/18 0339 08/05/18 0401 08/06/18 0452 08/07/18 0421  NA 139 137 136 137 137 137 137  K 3.7 3.5 4.7 4.1 4.2 3.8 4.2  CL 100 97* 96* 99 101 101 102  CO2 32 29 30 29 28 27 27   GLUCOSE 114* 158* 94 97 97 102* 110*  BUN 18 16 17 20 21 21  25*  CREATININE 0.76 0.92 0.91 0.90 0.87 0.92 0.98  CALCIUM 9.0 8.7* 9.1 9.1 9.0 8.8* 8.9  MG 2.1 2.0 2.0 2.0 2.2  --   --    GFR: Estimated Creatinine Clearance: 79 mL/min (by C-G formula based on SCr of 0.98 mg/dL). Liver Function Tests: Recent Labs  Lab 08/01/18 0518  AST 19  ALT 33  ALKPHOS 77  BILITOT 1.2  PROT 6.4*  ALBUMIN 3.5   No results for input(s): LIPASE, AMYLASE in the last 168 hours. No results for input(s): AMMONIA in the last 168 hours. Coagulation Profile: No  results for input(s): INR, PROTIME in the last 168 hours. Cardiac Enzymes: No results for input(s): CKTOTAL, CKMB, CKMBINDEX, TROPONINI in the last 168 hours. BNP (last 3 results) No results for input(s): PROBNP in the last 8760 hours. HbA1C: No results for input(s): HGBA1C in the last 72 hours. CBG: No results for input(s): GLUCAP in the last 168 hours. Lipid Profile: No results for input(s): CHOL, HDL, LDLCALC,  TRIG, CHOLHDL, LDLDIRECT in the last 72 hours. Thyroid Function Tests: No results for input(s): TSH, T4TOTAL, FREET4, T3FREE, THYROIDAB in the last 72 hours. Anemia Panel: No results for input(s): VITAMINB12, FOLATE, FERRITIN, TIBC, IRON, RETICCTPCT in the last 72 hours. Sepsis Labs: No results for input(s): PROCALCITON, LATICACIDVEN in the last 168 hours.  Recent Results (from the past 240 hour(s))  MRSA PCR Screening     Status: None   Collection Time: 07/30/18  8:50 AM  Result Value Ref Range Status   MRSA by PCR NEGATIVE NEGATIVE Final    Comment:        The GeneXpert MRSA Assay (FDA approved for NASAL specimens only), is one component of a comprehensive MRSA colonization surveillance program. It is not intended to diagnose MRSA infection nor to guide or monitor treatment for MRSA infections. Performed at [facility], [place] [address] [place], [place] [address]          Radiology Studies: Dg Chest Port 1 View  Result Date: 08/06/2018 CLINICAL DATA:  Tachycardia EXAM: PORTABLE CHEST 1 VIEW COMPARISON:  08/02/2018 and prior radiographs FINDINGS: Cardiomegaly noted. RIGHT Port-A-Cath is again identified with tip overlying the UPPER SVC. Mild peribronchial thickening and LEFT basilar scarring again noted. There is no evidence of focal airspace disease, pulmonary edema, suspicious pulmonary nodule/mass, pleural effusion, or pneumothorax. No acute bony abnormalities are identified. IMPRESSION: Cardiomegaly without evidence of acute cardiopulmonary disease. Electronically Signed   By: Margarette Canada M.D.   On: 08/06/2018 13:12        Scheduled Meds: . amiodarone  400 mg Oral Daily  . amiodarone  400 mg Oral BID  . apixaban  5 mg Oral BID  . Chlorhexidine Gluconate Cloth  6 each Topical Daily  . furosemide  40 mg Oral BID  . levalbuterol  0.63 mg Nebulization BID  . loratadine  10 mg Oral Daily  . metoprolol tartrate  12.5 mg Oral Q6H  .  mometasone-formoterol  2 puff Inhalation BID  . nicotine  14 mg Transdermal Daily  . pantoprazole  40 mg Oral Daily  . potassium chloride  20 mEq Oral Daily  . sodium chloride flush  3 mL Intravenous Q12H  . spironolactone  12.5 mg Oral Daily   Continuous Infusions: . sodium chloride 10 mL/hr at 08/04/18 1337  . sodium chloride       LOS: 9 days    Time spent: 35 min    Cristy Folks, MD [facility]  If 7PM-7AM, please contact night-coverage [email or web address] Password [facility] 08/07/2018, 8:35 AM

## 2018-08-07 NOTE — Progress Notes (Addendum)
Progress Note  Patient Name: Brian White Date of Encounter: 08/07/2018  Primary Cardiologist: Kirk Ruths, MD   Subjective   Only pain from compressions.  No severe SOB Converted to SR around 0300 this AM  Inpatient Medications    Scheduled Meds: . amiodarone  400 mg Oral Daily  . amiodarone  400 mg Oral BID  . apixaban  5 mg Oral BID  . Chlorhexidine Gluconate Cloth  6 each Topical Daily  . furosemide  40 mg Oral BID  . levalbuterol  0.63 mg Nebulization BID  . loratadine  10 mg Oral Daily  . metoprolol tartrate  12.5 mg Oral Q6H  . mometasone-formoterol  2 puff Inhalation BID  . nicotine  14 mg Transdermal Daily  . pantoprazole  40 mg Oral Daily  . potassium chloride  20 mEq Oral Daily  . sodium chloride flush  3 mL Intravenous Q12H  . spironolactone  12.5 mg Oral Daily   Continuous Infusions: . sodium chloride 10 mL/hr at 08/04/18 1337  . sodium chloride     PRN Meds: sodium chloride, acetaminophen, guaiFENesin, ipratropium-albuterol, magnesium hydroxide, mirtazapine, morphine injection, ondansetron (ZOFRAN) IV, oxyCODONE, sodium chloride flush, sodium chloride flush   Vital Signs    Vitals:   08/07/18 0725 08/07/18 0736 08/07/18 0801 08/07/18 0822  BP: 109/61   103/68  Pulse:  67  (!) 58  Resp:      Temp:      TempSrc:      SpO2:   98%   Weight:      Height:        Intake/Output Summary (Last 24 hours) at 08/07/2018 0902 Last data filed at 08/06/2018 1100 Gross per 24 hour  Intake 222 ml  Output -  Net 222 ml   Last 3 Weights 08/07/2018 08/06/2018 08/05/2018  Weight (lbs) 186 lb 186 lb 6.4 oz 186 lb 8 oz  Weight (kg) 84.369 kg 84.55 kg 84.596 kg      Telemetry    A flutter to SR with PACs at 0307 today - Personally Reviewed  ECG    No new - Personally Reviewed  Physical Exam   GEN: No acute distress.   Neck: No JVD sitting up right in chair Cardiac: RRR, no murmurs, rubs, or gallops.  Respiratory: rhonchi to auscultation  bilaterally. GI: Soft, nontender, non-distended  MS: 1-2 + edema of lower ext; No deformity. Neuro:  Nonfocal  Psych: Normal affect   Labs    Chemistry Recent Labs  Lab 08/01/18 0518  08/05/18 0401 08/06/18 0452 08/07/18 0421  NA 139   < > 137 137 137  K 3.7   < > 4.2 3.8 4.2  CL 100   < > 101 101 102  CO2 32   < > 28 27 27   GLUCOSE 114*   < > 97 102* 110*  BUN 18   < > 21 21 25*  CREATININE 0.76   < > 0.87 0.92 0.98  CALCIUM 9.0   < > 9.0 8.8* 8.9  PROT 6.4*  --   --   --   --   ALBUMIN 3.5  --   --   --   --   AST 19  --   --   --   --   ALT 33  --   --   --   --   ALKPHOS 77  --   --   --   --   BILITOT 1.2  --   --   --   --  GFRNONAA >60   < > >60 >60 >60  GFRAA >60   < > >60 >60 >60  ANIONGAP 7   < > 8 9 8    < > = values in this interval not displayed.     Hematology Recent Labs  Lab 08/05/18 0401 08/06/18 0452 08/07/18 0421  WBC 10.2 9.0 9.4  RBC 3.37* 3.14* 3.19*  HGB 10.8* 10.4* 10.3*  HCT 35.0* 33.1* 33.2*  MCV 103.9* 105.4* 104.1*  MCH 32.0 33.1 32.3  MCHC 30.9 31.4 31.0  RDW 16.0* 16.0* 15.9*  PLT 131* 112* 144*    Cardiac EnzymesNo results for input(s): TROPONINI in the last 168 hours. No results for input(s): TROPIPOC in the last 168 hours.   BNPNo results for input(s): BNP, PROBNP in the last 168 hours.   DDimer No results for input(s): DDIMER in the last 168 hours.   Radiology    Dg Chest Port 1 View  Result Date: 08/06/2018 CLINICAL DATA:  Tachycardia EXAM: PORTABLE CHEST 1 VIEW COMPARISON:  08/02/2018 and prior radiographs FINDINGS: Cardiomegaly noted. RIGHT Port-A-Cath is again identified with tip overlying the UPPER SVC. Mild peribronchial thickening and LEFT basilar scarring again noted. There is no evidence of focal airspace disease, pulmonary edema, suspicious pulmonary nodule/mass, pleural effusion, or pneumothorax. No acute bony abnormalities are identified. IMPRESSION: Cardiomegaly without evidence of acute cardiopulmonary  disease. Electronically Signed   By: Margarette Canada M.D.   On: 08/06/2018 13:12    Cardiac Studies    LEFT HEART CATH AND CORONARY ANGIOGRAPHY 08/05/18  FINDINGS: 1. Severe single-vessel coronary artery disease. The LCx demonstrated moderate diffuse disease with severe diffuse disease involving OM3 (small caliber branch). 2. Mild, non-obstructive disease involving LCx and dominant RCA. 3. Mildly elevated LVEDP. 4. Inferolateral myocardial calcification consistent with prior infarct/scar.  RECOMMENDATIONS: 1. Medical therapy of mixed ischemic and non-ischemic cardiomyopathy. Degree of cardiomyopathy is out of proportion to CAD. 2. Severely diseased OM3 is too small for percutaneous intervention. 3. Aggressive secondary prevention and evidence-based heart failure therapy. 4. Transfer back to [facility] after TR band removal. 5. Restart heparin infusion 2 hours after TR band removal. Nelva Bush, MD  Diagnostic  Dominance: Right      Echocardiogram 07/30/2018 Study Conclusions - Left ventricle: Diffuse hypokinesis worse in the inferior wall. The cavity size was moderately dilated. Wall thickness was normal. The estimated ejection fraction was 25%. The study is not technically sufficient to allow evaluation of LV diastolic function. - Aortic valve: Sclerosis without stenosis. - Mitral valve: Calcified annulus. Mildly thickened leaflets . - Left atrium: The atrium was moderately dilated. - Atrial septum: No defect or patent foramen ovale was identified.   Patient Profile     68 y.o. male with a history of severely depressed left ventricular systolic function, persistent atrial flutter, COPD, lung cancer and he was admitted with respiratory failure and a flutter with plan for flutter ablation last week until he had a cardiac arrest with ventricular tachycardia.   Assessment & Plan    Atrial flutter/ fib - converted back to SR 0307 this AM.  Prior to cardiac  arrest this admit pt was scheduled for RF ablation last Wed. -previously had been cancelled but with new admit for a flutter and respiratory failure he had agreed.  Now back in SR-- pt wants to follow up with  Dr. [name] and has appt 08/30/18  back for follow up.   In a flutter difficult to control HR.  On eliquis.  On amiodarone 400  mg daily.  On metoprolol 12.5 every 6 hours.   -- discussed with pt and wife to weigh daily and call for wt gain and SOB.  hopefully the amiodarone will keep him out of a flutter.   Post VT arrest - on amiodarone.    CHF- acute on chronic combined  negative 4259 since admit and wt down from 89.1 to 84.4 Kg. On lasix 40 mg BID. Po --EF 25%,--off losartan currently but on spironolactone   CAD with cath 08/05/18 LCx mod diffuse disease and severe diffuse disease, and Om3 too small for PCI.  Medical therapy  COPD/chronic tobacco per IM  Lung cancer - chemo on 07/19/18 had reaction to chemo  Labile BP with difficulty with medications BP 329 to 93 systolic   For questions or updates, please contact [place] Please consult [email or web address] for contact info under        Signed, Cecilie Kicks, NP  08/07/2018, 9:02 AM     Attending Note:   The patient was seen and examined.  Agree with assessment and plan as noted above.  Changes made to the above note as needed.  Patient seen and independently examined with Cecilie Kicks, NP.   We discussed all aspects of the encounter. I agree with the assessment and plan as stated above.  Atrial fibrillation/atrial flutter: The patient has been seen by Dr. [name].  He was previously scheduled for RF ablation but this was postponed.  We will continue amiodarone.  We will get him back to see Dr. [name] for further discussion. Will decrease the amio to 200 BID   2.  Acute on chronic combined congestive heart failure: Continue doing well.  Continue Lasix.  Continue spironolactone  3.  Coronary artery disease: The patient has  small disease diffuse disease involving the distal branches of the left circumflex artery.  Continue medical therapy.   I think he is doing well Walked in the halls without any difficulty. He appears stable for DC either today or tomorrow     I have spent a total of 40 minutes with patient reviewing hospital  notes , telemetry, EKGs, labs and examining patient as well as establishing an assessment and plan that was discussed with the patient. > 50% of time was spent in direct patient care.    Thayer Headings, Brooke Bonito., MD, [facility] 08/07/2018, 12:24 PM [address]  [place] Pager [phone]

## 2018-08-07 NOTE — Discharge Instructions (Signed)
weigh daily and call if weight increases 3 pounds in a day or 5 pounds in a week,  For med adjustment and let them know heart rate.   Low salt diet

## 2018-08-08 LAB — CBC
HCT: 29.6 % — ABNORMAL LOW (ref 39.0–52.0)
Hemoglobin: 9.3 g/dL — ABNORMAL LOW (ref 13.0–17.0)
MCH: 33.5 pg (ref 26.0–34.0)
MCHC: 31.4 g/dL (ref 30.0–36.0)
MCV: 106.5 fL — ABNORMAL HIGH (ref 80.0–100.0)
Platelets: 131 10*3/uL — ABNORMAL LOW (ref 150–400)
RBC: 2.78 MIL/uL — ABNORMAL LOW (ref 4.22–5.81)
RDW: 16 % — ABNORMAL HIGH (ref 11.5–15.5)
WBC: 8.5 10*3/uL (ref 4.0–10.5)
nRBC: 0 % (ref 0.0–0.2)

## 2018-08-08 LAB — BASIC METABOLIC PANEL
Anion gap: 10 (ref 5–15)
BUN: 24 mg/dL — ABNORMAL HIGH (ref 8–23)
CO2: 26 mmol/L (ref 22–32)
Calcium: 8.7 mg/dL — ABNORMAL LOW (ref 8.9–10.3)
Chloride: 100 mmol/L (ref 98–111)
Creatinine, Ser: 1.11 mg/dL (ref 0.61–1.24)
GFR calc Af Amer: >60 mL/min (ref >60)
GFR calc non Af Amer: >60 mL/min (ref >60)
Glucose, Bld: 98 mg/dL (ref 70–99)
Potassium: 4.1 mmol/L (ref 3.5–5.1)
Sodium: 136 mmol/L (ref 135–145)

## 2018-08-08 MED ORDER — AMIODARONE HCL 200 MG PO TABS: 200.0000 mg | ORAL_TABLET | Freq: Two times a day (BID) | ORAL | 1 refills | Status: DC

## 2018-08-08 MED ORDER — HEPARIN SOD (PORK) LOCK FLUSH 100 UNIT/ML IV SOLN
500.0000 [IU] | INTRAVENOUS | Status: AC | PRN
  Administered 2018-08-08: 500 [IU]

## 2018-08-08 MED ORDER — LEVALBUTEROL TARTRATE 45 MCG/ACT IN AERO: 2.0000 | INHALATION_SPRAY | RESPIRATORY_TRACT | 2 refills | Status: AC | PRN

## 2018-08-08 MED ORDER — METOPROLOL TARTRATE 25 MG PO TABS: 25.0000 mg | ORAL_TABLET | Freq: Two times a day (BID) | ORAL | 1 refills | Status: DC

## 2018-08-08 MED ORDER — METOPROLOL TARTRATE 25 MG PO TABS: 25.0000 mg | ORAL_TABLET | Freq: Two times a day (BID) | ORAL | Status: DC

## 2018-08-08 MED ORDER — SPIRONOLACTONE 25 MG PO TABS: 12.5000 mg | ORAL_TABLET | Freq: Every day | ORAL | 1 refills | Status: DC

## 2018-08-08 MED ORDER — POTASSIUM CHLORIDE CRYS ER 20 MEQ PO TBCR: 20.0000 meq | EXTENDED_RELEASE_TABLET | Freq: Every day | ORAL | 1 refills | Status: DC

## 2018-08-08 MED ORDER — FUROSEMIDE 40 MG PO TABS: 40.0000 mg | ORAL_TABLET | Freq: Two times a day (BID) | ORAL | 1 refills | Status: DC

## 2018-08-08 NOTE — Progress Notes (Signed)
PT continues to demonstrate hands on understanding of Flutter device. PC at this time.

## 2018-08-08 NOTE — Discharge Summary (Addendum)
Physician Discharge Summary  Brian White OXB:[id number] DOB: [birth year] DOA: 07/28/2018  PCP: [name], MD  Admit date: 07/28/2018 Discharge date: 08/08/2018  Admitted From: Home Disposition:  Home  Recommendations for Outpatient Follow-up:  1. Follow up with PCP in 1-2 weeks 2. Please obtain BMP/CBC in one week 3. Please weigh yourself daily and eat a low-salt diet.  Home Health: No Equipment/Devices:none  Discharge Condition: stable CODE STATUS: FULL Diet recommendation: Heart Healthy    Brief/Interim Summary:  #) Paroxysmal atrial fibrillation/flutter with RVR: Patient was admitted with atrial flutter with fibrillation/flutter with RVR and started on diltiazem drip.  That night patient went into VT arrest requiring 1 shock and immediately recovered.  Patient was started on amiodarone infusion.  Please see below for management of acute on chronic systolic heart failure exacerbation.  Patient initially converted to sinus rhythm on amiodarone load however began to have runs of atrial fibrillation with rapid ventricular response.  His carvedilol was discontinued and he was started on metoprolol tartrate 12.5 mg every 6 hours with good control of heart rate.  He was continued on his home apixaban.  He will follow-up as an outpatient with Dr. [name] from cardiology.  Patient was discharged on amiodarone as well.  #) Acute on chronic systolic heart failure exacerbation due to mixed ischemic and nonischemic cardiomyopathy: Echo done after VT arrest showed EF of 20 to 25% with wall motion abnormalities.  Patient was given IV diuretics with resolution of heart failure symptoms.  He was continued on beta-blocker.  Initially his doses of ARB and spironolactone were titrated up but patient became hypotensive and so these were dropped down to his doses.  #) Coronary artery disease/VT arrest: Echo done after VT arrest showed evidence of wall motion abnormalities.  It was felt the VT arrest was  secondary likely to heart failure symptoms.  Patient to cardiac catheterization which showed severe OM 3 disease not abnormal to stenting.  Patient was continued on beta-blocker and ARB.  Patient was already on apixaban so aspirin was not started.  Patient was continued on amiodarone per above.  #) COPD with chronic tobacco abuse: Patient was given nicotine patch.  He was maintained on ICS/LABA.  He was given PRN bronchodilators.  He was discharged home on PRN levalbuterol.  #) Stage IIIb non-small cell lung cancer: Patient last received chemotherapy on 07/19/2018 with pembrolizumab, pemetrexed, carboplatin but apparently had a reaction to this chemotherapy.  Patient will follow-up as outpatient for his additional chemotherapy regimens.  Discharge Diagnoses:  Principal Problem:   Atrial fibrillation with RVR (HCC) Active Problems:   HTN (hypertension)   COPD (chronic obstructive pulmonary disease) (HCC)   Smoker   Cancer of lower lobe of left lung (HCC)   Anticoagulated   PAF (paroxysmal atrial fibrillation) (HCC)   Acute systolic heart failure (HCC)   Ventricular tachycardia (HCC)   Primary malignant neoplasm of lung metastatic to other site [facility])   Acute on chronic combined systolic and diastolic CHF (congestive heart failure) [facility])    Discharge Instructions  Discharge Instructions    Call MD for:  difficulty breathing, headache or visual disturbances   Complete by:  As directed    Call MD for:  hives   Complete by:  As directed    Call MD for:  persistant dizziness or light-headedness   Complete by:  As directed    Call MD for:  persistant nausea and vomiting   Complete by:  As directed    Call MD  for:  redness, tenderness, or signs of infection (pain, swelling, redness, odor or green/yellow discharge around incision site)   Complete by:  As directed    Call MD for:  severe uncontrolled pain   Complete by:  As directed    Call MD for:  temperature >100.4   Complete by:  As  directed    Diet - low sodium heart healthy   Complete by:  As directed    Discharge instructions   Complete by:  As directed    Please follow-up with your primary care doctor in 1 week.  Please double up on your dose of Lasix if you notice a weight gain of more than 4 pounds, swelling in your legs, shortness of breath when you lie down flat.  Please call your primary care doctor if you double up on your dose.   Increase activity slowly   Complete by:  As directed      Allergies as of 08/08/2018      Reactions   Onion Anaphylaxis      Cyramza [ramucirumab] Itching   Itching, erythema, & tearing   Taxotere [docetaxel] Itching   Itching, erythema, & tearing      Medication List    STOP taking these medications   carvedilol 3.125 MG tablet Commonly known as:  COREG   ibuprofen 200 MG tablet Commonly known as:  ADVIL,MOTRIN     TAKE these medications   acetaminophen 500 MG tablet Commonly known as:  TYLENOL Take 1,000 mg by mouth every 6 (six) hours as needed (for pain.).   amiodarone 200 MG tablet Commonly known as:  PACERONE Take 1 tablet (200 mg total) by mouth 2 (two) times daily for 30 days.   apixaban 5 MG Tabs tablet Commonly known as:  ELIQUIS Take 1 tablet (5 mg total) by mouth 2 (two) times daily.   budesonide-formoterol 160-4.5 MCG/ACT inhaler Commonly known as:  SYMBICORT Inhale 1 puff into the lungs 2 (two) times daily.   cetirizine 10 MG chewable tablet Commonly known as:  ZYRTEC Chew 10 mg by mouth daily as needed (for seasonal allergies).   dexamethasone 4 MG tablet Commonly known as:  DECADRON TAKE 2 TABLET BY MOUTH TWICE DAILY THE DAY BEFORE, DAY OF AND AFTER THE CHEMOTHERAPY EVERY 3 WEEKS   folic acid 1 MG tablet Commonly known as:  FOLVITE TAKE 1 TABLET(1 MG) BY MOUTH DAILY   furosemide 40 MG tablet Commonly known as:  LASIX Take 1 tablet (40 mg total) by mouth 2 (two) times daily for 30 days. What changed:    medication strength  how  much to take  when to take this  reasons to take this   guaiFENesin 600 MG 12 hr tablet Commonly known as:  MUCINEX Take 600 mg by mouth 2 (two) times daily as needed (for cough/congestion).   levalbuterol 45 MCG/ACT inhaler Commonly known as:  XOPENEX HFA Inhale 2 puffs into the lungs every 4 (four) hours as needed for wheezing.   lidocaine-prilocaine cream Commonly known as:  EMLA Apply 1 application topically as needed. Squeeze a  small amount of cream to cotton ball and apply to port site 1-2 hours prior to chemotherapy. Do not rub in the cream and cover  with plastic wrap.   losartan 25 MG tablet Commonly known as:  COZAAR Take 1 tablet (25 mg total) by mouth daily.   methylPREDNISolone 4 MG Tbpk tablet Commonly known as:  MEDROL DOSEPAK 6 x 1 day, 5 x 1  day, 4 x 1 day, 3 x 1 day, 2 x 1 day, 1 x 1 day   metoprolol tartrate 25 MG tablet Commonly known as:  LOPRESSOR Take 1 tablet (25 mg total) by mouth 2 (two) times daily for 30 days.   mirtazapine 15 MG tablet Commonly known as:  REMERON TAKE 1 TABLET(15 MG) BY MOUTH AT BEDTIME What changed:  See the new instructions.   omeprazole 20 MG capsule Commonly known as:  PRILOSEC Take 1 capsule (20 mg total) by mouth daily.   potassium chloride SA 20 MEQ tablet Commonly known as:  K-DUR,KLOR-CON Take 1 tablet (20 mEq total) by mouth daily for 30 days.   prochlorperazine 10 MG tablet Commonly known as:  COMPAZINE Take 1 tablet (10 mg total) by mouth every 6 (six) hours as needed for nausea or vomiting.   spironolactone 25 MG tablet Commonly known as:  ALDACTONE Take 0.5 tablets (12.5 mg total) by mouth daily.   triamcinolone cream 0.5 % Commonly known as:  KENALOG Apply 1 application topically 3 (three) times daily.      Follow-up Information    Lelon Perla, MD Follow up on 08/23/2018.   Specialty:  Cardiology Why:  at 10:40 AM Contact information: [address] [place]  [address] [phone]        Constance Haw, MD Follow up on 08/30/2018.   Specialty:  Cardiology Why:  at [facility] information: [address] [place] [address] [phone]          Allergies  Allergen Reactions  . Onion Anaphylaxis       . Cyramza [Ramucirumab] Itching    Itching, erythema, & tearing  . Taxotere [Docetaxel] Itching    Itching, erythema, & tearing    Consultations:  Cardiology   Procedures/Studies: Dg Chest 1 View  Result Date: 07/30/2018 CLINICAL DATA:  Shortness of breath EXAM: CHEST  1 VIEW COMPARISON:  Two days ago FINDINGS: Chronic cardiomegaly. Porta catheter on the right with tip the level of the right brachiocephalic. There is no edema, consolidation, effusion, or pneumothorax. IMPRESSION: No evidence of active disease. Electronically Signed   By: Monte Fantasia M.D.   On: 07/30/2018 04:22   Dg Chest 2 View  Result Date: 08/02/2018 CLINICAL DATA:  Shortness of breath following CPR several days ago EXAM: CHEST - 2 VIEW COMPARISON:  07/30/2018 FINDINGS: Cardiac shadow is stable at the upper limits of normal in size. Right chest wall port is noted. The lungs are well aerated without focal infiltrate. No acute rib fractures are seen. No pneumothorax is noted. Pericardial calcifications are noted posteriorly. IMPRESSION: No acute abnormality noted. Electronically Signed   By: Inez Catalina M.D.   On: 08/02/2018 15:01   Ct Chest W Contrast  Result Date: 07/18/2018 CLINICAL DATA:  Patient with history of non-small cell lung cancer status post chemotherapy and radiation. EXAM: CT CHEST, ABDOMEN, AND PELVIS WITH CONTRAST TECHNIQUE: Multidetector CT imaging of the chest, abdomen and pelvis was performed following the standard protocol during bolus administration of intravenous contrast. CONTRAST:  170mL OMNIPAQUE IOHEXOL 300 MG/ML  SOLN COMPARISON:  CT CAP 04/22/2018 FINDINGS: CT CHEST FINDINGS Cardiovascular: Heart is enlarged. New  small pericardial effusion. Coronary arterial vascular calcifications. Thoracic aortic vascular calcifications. Mediastinum/Nodes: Interval increase in size of soft tissue within the AP window measuring 3.5 x 3.9 cm (image 23; series 2), previously 2.9 x 2.4 cm. Increase in left infrahilar adenopathy measuring 1.5 cm (image 34;  series 2), previously 0.7 cm. Increased left infrahilar and left hilar soft tissue fullness. Additionally, there is new vague soft tissue fullness within the right hilum (image 21; series 2). Lungs/Pleura: Marked narrowing of the left mainstem bronchus (image 63; series 4). Additionally there is narrowing of the lingular and left lower lobe bronchi. Interval increase in nodularity within the medial left lower lobe at the site of associated scarring with nodularity measuring up to 1.1 cm (image 90; series 4), previously 0.7 cm. Unchanged 6 mm low-attenuation nodule left upper lobe (image 33; series 4). New small left layering pleural effusion. No pneumothorax. Patchy ground-glass and consolidative opacities within the medial right upper lobe (image 54; series 4), new from prior. Musculoskeletal: Thoracic spine degenerative changes. No aggressive or acute appearing osseous lesions. CT ABDOMEN PELVIS FINDINGS Hepatobiliary: The liver is normal in size and contour. Fatty deposition adjacent to the falciform ligament. Gallbladder is decompressed. No intrahepatic or extrahepatic biliary ductal dilatation. Pancreas: Unremarkable Spleen: Unremarkable Adrenals/Urinary Tract: Normal adrenal glands. Bilateral perinephric fat stranding. Urinary bladder wall thickening. No after fro CIS. Stomach/Bowel: No abnormal bowel wall thickening or evidence for bowel obstruction. No free fluid or free intraperitoneal air. No are of the stomach. Vascular/Lymphatic: Normal caliber abdominal aorta. Interval increase in size of aortocaval lymph node measuring 2.4 x 1.8 cm (image 70; series 2), previously 2.2 x 1.6 cm.  1.4 cm preaortic lymph node (image 75; series 2), unchanged from prior. Interval increase in size of gastrohepatic node measuring 2.0 cm (image 55; series 2), previously 1.4 cm. Reproductive: Heterogeneous enlarged prostate. Other: Small bilateral fat containing inguinal hernias. Musculoskeletal: Lumbar spine degenerative changes. No aggressive or acute appearing osseous lesions. IMPRESSION: 1. Interval increase in size of nodal mass within the AP window as well as soft tissue extending into the left hilum. There is associated narrowing of the left lung bronchi. 2. Increased nodularity within the left lower lobe which may represent disease progression. New small left pleural effusion. 3. Interval increase in size of a few of the retroperitoneal lymph nodes and gastrohepatic node within the abdomen. 4. New ground-glass and consolidative opacities within the medial right upper lobe which may be infectious/inflammatory in etiology. Recommend attention on follow-up. Electronically Signed   By: Lovey Newcomer M.D.   On: 07/18/2018 13:05   Ct Abdomen Pelvis W Contrast  Result Date: 07/18/2018 CLINICAL DATA:  Patient with history of non-small cell lung cancer status post chemotherapy and radiation. EXAM: CT CHEST, ABDOMEN, AND PELVIS WITH CONTRAST TECHNIQUE: Multidetector CT imaging of the chest, abdomen and pelvis was performed following the standard protocol during bolus administration of intravenous contrast. CONTRAST:  166mL OMNIPAQUE IOHEXOL 300 MG/ML  SOLN COMPARISON:  CT CAP 04/22/2018 FINDINGS: CT CHEST FINDINGS Cardiovascular: Heart is enlarged. New small pericardial effusion. Coronary arterial vascular calcifications. Thoracic aortic vascular calcifications. Mediastinum/Nodes: Interval increase in size of soft tissue within the AP window measuring 3.5 x 3.9 cm (image 23; series 2), previously 2.9 x 2.4 cm. Increase in left infrahilar adenopathy measuring 1.5 cm (image 34; series 2), previously 0.7 cm. Increased  left infrahilar and left hilar soft tissue fullness. Additionally, there is new vague soft tissue fullness within the right hilum (image 21; series 2). Lungs/Pleura: Marked narrowing of the left mainstem bronchus (image 63; series 4). Additionally there is narrowing of the lingular and left lower lobe bronchi. Interval increase in nodularity within the medial left lower lobe at the site of associated scarring with nodularity measuring up to 1.1 cm (image 90;  series 4), previously 0.7 cm. Unchanged 6 mm low-attenuation nodule left upper lobe (image 33; series 4). New small left layering pleural effusion. No pneumothorax. Patchy ground-glass and consolidative opacities within the medial right upper lobe (image 54; series 4), new from prior. Musculoskeletal: Thoracic spine degenerative changes. No aggressive or acute appearing osseous lesions. CT ABDOMEN PELVIS FINDINGS Hepatobiliary: The liver is normal in size and contour. Fatty deposition adjacent to the falciform ligament. Gallbladder is decompressed. No intrahepatic or extrahepatic biliary ductal dilatation. Pancreas: Unremarkable Spleen: Unremarkable Adrenals/Urinary Tract: Normal adrenal glands. Bilateral perinephric fat stranding. Urinary bladder wall thickening. No after fro CIS. Stomach/Bowel: No abnormal bowel wall thickening or evidence for bowel obstruction. No free fluid or free intraperitoneal air. No are of the stomach. Vascular/Lymphatic: Normal caliber abdominal aorta. Interval increase in size of aortocaval lymph node measuring 2.4 x 1.8 cm (image 70; series 2), previously 2.2 x 1.6 cm. 1.4 cm preaortic lymph node (image 75; series 2), unchanged from prior. Interval increase in size of gastrohepatic node measuring 2.0 cm (image 55; series 2), previously 1.4 cm. Reproductive: Heterogeneous enlarged prostate. Other: Small bilateral fat containing inguinal hernias. Musculoskeletal: Lumbar spine degenerative changes. No aggressive or acute appearing  osseous lesions. IMPRESSION: 1. Interval increase in size of nodal mass within the AP window as well as soft tissue extending into the left hilum. There is associated narrowing of the left lung bronchi. 2. Increased nodularity within the left lower lobe which may represent disease progression. New small left pleural effusion. 3. Interval increase in size of a few of the retroperitoneal lymph nodes and gastrohepatic node within the abdomen. 4. New ground-glass and consolidative opacities within the medial right upper lobe which may be infectious/inflammatory in etiology. Recommend attention on follow-up. Electronically Signed   By: Lovey Newcomer M.D.   On: 07/18/2018 13:05   Dg Chest Port 1 View  Result Date: 08/06/2018 CLINICAL DATA:  Tachycardia EXAM: PORTABLE CHEST 1 VIEW COMPARISON:  08/02/2018 and prior radiographs FINDINGS: Cardiomegaly noted. RIGHT Port-A-Cath is again identified with tip overlying the UPPER SVC. Mild peribronchial thickening and LEFT basilar scarring again noted. There is no evidence of focal airspace disease, pulmonary edema, suspicious pulmonary nodule/mass, pleural effusion, or pneumothorax. No acute bony abnormalities are identified. IMPRESSION: Cardiomegaly without evidence of acute cardiopulmonary disease. Electronically Signed   By: Margarette Canada M.D.   On: 08/06/2018 13:12   Dg Chest Port 1 View  Result Date: 07/28/2018 CLINICAL DATA:  Shortness of breath. EXAM: PORTABLE CHEST 1 VIEW COMPARISON:  Radiographs of May 01, 2018. FINDINGS: Stable cardiomegaly. Right internal jugular Port-A-Cath is unchanged in position. No pneumothorax or pleural effusion is noted. Bony thorax is unremarkable. IMPRESSION: No acute cardiopulmonary abnormality seen. Electronically Signed   By: Marijo Conception, M.D.   On: 07/28/2018 12:22   07/30/2018 echo: - Left ventricle: Diffuse hypokinesis worse in the inferior wall. The cavity size was moderately dilated. Wall thickness was normal. The  estimated ejection fraction was 25%. The study is not technically sufficient to allow evaluation of LV diastolic function. - Aortic valve: Sclerosis without stenosis. - Mitral valve: Calcified annulus. Mildly thickened leaflets . - Left atrium: The atrium was moderately dilated.  - Atrial septum: No defect or patent foramen ovale was identified.  08/05/2018 cardiac catheterization:FINDINGS: 1. Severe single-vessel coronary artery disease. The LCx demonstrated moderate diffuse disease with severe diffuse disease involving OM3 (small caliber branch). 2. Mild, non-obstructive disease involving LCx and dominant RCA. 3. Mildly elevated LVEDP. 4. Inferolateral myocardial  calcification consistent with prior infarct/scar.  RECOMMENDATIONS: 1. Medical therapy of mixed ischemic and non-ischemic cardiomyopathy. Degree of cardiomyopathy is out of proportion to CAD. 2. Severely diseased OM3 is too small for percutaneous intervention. 3. Aggressive secondary prevention and evidence-based heart failure therapy. 4. Transfer back to [facility] after TR band removal. 5. Restart heparin infusion 2 hours after TR band removal.    Subjective:   Discharge Exam: Vitals:   08/08/18 0652 08/08/18 0759  BP:    Pulse: 62   Resp:    Temp:    SpO2:  97%   Vitals:   08/08/18 0043 08/08/18 0622 08/08/18 0652 08/08/18 0759  BP: (!) 105/58 100/60    Pulse: (!) 52 (!) 50 62   Resp:  18    Temp:  98 F (36.7 C)    TempSrc:  Oral    SpO2:  100%  97%  Weight:  85 kg    Height:       General exam: Appears calm and comfortable;  Respiratory system: No increased work of breathing, no wheezing, no crackles, + scattered rhonchi Cardiovascular system: Bradycardic, regular rhythm, no murmurs Gastrointestinal system: Soft, nondistended, no rebound or guarding, plus bowel sounds. Central nervous system: Grossly intact, moving all extremities Extremities: trace lower extremity edema; compression socks  in place Skin: Port site is clean dry and intact Psychiatry: Judgement and insight appear normal. Mood & affect appropriate.      The results of significant diagnostics from this hospitalization (including imaging, microbiology, ancillary and laboratory) are listed below for reference.     Microbiology: Recent Results (from the past 240 hour(s))  MRSA PCR Screening     Status: None   Collection Time: 07/30/18  8:50 AM  Result Value Ref Range Status   MRSA by PCR NEGATIVE NEGATIVE Final    Comment:        The GeneXpert MRSA Assay (FDA approved for NASAL specimens only), is one component of a comprehensive MRSA colonization surveillance program. It is not intended to diagnose MRSA infection nor to guide or monitor treatment for MRSA infections. Performed at [facility], [place] [address] [place], [place] [address]      Labs: BNP (last 3 results) No results for input(s): BNP in the last 8760 hours. Basic Metabolic Panel: Recent Labs  Lab 08/02/18 0555 08/03/18 0440 08/04/18 0339 08/05/18 0401 08/06/18 0452 08/07/18 0421 08/08/18 0330  NA 137 136 137 137 137 137 136  K 3.5 4.7 4.1 4.2 3.8 4.2 4.1  CL 97* 96* 99 101 101 102 100  CO2 29 30 29 28 27 27 26   GLUCOSE 158* 94 97 97 102* 110* 98  BUN 16 17 20 21 21  25* 24*  CREATININE 0.92 0.91 0.90 0.87 0.92 0.98 1.11  CALCIUM 8.7* 9.1 9.1 9.0 8.8* 8.9 8.7*  MG 2.0 2.0 2.0 2.2  --   --   --    Liver Function Tests: No results for input(s): AST, ALT, ALKPHOS, BILITOT, PROT, ALBUMIN in the last 168 hours. No results for input(s): LIPASE, AMYLASE in the last 168 hours. No results for input(s): AMMONIA in the last 168 hours. CBC: Recent Labs  Lab 08/04/18 0339 08/05/18 0401 08/06/18 0452 08/07/18 0421 08/08/18 0330  WBC 8.7 10.2 9.0 9.4 8.5  HGB 10.9* 10.8* 10.4* 10.3* 9.3*  HCT 35.5* 35.0* 33.1* 33.2* 29.6*  MCV 104.7* 103.9* 105.4* 104.1* 106.5*  PLT 135* 131* 112* 144* 131*   Cardiac  Enzymes: No results for input(s):  CKTOTAL, CKMB, CKMBINDEX, TROPONINI in the last 168 hours. BNP: Invalid input(s): POCBNP CBG: No results for input(s): GLUCAP in the last 168 hours. D-Dimer No results for input(s): DDIMER in the last 72 hours. Hgb A1c No results for input(s): HGBA1C in the last 72 hours. Lipid Profile No results for input(s): CHOL, HDL, LDLCALC, TRIG, CHOLHDL, LDLDIRECT in the last 72 hours. Thyroid function studies No results for input(s): TSH, T4TOTAL, T3FREE, THYROIDAB in the last 72 hours.  Invalid input(s): FREET3 Anemia work up No results for input(s): VITAMINB12, FOLATE, FERRITIN, TIBC, IRON, RETICCTPCT in the last 72 hours. Urinalysis    Component Value Date/Time   PROTEINUR NEGATIVE 07/25/2018 0749   Sepsis Labs Invalid input(s): PROCALCITONIN,  WBC,  LACTICIDVEN Microbiology Recent Results (from the past 240 hour(s))  MRSA PCR Screening     Status: None   Collection Time: 07/30/18  8:50 AM  Result Value Ref Range Status   MRSA by PCR NEGATIVE NEGATIVE Final    Comment:        The GeneXpert MRSA Assay (FDA approved for NASAL specimens only), is one component of a comprehensive MRSA colonization surveillance program. It is not intended to diagnose MRSA infection nor to guide or monitor treatment for MRSA infections. Performed at [facility], [place] [address] [place], [place] [address]      Time coordinating discharge: 39  SIGNED:   Cristy Folks, MD  [facility] 08/08/2018, 10:56 AM   If 7PM-7AM, please contact night-coverage [email or web address] Password [email or web address]

## 2018-08-08 NOTE — Care Management Note (Signed)
Case Management Note  Patient Details  Name: Brian White MRN: [id number] Date of Birth: February 26, 1951  Subjective/Objective: No CM needs.                   Action/Plan:dc home.   Expected Discharge Date:  08/08/18               Expected Discharge Plan:  Home/Self Care  In-House Referral:     Discharge planning Services  CM Consult  Post Acute Care Choice:    Choice offered to:     DME Arranged:    DME Agency:     HH Arranged:    HH Agency:     Status of Service:  Completed, signed off  If discussed at [employer] of Stay Meetings, dates discussed:    Additional Comments:  Dessa Phi, RN 08/08/2018, 10:59 AM

## 2018-08-11 ENCOUNTER — Other Ambulatory Visit: Payer: Self-pay | Admitting: Internal Medicine

## 2018-08-11 DIAGNOSIS — Z7189 Other specified counseling: Secondary | ICD-10-CM

## 2018-08-11 DIAGNOSIS — Z5112 Encounter for antineoplastic immunotherapy: Secondary | ICD-10-CM

## 2018-08-11 DIAGNOSIS — C3432 Malignant neoplasm of lower lobe, left bronchus or lung: Secondary | ICD-10-CM

## 2018-08-11 DIAGNOSIS — R5382 Chronic fatigue, unspecified: Secondary | ICD-10-CM

## 2018-08-11 DIAGNOSIS — Z23 Encounter for immunization: Secondary | ICD-10-CM

## 2018-08-12 ENCOUNTER — Encounter: Payer: Self-pay | Admitting: Internal Medicine

## 2018-08-12 ENCOUNTER — Telehealth: Payer: Self-pay

## 2018-08-12 NOTE — Telephone Encounter (Signed)
Transition Care Management Follow-up Telephone Call   Date discharged? 08/08/18   How have you been since you were released from the hospital? "No energy and weak"   Do you understand why you were in the hospital? YES   Do you understand the discharge instructions? YES   Where were you discharged to? HOME   Items Reviewed:  Medications reviewed: YES  Allergies reviewed: YES  Dietary changes reviewed: YES  Referrals reviewed: YES   Functional Questionnaire:   Activities of Daily Living (ADLs):   He states they are independent in the following: grooming, dressing, ambulation, bathing, feeding and going to the bathroom States they require assistance with the following: Nothing   Any transportation issues/concerns?: NO  Any patient concerns? NO   Confirmed importance and date/time of follow-up visits scheduled YES    Provider Appointment booked with Dr. Danise Mina 08/21/18 at 10:00 am.  Confirmed with patient if condition begins to worsen call PCP or go to the ER.  Patient was given the office number and encouraged to call back with question or concerns.  : YES

## 2018-08-12 NOTE — Progress Notes (Signed)
HPI: FU atrial flutter; patient seen in October 2019 with new onset typical atrial flutter.  Echocardiogram showed ejection fraction 25 to 30%, mild mitral regurgitation and mild left atrial enlargement. Cardiomyopathy felt potentially to be tachycardia mediated. He converted spontaneously to sinus rhythm. He was referred to electrophysiology for ablation.  He returned in January with recurrent atrial flutter.  While in the hospital he had sustained monomorphic ventricular tachycardia and was pulseless by report.  He was defibrillated.  Echocardiogram was repeated and showed an ejection fraction of 25% and moderate left atrial enlargement.  Cardiac catheterization revealed moderate diffuse circumflex disease with severe diffuse disease involving third marginal.  No other obstructive disease noted.  Medical therapy recommended.  Cardiomyopathy felt out of proportion to coronary artery disease.  Patient treated with amiodarone for ventricular tachycardia.  Note patient also had a CT in January 2020 that showed probable progression of his lung cancer.  Since last seen patient denies dyspnea, chest pain, palpitations, syncope or pedal edema.  Current Outpatient Medications  Medication Sig Dispense Refill  . acetaminophen (TYLENOL) 500 MG tablet Take 1,000 mg by mouth every 6 (six) hours as needed (for pain.).    Marland Kitchen amiodarone (PACERONE) 200 MG tablet Take 1 tablet (200 mg total) by mouth 2 (two) times daily for 30 days. 60 tablet 1  . apixaban (ELIQUIS) 5 MG TABS tablet Take 1 tablet (5 mg total) by mouth 2 (two) times daily. 60 tablet 5  . budesonide-formoterol (SYMBICORT) 160-4.5 MCG/ACT inhaler Inhale 1 puff into the lungs 2 (two) times daily. 3 Inhaler 3  . cetirizine (ZYRTEC) 10 MG chewable tablet Chew 10 mg by mouth daily as needed (for seasonal allergies).     . folic acid (FOLVITE) 1 MG tablet TAKE 1 TABLET(1 MG) BY MOUTH DAILY 30 tablet 0  . furosemide (LASIX) 40 MG tablet Take 1 tablet (40  mg total) by mouth 2 (two) times daily for 30 days. 60 tablet 1  . guaiFENesin (MUCINEX) 600 MG 12 hr tablet Take 600 mg by mouth 2 (two) times daily as needed (for cough/congestion).     Marland Kitchen levalbuterol (XOPENEX HFA) 45 MCG/ACT inhaler Inhale 2 puffs into the lungs every 4 (four) hours as needed for wheezing. 1 Inhaler 2  . lidocaine-prilocaine (EMLA) cream Apply 1 application topically as needed. Squeeze a  small amount of cream to cotton ball and apply to port site 1-2 hours prior to chemotherapy. Do not rub in the cream and cover  with plastic wrap. 30 g 0  . losartan (COZAAR) 25 MG tablet Take 1 tablet (25 mg total) by mouth daily. 30 tablet 5  . metoprolol tartrate (LOPRESSOR) 25 MG tablet Take 1 tablet (25 mg total) by mouth 2 (two) times daily for 30 days. 60 tablet 1  . mirtazapine (REMERON) 15 MG tablet TAKE 1 TABLET(15 MG) BY MOUTH AT BEDTIME 30 tablet 0  . omeprazole (PRILOSEC) 20 MG capsule Take 1 capsule (20 mg total) by mouth daily. 30 capsule 1  . potassium chloride SA (K-DUR,KLOR-CON) 20 MEQ tablet Take 1 tablet (20 mEq total) by mouth daily for 30 days. 30 tablet 1  . prochlorperazine (COMPAZINE) 10 MG tablet Take 1 tablet (10 mg total) by mouth every 6 (six) hours as needed for nausea or vomiting. 30 tablet 0  . spironolactone (ALDACTONE) 25 MG tablet Take 0.5 tablets (12.5 mg total) by mouth daily. 30 tablet 1  . triamcinolone cream (KENALOG) 0.5 % Apply 1 application topically  3 (three) times daily. 60 g 0  . dexamethasone (DECADRON) 4 MG tablet TAKE 2 TABLET BY MOUTH TWICE DAILY THE DAY BEFORE, DAY OF AND AFTER THE CHEMOTHERAPY EVERY 3 WEEKS (Patient not taking: Reported on 08/23/2018) 40 tablet 2   No current facility-administered medications for this visit.      Past Medical History:  Diagnosis Date  . Adenocarcinoma, lung ([place]) 10/08/2015   2.4cm spiculated LLL nodule suspicious for primary bronchogenic carcinoma by CT 09/2015 - adenocarcinoma by endobronchial biopsy  .  Allergy   . Arthritis   . CAD (coronary artery disease)    mild MI; pt unaware - seen on stress test  . COPD (chronic obstructive pulmonary disease) ([place]) 06/26/2011   Mild centrilobular and paraseptal emphysema by CT 09/2015   . ED (erectile dysfunction)   . H/O anaphylactic shock 2013   hives,hypotension,syncope  . H/O seasonal allergies   . Herpes zoster 12/06/2015  . Hypertension   . Polycythemia 01/25/2014  . Prostate cancer [facility] [place]) 2013   s/p radiation therapy March 2013, planned f/u with uro after XRT  . Radiation 09/18/2011 through 11/10/2011   Prostate 7600 cGy 40 sessions, seminal vesicles 5600 cGy 40 sessions    . Radiation 11/10/15-12/22/15   chest 60 Gy  . Shortness of breath dyspnea   . Smoker   . Thoracic aorta atherosclerosis ([place]) 10/08/2015   Mild by CT 09/2015     Past Surgical History:  Procedure Laterality Date  . COLONOSCOPY W/ BIOPSIES AND POLYPECTOMY  10/11/15   mult TAs, 3cm rectal polypoid lesion; rec further treatment, diverticulosis ([place])  . ENDOBRONCHIAL ULTRASOUND Bilateral 10/18/2015   Procedure: ENDOBRONCHIAL ULTRASOUND;  Surgeon: Javier Glazier, MD;  Location: [facility];  Service: Cardiopulmonary;  Laterality: Bilateral;  . HERNIA REPAIR Right 2005   with mesh  . INSERTION PROSTATE RADIATION SEED  08/2011   prostate cancer, [place]  . IR FLUORO GUIDE PORT INSERTION RIGHT  07/26/2017  . IR US GUIDE VASC ACCESS RIGHT  07/26/2017  . LEFT HEART CATH AND CORONARY ANGIOGRAPHY N/A 08/05/2018   Procedure: LEFT HEART CATH AND CORONARY ANGIOGRAPHY;  Surgeon: Nelva Bush, MD;  Location: [facility];  Service: Cardiovascular;  Laterality: N/A;    Social History   Socioeconomic History  . Marital status: Married    Spouse name: Not on file  . Number of children: Y  . Years of education: Not on file  . Highest education level: Not on file  Occupational History  . Occupation: retired  Scientific laboratory technician  . Financial  resource strain: Not on file  . Food insecurity:    Worry: Not on file    Inability: Not on file  . Transportation needs:    Medical: Not on file    Non-medical: Not on file  Tobacco Use  . Smoking status: Heavy Tobacco Smoker    Packs/day: 0.67    Years: 54.00    Pack years: 36.18    Types: Cigarettes    Start date: 01/16/1967    Last attempt to quit: 11/06/2015    Years since quitting: 2.7  . Smokeless tobacco: Never Used  . Tobacco comment: Peak rate of 2ppd; quit on Chantix but started again   Substance and Sexual Activity  . Alcohol use: Yes    Alcohol/week: 1.0 standard drinks    Types: 1 Shots of liquor per week    Comment: 2-3 beers or mixed drinks daily    . Drug use: Yes    Types:  Marijuana    Comment: marijuana occasional weekly  . Sexual activity: Yes    Birth control/protection: None  Lifestyle  . Physical activity:    Days per week: Not on file    Minutes per session: Not on file  . Stress: Not on file  Relationships  . Social connections:    Talks on phone: Not on file    Gets together: Not on file    Attends religious service: Not on file    Active member of club or organization: Not on file    Attends meetings of clubs or organizations: Not on file    Relationship status: Not on file  . Intimate partner violence:    Fear of current or ex partner: Not on file    Emotionally abused: Not on file    Physically abused: Not on file    Forced sexual activity: Not on file  Other Topics Concern  . Not on file  Social History Narrative   Lives with wife, dogs, chickens and goats   Occupation: retired.  On disability for herinated discs in back   Edu: HS   Activity: works outside [employer], takes care of farm animals   Diet: good water, fruits/vegetables daily      [facility]:   Originally from [place]. 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 No known asbestos exposure. Questionable  mold exposure. Has 2 dogs currently. No recent bird exposure. Previously had chickens.     Family History  Problem Relation Age of Onset  . Asthma Father   . COPD Father   . CAD Father        possibly  . Emphysema Father   . Breast cancer Sister   . Atrial fibrillation Sister   . Hypertension Mother   . Diabetes Neg Hx   . Stroke Neg Hx   . Colon cancer Neg Hx   . Colon polyps Neg Hx   . Rectal cancer Neg Hx   . Stomach cancer Neg Hx     ROS: no fevers or chills, productive cough, hemoptysis, dysphasia, odynophagia, melena, hematochezia, dysuria, hematuria, rash, seizure activity, orthopnea, PND, pedal edema, claudication. Remaining systems are negative.  Physical Exam: Well-developed well-nourished in no acute distress.  Skin is warm and dry.  HEENT is normal.  Neck is supple.  Chest with diminished breath sounds throughout and mild expiratory wheeze Cardiovascular exam is regular rate and rhythm.  Abdominal exam nontender or distended. No masses palpated. Extremities show no edema. neuro grossly intact  A/P  1 coronary artery disease-patient denies chest pain.  Will not treat with aspirin given need for anticoagulation.  2 nonischemic cardiomyopathy-cardiomyopathy out of proportion to coronary disease.  We will continue medical therapy.  Continue ARB.  Discontinue metoprolol and treat with Toprol 25 mg daily.  Not a candidate for ICD given metastatic lung cancer.  3 chronic systolic congestive heart failure-he appears to be euvolemic on examination.  Continue present dose of diuretics.  Continue fluid restriction and low-sodium diet.  Check potassium and renal function in 4 weeks.  4 ventricular tachycardia-continue amiodarone.  Decrease to 200 mg daily.  5 atrial flutter-plan to continue apixaban and amiodarone.  He is sinus rhythm on examination.  He declines anticoagulation.  6 non-small cell lung cancer-management per oncology.  7 tobacco abuse-patient counseled  on discontinuing.  Kirk Ruths, MD

## 2018-08-12 NOTE — Telephone Encounter (Signed)
Called on 08/09/2018 and left a message for patient to call back to complete TCM call and schedule appointment for follow up. This was not documented on 08/09/2018 as an error on my part. Documenting now.

## 2018-08-14 ENCOUNTER — Inpatient Hospital Stay: Payer: BLUE CROSS/BLUE SHIELD

## 2018-08-14 ENCOUNTER — Inpatient Hospital Stay: Payer: BLUE CROSS/BLUE SHIELD | Admitting: Oncology

## 2018-08-14 NOTE — Progress Notes (Deleted)
[facility] OFFICE PROGRESS NOTE  [name], MD [place] [address]  DIAGNOSIS: recurrent non-small cell lung cancer initially diagnosed as Stage IIIB (T1b, N3, M0) non-small cell lung cancer favoring adenocarcinoma presented with left lower lobe pulmonary nodule in addition to left hilar and bilateral mediastinal lymphadenopathy in addition to right supraclavicular lymph nodes diagnosed in April 2017.  PRIOR THERAPY: 1) Concurrent chemoradiation with weekly carboplatin for AUC of 2 and paclitaxel 45 mg/M2 started on 11/15/2015. He is status post 6 cycles. Last dose was getting 12/20/2015 with partial response. 2) Consolidation chemotherapy with carboplatin for AUC of 5 and paclitaxel 175 MG/M2 every 3 weeks with Neulasta support. Status post 3cycles. Last dose was given 04/03/2016. 3) systemic chemotherapy with carboplatin for AUC of 5, Alimta 500 MG/M2 and Ketruda 200 MG IV every 3 weeks. First dose 05/16/2017.  Status post 20 cycle.  Starting from cycle #5 the patient is treated with maintenance Alimta and Keytruda only.  Last dose was given June 26, 2018. It was discontinued today secondary to disease progression.  CURRENT THERAPY: Systemic chemotherapy with docetaxel 75 mg/M2 and Cyramza 10 mg/KG every 3 weeks.  First dose July 24, 2018.  INTERVAL HISTORY: Brian White 68 y.o. male returns for *** regular *** visit for followup of ***   MEDICAL HISTORY: Past Medical History:  Diagnosis Date  . Adenocarcinoma, lung ([place]) 10/08/2015   2.4cm spiculated LLL nodule suspicious for primary bronchogenic carcinoma by CT 09/2015 - adenocarcinoma by endobronchial biopsy  . Allergy   . Arthritis   . CAD (coronary artery disease)    mild MI; pt unaware - seen on stress test  . COPD (chronic obstructive pulmonary disease) ([place]) 06/26/2011   Mild centrilobular and paraseptal emphysema by CT 09/2015   . ED (erectile dysfunction)   . H/O  anaphylactic shock 2013   hives,hypotension,syncope  . H/O seasonal allergies   . Herpes zoster 12/06/2015  . Hypertension   . Polycythemia 01/25/2014  . Prostate cancer [facility]) 2013   s/p radiation therapy March 2013, planned f/u with uro after XRT  . Radiation 09/18/2011 through 11/10/2011   Prostate 7600 cGy 40 sessions, seminal vesicles 5600 cGy 40 sessions    . Radiation 11/10/15-12/22/15   chest 60 Gy  . Shortness of breath dyspnea   . Smoker   . Thoracic aorta atherosclerosis ([place]) 10/08/2015   Mild by CT 09/2015     ALLERGIES:  is allergic to onion; cyramza [ramucirumab]; and taxotere [docetaxel].  MEDICATIONS:  Current Outpatient Medications  Medication Sig Dispense Refill  . acetaminophen (TYLENOL) 500 MG tablet Take 1,000 mg by mouth every 6 (six) hours as needed (for pain.).    Marland Kitchen amiodarone (PACERONE) 200 MG tablet Take 1 tablet (200 mg total) by mouth 2 (two) times daily for 30 days. 60 tablet 1  . apixaban (ELIQUIS) 5 MG TABS tablet Take 1 tablet (5 mg total) by mouth 2 (two) times daily. 60 tablet 5  . budesonide-formoterol (SYMBICORT) 160-4.5 MCG/ACT inhaler Inhale 1 puff into the lungs 2 (two) times daily. 3 Inhaler 3  . cetirizine (ZYRTEC) 10 MG chewable tablet Chew 10 mg by mouth daily as needed (for seasonal allergies).     Marland Kitchen dexamethasone (DECADRON) 4 MG tablet TAKE 2 TABLET BY MOUTH TWICE DAILY THE DAY BEFORE, DAY OF AND AFTER THE CHEMOTHERAPY EVERY 3 WEEKS 40 tablet 2  . folic acid (FOLVITE) 1 MG tablet TAKE 1 TABLET(1 MG) BY MOUTH DAILY 30 tablet  0  . furosemide (LASIX) 40 MG tablet Take 1 tablet (40 mg total) by mouth 2 (two) times daily for 30 days. 60 tablet 1  . guaiFENesin (MUCINEX) 600 MG 12 hr tablet Take 600 mg by mouth 2 (two) times daily as needed (for cough/congestion).     Marland Kitchen levalbuterol (XOPENEX HFA) 45 MCG/ACT inhaler Inhale 2 puffs into the lungs every 4 (four) hours as needed for wheezing. 1 Inhaler 2  . lidocaine-prilocaine (EMLA)  cream Apply 1 application topically as needed. Squeeze a  small amount of cream to cotton ball and apply to port site 1-2 hours prior to chemotherapy. Do not rub in the cream and cover  with plastic wrap. 30 g 0  . losartan (COZAAR) 25 MG tablet Take 1 tablet (25 mg total) by mouth daily. 30 tablet 5  . methylPREDNISolone (MEDROL DOSEPAK) 4 MG TBPK tablet 6 x 1 day, 5 x 1 day, 4 x 1 day, 3 x 1 day, 2 x 1 day, 1 x 1 day 21 tablet 0  . metoprolol tartrate (LOPRESSOR) 25 MG tablet Take 1 tablet (25 mg total) by mouth 2 (two) times daily for 30 days. 60 tablet 1  . mirtazapine (REMERON) 15 MG tablet TAKE 1 TABLET(15 MG) BY MOUTH AT BEDTIME (Patient taking differently: Take 15 mg by mouth at bedtime as needed (mood/sleep.). ) 30 tablet 0  . omeprazole (PRILOSEC) 20 MG capsule Take 1 capsule (20 mg total) by mouth daily. 30 capsule 1  . potassium chloride SA (K-DUR,KLOR-CON) 20 MEQ tablet Take 1 tablet (20 mEq total) by mouth daily for 30 days. 30 tablet 1  . prochlorperazine (COMPAZINE) 10 MG tablet Take 1 tablet (10 mg total) by mouth every 6 (six) hours as needed for nausea or vomiting. 30 tablet 0  . spironolactone (ALDACTONE) 25 MG tablet Take 0.5 tablets (12.5 mg total) by mouth daily. 30 tablet 1  . triamcinolone cream (KENALOG) 0.5 % Apply 1 application topically 3 (three) times daily. 60 g 0   No current facility-administered medications for this visit.     SURGICAL HISTORY:  Past Surgical History:  Procedure Laterality Date  . COLONOSCOPY W/ BIOPSIES AND POLYPECTOMY  10/11/15   mult TAs, 3cm rectal polypoid lesion; rec further treatment, diverticulosis ([place])  . ENDOBRONCHIAL ULTRASOUND Bilateral 10/18/2015   Procedure: ENDOBRONCHIAL ULTRASOUND;  Surgeon: Javier Glazier, MD;  Location: [facility];  Service: Cardiopulmonary;  Laterality: Bilateral;  . HERNIA REPAIR Right 2005   with mesh  . INSERTION PROSTATE RADIATION SEED  08/2011   prostate cancer, [facility]  . IR FLUORO GUIDE  PORT INSERTION RIGHT  07/26/2017  . IR US GUIDE VASC ACCESS RIGHT  07/26/2017  . LEFT HEART CATH AND CORONARY ANGIOGRAPHY N/A 08/05/2018   Procedure: LEFT HEART CATH AND CORONARY ANGIOGRAPHY;  Surgeon: Nelva Bush, MD;  Location: [facility] CV LAB;  Service: Cardiovascular;  Laterality: N/A;    REVIEW OF SYSTEMS:   Review of Systems  Constitutional: Negative for appetite change, chills, fatigue, fever and unexpected weight change.  HENT:   Negative for mouth sores, nosebleeds, sore throat and trouble swallowing.   Eyes: Negative for eye problems and icterus.  Respiratory: Negative for cough, hemoptysis, shortness of breath and wheezing.   Cardiovascular: Negative for chest pain and leg swelling.  Gastrointestinal: Negative for abdominal pain, constipation, diarrhea, nausea and vomiting.  Genitourinary: Negative for bladder incontinence, difficulty urinating, dysuria, frequency and hematuria.   Musculoskeletal: Negative for back pain, gait problem, neck pain and neck  stiffness.  Skin: Negative for itching and rash.  Neurological: Negative for dizziness, extremity weakness, gait problem, headaches, light-headedness and seizures.  Hematological: Negative for adenopathy. Does not bruise/bleed easily.  Psychiatric/Behavioral: Negative for confusion, depression and sleep disturbance. The patient is not nervous/anxious.     PHYSICAL EXAMINATION:  There were no vitals taken for this visit.  ECOG PERFORMANCE STATUS: 1 - Symptomatic but completely ambulatory  Physical Exam  Constitutional: Oriented to person, place, and time and well-developed, well-nourished, and in no distress. No distress.  HENT:  Head: Normocephalic and atraumatic.  Mouth/Throat: Oropharynx is clear and moist. No oropharyngeal exudate.  Eyes: Conjunctivae are normal. Right eye exhibits no discharge. Left eye exhibits no discharge. No scleral icterus.  Neck: Normal range of motion. Neck supple.  Cardiovascular: Normal  rate, regular rhythm, normal heart sounds and intact distal pulses.   Pulmonary/Chest: Effort normal and breath sounds normal. No respiratory distress. No wheezes. No rales.  Abdominal: Soft. Bowel sounds are normal. Exhibits no distension and no mass. There is no tenderness.  Musculoskeletal: Normal range of motion. Exhibits no edema.  Lymphadenopathy:    No cervical adenopathy.  Neurological: Alert and oriented to person, place, and time. Exhibits normal muscle tone. Gait normal. Coordination normal.  Skin: Skin is warm and dry. No rash noted. Not diaphoretic. No erythema. No pallor.  Psychiatric: Mood, memory and judgment normal.  Vitals reviewed.  LABORATORY DATA: Lab Results  Component Value Date   WBC 8.5 08/08/2018   HGB 9.3 (L) 08/08/2018   HCT 29.6 (L) 08/08/2018   MCV 106.5 (H) 08/08/2018   PLT 131 (L) 08/08/2018      Chemistry      Component Value Date/Time   NA 136 08/08/2018 0330   NA 138 07/18/2017 1000   K 4.1 08/08/2018 0330   K 4.0 07/18/2017 1000   CL 100 08/08/2018 0330   CO2 26 08/08/2018 0330   CO2 25 07/18/2017 1000   BUN 24 (H) 08/08/2018 0330   BUN 14.9 07/18/2017 1000   CREATININE 1.11 08/08/2018 0330   CREATININE 0.75 07/25/2018 0748   CREATININE 0.8 07/18/2017 1000      Component Value Date/Time   CALCIUM 8.7 (L) 08/08/2018 0330   CALCIUM 9.7 07/18/2017 1000   ALKPHOS 77 08/01/2018 0518   ALKPHOS 93 07/18/2017 1000   AST 19 08/01/2018 0518   AST 13 (L) 07/25/2018 0748   AST 22 07/18/2017 1000   ALT 33 08/01/2018 0518   ALT 10 07/25/2018 0748   ALT 24 07/18/2017 1000   BILITOT 1.2 08/01/2018 0518   BILITOT 0.4 07/25/2018 0748   BILITOT 0.54 07/18/2017 1000       RADIOGRAPHIC STUDIES:  Dg Chest 1 View  Result Date: 07/30/2018 CLINICAL DATA:  Shortness of breath EXAM: CHEST  1 VIEW COMPARISON:  Two days ago FINDINGS: Chronic cardiomegaly. Porta catheter on the right with tip the level of the right brachiocephalic. There is no edema,  consolidation, effusion, or pneumothorax. IMPRESSION: No evidence of active disease. Electronically Signed   By: Monte Fantasia M.D.   On: 07/30/2018 04:22   Dg Chest 2 View  Result Date: 08/02/2018 CLINICAL DATA:  Shortness of breath following CPR several days ago EXAM: CHEST - 2 VIEW COMPARISON:  07/30/2018 FINDINGS: Cardiac shadow is stable at the upper limits of normal in size. Right chest wall port is noted. The lungs are well aerated without focal infiltrate. No acute rib fractures are seen. No pneumothorax is noted. Pericardial calcifications are  noted posteriorly. IMPRESSION: No acute abnormality noted. Electronically Signed   By: Inez Catalina M.D.   On: 08/02/2018 15:01   Ct Chest W Contrast  Result Date: 07/18/2018 CLINICAL DATA:  Patient with history of non-small cell lung cancer status post chemotherapy and radiation. EXAM: CT CHEST, ABDOMEN, AND PELVIS WITH CONTRAST TECHNIQUE: Multidetector CT imaging of the chest, abdomen and pelvis was performed following the standard protocol during bolus administration of intravenous contrast. CONTRAST:  119mL OMNIPAQUE IOHEXOL 300 MG/ML  SOLN COMPARISON:  CT CAP 04/22/2018 FINDINGS: CT CHEST FINDINGS Cardiovascular: Heart is enlarged. New small pericardial effusion. Coronary arterial vascular calcifications. Thoracic aortic vascular calcifications. Mediastinum/Nodes: Interval increase in size of soft tissue within the AP window measuring 3.5 x 3.9 cm (image 23; series 2), previously 2.9 x 2.4 cm. Increase in left infrahilar adenopathy measuring 1.5 cm (image 34; series 2), previously 0.7 cm. Increased left infrahilar and left hilar soft tissue fullness. Additionally, there is new vague soft tissue fullness within the right hilum (image 21; series 2). Lungs/Pleura: Marked narrowing of the left mainstem bronchus (image 63; series 4). Additionally there is narrowing of the lingular and left lower lobe bronchi. Interval increase in nodularity within the medial  left lower lobe at the site of associated scarring with nodularity measuring up to 1.1 cm (image 90; series 4), previously 0.7 cm. Unchanged 6 mm low-attenuation nodule left upper lobe (image 33; series 4). New small left layering pleural effusion. No pneumothorax. Patchy ground-glass and consolidative opacities within the medial right upper lobe (image 54; series 4), new from prior. Musculoskeletal: Thoracic spine degenerative changes. No aggressive or acute appearing osseous lesions. CT ABDOMEN PELVIS FINDINGS Hepatobiliary: The liver is normal in size and contour. Fatty deposition adjacent to the falciform ligament. Gallbladder is decompressed. No intrahepatic or extrahepatic biliary ductal dilatation. Pancreas: Unremarkable Spleen: Unremarkable Adrenals/Urinary Tract: Normal adrenal glands. Bilateral perinephric fat stranding. Urinary bladder wall thickening. No after fro CIS. Stomach/Bowel: No abnormal bowel wall thickening or evidence for bowel obstruction. No free fluid or free intraperitoneal air. No are of the stomach. Vascular/Lymphatic: Normal caliber abdominal aorta. Interval increase in size of aortocaval lymph node measuring 2.4 x 1.8 cm (image 70; series 2), previously 2.2 x 1.6 cm. 1.4 cm preaortic lymph node (image 75; series 2), unchanged from prior. Interval increase in size of gastrohepatic node measuring 2.0 cm (image 55; series 2), previously 1.4 cm. Reproductive: Heterogeneous enlarged prostate. Other: Small bilateral fat containing inguinal hernias. Musculoskeletal: Lumbar spine degenerative changes. No aggressive or acute appearing osseous lesions. IMPRESSION: 1. Interval increase in size of nodal mass within the AP window as well as soft tissue extending into the left hilum. There is associated narrowing of the left lung bronchi. 2. Increased nodularity within the left lower lobe which may represent disease progression. New small left pleural effusion. 3. Interval increase in size of a few  of the retroperitoneal lymph nodes and gastrohepatic node within the abdomen. 4. New ground-glass and consolidative opacities within the medial right upper lobe which may be infectious/inflammatory in etiology. Recommend attention on follow-up. Electronically Signed   By: Lovey Newcomer M.D.   On: 07/18/2018 13:05   Ct Abdomen Pelvis W Contrast  Result Date: 07/18/2018 CLINICAL DATA:  Patient with history of non-small cell lung cancer status post chemotherapy and radiation. EXAM: CT CHEST, ABDOMEN, AND PELVIS WITH CONTRAST TECHNIQUE: Multidetector CT imaging of the chest, abdomen and pelvis was performed following the standard protocol during bolus administration of intravenous contrast. CONTRAST:  162mL OMNIPAQUE IOHEXOL 300 MG/ML  SOLN COMPARISON:  CT CAP 04/22/2018 FINDINGS: CT CHEST FINDINGS Cardiovascular: Heart is enlarged. New small pericardial effusion. Coronary arterial vascular calcifications. Thoracic aortic vascular calcifications. Mediastinum/Nodes: Interval increase in size of soft tissue within the AP window measuring 3.5 x 3.9 cm (image 23; series 2), previously 2.9 x 2.4 cm. Increase in left infrahilar adenopathy measuring 1.5 cm (image 34; series 2), previously 0.7 cm. Increased left infrahilar and left hilar soft tissue fullness. Additionally, there is new vague soft tissue fullness within the right hilum (image 21; series 2). Lungs/Pleura: Marked narrowing of the left mainstem bronchus (image 63; series 4). Additionally there is narrowing of the lingular and left lower lobe bronchi. Interval increase in nodularity within the medial left lower lobe at the site of associated scarring with nodularity measuring up to 1.1 cm (image 90; series 4), previously 0.7 cm. Unchanged 6 mm low-attenuation nodule left upper lobe (image 33; series 4). New small left layering pleural effusion. No pneumothorax. Patchy ground-glass and consolidative opacities within the medial right upper lobe (image 54; series 4),  new from prior. Musculoskeletal: Thoracic spine degenerative changes. No aggressive or acute appearing osseous lesions. CT ABDOMEN PELVIS FINDINGS Hepatobiliary: The liver is normal in size and contour. Fatty deposition adjacent to the falciform ligament. Gallbladder is decompressed. No intrahepatic or extrahepatic biliary ductal dilatation. Pancreas: Unremarkable Spleen: Unremarkable Adrenals/Urinary Tract: Normal adrenal glands. Bilateral perinephric fat stranding. Urinary bladder wall thickening. No after fro CIS. Stomach/Bowel: No abnormal bowel wall thickening or evidence for bowel obstruction. No free fluid or free intraperitoneal air. No are of the stomach. Vascular/Lymphatic: Normal caliber abdominal aorta. Interval increase in size of aortocaval lymph node measuring 2.4 x 1.8 cm (image 70; series 2), previously 2.2 x 1.6 cm. 1.4 cm preaortic lymph node (image 75; series 2), unchanged from prior. Interval increase in size of gastrohepatic node measuring 2.0 cm (image 55; series 2), previously 1.4 cm. Reproductive: Heterogeneous enlarged prostate. Other: Small bilateral fat containing inguinal hernias. Musculoskeletal: Lumbar spine degenerative changes. No aggressive or acute appearing osseous lesions. IMPRESSION: 1. Interval increase in size of nodal mass within the AP window as well as soft tissue extending into the left hilum. There is associated narrowing of the left lung bronchi. 2. Increased nodularity within the left lower lobe which may represent disease progression. New small left pleural effusion. 3. Interval increase in size of a few of the retroperitoneal lymph nodes and gastrohepatic node within the abdomen. 4. New ground-glass and consolidative opacities within the medial right upper lobe which may be infectious/inflammatory in etiology. Recommend attention on follow-up. Electronically Signed   By: Lovey Newcomer M.D.   On: 07/18/2018 13:05   Dg Chest Port 1 View  Result Date:  08/06/2018 CLINICAL DATA:  Tachycardia EXAM: PORTABLE CHEST 1 VIEW COMPARISON:  08/02/2018 and prior radiographs FINDINGS: Cardiomegaly noted. RIGHT Port-A-Cath is again identified with tip overlying the UPPER SVC. Mild peribronchial thickening and LEFT basilar scarring again noted. There is no evidence of focal airspace disease, pulmonary edema, suspicious pulmonary nodule/mass, pleural effusion, or pneumothorax. No acute bony abnormalities are identified. IMPRESSION: Cardiomegaly without evidence of acute cardiopulmonary disease. Electronically Signed   By: Margarette Canada M.D.   On: 08/06/2018 13:12   Dg Chest Port 1 View  Result Date: 07/28/2018 CLINICAL DATA:  Shortness of breath. EXAM: PORTABLE CHEST 1 VIEW COMPARISON:  Radiographs of May 01, 2018. FINDINGS: Stable cardiomegaly. Right internal jugular Port-A-Cath is unchanged in position. No pneumothorax or pleural  effusion is noted. Bony thorax is unremarkable. IMPRESSION: No acute cardiopulmonary abnormality seen. Electronically Signed   By: Marijo Conception, M.D.   On: 07/28/2018 12:22     ASSESSMENT/PLAN:  No problem-specific Assessment & Plan notes found for this encounter.   No orders of the defined types were placed in this encounter.    Mikey Bussing, DNP, AGPCNP-BC, AOCNP 08/14/18

## 2018-08-15 ENCOUNTER — Other Ambulatory Visit (HOSPITAL_COMMUNITY): Payer: BLUE CROSS/BLUE SHIELD

## 2018-08-16 ENCOUNTER — Other Ambulatory Visit: Payer: Self-pay | Admitting: Internal Medicine

## 2018-08-16 ENCOUNTER — Other Ambulatory Visit: Payer: Self-pay | Admitting: Nurse Practitioner

## 2018-08-16 DIAGNOSIS — C3432 Malignant neoplasm of lower lobe, left bronchus or lung: Secondary | ICD-10-CM

## 2018-08-16 DIAGNOSIS — R5382 Chronic fatigue, unspecified: Secondary | ICD-10-CM

## 2018-08-16 DIAGNOSIS — Z7189 Other specified counseling: Secondary | ICD-10-CM

## 2018-08-16 DIAGNOSIS — Z23 Encounter for immunization: Secondary | ICD-10-CM

## 2018-08-16 DIAGNOSIS — Z5112 Encounter for antineoplastic immunotherapy: Secondary | ICD-10-CM

## 2018-08-16 NOTE — Telephone Encounter (Signed)
Next scheduled F/U 09-04-2018. Reported to pharmacy uses mirtazapine at bedtime as needed for mood or sleep.

## 2018-08-20 ENCOUNTER — Ambulatory Visit: Payer: Medicare Other | Admitting: Gastroenterology

## 2018-08-21 ENCOUNTER — Encounter: Payer: Self-pay | Admitting: Family Medicine

## 2018-08-21 ENCOUNTER — Ambulatory Visit (INDEPENDENT_AMBULATORY_CARE_PROVIDER_SITE_OTHER): Payer: BLUE CROSS/BLUE SHIELD | Admitting: Family Medicine

## 2018-08-21 ENCOUNTER — Other Ambulatory Visit: Payer: Medicare Other

## 2018-08-21 VITALS — BP 122/64 | HR 55 | Temp 97.8°F | Ht 69.5 in | Wt 183.2 lb

## 2018-08-21 DIAGNOSIS — I480 Paroxysmal atrial fibrillation: Secondary | ICD-10-CM

## 2018-08-21 DIAGNOSIS — I472 Ventricular tachycardia, unspecified: Secondary | ICD-10-CM

## 2018-08-21 DIAGNOSIS — C3432 Malignant neoplasm of lower lobe, left bronchus or lung: Secondary | ICD-10-CM

## 2018-08-21 DIAGNOSIS — I5043 Acute on chronic combined systolic (congestive) and diastolic (congestive) heart failure: Secondary | ICD-10-CM | POA: Diagnosis not present

## 2018-08-21 DIAGNOSIS — C3492 Malignant neoplasm of unspecified part of left bronchus or lung: Secondary | ICD-10-CM | POA: Diagnosis not present

## 2018-08-21 DIAGNOSIS — D61810 Antineoplastic chemotherapy induced pancytopenia: Secondary | ICD-10-CM | POA: Diagnosis not present

## 2018-08-21 DIAGNOSIS — I2510 Atherosclerotic heart disease of native coronary artery without angina pectoris: Secondary | ICD-10-CM | POA: Diagnosis not present

## 2018-08-21 LAB — CBC WITH DIFFERENTIAL/PLATELET
Basophils Absolute: 0.1 10*3/uL (ref 0.0–0.1)
Basophils Relative: 0.7 % (ref 0.0–3.0)
Eosinophils Absolute: 0.2 10*3/uL (ref 0.0–0.7)
Eosinophils Relative: 2.2 % (ref 0.0–5.0)
HCT: 33.5 % — ABNORMAL LOW (ref 39.0–52.0)
Hemoglobin: 11 g/dL — ABNORMAL LOW (ref 13.0–17.0)
Lymphocytes Relative: 14.3 % (ref 12.0–46.0)
Lymphs Abs: 1.2 10*3/uL (ref 0.7–4.0)
MCHC: 32.9 g/dL (ref 30.0–36.0)
MCV: 99.5 fl (ref 78.0–100.0)
Monocytes Absolute: 1.1 10*3/uL — ABNORMAL HIGH (ref 0.1–1.0)
Monocytes Relative: 12.8 % — ABNORMAL HIGH (ref 3.0–12.0)
Neutro Abs: 5.7 10*3/uL (ref 1.4–7.7)
Neutrophils Relative %: 70 % (ref 43.0–77.0)
Platelets: 231 10*3/uL (ref 150.0–400.0)
RBC: 3.37 Mil/uL — ABNORMAL LOW (ref 4.22–5.81)
RDW: 17 % — ABNORMAL HIGH (ref 11.5–15.5)
WBC: 8.2 10*3/uL (ref 4.0–10.5)

## 2018-08-21 LAB — RENAL FUNCTION PANEL
Albumin: 3.7 g/dL (ref 3.5–5.2)
BUN: 21 mg/dL (ref 6–23)
CO2: 28 mEq/L (ref 19–32)
Calcium: 9.6 mg/dL (ref 8.4–10.5)
Chloride: 100 mEq/L (ref 96–112)
Creatinine, Ser: 1.06 mg/dL (ref 0.40–1.50)
GFR: 69.77 mL/min (ref >60.00)
Glucose, Bld: 99 mg/dL (ref 70–99)
Phosphorus: 3.6 mg/dL (ref 2.3–4.6)
Potassium: 4.9 mEq/L (ref 3.5–5.1)
Sodium: 138 mEq/L (ref 135–145)

## 2018-08-21 NOTE — Assessment & Plan Note (Addendum)
Continue onc f/u. Chemo currently on hold.

## 2018-08-21 NOTE — Assessment & Plan Note (Signed)
Now monitoring daily weights, following heart healthy low sodium diet. On lasix 40mg  BID with potassium 42mEq daily. Update labs.

## 2018-08-21 NOTE — Assessment & Plan Note (Signed)
Had catheterization during recent hospitalization.

## 2018-08-21 NOTE — Progress Notes (Signed)
BP 122/64 (BP Location: Left Arm, Patient Position: Sitting, Cuff Size: Normal)   Pulse (!) 55   Temp 97.8 F (36.6 C) (Oral)   Ht 5' 9.5" (1.765 m)   Wt 183 lb 4 oz (83.1 kg)   SpO2 100%   BMI 26.67 kg/m    CC: hosp f/u visit Subjective:    Patient ID: Brian White, male    DOB: 06/23/1951, 68 y.o.   MRN: [id number]  HPI: Brian White is a 68 y.o. male presenting on 08/21/2018 for Hospitalization Follow-up (Pt accomapanied by his wife, [name]. )   Recent hospitalization for parox afib/flutter with RVR - treated with dilt drip, complicated by VT arrest requiring 1 shock. Started on amiodarone. Carvedilol was changed to metoprolol. Planned cards f/u (Camnitz). Acute on chronic sCHF with EF 20-25% treated with IV diuretics. Cardiac catheterization was also completed.   Since home, overall feeling well. No further tachycardia. No dyspnea, chest pain, leg swelling. Checking weights at home - running 184lb this morning. Following heart healthy low sodium diet - although he doesn't like this.   Wife would like to apply for FMLA to help care for husband. Requests we fill this out.   Admit date: 07/28/2018 Discharge date: 08/08/2018 TCM hosp f/u phone call completed 08/12/2018  Admitted From: Home Disposition:  Home  Recommendations for Outpatient Follow-up:  1. Follow up with PCP in 1-2 weeks 2. Please obtain BMP/CBC in one week 3. Please weigh yourself daily and eat a low-salt diet.  Home Health: No Equipment/Devices:none  Discharge Condition: stable CODE STATUS: FULL Diet recommendation: Heart Healthy   Discharge Diagnoses:  Principal Problem:   Atrial fibrillation with RVR ([place]) Active Problems:   HTN (hypertension)   COPD (chronic obstructive pulmonary disease) (HCC)   Smoker   Cancer of lower lobe of left lung (HCC)   Anticoagulated   PAF (paroxysmal atrial fibrillation) (HCC)   Acute systolic heart failure (HCC)   Ventricular tachycardia (HCC)   Primary malignant  neoplasm of lung metastatic to other site [facility])   Acute on chronic combined systolic and diastolic CHF (congestive heart failure) (HCC)     Relevant past medical, surgical, family and social history reviewed and updated as indicated. Interim medical history since our last visit reviewed. Allergies and medications reviewed and updated. Discharge medications reconciled with current home medications.  Outpatient Medications Prior to Visit  Medication Sig Dispense Refill  . acetaminophen (TYLENOL) 500 MG tablet Take 1,000 mg by mouth every 6 (six) hours as needed (for pain.).    Marland Kitchen amiodarone (PACERONE) 200 MG tablet Take 1 tablet (200 mg total) by mouth 2 (two) times daily for 30 days. 60 tablet 1  . apixaban (ELIQUIS) 5 MG TABS tablet Take 1 tablet (5 mg total) by mouth 2 (two) times daily. 60 tablet 5  . budesonide-formoterol (SYMBICORT) 160-4.5 MCG/ACT inhaler Inhale 1 puff into the lungs 2 (two) times daily. 3 Inhaler 3  . cetirizine (ZYRTEC) 10 MG chewable tablet Chew 10 mg by mouth daily as needed (for seasonal allergies).     Marland Kitchen dexamethasone (DECADRON) 4 MG tablet TAKE 2 TABLET BY MOUTH TWICE DAILY THE DAY BEFORE, DAY OF AND AFTER THE CHEMOTHERAPY EVERY 3 WEEKS 40 tablet 2  . folic acid (FOLVITE) 1 MG tablet TAKE 1 TABLET(1 MG) BY MOUTH DAILY 30 tablet 0  . furosemide (LASIX) 40 MG tablet Take 1 tablet (40 mg total) by mouth 2 (two) times daily for 30 days. 60 tablet 1  . guaiFENesin (  MUCINEX) 600 MG 12 hr tablet Take 600 mg by mouth 2 (two) times daily as needed (for cough/congestion).     Marland Kitchen levalbuterol (XOPENEX HFA) 45 MCG/ACT inhaler Inhale 2 puffs into the lungs every 4 (four) hours as needed for wheezing. 1 Inhaler 2  . lidocaine-prilocaine (EMLA) cream Apply 1 application topically as needed. Squeeze a  small amount of cream to cotton ball and apply to port site 1-2 hours prior to chemotherapy. Do not rub in the cream and cover  with plastic wrap. 30 g 0  . losartan (COZAAR) 25 MG  tablet Take 1 tablet (25 mg total) by mouth daily. 30 tablet 5  . metoprolol tartrate (LOPRESSOR) 25 MG tablet Take 1 tablet (25 mg total) by mouth 2 (two) times daily for 30 days. 60 tablet 1  . mirtazapine (REMERON) 15 MG tablet TAKE 1 TABLET(15 MG) BY MOUTH AT BEDTIME 30 tablet 0  . omeprazole (PRILOSEC) 20 MG capsule Take 1 capsule (20 mg total) by mouth daily. 30 capsule 1  . potassium chloride SA (K-DUR,KLOR-CON) 20 MEQ tablet Take 1 tablet (20 mEq total) by mouth daily for 30 days. 30 tablet 1  . prochlorperazine (COMPAZINE) 10 MG tablet Take 1 tablet (10 mg total) by mouth every 6 (six) hours as needed for nausea or vomiting. 30 tablet 0  . spironolactone (ALDACTONE) 25 MG tablet Take 0.5 tablets (12.5 mg total) by mouth daily. 30 tablet 1  . triamcinolone cream (KENALOG) 0.5 % Apply 1 application topically 3 (three) times daily. 60 g 0  . methylPREDNISolone (MEDROL DOSEPAK) 4 MG TBPK tablet 6 x 1 day, 5 x 1 day, 4 x 1 day, 3 x 1 day, 2 x 1 day, 1 x 1 day 21 tablet 0   No facility-administered medications prior to visit.      Per HPI unless specifically indicated in ROS section below Review of Systems Objective:    BP 122/64 (BP Location: Left Arm, Patient Position: Sitting, Cuff Size: Normal)   Pulse (!) 55   Temp 97.8 F (36.6 C) (Oral)   Ht 5' 9.5" (1.765 m)   Wt 183 lb 4 oz (83.1 kg)   SpO2 100%   BMI 26.67 kg/m   Wt Readings from Last 3 Encounters:  08/21/18 183 lb 4 oz (83.1 kg)  08/08/18 187 lb 4.8 oz (85 kg)  07/19/18 202 lb 8 oz (91.9 kg)    Physical Exam Vitals signs and nursing note reviewed.  Constitutional:      Appearance: Normal appearance.  HENT:     Mouth/Throat:     Mouth: Mucous membranes are moist.     Pharynx: Oropharynx is clear.  Eyes:     Extraocular Movements: Extraocular movements intact.     Pupils: Pupils are equal, round, and reactive to light.  Cardiovascular:     Rate and Rhythm: Normal rate and regular rhythm.     Pulses: Normal  pulses.     Heart sounds: Normal heart sounds. No murmur.     Comments: Distant heart sounds Pulmonary:     Effort: Pulmonary effort is normal. No respiratory distress.     Breath sounds: Normal breath sounds. No wheezing, rhonchi or rales.  Musculoskeletal:     Right lower leg: No edema.     Left lower leg: No edema.  Skin:    Findings: No rash.  Neurological:     Mental Status: He is alert.  Psychiatric:        Mood and Affect:  Mood normal.       Lab Results  Component Value Date   WBC 8.5 08/08/2018   HGB 9.3 (L) 08/08/2018   HCT 29.6 (L) 08/08/2018   MCV 106.5 (H) 08/08/2018   PLT 131 (L) 08/08/2018    Lab Results  Component Value Date   CREATININE 1.11 08/08/2018   BUN 24 (H) 08/08/2018   NA 136 08/08/2018   K 4.1 08/08/2018   CL 100 08/08/2018   CO2 26 08/08/2018    Assessment & Plan:   Problem List Items Addressed This Visit    Ventricular tachycardia ([place])    Isolated VT arrest s/p shock. Now on amiodarone.       Primary malignant neoplasm of lung metastatic to other site [facility]) (Chronic)   Pancytopenia due to chemotherapy [facility])    Update labs today.       PAF (paroxysmal atrial fibrillation) (HCC) - Primary (Chronic)    New - now on amiodarone. Has f/u scheduled with cards and EP, appreciate their care. Overall stable since discharged.       Relevant Orders   CBC with Differential/Platelet   Renal function panel   Cancer of lower lobe of left lung (HCC) (Chronic)    Continue onc f/u. Chemo currently on hold.       CAD (coronary artery disease)    Had catheterization during recent hospitalization.       Acute on chronic combined systolic and diastolic CHF (congestive heart failure) ([place])    Now monitoring daily weights, following heart healthy low sodium diet. On lasix 40mg  BID with potassium 64mEq daily. Update labs.           No orders of the defined types were placed in this encounter.  Orders Placed This Encounter  Procedures  . CBC  with Differential/Platelet  . Renal function panel    Follow up plan: Return if symptoms worsen or fail to improve.  [name], MD

## 2018-08-21 NOTE — Assessment & Plan Note (Signed)
New - now on amiodarone. Has f/u scheduled with cards and EP, appreciate their care. Overall stable since discharged.

## 2018-08-21 NOTE — Patient Instructions (Addendum)
You are doing well today Labs today Return as needed or in April for wellness visit

## 2018-08-21 NOTE — Assessment & Plan Note (Signed)
Isolated VT arrest s/p shock. Now on amiodarone.

## 2018-08-21 NOTE — Assessment & Plan Note (Signed)
Update labs today 

## 2018-08-23 ENCOUNTER — Ambulatory Visit (INDEPENDENT_AMBULATORY_CARE_PROVIDER_SITE_OTHER): Payer: BLUE CROSS/BLUE SHIELD | Admitting: Cardiology

## 2018-08-23 ENCOUNTER — Encounter: Payer: Self-pay | Admitting: Cardiology

## 2018-08-23 VITALS — BP 110/62 | HR 47 | Ht 71.0 in | Wt 180.8 lb

## 2018-08-23 DIAGNOSIS — I5022 Chronic systolic (congestive) heart failure: Secondary | ICD-10-CM

## 2018-08-23 DIAGNOSIS — I428 Other cardiomyopathies: Secondary | ICD-10-CM

## 2018-08-23 DIAGNOSIS — I483 Typical atrial flutter: Secondary | ICD-10-CM | POA: Diagnosis not present

## 2018-08-23 DIAGNOSIS — I2510 Atherosclerotic heart disease of native coronary artery without angina pectoris: Secondary | ICD-10-CM

## 2018-08-23 MED ORDER — AMIODARONE HCL 200 MG PO TABS: 200.0000 mg | ORAL_TABLET | Freq: Every day | ORAL | 1 refills | Status: DC

## 2018-08-23 MED ORDER — METOPROLOL SUCCINATE ER 25 MG PO TB24: 25.0000 mg | ORAL_TABLET | Freq: Every day | ORAL | 3 refills | Status: DC

## 2018-08-23 NOTE — Patient Instructions (Signed)
Medication Instructions:  DECREASE AMIODARONE TO 200 MG ONCE DAILY  STOP METOPROLOL TARTRATE WHEN CURRENT BOTTLE EMPTY THEN START METOPROLOL SUCC ER 25 MG ONCE DAILY AT BEDTIME If you need a refill on your cardiac medications before your next appointment, please call your pharmacy.   Lab work: Your physician recommends that you return for lab work in: [place] If you have labs (blood work) drawn today and your tests are completely normal, you will receive your results only by: Marland Kitchen MyChart Message (if you have MyChart) OR . A paper copy in the mail If you have any lab test that is abnormal or we need to change your treatment, we will call you to review the results.  Follow-Up: At [facility], you and your health needs are our priority.  As part of our continuing mission to provide you with exceptional heart care, we have created designated Provider Care Teams.  These Care Teams include your primary Cardiologist (physician) and Advanced Practice Providers (APPs -  Physician Assistants and Nurse Practitioners) who all work together to provide you with the care you need, when you need it. Your physician recommends that you schedule a follow-up appointment in: [place]

## 2018-08-28 ENCOUNTER — Other Ambulatory Visit: Payer: Medicare Other

## 2018-08-28 ENCOUNTER — Ambulatory Visit: Payer: BLUE CROSS/BLUE SHIELD

## 2018-08-28 ENCOUNTER — Other Ambulatory Visit: Payer: BLUE CROSS/BLUE SHIELD

## 2018-08-28 ENCOUNTER — Ambulatory Visit: Payer: BLUE CROSS/BLUE SHIELD | Admitting: Internal Medicine

## 2018-08-30 ENCOUNTER — Ambulatory Visit (INDEPENDENT_AMBULATORY_CARE_PROVIDER_SITE_OTHER): Payer: BLUE CROSS/BLUE SHIELD | Admitting: Cardiology

## 2018-08-30 ENCOUNTER — Encounter: Payer: Self-pay | Admitting: Cardiology

## 2018-08-30 VITALS — BP 142/80 | HR 61 | Ht 71.0 in | Wt 181.6 lb

## 2018-08-30 DIAGNOSIS — I2510 Atherosclerotic heart disease of native coronary artery without angina pectoris: Secondary | ICD-10-CM

## 2018-08-30 DIAGNOSIS — I5022 Chronic systolic (congestive) heart failure: Secondary | ICD-10-CM | POA: Diagnosis not present

## 2018-08-30 NOTE — Progress Notes (Signed)
Electrophysiology Office Note   Date:  08/30/2018   ID:  Brian White, DOB 1951-04-07, MRN [id number]  PCP:  [name], MD  Cardiologist:  [name] Primary Electrophysiologist:  [name], MD    No chief complaint on file.    History of Present Illness: Brian White is a 68 y.o. male who is being seen today for the evaluation of atrial flutter at the request of Brian White. Presenting today for electrophysiology evaluation.  He has a history of lung adenocarcinoma, coronary artery disease, COPD, hypertension deep.  He is currently undergoing chemotherapy for stage IIIb lung cancer.  He saw his family doctor 05/01/2018 was noted to be tachycardic with heart rates into the 140s.  Blood he was found to be in atrial flutter.  Echocardiogram showed significant LV dysfunction.  He was started on Eliquis.  In clinic follow-up, he was noted to be back in sinus rhythm.  Today, denies symptoms of palpitations, chest pain, lower extremity edema, claudication, dizziness, presyncope, syncope, bleeding, or neurologic sequela. The patient is tolerating medications without difficulties.  January, he was hospitalized for chronic systolic heart failure.  He had a ventricular tachycardia arrest.  Left heart catheterization showed OM and circumflex disease, but no intervention was performed.  He was put on amiodarone.  A defibrillator was not offered due to his lung cancer and difficulty of therapy.  He has an appointment with his oncologist to further determine treatment goals.  Past Medical History:  Diagnosis Date  . Adenocarcinoma, lung ([place]) 10/08/2015   2.4cm spiculated LLL nodule suspicious for primary bronchogenic carcinoma by CT 09/2015 - adenocarcinoma by endobronchial biopsy  . Allergy   . Arthritis   . CAD (coronary artery disease)    mild MI; pt unaware - seen on stress test  . COPD (chronic obstructive pulmonary disease) ([place]) 06/26/2011   Mild centrilobular and paraseptal  emphysema by CT 09/2015   . ED (erectile dysfunction)   . H/O anaphylactic shock 2013   hives,hypotension,syncope  . H/O seasonal allergies   . Herpes zoster 12/06/2015  . Hypertension   . Polycythemia 01/25/2014  . Prostate cancer [facility]) 2013   s/p radiation therapy March 2013, planned f/u with uro after XRT  . Radiation 09/18/2011 through 11/10/2011   Prostate 7600 cGy 40 sessions, seminal vesicles 5600 cGy 40 sessions    . Radiation 11/10/15-12/22/15   chest 60 Gy  . Shortness of breath dyspnea   . Smoker   . Thoracic aorta atherosclerosis ([place]) 10/08/2015   Mild by CT 09/2015    Past Surgical History:  Procedure Laterality Date  . COLONOSCOPY W/ BIOPSIES AND POLYPECTOMY  10/11/15   mult TAs, 3cm rectal polypoid lesion; rec further treatment, diverticulosis ([place])  . ENDOBRONCHIAL ULTRASOUND Bilateral 10/18/2015   Procedure: ENDOBRONCHIAL ULTRASOUND;  Surgeon: Brian Glazier, MD;  Location: [facility] ENDOSCOPY;  Service: Cardiopulmonary;  Laterality: Bilateral;  . HERNIA REPAIR Right 2005   with mesh  . INSERTION PROSTATE RADIATION SEED  08/2011   prostate cancer, [place]  . IR FLUORO GUIDE PORT INSERTION RIGHT  07/26/2017  . IR US GUIDE VASC ACCESS RIGHT  07/26/2017  . LEFT HEART CATH AND CORONARY ANGIOGRAPHY N/A 08/05/2018   Procedure: LEFT HEART CATH AND CORONARY ANGIOGRAPHY;  Surgeon: Brian Bush, MD;  Location: [facility];  Service: Cardiovascular;  Laterality: N/A;     Current Outpatient Medications  Medication Sig Dispense Refill  . acetaminophen (TYLENOL) 500 MG tablet Take 1,000 mg by mouth every 6 (six)  hours as needed (for pain.).    Marland Kitchen amiodarone (PACERONE) 200 MG tablet Take 1 tablet (200 mg total) by mouth daily for 30 days. 60 tablet 1  . apixaban (ELIQUIS) 5 MG TABS tablet Take 1 tablet (5 mg total) by mouth 2 (two) times daily. 60 tablet 5  . budesonide-formoterol (SYMBICORT) 160-4.5 MCG/ACT inhaler Inhale 1 puff into the lungs 2 (two)  times daily. 3 Inhaler 3  . cetirizine (ZYRTEC) 10 MG chewable tablet Chew 10 mg by mouth daily as needed (for seasonal allergies).     Marland Kitchen dexamethasone (DECADRON) 4 MG tablet TAKE 2 TABLET BY MOUTH TWICE DAILY THE DAY BEFORE, DAY OF AND AFTER THE CHEMOTHERAPY EVERY 3 WEEKS 40 tablet 2  . folic acid (FOLVITE) 1 MG tablet TAKE 1 TABLET(1 MG) BY MOUTH DAILY 30 tablet 0  . furosemide (LASIX) 40 MG tablet Take 1 tablet (40 mg total) by mouth 2 (two) times daily for 30 days. 60 tablet 1  . guaiFENesin (MUCINEX) 600 MG 12 hr tablet Take 600 mg by mouth 2 (two) times daily as needed (for cough/congestion).     Marland Kitchen levalbuterol (XOPENEX HFA) 45 MCG/ACT inhaler Inhale 2 puffs into the lungs every 4 (four) hours as needed for wheezing. 1 Inhaler 2  . lidocaine-prilocaine (EMLA) cream Apply 1 application topically as needed. Squeeze a  small amount of cream to cotton ball and apply to port site 1-2 hours prior to chemotherapy. Do not rub in the cream and cover  with plastic wrap. 30 g 0  . losartan (COZAAR) 25 MG tablet Take 1 tablet (25 mg total) by mouth daily. 30 tablet 5  . metoprolol succinate (TOPROL XL) 25 MG 24 hr tablet Take 1 tablet (25 mg total) by mouth daily. 90 tablet 3  . mirtazapine (REMERON) 15 MG tablet TAKE 1 TABLET(15 MG) BY MOUTH AT BEDTIME 30 tablet 0  . omeprazole (PRILOSEC) 20 MG capsule Take 1 capsule (20 mg total) by mouth daily. 30 capsule 1  . potassium chloride SA (K-DUR,KLOR-CON) 20 MEQ tablet Take 1 tablet (20 mEq total) by mouth daily for 30 days. 30 tablet 1  . prochlorperazine (COMPAZINE) 10 MG tablet Take 1 tablet (10 mg total) by mouth every 6 (six) hours as needed for nausea or vomiting. 30 tablet 0  . spironolactone (ALDACTONE) 25 MG tablet Take 0.5 tablets (12.5 mg total) by mouth daily. 30 tablet 1  . triamcinolone cream (KENALOG) 0.5 % Apply 1 application topically 3 (three) times daily. 60 g 0   No current facility-administered medications for this visit.      Allergies:   Onion; Cyramza [ramucirumab]; and Taxotere [docetaxel]   Social History:  The patient  reports that he has been smoking cigarettes. He started smoking about 51 years ago. He has a 36.18 pack-year smoking history. He has never used smokeless tobacco. He reports current alcohol use of about 1.0 standard drinks of alcohol per week. He reports current drug use. Drug: Marijuana.   Family History:  The patient's family history includes Asthma in his father; Atrial fibrillation in his sister; Breast cancer in his sister; CAD in his father; COPD in his father; Emphysema in his father; Hypertension in his mother.    ROS:  Please see the history of present illness.   Otherwise, review of systems is positive for none.   All other systems are reviewed and negative.   PHYSICAL EXAM: VS:  BP (!) 142/80   Pulse 61   Ht 5\' 11"  (1.803  m)   Wt 181 lb 9.6 oz (82.4 kg)   SpO2 100%   BMI 25.33 kg/m  , BMI Body mass index is 25.33 kg/m. GEN: Well nourished, well developed, in no acute distress  HEENT: normal  Neck: no JVD, carotid bruits, or masses Cardiac: RRR; no murmurs, rubs, or gallops,no edema  Respiratory:  clear to auscultation bilaterally, normal work of breathing GI: soft, nontender, nondistended, + BS MS: no deformity or atrophy  Skin: warm and dry Neuro:  Strength and sensation are intact Psych: euthymic mood, full affect  EKG:  EKG is not ordered today. Personal review of the ekg ordered 08/06/18 shows atrial flutter, rate 100  Recent Labs: 07/29/2018: TSH 0.568 08/01/2018: ALT 33 08/05/2018: Magnesium 2.2 08/21/2018: BUN 21; Creatinine, Ser 1.06; Hemoglobin 11.0; Platelets 231.0; Potassium 4.9; Sodium 138    Lipid Panel     Component Value Date/Time   CHOL 202 (H) 08/27/2015 0946   TRIG 91.0 08/27/2015 0946   HDL 60.00 08/27/2015 0946   CHOLHDL 3 08/27/2015 0946   VLDL 18.2 08/27/2015 0946   LDLCALC 123 (H) 08/27/2015 0946   LDLDIRECT 100.0 02/28/2017 1544      Wt Readings from Last 3 Encounters:  08/30/18 181 lb 9.6 oz (82.4 kg)  08/23/18 180 lb 12.8 oz (82 kg)  08/21/18 183 lb 4 oz (83.1 kg)      Other studies Reviewed: Additional studies/ records that were reviewed today include: TTE 07/30/18 Review of the above records today demonstrates:  - Left ventricle: Diffuse hypokinesis worse in the inferior wall.   The cavity size was moderately dilated. Wall thickness was   normal. The estimated ejection fraction was 25%. The study is not   technically sufficient to allow evaluation of LV diastolic   function. - Aortic valve: Sclerosis without stenosis. - Mitral valve: Calcified annulus. Mildly thickened leaflets . - Left atrium: The atrium was moderately dilated. - Atrial septum: No defect or patent foramen ovale was identified.  LHC 08/05/18 1. Severe single-vessel coronary artery disease. The LCx demonstrated moderate diffuse disease with severe diffuse disease involving OM3 (small caliber branch). 2. Mild, non-obstructive disease involving LCx and dominant RCA. 3. Mildly elevated LVEDP. 4. Inferolateral myocardial calcification consistent with prior infarct/scar; this territory appears to be supplied by OM3.   ASSESSMENT AND PLAN:  1.  Typical atrial flutter: Currently on Eliquis.  I did discuss with him previously the possibility of ablation, but he wound up in the hospital and is now on amiodarone.  He has had no further arrhythmias.  He is going to talk to Dr. Earlie Server about his lung cancer.  Will await for results of that prior to any further therapy.  This patients CHA2DS2-VASc Score and unadjusted Ischemic Stroke Rate (% per year) is equal to 3.2 % stroke rate/year from a score of 3  Above score calculated as 1 point each if present [CHF, HTN, DM, Vascular=MI/PAD/Aortic Plaque, Age if 65-74, or Male] Above score calculated as 2 points each if present [Age > 75, or Stroke/TIA/TE]  2.  Systolic heart failure: Recent echo  with a continual reduced ejection fraction.  It seems that he has chronic systolic heart failure.  He does have coronary artery disease which is likely a cause as well as some nonischemic cardiomyopathy.  We will hold off on ICD therapy until lung cancer has been further addressed..  3.  Stage IIIb non-small cell lung cancer: Currently receiving chemotherapy.  Unfortunately he is tried many therapies which have not  worked.  Current medicines are reviewed at length with the patient today.   The patient does not have concerns regarding his medicines.  The following changes were made today: None  Labs/ tests ordered today include:  No orders of the defined types were placed in this encounter.  He is discussed with oncology  Disposition:   FU with Will Camnitz 6 months  Signed, [name], MD  08/30/2018 12:12 PM     [facility] [address] [place] [address] [phone] (office) [phone] (fax)

## 2018-08-30 NOTE — Patient Instructions (Signed)
Medication Instructions:  Your physician recommends that you continue on your current medications as directed. Please refer to the Current Medication list given to you today.  * If you need a refill on your cardiac medications before your next appointment, please call your pharmacy.   Labwork: None ordered *We will only notify you of abnormal results, otherwise continue current treatment plan.  Testing/Procedures: None ordered  Follow-Up: Your physician wants you to follow-up in: 6 months with Dr. [name].  You will receive a reminder letter in the mail two months in advance. If you don't receive a letter, please call our office to schedule the follow-up appointment.   Thank you for choosing [facility]!!   Trinidad Curet, RN [phone]

## 2018-09-04 ENCOUNTER — Telehealth: Payer: Self-pay | Admitting: Internal Medicine

## 2018-09-04 ENCOUNTER — Inpatient Hospital Stay: Payer: BLUE CROSS/BLUE SHIELD

## 2018-09-04 ENCOUNTER — Other Ambulatory Visit: Payer: Self-pay

## 2018-09-04 ENCOUNTER — Encounter: Payer: Self-pay | Admitting: Internal Medicine

## 2018-09-04 ENCOUNTER — Inpatient Hospital Stay: Payer: BLUE CROSS/BLUE SHIELD | Attending: Internal Medicine

## 2018-09-04 ENCOUNTER — Inpatient Hospital Stay (HOSPITAL_BASED_OUTPATIENT_CLINIC_OR_DEPARTMENT_OTHER): Payer: BLUE CROSS/BLUE SHIELD | Admitting: Internal Medicine

## 2018-09-04 VITALS — BP 118/71 | HR 61 | Temp 98.7°F | Resp 18 | Ht 71.0 in | Wt 177.3 lb

## 2018-09-04 DIAGNOSIS — C3432 Malignant neoplasm of lower lobe, left bronchus or lung: Secondary | ICD-10-CM

## 2018-09-04 DIAGNOSIS — C3492 Malignant neoplasm of unspecified part of left bronchus or lung: Secondary | ICD-10-CM

## 2018-09-04 DIAGNOSIS — I10 Essential (primary) hypertension: Secondary | ICD-10-CM

## 2018-09-04 DIAGNOSIS — R5383 Other fatigue: Secondary | ICD-10-CM | POA: Diagnosis not present

## 2018-09-04 DIAGNOSIS — I509 Heart failure, unspecified: Secondary | ICD-10-CM | POA: Diagnosis not present

## 2018-09-04 DIAGNOSIS — R531 Weakness: Secondary | ICD-10-CM | POA: Insufficient documentation

## 2018-09-04 DIAGNOSIS — R5382 Chronic fatigue, unspecified: Secondary | ICD-10-CM

## 2018-09-04 DIAGNOSIS — C3490 Malignant neoplasm of unspecified part of unspecified bronchus or lung: Secondary | ICD-10-CM

## 2018-09-04 LAB — CBC WITH DIFFERENTIAL (CANCER CENTER ONLY)
Abs Immature Granulocytes: 0.04 10*3/uL (ref 0.00–0.07)
Basophils Absolute: 0.1 10*3/uL (ref 0.0–0.1)
Basophils Relative: 1 %
Eosinophils Absolute: 0.2 10*3/uL (ref 0.0–0.5)
Eosinophils Relative: 2 %
HCT: 35.3 % — ABNORMAL LOW (ref 39.0–52.0)
Hemoglobin: 11.3 g/dL — ABNORMAL LOW (ref 13.0–17.0)
Immature Granulocytes: 0 %
Lymphocytes Relative: 14 %
Lymphs Abs: 1.5 10*3/uL (ref 0.7–4.0)
MCH: 31.9 pg (ref 26.0–34.0)
MCHC: 32 g/dL (ref 30.0–36.0)
MCV: 99.7 fL (ref 80.0–100.0)
Monocytes Absolute: 1 10*3/uL (ref 0.1–1.0)
Monocytes Relative: 10 %
Neutro Abs: 8.1 10*3/uL — ABNORMAL HIGH (ref 1.7–7.7)
Neutrophils Relative %: 73 %
Platelet Count: 268 10*3/uL (ref 150–400)
RBC: 3.54 MIL/uL — ABNORMAL LOW (ref 4.22–5.81)
RDW: 14.6 % (ref 11.5–15.5)
WBC Count: 10.9 10*3/uL — ABNORMAL HIGH (ref 4.0–10.5)
nRBC: 0 % (ref 0.0–0.2)

## 2018-09-04 LAB — CMP (CANCER CENTER ONLY)
ALT: 10 U/L (ref 0–44)
AST: 16 U/L (ref 15–41)
Albumin: 3.5 g/dL (ref 3.5–5.0)
Alkaline Phosphatase: 104 U/L (ref 38–126)
Anion gap: 12 (ref 5–15)
BUN: 25 mg/dL — ABNORMAL HIGH (ref 8–23)
CO2: 26 mmol/L (ref 22–32)
Calcium: 9.8 mg/dL (ref 8.9–10.3)
Chloride: 101 mmol/L (ref 98–111)
Creatinine: 1.2 mg/dL (ref 0.61–1.24)
GFR, Est AFR Am: >60 mL/min (ref >60)
GFR, Estimated: >60 mL/min (ref >60)
Glucose, Bld: 105 mg/dL — ABNORMAL HIGH (ref 70–99)
Potassium: 4.4 mmol/L (ref 3.5–5.1)
Sodium: 139 mmol/L (ref 135–145)
Total Bilirubin: 0.6 mg/dL (ref 0.3–1.2)
Total Protein: 7.7 g/dL (ref 6.5–8.1)

## 2018-09-04 LAB — TSH: TSH: 2.015 u[IU]/mL (ref 0.320–4.118)

## 2018-09-04 LAB — TOTAL PROTEIN, URINE DIPSTICK: Protein, ur: NEGATIVE mg/dL

## 2018-09-04 MED ORDER — SODIUM CHLORIDE 0.9% FLUSH
10.0000 mL | INTRAVENOUS | Status: DC | PRN
  Administered 2018-09-04: 10 mL via INTRAVENOUS
  Filled 2018-09-04: qty 10

## 2018-09-04 MED ORDER — HEPARIN SOD (PORK) LOCK FLUSH 100 UNIT/ML IV SOLN
500.0000 [IU] | Freq: Once | INTRAVENOUS | Status: AC | PRN
  Administered 2018-09-04: 500 [IU] via INTRAVENOUS
  Filled 2018-09-04: qty 5

## 2018-09-04 NOTE — Telephone Encounter (Signed)
Scheduled per los, printout declined  °

## 2018-09-04 NOTE — Progress Notes (Signed)
[facility] Telephone:([phone]   [phone]) [phone]  OFFICE PROGRESS NOTE  [name], MD [place] [address]  DIAGNOSIS: recurrent non-small cell lung cancer initially diagnosed as Stage IIIB (T1b, N3, M0) non-small cell lung cancer favoring adenocarcinoma presented with left lower lobe pulmonary nodule in addition to left hilar and bilateral mediastinal lymphadenopathy in addition to right supraclavicular lymph nodes diagnosed in April 2017.  PRIOR THERAPY: 1) Concurrent chemoradiation with weekly carboplatin for AUC of 2 and paclitaxel 45 mg/M2 started on 11/15/2015. He is status post 6 cycles. Last dose was getting 12/20/2015 with partial response. 2) Consolidation chemotherapy with carboplatin for AUC of 5 and paclitaxel 175 MG/M2 every 3 weeks with Neulasta support. Status post 3 cycles. Last dose was given 04/03/2016. 3) systemic chemotherapy with carboplatin for AUC of 5, Alimta 500 MG/M2 and Ketruda 200 MG IV every 3 weeks. First dose 05/16/2017.  Status post 20 cycle.  Starting from cycle #5 the patient is treated with maintenance Alimta and Keytruda only.  Last dose was given June 26, 2018. It was discontinued today secondary to disease progression. 4) Systemic chemotherapy with docetaxel 75 mg/M2 and Cyramza 10 mg/KG every 3 weeks.  First dose July 24, 2018.  This was discontinued secondary to intolerance.  CURRENT THERAPY: Observation.  INTERVAL HISTORY: Brian White 68 y.o. male returns to the clinic today for follow-up visit accompanied by his wife.  The patient is feeling fine today except for generalized fatigue and weakness.  The patient was admitted to [facility] long hospital after the first cycle of his treatment with paroxysmal atrial fibrillation/flutter with RVR as well as acute on chronic systolic heart failure.  He he went into VT arrest but was successfully resuscitated.  He was in the hospital for more than 10  days.  He started feeling a little bit better.  He denied having any current chest pain but has shortness of breath with exertion with mild cough and no hemoptysis.  He denied having any fever or chills.  He has no nausea, vomiting, diarrhea or constipation.  He is here for evaluation and recommendation regarding his condition.   MEDICAL HISTORY: Past Medical History:  Diagnosis Date  . Adenocarcinoma, lung ([place]) 10/08/2015   2.4cm spiculated LLL nodule suspicious for primary bronchogenic carcinoma by CT 09/2015 - adenocarcinoma by endobronchial biopsy  . Allergy   . Arthritis   . CAD (coronary artery disease)    mild MI; pt unaware - seen on stress test  . COPD (chronic obstructive pulmonary disease) ([place]) 06/26/2011   Mild centrilobular and paraseptal emphysema by CT 09/2015   . ED (erectile dysfunction)   . H/O anaphylactic shock 2013   hives,hypotension,syncope  . H/O seasonal allergies   . Herpes zoster 12/06/2015  . Hypertension   . Polycythemia 01/25/2014  . Prostate cancer [facility]) 2013   s/p radiation therapy March 2013, planned f/u with uro after XRT  . Radiation 09/18/2011 through 11/10/2011   Prostate 7600 cGy 40 sessions, seminal vesicles 5600 cGy 40 sessions    . Radiation 11/10/15-12/22/15   chest 60 Gy  . Shortness of breath dyspnea   . Smoker   . Thoracic aorta atherosclerosis ([place]) 10/08/2015   Mild by CT 09/2015     ALLERGIES:  is allergic to onion; cyramza [ramucirumab]; and taxotere [docetaxel].  MEDICATIONS:  Current Outpatient Medications  Medication Sig Dispense Refill  . acetaminophen (TYLENOL) 500 MG tablet Take 1,000 mg by mouth  every 6 (six) hours as needed (for pain.).    Marland Kitchen amiodarone (PACERONE) 200 MG tablet Take 1 tablet (200 mg total) by mouth daily for 30 days. 60 tablet 1  . apixaban (ELIQUIS) 5 MG TABS tablet Take 1 tablet (5 mg total) by mouth 2 (two) times daily. 60 tablet 5  . budesonide-formoterol (SYMBICORT) 160-4.5 MCG/ACT  inhaler Inhale 1 puff into the lungs 2 (two) times daily. 3 Inhaler 3  . cetirizine (ZYRTEC) 10 MG chewable tablet Chew 10 mg by mouth daily as needed (for seasonal allergies).     Marland Kitchen dexamethasone (DECADRON) 4 MG tablet TAKE 2 TABLET BY MOUTH TWICE DAILY THE DAY BEFORE, DAY OF AND AFTER THE CHEMOTHERAPY EVERY 3 WEEKS 40 tablet 2  . folic acid (FOLVITE) 1 MG tablet TAKE 1 TABLET(1 MG) BY MOUTH DAILY 30 tablet 0  . furosemide (LASIX) 40 MG tablet Take 1 tablet (40 mg total) by mouth 2 (two) times daily for 30 days. 60 tablet 1  . guaiFENesin (MUCINEX) 600 MG 12 hr tablet Take 600 mg by mouth 2 (two) times daily as needed (for cough/congestion).     Marland Kitchen levalbuterol (XOPENEX HFA) 45 MCG/ACT inhaler Inhale 2 puffs into the lungs every 4 (four) hours as needed for wheezing. 1 Inhaler 2  . lidocaine-prilocaine (EMLA) cream Apply 1 application topically as needed. Squeeze a  small amount of cream to cotton ball and apply to port site 1-2 hours prior to chemotherapy. Do not rub in the cream and cover  with plastic wrap. 30 g 0  . losartan (COZAAR) 25 MG tablet Take 1 tablet (25 mg total) by mouth daily. 30 tablet 5  . metoprolol succinate (TOPROL XL) 25 MG 24 hr tablet Take 1 tablet (25 mg total) by mouth daily. 90 tablet 3  . mirtazapine (REMERON) 15 MG tablet TAKE 1 TABLET(15 MG) BY MOUTH AT BEDTIME 30 tablet 0  . omeprazole (PRILOSEC) 20 MG capsule Take 1 capsule (20 mg total) by mouth daily. 30 capsule 1  . potassium chloride SA (K-DUR,KLOR-CON) 20 MEQ tablet Take 1 tablet (20 mEq total) by mouth daily for 30 days. 30 tablet 1  . spironolactone (ALDACTONE) 25 MG tablet Take 0.5 tablets (12.5 mg total) by mouth daily. 30 tablet 1  . triamcinolone cream (KENALOG) 0.5 % Apply 1 application topically 3 (three) times daily. 60 g 0  . prochlorperazine (COMPAZINE) 10 MG tablet Take 1 tablet (10 mg total) by mouth every 6 (six) hours as needed for nausea or vomiting. (Patient not taking: Reported on 09/04/2018) 30  tablet 0   No current facility-administered medications for this visit.     SURGICAL HISTORY:  Past Surgical History:  Procedure Laterality Date  . COLONOSCOPY W/ BIOPSIES AND POLYPECTOMY  10/11/15   mult TAs, 3cm rectal polypoid lesion; rec further treatment, diverticulosis ([place])  . ENDOBRONCHIAL ULTRASOUND Bilateral 10/18/2015   Procedure: ENDOBRONCHIAL ULTRASOUND;  Surgeon: Javier Glazier, MD;  Location: [facility] END[facility]COPY;  Service: Cardiopulmonary;  Laterality: Bilateral;  . HERNIA REPAIR Right 2005   with mesh  . INSERTION PROSTATE RADIATION SEED  08/2011   prostate cancer, [place]  . IR FLUORO GUIDE PORT INSERTION RIGHT  07/26/2017  . IR US GUIDE VASC ACCESS RIGHT  07/26/2017  . LEFT HEART CATH AND CORONARY ANGIOGRAPHY N/A 08/05/2018   Procedure: LEFT HEART CATH AND CORONARY ANGIOGRAPHY;  Surgeon: Nelva Bush, MD;  Location: [facility];  Service: Cardiovascular;  Laterality: N/A;    REVIEW OF SYSTEMS:  Constitutional:  positive for fatigue and weight loss Eyes: negative Ears, nose, mouth, throat, and face: negative Respiratory: positive for dyspnea on exertion Cardiovascular: negative Gastrointestinal: negative Genitourinary:negative Integument/breast: negative Hematologic/lymphatic: negative Musculoskeletal:positive for arthralgias Neurological: negative Behavioral/Psych: negative Endocrine: negative Allergic/Immunologic: negative   PHYSICAL EXAMINATION: General appearance: alert, cooperative, fatigued and no distress Head: Normocephalic, without obvious abnormality, atraumatic Neck: no adenopathy, no JVD, supple, symmetrical, trachea midline and thyroid not enlarged, symmetric, no tenderness/mass/nodules Lymph nodes: Cervical, supraclavicular, and axillary nodes normal. Resp: clear to auscultation bilaterally Back: symmetric, no curvature. ROM normal. No CVA tenderness. Cardio: regular rate and rhythm, S1, S2 normal, no murmur, click, rub or gallop GI:  soft, non-tender; bowel sounds normal; no masses,  no organomegaly Extremities: extremities normal, atraumatic, no cyanosis or edema Neurologic: Alert and oriented X 3, normal strength and tone. Normal symmetric reflexes. Normal coordination and gait  ECOG PERFORMANCE STATUS: 1 - Symptomatic but completely ambulatory  Blood pressure 118/71, pulse 61, temperature 98.7 F (37.1 C), temperature source Oral, resp. rate 18, height 5\' 11"  (1.803 m), weight 177 lb 4.8 oz (80.4 kg), SpO2 99 %.  LABORATORY DATA: Lab Results  Component Value Date   WBC 10.9 (H) 09/04/2018   HGB 11.3 (L) 09/04/2018   HCT 35.3 (L) 09/04/2018   MCV 99.7 09/04/2018   PLT 268 09/04/2018      Chemistry      Component Value Date/Time   NA 138 08/21/2018 1037   NA 138 07/18/2017 1000   K 4.9 08/21/2018 1037   K 4.0 07/18/2017 1000   CL 100 08/21/2018 1037   CO2 28 08/21/2018 1037   CO2 25 07/18/2017 1000   BUN 21 08/21/2018 1037   BUN 14.9 07/18/2017 1000   CREATININE 1.06 08/21/2018 1037   CREATININE 0.75 07/25/2018 0748   CREATININE 0.8 07/18/2017 1000      Component Value Date/Time   CALCIUM 9.6 08/21/2018 1037   CALCIUM 9.7 07/18/2017 1000   ALKPHOS 77 08/01/2018 0518   ALKPHOS 93 07/18/2017 1000   AST 19 08/01/2018 0518   AST 13 (L) 07/25/2018 0748   AST 22 07/18/2017 1000   ALT 33 08/01/2018 0518   ALT 10 07/25/2018 0748   ALT 24 07/18/2017 1000   BILITOT 1.2 08/01/2018 0518   BILITOT 0.4 07/25/2018 0748   BILITOT 0.54 07/18/2017 1000       RADIOGRAPHIC STUDIES: Dg Chest Port 1 View  Result Date: 08/06/2018 CLINICAL DATA:  Tachycardia EXAM: PORTABLE CHEST 1 VIEW COMPARISON:  08/02/2018 and prior radiographs FINDINGS: Cardiomegaly noted. RIGHT Port-A-Cath is again identified with tip overlying the UPPER SVC. Mild peribronchial thickening and LEFT basilar scarring again noted. There is no evidence of focal airspace disease, pulmonary edema, suspicious pulmonary nodule/mass, pleural effusion,  or pneumothorax. No acute bony abnormalities are identified. IMPRESSION: Cardiomegaly without evidence of acute cardiopulmonary disease. Electronically Signed   By: Margarette Canada M.D.   On: 08/06/2018 13:12    ASSESSMENT AND PLAN:  This is a very pleasant 68 years old white male with recurrent non-small cell lung cancer initially diagnosed as stage IIIB non-small cell lung cancer favoring adenocarcinoma status post concurrent chemoradiation followed by consolidation chemotherapy. His last treatment was in September 2017. Restaging scan showed evidence for disease progression and the patient was started on systemic chemotherapy with carboplatin, Alimta and Keytruda for 4 cycles.  His repeat imaging studies showed improvement of his disease.  He is currently undergoing maintenance treatment with Alimta and Keytruda status post 20 cycles. The patient  has been tolerating this treatment well with no concerning adverse effect except for fatigue. He had repeat CT scan of the chest, abdomen and pelvis performed recently.  I personally and independently reviewed the scan images and discussed the result and showed the images to the patient and his wife. Unfortunately his a scan showed evidence for disease progression involving mediastinal lymph nodes as well as nodularity in the left lower lobe as progression of retroperitoneal lymph nodes. His treatment was discontinued and the patient was started on systemic chemotherapy with docetaxel and Cyramza status post 1 cycle.  Unfortunately his treatment was complicated with atrial fibrillation as well as VT arrest but was successfully resuscitated. I had a lengthy discussion with the patient and his wife today about his current condition and treatment options. I do not think the patient is a great candidate for any systemic chemotherapy with his current condition. I recommended for him to continue on observation with repeat CT scan of the chest, abdomen and pelvis in 2  months for restaging of his disease.  Depending on his performance status at that time we will discuss treatment options. For the congestive heart failure, the patient will continue his routine follow-up visit and evaluation by his cardiologist. The patient was advised to call immediately if he has any concerning symptoms in the interval. The patient voices understanding of current disease status and treatment options and is in agreement with the current care plan. All questions were answered. The patient knows to call the clinic with any problems, questions or concerns. We can certainly see the patient much sooner if necessary.  Disclaimer: This note was dictated with voice recognition software. Similar sounding words can inadvertently be transcribed and may not be corrected upon review.

## 2018-09-05 ENCOUNTER — Other Ambulatory Visit: Payer: Self-pay | Admitting: Oncology

## 2018-09-11 ENCOUNTER — Other Ambulatory Visit: Payer: Medicare Other

## 2018-09-16 ENCOUNTER — Other Ambulatory Visit: Payer: Self-pay | Admitting: Internal Medicine

## 2018-09-16 DIAGNOSIS — C3432 Malignant neoplasm of lower lobe, left bronchus or lung: Secondary | ICD-10-CM

## 2018-09-17 ENCOUNTER — Telehealth: Payer: Self-pay | Admitting: Oncology

## 2018-09-17 ENCOUNTER — Other Ambulatory Visit: Payer: Self-pay | Admitting: Internal Medicine

## 2018-09-17 DIAGNOSIS — C3432 Malignant neoplasm of lower lobe, left bronchus or lung: Secondary | ICD-10-CM

## 2018-09-17 DIAGNOSIS — Z7189 Other specified counseling: Secondary | ICD-10-CM

## 2018-09-17 DIAGNOSIS — Z5112 Encounter for antineoplastic immunotherapy: Secondary | ICD-10-CM

## 2018-09-17 DIAGNOSIS — R5382 Chronic fatigue, unspecified: Secondary | ICD-10-CM

## 2018-09-17 DIAGNOSIS — Z23 Encounter for immunization: Secondary | ICD-10-CM

## 2018-09-17 NOTE — Telephone Encounter (Signed)
Brian White contacted to obtain verbal, telephone consent to share their name and contact information with [employer], product/process development and team) for purposes of soliciting patient experience feedback.  Verbal consent obtained and documented on "[name]" form.  Brian White is aware that [name] will be in contact with them at a future date for screening purposes for interviews.  Please direct questions related to this process to Sherry Ruffing via email at [email or web address] or extension [phone].

## 2018-09-18 ENCOUNTER — Other Ambulatory Visit: Payer: Medicare Other

## 2018-10-01 ENCOUNTER — Other Ambulatory Visit: Payer: Self-pay | Admitting: Internal Medicine

## 2018-10-01 DIAGNOSIS — Z7189 Other specified counseling: Secondary | ICD-10-CM

## 2018-10-01 DIAGNOSIS — Z5112 Encounter for antineoplastic immunotherapy: Secondary | ICD-10-CM

## 2018-10-01 DIAGNOSIS — Z23 Encounter for immunization: Secondary | ICD-10-CM

## 2018-10-01 DIAGNOSIS — C3432 Malignant neoplasm of lower lobe, left bronchus or lung: Secondary | ICD-10-CM

## 2018-10-01 DIAGNOSIS — R5382 Chronic fatigue, unspecified: Secondary | ICD-10-CM

## 2018-10-02 ENCOUNTER — Other Ambulatory Visit: Payer: Self-pay | Admitting: Internal Medicine

## 2018-10-02 DIAGNOSIS — Z23 Encounter for immunization: Secondary | ICD-10-CM

## 2018-10-02 DIAGNOSIS — Z5112 Encounter for antineoplastic immunotherapy: Secondary | ICD-10-CM

## 2018-10-02 DIAGNOSIS — R5382 Chronic fatigue, unspecified: Secondary | ICD-10-CM

## 2018-10-02 DIAGNOSIS — Z7189 Other specified counseling: Secondary | ICD-10-CM

## 2018-10-02 DIAGNOSIS — C3432 Malignant neoplasm of lower lobe, left bronchus or lung: Secondary | ICD-10-CM

## 2018-10-02 NOTE — Telephone Encounter (Signed)
Duplicate requests for Mirtazapine not authorized at this time with recent refill order dated 09-17-2018.

## 2018-10-08 ENCOUNTER — Telehealth: Payer: Self-pay | Admitting: Family Medicine

## 2018-10-08 MED ORDER — POTASSIUM CHLORIDE CRYS ER 20 MEQ PO TBCR: 20.0000 meq | EXTENDED_RELEASE_TABLET | Freq: Every day | ORAL | 6 refills | Status: DC

## 2018-10-08 MED ORDER — FUROSEMIDE 40 MG PO TABS: 40.0000 mg | ORAL_TABLET | Freq: Two times a day (BID) | ORAL | 6 refills | Status: DC

## 2018-10-08 NOTE — Telephone Encounter (Signed)
Yes I do recommend we continue potassium 39mEq daily. I have refilled for him. Let us know if he wants 90d supply.

## 2018-10-08 NOTE — Telephone Encounter (Signed)
Pt was in the hospital and prescribed potassium chloride and pt want to know if he need to continue because he has no more refills. Please call and advise pt.

## 2018-10-08 NOTE — Telephone Encounter (Signed)
Spoke with pt and wife, [name], relaying Dr. Synthia Innocent message. Verbalizes understanding and states the 30-day rx is fine.

## 2018-10-16 ENCOUNTER — Other Ambulatory Visit: Payer: Self-pay | Admitting: Internal Medicine

## 2018-10-16 DIAGNOSIS — C3432 Malignant neoplasm of lower lobe, left bronchus or lung: Secondary | ICD-10-CM

## 2018-11-01 ENCOUNTER — Inpatient Hospital Stay: Payer: BLUE CROSS/BLUE SHIELD | Attending: Internal Medicine

## 2018-11-01 ENCOUNTER — Other Ambulatory Visit: Payer: BLUE CROSS/BLUE SHIELD

## 2018-11-01 ENCOUNTER — Ambulatory Visit (HOSPITAL_COMMUNITY): Admission: RE | Admit: 2018-11-01 | Payer: BLUE CROSS/BLUE SHIELD | Source: Ambulatory Visit

## 2018-11-05 ENCOUNTER — Ambulatory Visit: Payer: BLUE CROSS/BLUE SHIELD | Admitting: Internal Medicine

## 2018-11-06 ENCOUNTER — Other Ambulatory Visit: Payer: BLUE CROSS/BLUE SHIELD

## 2018-11-06 ENCOUNTER — Telehealth: Payer: Self-pay | Admitting: Internal Medicine

## 2018-11-06 NOTE — Telephone Encounter (Signed)
Scheduled appt per 4/21 sch message - unable to reach patient . Left message with appt date and time

## 2018-11-07 ENCOUNTER — Ambulatory Visit: Payer: BLUE CROSS/BLUE SHIELD | Admitting: Internal Medicine

## 2018-11-13 ENCOUNTER — Encounter: Payer: BLUE CROSS/BLUE SHIELD | Admitting: Family Medicine

## 2018-11-13 ENCOUNTER — Encounter: Payer: Self-pay | Admitting: Internal Medicine

## 2018-11-18 ENCOUNTER — Telehealth: Payer: Self-pay

## 2018-11-18 ENCOUNTER — Telehealth: Payer: Self-pay | Admitting: Medical Oncology

## 2018-11-18 NOTE — Telephone Encounter (Signed)
Spoke with wife - she is worried he stopped eating since Friday. Previous to this was eating milk shake a few times a week.  Now noting increased fatigue and weight loss "I'm ready to go".  Upcoming CT scan has been moved forward to Wed.   BP running low - 101/73, this morning 96/55, HR 55. Good O2 sats.   Will await CT scan results then decide on further treatment vs hospice. Wife doesn't think he's strong enough to return to chemo.

## 2018-11-18 NOTE — Telephone Encounter (Signed)
Declining health- pt has lost 20 lbs since feb, not eating, sleeping all the times. Uses a walker to go to bathroom. 'I believe he is giving up".  Per [facility] scans moved up . Schedule message sent to central schedule and cancer center scheduler.

## 2018-11-18 NOTE — Telephone Encounter (Signed)
Copied from [place] [id number]. Topic: General - Inquiry >> Nov 15, 2018  4:17 PM Richardo Priest, NT wrote: Reason for CRM: Patient's wife is calling in stating she would like to speak with Dr.Gutierrez in regards to her husband. States he is declining due to his terminal cancer and she doesn't know what the next step is. Requesting a call back at [phone] to discuss possible hospice.

## 2018-11-19 ENCOUNTER — Telehealth: Payer: Self-pay

## 2018-11-19 ENCOUNTER — Telehealth: Payer: Self-pay | Admitting: Internal Medicine

## 2018-11-19 NOTE — Telephone Encounter (Signed)
Virtual Visit Pre-Appointment Phone Call  "(Name), I am calling you today to discuss your upcoming appointment. We are currently trying to limit exposure to the virus that causes COVID-19 by seeing patients at home rather than in the office."  1. "What is the BEST phone number to call the day of the visit?" - include this in appointment notes  2. "Do you have or have access to (through a family member/friend) a smartphone with video capability that we can use for your visit?" a. If yes - list this number in appt notes as "cell" (if different from BEST phone #) and list the appointment type as a VIDEO visit in appointment notes b. If no - list the appointment type as a PHONE visit in appointment notes  3. Confirm consent - "In the setting of the current Covid19 crisis, you are scheduled for a TELEPHONE visit with your provider on 11/22/2018 at 9:00AM.  Just as we do with many in-office visits, in order for you to participate in this visit, we must obtain consent.  If you'd like, I can send this to your mychart (if signed up) or email for you to review.  Otherwise, I can obtain your verbal consent now.  All virtual visits are billed to your insurance company just like a normal visit would be.  By agreeing to a virtual visit, we'd like you to understand that the technology does not allow for your provider to perform an examination, and thus may limit your provider's ability to fully assess your condition. If your provider identifies any concerns that need to be evaluated in person, we will make arrangements to do so.  Finally, though the technology is pretty good, we cannot assure that it will always work on either your or our end, and in the setting of a video visit, we may have to convert it to a phone-only visit.  In either situation, we cannot ensure that we have a secure connection.  Are you willing to proceed?" STAFF: Did the patient verbally acknowledge consent to telehealth visit? Document YES/NO  here: YES  4. Advise patient to be prepared - "Two hours prior to your appointment, go ahead and check your blood pressure, pulse, oxygen saturation, and your weight (if you have the equipment to check those) and write them all down. When your visit starts, your provider will ask you for this information. If you have an Apple Watch or Kardia device, please plan to have heart rate information ready on the day of your appointment. Please have a pen and paper handy nearby the day of the visit as well."  5. Give patient instructions for MyChart download to smartphone OR Doximity/Doxy.me as below if video visit (depending on what platform provider is using)  6. Inform patient they will receive a phone call 15 minutes prior to their appointment time (may be from unknown caller ID) so they should be prepared to answer    TELEPHONE CALL NOTE  Brian White has been deemed a candidate for a follow-up tele-health visit to limit community exposure during the Covid-19 pandemic. I spoke with the patient via phone to ensure availability of phone/video source, confirm preferred email & phone number, and discuss instructions and expectations.  I reminded Brian White to be prepared with any vital sign and/or heart rhythm information that could potentially be obtained via home monitoring, at the time of his visit. I reminded Brian White to expect a phone call prior to his visit.  Jacqulynn Cadet,  CMA 11/19/2018 12:51 PM   INSTRUCTIONS FOR DOWNLOADING THE MYCHART APP TO SMARTPHONE  - The patient must first make sure to have activated MyChart and know their login information - If Apple, go to [employer] and type in MyChart in the search bar and download the app. If Android, ask patient to go to [employer] and type in [place] in the search bar and download the app. The app is free but as with any other app downloads, their phone may require them to verify saved payment information or Apple/Android password.   - The patient will need to then log into the app with their MyChart username and password, and select [facility] as their healthcare provider to link the account. When it is time for your visit, go to the MyChart app, find appointments, and click Begin Video Visit. Be sure to Select Allow for your device to access the Microphone and Camera for your visit. You will then be connected, and your provider will be with you shortly.  **If they have any issues connecting, or need assistance please contact MyChart service desk [phone])**  **If using a computer, in order to ensure the best quality for their visit they will need to use either of the following Internet Browsers: [employer], or Google Chrome**  IF USING DOXIMITY or DOXY.ME - The patient will receive a link just prior to their visit by text.     FULL LENGTH CONSENT FOR TELE-HEALTH VISIT   I hereby voluntarily request, consent and authorize [facility] and its employed or contracted physicians, physician assistants, nurse practitioners or other licensed health care professionals (the Practitioner), to provide me with telemedicine health care services (the "Services") as deemed necessary by the treating Practitioner. I acknowledge and consent to receive the Services by the Practitioner via telemedicine. I understand that the telemedicine visit will involve communicating with the Practitioner through live audiovisual communication technology and the disclosure of certain medical information by electronic transmission. I acknowledge that I have been given the opportunity to request an in-person assessment or other available alternative prior to the telemedicine visit and am voluntarily participating in the telemedicine visit.  I understand that I have the right to withhold or withdraw my consent to the use of telemedicine in the course of my care at any time, without affecting my right to future care or treatment, and that  the Practitioner or I may terminate the telemedicine visit at any time. I understand that I have the right to inspect all information obtained and/or recorded in the course of the telemedicine visit and may receive copies of available information for a reasonable fee.  I understand that some of the potential risks of receiving the Services via telemedicine include:  Marland Kitchen Delay or interruption in medical evaluation due to technological equipment failure or disruption; . Information transmitted may not be sufficient (e.g. poor resolution of images) to allow for appropriate medical decision making by the Practitioner; and/or  . In rare instances, security protocols could fail, causing a breach of personal health information.  Furthermore, I acknowledge that it is my responsibility to provide information about my medical history, conditions and care that is complete and accurate to the best of my ability. I acknowledge that Practitioner's advice, recommendations, and/or decision may be based on factors not within their control, such as incomplete or inaccurate data provided by me or distortions of diagnostic images or specimens that may result from electronic transmissions. I understand that the practice of  medicine is not an Chief Strategy Officer and that Practitioner makes no warranties or guarantees regarding treatment outcomes. I acknowledge that I will receive a copy of this consent concurrently upon execution via email to the email address I last provided but may also request a printed copy by calling the office of [facility].    I understand that my insurance will be billed for this visit.   I have read or had this consent read to me. . I understand the contents of this consent, which adequately explains the benefits and risks of the Services being provided via telemedicine.  . I have been provided ample opportunity to ask questions regarding this consent and the Services and have had my questions answered to  my satisfaction. . I give my informed consent for the services to be provided through the use of telemedicine in my medical care  By participating in this telemedicine visit I agree to the above.

## 2018-11-19 NOTE — Telephone Encounter (Signed)
Rescheduled lab and flush for CT per sch msg. Called and spoke with patients wife, Jeannene Patella. Confirmed date and time

## 2018-11-20 ENCOUNTER — Inpatient Hospital Stay: Payer: BLUE CROSS/BLUE SHIELD

## 2018-11-20 ENCOUNTER — Inpatient Hospital Stay: Payer: BLUE CROSS/BLUE SHIELD | Attending: Internal Medicine

## 2018-11-20 ENCOUNTER — Encounter (HOSPITAL_COMMUNITY): Payer: Self-pay

## 2018-11-20 ENCOUNTER — Ambulatory Visit (HOSPITAL_COMMUNITY)
Admission: RE | Admit: 2018-11-20 | Discharge: 2018-11-20 | Disposition: A | Discharge status: Home / Self Care | Payer: BLUE CROSS/BLUE SHIELD | Source: Ambulatory Visit | Attending: Internal Medicine | Admitting: Internal Medicine

## 2018-11-20 ENCOUNTER — Other Ambulatory Visit: Payer: Self-pay

## 2018-11-20 DIAGNOSIS — C3490 Malignant neoplasm of unspecified part of unspecified bronchus or lung: Secondary | ICD-10-CM

## 2018-11-20 DIAGNOSIS — Z79899 Other long term (current) drug therapy: Secondary | ICD-10-CM | POA: Diagnosis not present

## 2018-11-20 DIAGNOSIS — C3432 Malignant neoplasm of lower lobe, left bronchus or lung: Secondary | ICD-10-CM | POA: Diagnosis not present

## 2018-11-20 LAB — CBC WITH DIFFERENTIAL (CANCER CENTER ONLY)
Abs Immature Granulocytes: 0.16 10*3/uL — ABNORMAL HIGH (ref 0.00–0.07)
Basophils Absolute: 0.1 10*3/uL (ref 0.0–0.1)
Basophils Relative: 0 %
Eosinophils Absolute: 0.3 10*3/uL (ref 0.0–0.5)
Eosinophils Relative: 2 %
HCT: 37 % — ABNORMAL LOW (ref 39.0–52.0)
Hemoglobin: 12 g/dL — ABNORMAL LOW (ref 13.0–17.0)
Immature Granulocytes: 1 %
Lymphocytes Relative: 7 %
Lymphs Abs: 1.2 10*3/uL (ref 0.7–4.0)
MCH: 30.1 pg (ref 26.0–34.0)
MCHC: 32.4 g/dL (ref 30.0–36.0)
MCV: 92.7 fL (ref 80.0–100.0)
Monocytes Absolute: 1.3 10*3/uL — ABNORMAL HIGH (ref 0.1–1.0)
Monocytes Relative: 8 %
Neutro Abs: 13.2 10*3/uL — ABNORMAL HIGH (ref 1.7–7.7)
Neutrophils Relative %: 82 %
Platelet Count: 275 10*3/uL (ref 150–400)
RBC: 3.99 MIL/uL — ABNORMAL LOW (ref 4.22–5.81)
RDW: 14.9 % (ref 11.5–15.5)
WBC Count: 16.1 10*3/uL — ABNORMAL HIGH (ref 4.0–10.5)
nRBC: 0 % (ref 0.0–0.2)

## 2018-11-20 LAB — CMP (CANCER CENTER ONLY)
ALT: 11 U/L (ref 0–44)
AST: 8 U/L — ABNORMAL LOW (ref 15–41)
Albumin: 3.2 g/dL — ABNORMAL LOW (ref 3.5–5.0)
Alkaline Phosphatase: 88 U/L (ref 38–126)
Anion gap: 12 (ref 5–15)
BUN: 18 mg/dL (ref 8–23)
CO2: 25 mmol/L (ref 22–32)
Calcium: 10.1 mg/dL (ref 8.9–10.3)
Chloride: 98 mmol/L (ref 98–111)
Creatinine: 1.22 mg/dL (ref 0.61–1.24)
GFR, Est AFR Am: >60 mL/min (ref >60)
GFR, Estimated: >60 mL/min (ref >60)
Glucose, Bld: 132 mg/dL — ABNORMAL HIGH (ref 70–99)
Potassium: 4.3 mmol/L (ref 3.5–5.1)
Sodium: 135 mmol/L (ref 135–145)
Total Bilirubin: 0.6 mg/dL (ref 0.3–1.2)
Total Protein: 7.1 g/dL (ref 6.5–8.1)

## 2018-11-20 LAB — TSH: TSH: 1.253 u[IU]/mL (ref 0.320–4.118)

## 2018-11-20 LAB — TOTAL PROTEIN, URINE DIPSTICK: Protein, ur: NEGATIVE mg/dL

## 2018-11-20 MED ORDER — IOHEXOL 300 MG/ML  SOLN
100.0000 mL | Freq: Once | INTRAMUSCULAR | Status: AC | PRN
  Administered 2018-11-20: 100 mL via INTRAVENOUS

## 2018-11-20 MED ORDER — SODIUM CHLORIDE 0.9% FLUSH
10.0000 mL | INTRAVENOUS | Status: DC | PRN
  Administered 2018-11-20: 10 mL via INTRAVENOUS
  Filled 2018-11-20: qty 10

## 2018-11-20 MED ORDER — HEPARIN SOD (PORK) LOCK FLUSH 100 UNIT/ML IV SOLN
INTRAVENOUS | Status: AC
  Administered 2018-11-20: 500 [IU] via INTRAVENOUS
  Filled 2018-11-20: qty 5

## 2018-11-20 MED ORDER — HEPARIN SOD (PORK) LOCK FLUSH 100 UNIT/ML IV SOLN
500.0000 [IU] | Freq: Once | INTRAVENOUS | Status: AC
  Administered 2018-11-20: 10:00:00 500 [IU] via INTRAVENOUS

## 2018-11-20 MED ORDER — SODIUM CHLORIDE (PF) 0.9 % IJ SOLN
INTRAMUSCULAR | Status: AC
  Filled 2018-11-20: qty 50

## 2018-11-21 ENCOUNTER — Inpatient Hospital Stay
Admission: EM | Admit: 2018-11-21 | Discharge: 2018-11-22 | DRG: 181 | Disposition: A | Discharge status: Hospice | Payer: BLUE CROSS/BLUE SHIELD | Attending: Internal Medicine | Admitting: Internal Medicine

## 2018-11-21 ENCOUNTER — Encounter: Payer: Self-pay | Admitting: Emergency Medicine

## 2018-11-21 ENCOUNTER — Emergency Department: Payer: BLUE CROSS/BLUE SHIELD

## 2018-11-21 ENCOUNTER — Other Ambulatory Visit: Payer: Self-pay

## 2018-11-21 DIAGNOSIS — I959 Hypotension, unspecified: Secondary | ICD-10-CM | POA: Diagnosis present

## 2018-11-21 DIAGNOSIS — Z825 Family history of asthma and other chronic lower respiratory diseases: Secondary | ICD-10-CM

## 2018-11-21 DIAGNOSIS — I480 Paroxysmal atrial fibrillation: Secondary | ICD-10-CM | POA: Diagnosis present

## 2018-11-21 DIAGNOSIS — Z9221 Personal history of antineoplastic chemotherapy: Secondary | ICD-10-CM

## 2018-11-21 DIAGNOSIS — F17210 Nicotine dependence, cigarettes, uncomplicated: Secondary | ICD-10-CM | POA: Diagnosis present

## 2018-11-21 DIAGNOSIS — I4892 Unspecified atrial flutter: Secondary | ICD-10-CM | POA: Diagnosis present

## 2018-11-21 DIAGNOSIS — Z7901 Long term (current) use of anticoagulants: Secondary | ICD-10-CM

## 2018-11-21 DIAGNOSIS — I110 Hypertensive heart disease with heart failure: Secondary | ICD-10-CM | POA: Diagnosis present

## 2018-11-21 DIAGNOSIS — C3490 Malignant neoplasm of unspecified part of unspecified bronchus or lung: Secondary | ICD-10-CM | POA: Diagnosis present

## 2018-11-21 DIAGNOSIS — Z515 Encounter for palliative care: Secondary | ICD-10-CM | POA: Diagnosis present

## 2018-11-21 DIAGNOSIS — J439 Emphysema, unspecified: Secondary | ICD-10-CM | POA: Diagnosis present

## 2018-11-21 DIAGNOSIS — Z79899 Other long term (current) drug therapy: Secondary | ICD-10-CM

## 2018-11-21 DIAGNOSIS — Z7952 Long term (current) use of systemic steroids: Secondary | ICD-10-CM

## 2018-11-21 DIAGNOSIS — J9690 Respiratory failure, unspecified, unspecified whether with hypoxia or hypercapnia: Secondary | ICD-10-CM

## 2018-11-21 DIAGNOSIS — I2510 Atherosclerotic heart disease of native coronary artery without angina pectoris: Secondary | ICD-10-CM | POA: Diagnosis present

## 2018-11-21 DIAGNOSIS — Z803 Family history of malignant neoplasm of breast: Secondary | ICD-10-CM

## 2018-11-21 DIAGNOSIS — I5042 Chronic combined systolic (congestive) and diastolic (congestive) heart failure: Secondary | ICD-10-CM | POA: Diagnosis present

## 2018-11-21 DIAGNOSIS — F419 Anxiety disorder, unspecified: Secondary | ICD-10-CM | POA: Diagnosis present

## 2018-11-21 DIAGNOSIS — Z66 Do not resuscitate: Secondary | ICD-10-CM | POA: Diagnosis present

## 2018-11-21 DIAGNOSIS — R059 Cough, unspecified: Secondary | ICD-10-CM

## 2018-11-21 DIAGNOSIS — Z923 Personal history of irradiation: Secondary | ICD-10-CM

## 2018-11-21 DIAGNOSIS — C3402 Malignant neoplasm of left main bronchus: Secondary | ICD-10-CM | POA: Diagnosis not present

## 2018-11-21 DIAGNOSIS — R05 Cough: Secondary | ICD-10-CM

## 2018-11-21 DIAGNOSIS — Z20828 Contact with and (suspected) exposure to other viral communicable diseases: Secondary | ICD-10-CM | POA: Diagnosis present

## 2018-11-21 DIAGNOSIS — J9811 Atelectasis: Secondary | ICD-10-CM | POA: Diagnosis present

## 2018-11-21 DIAGNOSIS — Z8546 Personal history of malignant neoplasm of prostate: Secondary | ICD-10-CM

## 2018-11-21 DIAGNOSIS — Z7951 Long term (current) use of inhaled steroids: Secondary | ICD-10-CM

## 2018-11-21 DIAGNOSIS — Z8249 Family history of ischemic heart disease and other diseases of the circulatory system: Secondary | ICD-10-CM

## 2018-11-21 DIAGNOSIS — K219 Gastro-esophageal reflux disease without esophagitis: Secondary | ICD-10-CM | POA: Diagnosis present

## 2018-11-21 DIAGNOSIS — J9809 Other diseases of bronchus, not elsewhere classified: Secondary | ICD-10-CM | POA: Diagnosis present

## 2018-11-21 DIAGNOSIS — Z888 Allergy status to other drugs, medicaments and biological substances status: Secondary | ICD-10-CM

## 2018-11-21 LAB — COMPREHENSIVE METABOLIC PANEL
ALT: 13 U/L (ref 0–44)
AST: 14 U/L — ABNORMAL LOW (ref 15–41)
Albumin: 3.4 g/dL — ABNORMAL LOW (ref 3.5–5.0)
Alkaline Phosphatase: 78 U/L (ref 38–126)
Anion gap: 13 (ref 5–15)
BUN: 22 mg/dL (ref 8–23)
CO2: 23 mmol/L (ref 22–32)
Calcium: 9.5 mg/dL (ref 8.9–10.3)
Chloride: 95 mmol/L — ABNORMAL LOW (ref 98–111)
Creatinine, Ser: 1.16 mg/dL (ref 0.61–1.24)
GFR calc Af Amer: >60 mL/min (ref >60)
GFR calc non Af Amer: >60 mL/min (ref >60)
Glucose, Bld: 162 mg/dL — ABNORMAL HIGH (ref 70–99)
Potassium: 4.7 mmol/L (ref 3.5–5.1)
Sodium: 131 mmol/L — ABNORMAL LOW (ref 135–145)
Total Bilirubin: 1.1 mg/dL (ref 0.3–1.2)
Total Protein: 7 g/dL (ref 6.5–8.1)

## 2018-11-21 LAB — BLOOD GAS, VENOUS
Acid-Base Excess: 2.5 mmol/L — ABNORMAL HIGH (ref 0.0–2.0)
Bicarbonate: 26.3 mmol/L (ref 20.0–28.0)
O2 Saturation: 59.8 %
Patient temperature: 37
pCO2, Ven: 37 mmHg — ABNORMAL LOW (ref 44.0–60.0)
pH, Ven: 7.46 — ABNORMAL HIGH (ref 7.250–7.430)
pO2, Ven: <31 mmHg — CL (ref 32.0–45.0)

## 2018-11-21 LAB — CBC WITH DIFFERENTIAL/PLATELET
Abs Immature Granulocytes: 0.15 10*3/uL — ABNORMAL HIGH (ref 0.00–0.07)
Basophils Absolute: 0.1 10*3/uL (ref 0.0–0.1)
Basophils Relative: 0 %
Eosinophils Absolute: 0.1 10*3/uL (ref 0.0–0.5)
Eosinophils Relative: 1 %
HCT: 33.3 % — ABNORMAL LOW (ref 39.0–52.0)
Hemoglobin: 11.1 g/dL — ABNORMAL LOW (ref 13.0–17.0)
Immature Granulocytes: 1 %
Lymphocytes Relative: 5 %
Lymphs Abs: 0.8 10*3/uL (ref 0.7–4.0)
MCH: 30.4 pg (ref 26.0–34.0)
MCHC: 33.3 g/dL (ref 30.0–36.0)
MCV: 91.2 fL (ref 80.0–100.0)
Monocytes Absolute: 1.3 10*3/uL — ABNORMAL HIGH (ref 0.1–1.0)
Monocytes Relative: 8 %
Neutro Abs: 13.5 10*3/uL — ABNORMAL HIGH (ref 1.7–7.7)
Neutrophils Relative %: 85 %
Platelets: 247 10*3/uL (ref 150–400)
RBC: 3.65 MIL/uL — ABNORMAL LOW (ref 4.22–5.81)
RDW: 15.1 % (ref 11.5–15.5)
WBC: 15.9 10*3/uL — ABNORMAL HIGH (ref 4.0–10.5)
nRBC: 0 % (ref 0.0–0.2)

## 2018-11-21 LAB — TROPONIN I: Troponin I: <0.03 ng/mL (ref <0.03)

## 2018-11-21 LAB — BRAIN NATRIURETIC PEPTIDE: B Natriuretic Peptide: 160 pg/mL — ABNORMAL HIGH (ref 0.0–100.0)

## 2018-11-21 LAB — SARS CORONAVIRUS 2 BY RT PCR (HOSPITAL ORDER, PERFORMED IN ~~LOC~~ HOSPITAL LAB): SARS Coronavirus 2: NEGATIVE

## 2018-11-21 LAB — LACTIC ACID, PLASMA: Lactic Acid, Venous: 2.2 mmol/L (ref 0.5–1.9)

## 2018-11-21 MED ORDER — ALBUTEROL SULFATE HFA 108 (90 BASE) MCG/ACT IN AERS
2.0000 | INHALATION_SPRAY | Freq: Once | RESPIRATORY_TRACT | Status: AC
  Administered 2018-11-21: 2 via RESPIRATORY_TRACT
  Filled 2018-11-21: qty 6.7

## 2018-11-21 MED ORDER — MORPHINE 100MG IN NS 100ML (1MG/ML) PREMIX INFUSION
3.0000 mg/h | INTRAVENOUS | Status: DC
  Filled 2018-11-21: qty 100

## 2018-11-21 MED ORDER — SODIUM CHLORIDE 0.9 % IV SOLN
500.0000 mg | Freq: Once | INTRAVENOUS | Status: AC
  Administered 2018-11-21: 22:00:00 500 mg via INTRAVENOUS
  Filled 2018-11-21: qty 500

## 2018-11-21 MED ORDER — SODIUM CHLORIDE 0.9 % IV SOLN
1.0000 g | Freq: Once | INTRAVENOUS | Status: AC
  Administered 2018-11-21: 1 g via INTRAVENOUS
  Filled 2018-11-21: qty 10

## 2018-11-21 MED ORDER — METHYLPREDNISOLONE SODIUM SUCC 125 MG IJ SOLR
125.0000 mg | Freq: Once | INTRAMUSCULAR | Status: AC
  Administered 2018-11-21: 21:00:00 125 mg via INTRAVENOUS
  Filled 2018-11-21: qty 2

## 2018-11-21 MED ORDER — ALBUTEROL SULFATE (2.5 MG/3ML) 0.083% IN NEBU
2.5000 mg | INHALATION_SOLUTION | RESPIRATORY_TRACT | Status: DC | PRN
  Administered 2018-11-21: 2.5 mg via RESPIRATORY_TRACT
  Filled 2018-11-21: qty 3

## 2018-11-21 MED ORDER — SODIUM CHLORIDE 0.9 % IV BOLUS
500.0000 mL | Freq: Once | INTRAVENOUS | Status: AC
  Administered 2018-11-21: 500 mL via INTRAVENOUS

## 2018-11-21 MED ORDER — FENTANYL CITRATE (PF) 100 MCG/2ML IJ SOLN
50.0000 ug | Freq: Once | INTRAMUSCULAR | Status: AC
  Administered 2018-11-21: 23:00:00 50 ug via INTRAVENOUS
  Filled 2018-11-21: qty 2

## 2018-11-21 MED ORDER — KETAMINE HCL 10 MG/ML IJ SOLN
20.0000 mg | Freq: Once | INTRAMUSCULAR | Status: AC
  Administered 2018-11-21: 20 mg via INTRAVENOUS
  Filled 2018-11-21: qty 1

## 2018-11-21 NOTE — ED Triage Notes (Signed)
Patient arrives via EMS for worsening SOB, fever. Patient has COPD, CHF, lung CA, and does smoke. Patient has not been around any sick people to his knowledge

## 2018-11-21 NOTE — ED Provider Notes (Addendum)
[facility] Emergency Department Provider Note  ____________________________________________   I have reviewed the triage vital signs and the nursing notes. Where available I have reviewed prior notes and, if possible and indicated, outside hospital notes.    HISTORY  Chief Complaint Respiratory Distress and Fever    HPI Brian White is a 68 y.o. male who presents today complaining of shortness of breath.  Patient does have a history of CAD COPD, not on home oxygen, lung adenocarcinoma, with a CT scan yesterday which showed soft tissue mass on the left hilum mainstem bronchus and carina enlarged compared with prior, with complete obstruction of the left mainstem bronchus mass abuts and may invade the mid esophagus large left pleural effusion with total of postobstructive atelectasis or consolidation of the left lung.  And is getting infusions from oncology.  He states he may have had a low fever and starting this morning he became short of breath and having cough.  He denies high fever.  EMS states his sats were good but his respiratory rate was elevated for them.  He is he states not interested in having any kind of tubes or life-saving procedure such as intubation.  He is awake and alert and tells me that he does not want to be on a ventilator he would rather die.    Past Medical History:  Diagnosis Date  . Adenocarcinoma, lung ([place]) 10/08/2015   2.4cm spiculated LLL nodule suspicious for primary bronchogenic carcinoma by CT 09/2015 - adenocarcinoma by endobronchial biopsy  . Allergy   . Arthritis   . CAD (coronary artery disease)    mild MI; pt unaware - seen on stress test  . COPD (chronic obstructive pulmonary disease) ([place]) 06/26/2011   Mild centrilobular and paraseptal emphysema by CT 09/2015   . ED (erectile dysfunction)   . H/O anaphylactic shock 2013   hives,hypotension,syncope  . H/O seasonal allergies   . Herpes zoster 12/06/2015  . Hypertension   .  Polycythemia 01/25/2014  . Prostate cancer [facility]) 2013   s/p radiation therapy March 2013, planned f/u with uro after XRT  . Radiation 09/18/2011 through 11/10/2011   Prostate 7600 cGy 40 sessions, seminal vesicles 5600 cGy 40 sessions    . Radiation 11/10/15-12/22/15   chest 60 Gy  . Shortness of breath dyspnea   . Smoker   . Thoracic aorta atherosclerosis ([place]) 10/08/2015   Mild by CT 09/2015     Patient Active Problem List   Diagnosis Date Noted  . Acute on chronic combined systolic and diastolic CHF (congestive heart failure) ([place])   . Primary malignant neoplasm of lung metastatic to other site [facility])   . Ventricular tachycardia ([place])   . Atrial fibrillation with RVR ([place]) 07/29/2018  . PAF (paroxysmal atrial fibrillation) ([place]) 07/28/2018  . Atrial flutter with rapid ventricular response ([place])   . GERD (gastroesophageal reflux disease) 06/26/2018  . Anticoagulated 05/13/2018  . Pancytopenia due to chemotherapy ([place]) 05/13/2018  . Cardiomyopathy- etiology not yet determined 05/13/2018  . Tachycardia 05/01/2018  . Low serum thyroid stimulating hormone (TSH) 05/01/2018  . Anemia 05/01/2018  . Atrial flutter ([place]) 05/01/2018  . Itching due to drug 12/19/2017  . Excessive lacrimation 11/07/2017  . Chronic fatigue 05/08/2017  . Encounter for antineoplastic immunotherapy 05/08/2017  . Goals of care, counseling/discussion 05/08/2017  . Rectal polyp 02/28/2017  . Encounter for antineoplastic chemotherapy 12/20/2015  . Herpes zoster 12/06/2015  . Cancer of lower lobe of left lung ([place]) 10/28/2015  . Lung  nodule < 6cm on CT   . Thoracic aorta atherosclerosis ([place]) 10/08/2015  . Advanced care planning/counseling discussion 08/27/2015  . Health care maintenance 01/29/2014  . Polycythemia 01/25/2014  . HLD (hyperlipidemia) 01/25/2014  . Medicare annual wellness visit, subsequent 11/22/2012  . H/O seasonal allergies   . Smoker   . CAD (coronary artery disease)   . ED  (erectile dysfunction)   . Prostate cancer ([place]) 08/02/2011  . HTN (hypertension) 06/26/2011  . COPD (chronic obstructive pulmonary disease) ([place]) 06/26/2011    Past Surgical History:  Procedure Laterality Date  . COLONOSCOPY W/ BIOPSIES AND POLYPECTOMY  10/11/15   mult TAs, 3cm rectal polypoid lesion; rec further treatment, diverticulosis ([place])  . ENDOBRONCHIAL ULTRASOUND Bilateral 10/18/2015   Procedure: ENDOBRONCHIAL ULTRASOUND;  Surgeon: Javier Glazier, MD;  Location: [facility];  Service: Cardiopulmonary;  Laterality: Bilateral;  . HERNIA REPAIR Right 2005   with mesh  . INSERTION PROSTATE RADIATION SEED  08/2011   prostate cancer, Tannenbaum  . IR FLUORO GUIDE PORT INSERTION RIGHT  07/26/2017  . IR US GUIDE VASC ACCESS RIGHT  07/26/2017  . LEFT HEART CATH AND CORONARY ANGIOGRAPHY N/A 08/05/2018   Procedure: LEFT HEART CATH AND CORONARY ANGIOGRAPHY;  Surgeon: Nelva Bush, MD;  Location: [place] CV LAB;  Service: Cardiovascular;  Laterality: N/A;    Prior to Admission medications   Medication Sig Start Date End Date Taking? Authorizing Provider  apixaban (ELIQUIS) 5 MG TABS tablet Take 1 tablet (5 mg total) by mouth 2 (two) times daily. 06/05/18  Yes Camnitz, Will Hassell Done, MD  folic acid (FOLVITE) 1 MG tablet TAKE 1 TABLET(1 MG) BY MOUTH DAILY Patient taking differently: Take 1 mg by mouth daily.  10/02/18  Yes [name], MD  losartan (COZAAR) 25 MG tablet Take 1 tablet (25 mg total) by mouth daily. 06/05/18  Yes Lelon Perla, MD  mirtazapine (REMERON) 15 MG tablet TAKE 1 TABLET(15 MG) BY MOUTH AT BEDTIME Patient taking differently: Take 15 mg by mouth at bedtime.  10/16/18  Yes [name], MD  potassium chloride SA (K-DUR,KLOR-CON) 20 MEQ tablet Take 1 tablet (20 mEq total) by mouth daily. 10/08/18  Yes [name], MD  spironolactone (ALDACTONE) 25 MG tablet Take 0.5 tablets (12.5 mg total) by mouth daily. 08/08/18  Yes Purohit, Konrad Dolores, MD   acetaminophen (TYLENOL) 500 MG tablet Take 1,000 mg by mouth every 6 (six) hours as needed (for pain.).    [provider]  amiodarone (PACERONE) 200 MG tablet Take 1 tablet (200 mg total) by mouth daily for 30 days. 08/23/18 09/22/18  Lelon Perla, MD  budesonide-formoterol (SYMBICORT) 160-4.5 MCG/ACT inhaler Inhale 1 puff into the lungs 2 (two) times daily. 02/28/17   [name], MD  cetirizine (ZYRTEC) 10 MG chewable tablet Chew 10 mg by mouth daily as needed (for seasonal allergies).  02/19/17   [provider]  dexamethasone (DECADRON) 4 MG tablet TAKE 2 TABLET BY MOUTH TWICE DAILY THE DAY BEFORE, DAY OF AND AFTER THE CHEMOTHERAPY EVERY 3 WEEKS 07/19/18   [name], MD  furosemide (LASIX) 40 MG tablet Take 1 tablet (40 mg total) by mouth 2 (two) times daily. 10/08/18   [name], MD  guaiFENesin (MUCINEX) 600 MG 12 hr tablet Take 600 mg by mouth 2 (two) times daily as needed (for cough/congestion).     [provider]  levalbuterol Penne Lash HFA) 45 MCG/ACT inhaler Inhale 2 puffs into the lungs every 4 (four) hours as needed for wheezing. 08/08/18  08/08/19  Purohit, Konrad Dolores, MD  lidocaine-prilocaine (EMLA) cream Apply 1 application topically as needed. Squeeze a  small amount of cream to cotton ball and apply to port site 1-2 hours prior to chemotherapy. Do not rub in the cream and cover  with plastic wrap. 07/30/17   [name], MD  metoprolol succinate (TOPROL XL) 25 MG 24 hr tablet Take 1 tablet (25 mg total) by mouth daily. 08/23/18   Lelon Perla, MD  omeprazole (PRILOSEC) 20 MG capsule Take 1 capsule (20 mg total) by mouth daily. 06/26/18   Maryanna Shape, NP  prochlorperazine (COMPAZINE) 10 MG tablet Take 1 tablet (10 mg total) by mouth every 6 (six) hours as needed for nausea or vomiting. Patient not taking: Reported on 09/04/2018 07/25/18   [name], MD  triamcinolone cream (KENALOG) 0.5 % Apply 1 application topically 3 (three)  times daily. 07/25/18   Tanner, Lyndon Code., PA-C    Allergies Onion; Cyramza [ramucirumab]; and Taxotere [docetaxel]  Family History  Problem Relation Age of Onset  . Asthma Father   . COPD Father   . CAD Father        possibly  . Emphysema Father   . Breast cancer Sister   . Atrial fibrillation Sister   . Hypertension Mother   . Diabetes Neg Hx   . Stroke Neg Hx   . Colon cancer Neg Hx   . Colon polyps Neg Hx   . Rectal cancer Neg Hx   . Stomach cancer Neg Hx     Social History Social History   Tobacco Use  . Smoking status: Heavy Tobacco Smoker    Packs/day: 0.67    Years: 54.00    Pack years: 36.18    Types: Cigarettes    Start date: 01/16/1967    Last attempt to quit: 11/06/2015    Years since quitting: 3.0  . Smokeless tobacco: Never Used  . Tobacco comment: Peak rate of 2ppd; quit on Chantix but started again   Substance Use Topics  . Alcohol use: Yes    Alcohol/week: 1.0 standard drinks    Types: 1 Shots of liquor per week    Comment: 2-3 beers or mixed drinks daily    . Drug use: Yes    Types: Marijuana    Comment: marijuana occasional weekly    Review of Systems Constitutional: No fever/chills Eyes: No visual changes. ENT: No sore throat. No stiff neck no neck pain Cardiovascular: Denies chest pain. Respiratory: + shortness of breath. Gastrointestinal:   no vomiting.  No diarrhea.  No constipation. Genitourinary: Negative for dysuria. Musculoskeletal: Negative lower extremity swelling Skin: Negative for rash. Neurological: Negative for severe headaches, focal weakness or numbness.   ____________________________________________   PHYSICAL EXAM:  VITAL SIGNS: ED Triage Vitals  Enc Vitals Group     BP      Pulse      Resp      Temp      Temp src      SpO2      Weight      Height      Head Circumference      Peak Flow      Pain Score      Pain Loc      Pain Edu?      Excl. in [place]?     Constitutional: Alert and oriented.  [place]  ill-appearing, with a recurrent cough, short of breath. Eyes: Conjunctivae are normal Head: Atraumatic HEENT: No congestion/rhinnorhea.  Mucous membranes are moist.  Oropharynx non-erythematous Neck:   Nontender with no meningismus, no masses, no stridor Cardiovascular: Tachycardia noted, regular rhythm. Grossly normal heart sounds.  Good peripheral circulation. Respiratory: Creased respiratory effort.  No retractions.  Diminished in the left base worse breath sounds occasional cough slight wheeze creased work of breathing Abdominal: Soft and nontender. No distention. No guarding no rebound Back:  There is no focal tenderness or step off.  there is no midline tenderness there are no lesions noted. there is no CVA tenderness Musculoskeletal: No lower extremity tenderness, no upper extremity tenderness. No joint effusions, no DVT signs strong distal pulses no edema Neurologic:  Normal speech and language. No gross focal neurologic deficits are appreciated.  Skin:  Skin is warm, dry and intact. No rash noted. Psychiatric: Mood and affect are normal. Speech and behavior are normal.  ____________________________________________   LABS (all labs ordered are listed, but only abnormal results are displayed)  Labs Reviewed  CULTURE, BLOOD (ROUTINE X 2)  CULTURE, BLOOD (ROUTINE X 2)  SARS CORONAVIRUS 2 (HOSPITAL ORDER, [place] [facility])  CBC WITH DIFFERENTIAL/PLATELET  COMPREHENSIVE METABOLIC PANEL  TROPONIN I  LACTIC ACID, PLASMA  LACTIC ACID, PLASMA  BLOOD GAS, VENOUS  CBG MONITORING, ED    Pertinent labs  results that were available during my care of the patient were reviewed by me and considered in my medical decision making (see chart for details). ____________________________________________  EKG  I personally interpreted any EKGs ordered by me or triage  ____________________________________________  RADIOLOGY  Pertinent labs & imaging results that were  available during my care of the patient were reviewed by me and considered in my medical decision making (see chart for details). If possible, patient and/or family made aware of any abnormal findings.  No results found. ____________________________________________    PROCEDURES  Procedure(s) performed: None  Procedures  Critical Care performed: CRITICAL CARE Performed by: Schuyler Amor   Total critical care time: 38 minutes  Critical care time was exclusive of separately billable procedures and treating other patients.  Critical care was necessary to treat or prevent imminent or life-threatening deterioration.  Critical care was time spent personally by me on the following activities: development of treatment plan with patient and/or surrogate as well as nursing, discussions with consultants, evaluation of patient's response to treatment, examination of patient, obtaining history from patient or surrogate, ordering and performing treatments and interventions, ordering and review of laboratory studies, ordering and review of radiographic studies, pulse oximetry and re-evaluation of patient's condition.   ____________________________________________   INITIAL IMPRESSION / ASSESSMENT AND PLAN / ED COURSE  Pertinent labs & imaging results that were available during my care of the patient were reviewed by me and considered in my medical decision making (see chart for details).  Patient with multiple different pathologies present in his lungs including lung cancer and very significant disease demonstrated on CT scan yesterday complains of shortness of breath.  Does not want to be intubated.  Given coronavirus, we are limiting our nebulizer treatment, however we will give him Solu-Medrol, albuterol, and obtain chest x-ray cardiac enzymes continue monitoring give supplemental oxygen as needed and reassess closely.  We will also check  coronavirus.   ----------------------------------------- 9:05 PM on 11/21/2018 -----------------------------------------  Talked to wife she confirms dnr dni status. Has not had infusions in several months because he is not strong enuogh to have chemo.  Patient does not want BiPAP, he essentially was about to call hospice  it is not clear why they brought him in tonight.  He has not yet been set up with hospice.  He refuses most interventions he does agree to some fluids and because his pressure is low and he agrees to antibiotics.  His strong preference and his wife strong preference is to get him home if possible but again they wanted to be comfortable.  I am calling hospice to see if that can be arranged.  In the meantime we will watch him.  He is not having any pain and does not have any evident evidence of air hunger although he is working a little bit to breathe he refuses BiPAP.  Family understand he likely will die very soon  Blood pressure 93/78, pulse 94, temperature 98.8 F (37.1 C), temperature source Oral, resp. rate 13, height 5\' 11"  (1.803 m), weight 72.6 kg, SpO2 (!) 87 %.  ----------------------------------------- 9:42 PM on 11/21/2018 -----------------------------------------  Patient will be given here a small dose of ketamine to make sure that he does not have air hunger, he is at the end of his life most likely.  He and family do not want further interventions.  They do agree to admission for comfort measures which I think is not a bad idea.  I have talked to the hospitalist service, they agree with management will admit I have tried to page the hospitalist service but have not yet gotten a call back from them.  We will focus on keeping this patient comfortable at the end of his life which is what he wants very clearly which is been stated by him and his wife.   ____________________________________________   FINAL CLINICAL IMPRESSION(S) / ED DIAGNOSES  Final diagnoses:   Cough      This chart was dictated using voice recognition software.  Despite best efforts to proofread,  errors can occur which can change meaning.      Schuyler Amor, MD 11/21/18 2011    Schuyler Amor, MD 11/21/18 2112    Schuyler Amor, MD 11/21/18 [phone]

## 2018-11-21 NOTE — ED Notes (Signed)
Patient placed on 6L [place] as he is refusing BiPAP

## 2018-11-21 NOTE — Progress Notes (Deleted)
Virtual Visit via Video Note   This visit type was conducted due to national recommendations for restrictions regarding the COVID-19 Pandemic (e.g. social distancing) in an effort to limit this patient's exposure and mitigate transmission in our community.  Due to his co-morbid illnesses, this patient is at least at moderate risk for complications without adequate follow up.  This format is felt to be most appropriate for this patient at this time.  All issues noted in this document were discussed and addressed.  A limited physical exam was performed with this format.  Please refer to the patient's chart for his consent to telehealth for [facility].   Date:  11/21/2018   ID:  Brian White, DOB 12-28-1950, MRN [id number]  Patient Location: Home Provider Location: Home  PCP:  [name], MD  Cardiologist:  Kirk Ruths, MD  Electrophysiologist:  Constance Haw, MD   Evaluation Performed:  Follow-Up Visit  Chief Complaint:  FU atrial flutter  History of Present Illness:    FU atrial flutter; patient seen in October 2019 with new onset typical atrial flutter.  Echocardiogram showed ejection fraction 25 to 30%, mild mitral regurgitation and mild left atrial enlargement. Cardiomyopathy felt potentially to be tachycardia mediated. He converted spontaneously to sinus rhythm. He was referred to electrophysiology for ablation.  He returned in January with recurrent atrial flutter.  While in the hospital he had sustained monomorphic ventricular tachycardia and was pulseless by report.  He was defibrillated.  Echocardiogram was repeated and showed an ejection fraction of 25% and moderate left atrial enlargement.  Cardiac catheterization revealed moderate diffuse circumflex disease with severe diffuse disease involving third marginal.  No other obstructive disease noted.  Medical therapy recommended.  Cardiomyopathy felt out of proportion to coronary artery disease.  Patient treated with  amiodarone for ventricular tachycardia.  Note patient also had a CT in January 2020 that showed probable progression of his lung cancer.  Since last seen   The patient does not have symptoms concerning for COVID-19 infection (fever, chills, cough, or new shortness of breath).    Past Medical History:  Diagnosis Date  . Adenocarcinoma, lung ([place]) 10/08/2015   2.4cm spiculated LLL nodule suspicious for primary bronchogenic carcinoma by CT 09/2015 - adenocarcinoma by endobronchial biopsy  . Allergy   . Arthritis   . CAD (coronary artery disease)    mild MI; pt unaware - seen on stress test  . COPD (chronic obstructive pulmonary disease) ([place]) 06/26/2011   Mild centrilobular and paraseptal emphysema by CT 09/2015   . ED (erectile dysfunction)   . H/O anaphylactic shock 2013   hives,hypotension,syncope  . H/O seasonal allergies   . Herpes zoster 12/06/2015  . Hypertension   . Polycythemia 01/25/2014  . Prostate cancer [facility]) 2013   s/p radiation therapy March 2013, planned f/u with uro after XRT  . Radiation 09/18/2011 through 11/10/2011   Prostate 7600 cGy 40 sessions, seminal vesicles 5600 cGy 40 sessions    . Radiation 11/10/15-12/22/15   chest 60 Gy  . Shortness of breath dyspnea   . Smoker   . Thoracic aorta atherosclerosis ([place]) 10/08/2015   Mild by CT 09/2015    Past Surgical History:  Procedure Laterality Date  . COLONOSCOPY W/ BIOPSIES AND POLYPECTOMY  10/11/15   mult TAs, 3cm rectal polypoid lesion; rec further treatment, diverticulosis ([place])  . ENDOBRONCHIAL ULTRASOUND Bilateral 10/18/2015   Procedure: ENDOBRONCHIAL ULTRASOUND;  Surgeon: Javier Glazier, MD;  [facility]: [facility];  Service: Cardiopulmonary;  Laterality:  Bilateral;  . HERNIA REPAIR Right 2005   with mesh  . INSERTION PROSTATE RADIATION SEED  08/2011   prostate cancer, Tannenbaum  . IR FLUORO GUIDE PORT INSERTION RIGHT  07/26/2017  . IR US GUIDE VASC ACCESS RIGHT  07/26/2017  . LEFT  HEART CATH AND CORONARY ANGIOGRAPHY N/A 08/05/2018   Procedure: LEFT HEART CATH AND CORONARY ANGIOGRAPHY;  Surgeon: Nelva Bush, MD;  Location: [place] CV LAB;  Service: Cardiovascular;  Laterality: N/A;     No outpatient medications have been marked as taking for the 11/22/18 encounter (Appointment) with Lelon Perla, MD.     Allergies:   Onion; Cyramza [ramucirumab]; and Taxotere [docetaxel]   Social History   Tobacco Use  . Smoking status: Heavy Tobacco Smoker    Packs/day: 0.67    Years: 54.00    Pack years: 36.18    Types: Cigarettes    Start date: 01/16/1967    Last attempt to quit: 11/06/2015    Years since quitting: 3.0  . Smokeless tobacco: Never Used  . Tobacco comment: Peak rate of 2ppd; quit on Chantix but started again   Substance Use Topics  . Alcohol use: Yes    Alcohol/week: 1.0 standard drinks    Types: 1 Shots of liquor per week    Comment: 2-3 beers or mixed drinks daily    . Drug use: Yes    Types: Marijuana    Comment: marijuana occasional weekly     Family Hx: The patient's family history includes Asthma in his father; Atrial fibrillation in his sister; Breast cancer in his sister; CAD in his father; COPD in his father; Emphysema in his father; Hypertension in his mother. There is no history of Diabetes, Stroke, Colon cancer, Colon polyps, Rectal cancer, or Stomach cancer.  ROS:   Please see the history of present illness.    No fevers, chills or productive cough. All other systems reviewed and are negative.   Recent Labs: 08/05/2018: Magnesium 2.2 11/20/2018: ALT 11; BUN 18; Creatinine 1.22; Hemoglobin 12.0; Platelet Count 275; Potassium 4.3; Sodium 135; TSH 1.253   Recent Lipid Panel Lab Results  Component Value Date/Time   CHOL 202 (H) 08/27/2015 09:46 AM   TRIG 91.0 08/27/2015 09:46 AM   HDL 60.00 08/27/2015 09:46 AM   CHOLHDL 3 08/27/2015 09:46 AM   LDLCALC 123 (H) 08/27/2015 09:46 AM   LDLDIRECT 100.0 02/28/2017 03:44 PM    Wt  Readings from Last 3 Encounters:  09/04/18 177 lb 4.8 oz (80.4 kg)  08/30/18 181 lb 9.6 oz (82.4 kg)  08/23/18 180 lb 12.8 oz (82 kg)     Objective:    Vital Signs:  There were no vitals taken for this visit.   VITAL SIGNS:  reviewed  No acute distress Answers questions appropriately Normal affect Remainder physical examination not performed (telehealth visit; coronavirus pandemic)  ASSESSMENT & PLAN:    1. Coronary artery disease-patient has not had recurrent chest pain.  Patient not on aspirin given need for anticoagulation. 2. Cardiomyopathy-patient has reduced LV function out of proportion to coronary disease.  We are treating medically.  Continue ARB and Toprol.  He is not a candidate for ICD given metastatic lung cancer. 3. Chronic systolic congestive heart failure-volume status stable by history.  Plan to continue present dose of diuretic.  Continue fluid restriction and low-sodium diet. 4. Ventricular tachycardia-continue amiodarone at 200 mg daily. 5. Atrial flutter-by history he has not had recurrence.  Continue amiodarone.  Continue apixaban. 6. Tobacco  abuse-patient counseled on discontinuing. 7. Non-small cell lung cancer-Per oncology.  CT scan yesterday showed soft tissue mass around the left hilum, mainstem bronchus and carina which was enlarged compared to previous.  Large left pleural effusion.  Also with increased adenopathy.  COVID-19 Education: The signs and symptoms of COVID-19 were discussed with the patient and how to seek care for testing (follow up with PCP or arrange E-visit).  ***The importance of social distancing was discussed today.  Time:   Today, I have spent *** minutes with the patient with telehealth technology discussing the above problems.     Medication Adjustments/Labs and Tests Ordered: Current medicines are reviewed at length with the patient today.  Concerns regarding medicines are outlined above.   Tests Ordered: No orders of the defined  types were placed in this encounter.   Medication Changes: No orders of the defined types were placed in this encounter.   Disposition:  Follow up {follow up:15908}  Signed, Kirk Ruths, MD  11/21/2018 12:53 PM    [facility]

## 2018-11-21 NOTE — ED Notes (Signed)
Family at bedside. 

## 2018-11-21 NOTE — Progress Notes (Signed)
Family Meeting Note  Advance Directive:no  Today a meeting took place with the Patient.  Patient is able to participate.  The following clinical team members were present during this meeting:MD  The following were discussed:Patient's diagnosis: lung cancer, Patient's progosis: < 2 weeks and Goals for treatment: DNR   Patient states that he just wants to be comfortable at this point. He wants to stop feeling so short of breath. He does not want any aggressive medical intervention. He would like to be DNR/DNI and placed on full comfort meds. Palliative orders placed.  Additional follow-up to be provided: prn  Time spent during discussion:20 minutes  Evette Doffing, MD

## 2018-11-21 NOTE — ED Notes (Signed)
MD informed of BP. Per MD, ok to give meds. Patient sats 97%

## 2018-11-21 NOTE — ED Notes (Signed)
Attempted to reach wife, called x3 and left message. She can come in to see patient due to his declining status

## 2018-11-21 NOTE — ED Notes (Signed)
Date and time results received: 11/21/18 2040 (use smartphrase ".now" to insert current time)  Test: lactic acid Critical Value: 2.2  Name of Provider Notified: McShane  Orders Received? Or Actions Taken?: Orders Received - See Orders for details

## 2018-11-21 NOTE — ED Notes (Signed)
Attempted to contact wife, will attempt again  Brian White [phone]

## 2018-11-21 NOTE — ED Notes (Signed)
Patient refuses BiPAP, refusing to lay in bed. MD on phone with family.

## 2018-11-22 ENCOUNTER — Telehealth: Payer: Self-pay | Admitting: *Deleted

## 2018-11-22 ENCOUNTER — Telehealth: Payer: BLUE CROSS/BLUE SHIELD | Admitting: Cardiology

## 2018-11-22 DIAGNOSIS — I110 Hypertensive heart disease with heart failure: Secondary | ICD-10-CM | POA: Diagnosis present

## 2018-11-22 DIAGNOSIS — C3492 Malignant neoplasm of unspecified part of left bronchus or lung: Secondary | ICD-10-CM

## 2018-11-22 DIAGNOSIS — Z79899 Other long term (current) drug therapy: Secondary | ICD-10-CM | POA: Diagnosis not present

## 2018-11-22 DIAGNOSIS — J439 Emphysema, unspecified: Secondary | ICD-10-CM | POA: Diagnosis present

## 2018-11-22 DIAGNOSIS — J9690 Respiratory failure, unspecified, unspecified whether with hypoxia or hypercapnia: Secondary | ICD-10-CM | POA: Diagnosis not present

## 2018-11-22 DIAGNOSIS — I4892 Unspecified atrial flutter: Secondary | ICD-10-CM | POA: Diagnosis present

## 2018-11-22 DIAGNOSIS — J9811 Atelectasis: Secondary | ICD-10-CM | POA: Diagnosis present

## 2018-11-22 DIAGNOSIS — J9809 Other diseases of bronchus, not elsewhere classified: Secondary | ICD-10-CM | POA: Diagnosis present

## 2018-11-22 DIAGNOSIS — F419 Anxiety disorder, unspecified: Secondary | ICD-10-CM | POA: Diagnosis present

## 2018-11-22 DIAGNOSIS — I480 Paroxysmal atrial fibrillation: Secondary | ICD-10-CM | POA: Diagnosis present

## 2018-11-22 DIAGNOSIS — I2510 Atherosclerotic heart disease of native coronary artery without angina pectoris: Secondary | ICD-10-CM | POA: Diagnosis present

## 2018-11-22 DIAGNOSIS — R05 Cough: Secondary | ICD-10-CM

## 2018-11-22 DIAGNOSIS — I5042 Chronic combined systolic (congestive) and diastolic (congestive) heart failure: Secondary | ICD-10-CM | POA: Diagnosis present

## 2018-11-22 DIAGNOSIS — Z20828 Contact with and (suspected) exposure to other viral communicable diseases: Secondary | ICD-10-CM | POA: Diagnosis present

## 2018-11-22 DIAGNOSIS — I5043 Acute on chronic combined systolic (congestive) and diastolic (congestive) heart failure: Secondary | ICD-10-CM

## 2018-11-22 DIAGNOSIS — Z515 Encounter for palliative care: Secondary | ICD-10-CM | POA: Diagnosis present

## 2018-11-22 DIAGNOSIS — C3402 Malignant neoplasm of left main bronchus: Secondary | ICD-10-CM | POA: Diagnosis present

## 2018-11-22 DIAGNOSIS — K219 Gastro-esophageal reflux disease without esophagitis: Secondary | ICD-10-CM | POA: Diagnosis present

## 2018-11-22 DIAGNOSIS — Z7901 Long term (current) use of anticoagulants: Secondary | ICD-10-CM | POA: Diagnosis not present

## 2018-11-22 DIAGNOSIS — J438 Other emphysema: Secondary | ICD-10-CM

## 2018-11-22 DIAGNOSIS — Z8546 Personal history of malignant neoplasm of prostate: Secondary | ICD-10-CM | POA: Diagnosis not present

## 2018-11-22 DIAGNOSIS — Z66 Do not resuscitate: Secondary | ICD-10-CM | POA: Diagnosis present

## 2018-11-22 DIAGNOSIS — Z7951 Long term (current) use of inhaled steroids: Secondary | ICD-10-CM | POA: Diagnosis not present

## 2018-11-22 DIAGNOSIS — C3490 Malignant neoplasm of unspecified part of unspecified bronchus or lung: Secondary | ICD-10-CM | POA: Diagnosis present

## 2018-11-22 DIAGNOSIS — Z9221 Personal history of antineoplastic chemotherapy: Secondary | ICD-10-CM | POA: Diagnosis not present

## 2018-11-22 DIAGNOSIS — D751 Secondary polycythemia: Secondary | ICD-10-CM

## 2018-11-22 DIAGNOSIS — Z8249 Family history of ischemic heart disease and other diseases of the circulatory system: Secondary | ICD-10-CM | POA: Diagnosis not present

## 2018-11-22 DIAGNOSIS — I959 Hypotension, unspecified: Secondary | ICD-10-CM | POA: Diagnosis present

## 2018-11-22 DIAGNOSIS — Z825 Family history of asthma and other chronic lower respiratory diseases: Secondary | ICD-10-CM | POA: Diagnosis not present

## 2018-11-22 DIAGNOSIS — F17210 Nicotine dependence, cigarettes, uncomplicated: Secondary | ICD-10-CM | POA: Diagnosis present

## 2018-11-22 DIAGNOSIS — Z803 Family history of malignant neoplasm of breast: Secondary | ICD-10-CM | POA: Diagnosis not present

## 2018-11-22 MED ORDER — BIOTENE DRY MOUTH MT LIQD: 15.0000 mL | OROMUCOSAL | Status: DC | PRN

## 2018-11-22 MED ORDER — HALOPERIDOL LACTATE 2 MG/ML PO CONC
0.5000 mg | ORAL | Status: DC | PRN
  Filled 2018-11-22: qty 0.3

## 2018-11-22 MED ORDER — ACETAMINOPHEN 325 MG PO TABS: 650.0000 mg | ORAL_TABLET | Freq: Four times a day (QID) | ORAL | Status: DC | PRN

## 2018-11-22 MED ORDER — METHYLPREDNISOLONE SODIUM SUCC 125 MG IJ SOLR
60.0000 mg | Freq: Three times a day (TID) | INTRAMUSCULAR | Status: DC
  Administered 2018-11-22 (×2): 60 mg via INTRAVENOUS
  Filled 2018-11-22 (×2): qty 2

## 2018-11-22 MED ORDER — IPRATROPIUM-ALBUTEROL 0.5-2.5 (3) MG/3ML IN SOLN: 3.0000 mL | Freq: Four times a day (QID) | RESPIRATORY_TRACT | 1 refills | Status: AC

## 2018-11-22 MED ORDER — HALOPERIDOL 0.5 MG PO TABS
0.5000 mg | ORAL_TABLET | ORAL | Status: DC | PRN
  Filled 2018-11-22: qty 1

## 2018-11-22 MED ORDER — LORAZEPAM 1 MG PO TABS: 1.0000 mg | ORAL_TABLET | ORAL | 0 refills | Status: DC | PRN

## 2018-11-22 MED ORDER — HALOPERIDOL LACTATE 5 MG/ML IJ SOLN: 0.5000 mg | INTRAMUSCULAR | Status: DC | PRN

## 2018-11-22 MED ORDER — POLYVINYL ALCOHOL 1.4 % OP SOLN
1.0000 [drp] | Freq: Four times a day (QID) | OPHTHALMIC | Status: DC | PRN
  Filled 2018-11-22: qty 15

## 2018-11-22 MED ORDER — LORAZEPAM 2 MG/ML PO CONC: 1.0000 mg | ORAL | Status: DC | PRN

## 2018-11-22 MED ORDER — ONDANSETRON 4 MG PO TBDP
4.0000 mg | ORAL_TABLET | Freq: Four times a day (QID) | ORAL | Status: DC | PRN
  Filled 2018-11-22: qty 1

## 2018-11-22 MED ORDER — SODIUM CHLORIDE 0.9 % IV SOLN: 250.0000 mL | INTRAVENOUS | Status: DC | PRN

## 2018-11-22 MED ORDER — LORAZEPAM 1 MG PO TABS: 1.0000 mg | ORAL_TABLET | ORAL | Status: DC | PRN

## 2018-11-22 MED ORDER — FOLIC ACID 1 MG PO TABS
1.0000 mg | ORAL_TABLET | Freq: Every day | ORAL | Status: DC
  Administered 2018-11-22: 12:00:00 1 mg via ORAL
  Filled 2018-11-22: qty 1

## 2018-11-22 MED ORDER — IPRATROPIUM-ALBUTEROL 0.5-2.5 (3) MG/3ML IN SOLN
3.0000 mL | Freq: Four times a day (QID) | RESPIRATORY_TRACT | Status: DC
  Administered 2018-11-22 (×2): 3 mL via RESPIRATORY_TRACT
  Filled 2018-11-22 (×2): qty 3

## 2018-11-22 MED ORDER — LORAZEPAM 2 MG/ML IJ SOLN: 1.0000 mg | INTRAMUSCULAR | Status: DC | PRN

## 2018-11-22 MED ORDER — MORPHINE SULFATE (CONCENTRATE) 10 MG/0.5ML PO SOLN: 5.0000 mg | ORAL | 0 refills | Status: DC | PRN

## 2018-11-22 MED ORDER — LORAZEPAM 1 MG PO TABS: 1.0000 mg | ORAL_TABLET | ORAL | 0 refills | Status: AC | PRN

## 2018-11-22 MED ORDER — HALOPERIDOL LACTATE 2 MG/ML PO CONC: 0.5000 mg | ORAL | Status: DC | PRN

## 2018-11-22 MED ORDER — GLYCOPYRROLATE 1 MG PO TABS
1.0000 mg | ORAL_TABLET | ORAL | Status: DC | PRN
  Filled 2018-11-22: qty 1

## 2018-11-22 MED ORDER — GLYCOPYRROLATE 0.2 MG/ML IJ SOLN
0.2000 mg | INTRAMUSCULAR | Status: DC | PRN
  Filled 2018-11-22: qty 1

## 2018-11-22 MED ORDER — FOLIC ACID 5 MG/ML IJ SOLN
1.0000 mg | Freq: Every day | INTRAMUSCULAR | Status: DC
  Filled 2018-11-22: qty 0.2

## 2018-11-22 MED ORDER — DIPHENHYDRAMINE HCL 50 MG/ML IJ SOLN: 12.5000 mg | INTRAMUSCULAR | Status: DC | PRN

## 2018-11-22 MED ORDER — HEPARIN SOD (PORK) LOCK FLUSH 100 UNIT/ML IV SOLN
500.0000 [IU] | Freq: Once | INTRAVENOUS | Status: AC
  Administered 2018-11-22: 14:00:00 500 [IU] via INTRAVENOUS
  Filled 2018-11-22: qty 5

## 2018-11-22 MED ORDER — ACETAMINOPHEN 650 MG RE SUPP: 650.0000 mg | Freq: Four times a day (QID) | RECTAL | Status: DC | PRN

## 2018-11-22 MED ORDER — MORPHINE SULFATE (CONCENTRATE) 10 MG/0.5ML PO SOLN: 5.0000 mg | ORAL | 0 refills | Status: AC | PRN

## 2018-11-22 MED ORDER — SODIUM CHLORIDE 0.9% FLUSH: 3.0000 mL | Freq: Two times a day (BID) | INTRAVENOUS | Status: DC

## 2018-11-22 MED ORDER — ONDANSETRON HCL 4 MG/2ML IJ SOLN: 4.0000 mg | Freq: Four times a day (QID) | INTRAMUSCULAR | Status: DC | PRN

## 2018-11-22 MED ORDER — SODIUM CHLORIDE 0.9% FLUSH: 3.0000 mL | INTRAVENOUS | Status: DC | PRN

## 2018-11-22 MED ORDER — HALOPERIDOL 0.5 MG PO TABS: 0.5000 mg | ORAL_TABLET | ORAL | Status: DC | PRN

## 2018-11-22 MED ORDER — ALBUTEROL SULFATE (2.5 MG/3ML) 0.083% IN NEBU: 2.5000 mg | INHALATION_SOLUTION | RESPIRATORY_TRACT | 12 refills | Status: AC | PRN

## 2018-11-22 MED ORDER — THIAMINE HCL 100 MG/ML IJ SOLN: 100.0000 mg | Freq: Every day | INTRAMUSCULAR | Status: DC

## 2018-11-22 MED ORDER — MORPHINE SULFATE (CONCENTRATE) 10 MG/0.5ML PO SOLN
5.0000 mg | ORAL | Status: DC | PRN
  Administered 2018-11-22: 13:00:00 5 mg via ORAL
  Filled 2018-11-22: qty 1

## 2018-11-22 NOTE — H&P (Signed)
[facility] [place] NAME: Brian White    MR#:  [id number]  DATE OF BIRTH:  February 13, 1951  DATE OF ADMISSION:  11/21/2018  PRIMARY CARE PHYSICIAN: [name], MD   REQUESTING/REFERRING PHYSICIAN: Charlotte Crumb, MD  CHIEF COMPLAINT:   Chief Complaint  Patient presents with  . Respiratory Distress  . Fever    HISTORY OF PRESENT ILLNESS:  Brian White  is a 68 y.o. male with a known history of adenocarcinoma of the lung, ICD, COPD.  Patient has not been a chemotherapy candidate since December 2019.  CT performed on yesterday 11/20/2018 demonstrated soft tissue mass on the left hilum mainstem bronchus and carina enlarged when compared to prior exam with complete obstruction of the left mainstem bronchus with mass possibly invading the mid esophagus with a large left pleural effusion and total postobstructive atelectasis of the left lung.  He states he was to meet with hospice care tomorrow.  However he developed increasing shortness of breath with hypotension and was brought to the emergency room for further evaluation.  Patient feels he may have had a slight fever earlier in the day.  He denies having had nausea, vomiting, diarrhea, abdominal pain.  He has a frequent hacking type cough which is nonproductive.  He denies hemoptysis.  He reports pleuritic type chest pain.  Patient is awake and alert.  He voices his wishes and that he does not want to be placed on a ventilator nor have CPR in the event of cardiac or respiratory arrest.  Patient request he be made comfortable.  He is interested in palliative care consultation as well.  He has been admitted to the hospital service for further management.  Initial palliative care measures have been initiated and consult has been requested.    PAST MEDICAL HISTORY:   Past Medical History:  Diagnosis Date  . Adenocarcinoma, lung ([place]) 10/08/2015   2.4cm spiculated LLL nodule suspicious for primary  bronchogenic carcinoma by CT 09/2015 - adenocarcinoma by endobronchial biopsy  . Allergy   . Arthritis   . CAD (coronary artery disease)    mild MI; pt unaware - seen on stress test  . COPD (chronic obstructive pulmonary disease) ([place]) 06/26/2011   Mild centrilobular and paraseptal emphysema by CT 09/2015   . ED (erectile dysfunction)   . H/O anaphylactic shock 2013   hives,hypotension,syncope  . H/O seasonal allergies   . Herpes zoster 12/06/2015  . Hypertension   . Polycythemia 01/25/2014  . Prostate cancer [facility]) 2013   s/p radiation therapy March 2013, planned f/u with uro after XRT  . Radiation 09/18/2011 through 11/10/2011   Prostate 7600 cGy 40 sessions, seminal vesicles 5600 cGy 40 sessions    . Radiation 11/10/15-12/22/15   chest 60 Gy  . Shortness of breath dyspnea   . Smoker   . Thoracic aorta atherosclerosis ([place]) 10/08/2015   Mild by CT 09/2015     PAST SURGICAL HISTORY:   Past Surgical History:  Procedure Laterality Date  . COLONOSCOPY W/ BIOPSIES AND POLYPECTOMY  10/11/15   mult TAs, 3cm rectal polypoid lesion; rec further treatment, diverticulosis ([place])  . ENDOBRONCHIAL ULTRASOUND Bilateral 10/18/2015   Procedure: ENDOBRONCHIAL ULTRASOUND;  Surgeon: Javier Glazier, MD;  Location: [facility] END[facility];  Service: Cardiopulmonary;  Laterality: Bilateral;  . HERNIA REPAIR Right 2005   with mesh  . INSERTION PROSTATE RADIATION SEED  08/2011   prostate cancer, [place]  . IR FLUORO GUIDE PORT INSERTION RIGHT  07/26/2017  .  IR US GUIDE VASC ACCESS RIGHT  07/26/2017  . LEFT HEART CATH AND CORONARY ANGIOGRAPHY N/A 08/05/2018   Procedure: LEFT HEART CATH AND CORONARY ANGIOGRAPHY;  Surgeon: Nelva Bush, MD;  Location: [facility];  Service: Cardiovascular;  Laterality: N/A;    SOCIAL HISTORY:   Social History   Tobacco Use  . Smoking status: Heavy Tobacco Smoker    Packs/day: 0.67    Years: 54.00    Pack years: 36.18    Types: Cigarettes     Start date: 01/16/1967    Last attempt to quit: 11/06/2015    Years since quitting: 3.0  . Smokeless tobacco: Never Used  . Tobacco comment: Peak rate of 2ppd; quit on Chantix but started again   Substance Use Topics  . Alcohol use: Yes    Alcohol/week: 1.0 standard drinks    Types: 1 Shots of liquor per week    Comment: 2-3 beers or mixed drinks daily      FAMILY HISTORY:   Family History  Problem Relation Age of Onset  . Asthma Father   . COPD Father   . CAD Father        possibly  . Emphysema Father   . Breast cancer Sister   . Atrial fibrillation Sister   . Hypertension Mother   . Diabetes Neg Hx   . Stroke Neg Hx   . Colon cancer Neg Hx   . Colon polyps Neg Hx   . Rectal cancer Neg Hx   . Stomach cancer Neg Hx     DRUG ALLERGIES:   Allergies  Allergen Reactions  . Onion Anaphylaxis       . Cyramza [Ramucirumab] Itching    Itching, erythema, & tearing  . Taxotere [Docetaxel] Itching    Itching, erythema, & tearing    REVIEW OF SYSTEMS:   Review of Systems  Constitutional: Positive for malaise/fatigue. Negative for chills and fever.  HENT: Negative for sinus pain and sore throat.   Eyes: Positive for blurred vision. Negative for double vision and pain.  Respiratory: Positive for cough and shortness of breath. Negative for hemoptysis and sputum production.   Cardiovascular: Negative for chest pain, palpitations and leg swelling.  Gastrointestinal: Negative for abdominal pain, constipation, diarrhea, nausea and vomiting.  Genitourinary: Negative for dysuria, flank pain, frequency and urgency.  Musculoskeletal: Negative for falls and myalgias.  Skin: Negative for itching and rash.  Neurological: Positive for weakness. Negative for dizziness and loss of consciousness.  Psychiatric/Behavioral: Negative.      MEDICATIONS AT HOME:   Prior to Admission medications   Medication Sig Start Date End Date Taking? Authorizing Provider  apixaban (ELIQUIS) 5 MG TABS  tablet Take 1 tablet (5 mg total) by mouth 2 (two) times daily. 06/05/18  Yes Camnitz, Will Hassell Done, MD  folic acid (FOLVITE) 1 MG tablet TAKE 1 TABLET(1 MG) BY MOUTH DAILY Patient taking differently: Take 1 mg by mouth daily.  10/02/18  Yes [name], MD  losartan (COZAAR) 25 MG tablet Take 1 tablet (25 mg total) by mouth daily. 06/05/18  Yes Lelon Perla, MD  mirtazapine (REMERON) 15 MG tablet TAKE 1 TABLET(15 MG) BY MOUTH AT BEDTIME Patient taking differently: Take 15 mg by mouth at bedtime.  10/16/18  Yes [name], MD  potassium chloride SA (K-DUR,KLOR-CON) 20 MEQ tablet Take 1 tablet (20 mEq total) by mouth daily. 10/08/18  Yes [name], MD  spironolactone (ALDACTONE) 25 MG tablet Take 0.5 tablets (12.5 mg total) by mouth  daily. 08/08/18  Yes Purohit, Konrad Dolores, MD  acetaminophen (TYLENOL) 500 MG tablet Take 1,000 mg by mouth every 6 (six) hours as needed (for pain.).    [provider]  amiodarone (PACERONE) 200 MG tablet Take 1 tablet (200 mg total) by mouth daily for 30 days. 08/23/18 09/22/18  Lelon Perla, MD  budesonide-formoterol (SYMBICORT) 160-4.5 MCG/ACT inhaler Inhale 1 puff into the lungs 2 (two) times daily. 02/28/17   [name], MD  cetirizine (ZYRTEC) 10 MG chewable tablet Chew 10 mg by mouth daily as needed (for seasonal allergies).  02/19/17   [provider]  dexamethasone (DECADRON) 4 MG tablet TAKE 2 TABLET BY MOUTH TWICE DAILY THE DAY BEFORE, DAY OF AND AFTER THE CHEMOTHERAPY EVERY 3 WEEKS 07/19/18   [name], MD  furosemide (LASIX) 40 MG tablet Take 1 tablet (40 mg total) by mouth 2 (two) times daily. 10/08/18   [name], MD  guaiFENesin (MUCINEX) 600 MG 12 hr tablet Take 600 mg by mouth 2 (two) times daily as needed (for cough/congestion).     [provider]  levalbuterol Penne Lash HFA) 45 MCG/ACT inhaler Inhale 2 puffs into the lungs every 4 (four) hours as needed for wheezing. 08/08/18 08/08/19  Purohit,  Konrad Dolores, MD  lidocaine-prilocaine (EMLA) cream Apply 1 application topically as needed. Squeeze a  small amount of cream to cotton ball and apply to port site 1-2 hours prior to chemotherapy. Do not rub in the cream and cover  with plastic wrap. 07/30/17   [name], MD  metoprolol succinate (TOPROL XL) 25 MG 24 hr tablet Take 1 tablet (25 mg total) by mouth daily. 08/23/18   Lelon Perla, MD  omeprazole (PRILOSEC) 20 MG capsule Take 1 capsule (20 mg total) by mouth daily. 06/26/18   Maryanna Shape, NP  prochlorperazine (COMPAZINE) 10 MG tablet Take 1 tablet (10 mg total) by mouth every 6 (six) hours as needed for nausea or vomiting. Patient not taking: Reported on 09/04/2018 07/25/18   [name], MD  triamcinolone cream (KENALOG) 0.5 % Apply 1 application topically 3 (three) times daily. 07/25/18   Tanner, Lyndon Code., PA-C      VITAL SIGNS:  Blood pressure (!) 81/66, pulse 64, temperature 98.8 F (37.1 C), temperature source Oral, resp. rate (!) 25, height 5\' 11"  (1.803 m), weight 72.6 kg, SpO2 97 %.  PHYSICAL EXAMINATION:  Physical Exam Vitals signs and nursing note reviewed.  Constitutional:      General: He is in acute distress.     Appearance: He is ill-appearing.  HENT:     Head: Normocephalic.     Nose: Nose normal. No rhinorrhea.     Mouth/Throat:     Mouth: Mucous membranes are moist.     Pharynx: Oropharynx is clear.  Eyes:     General: No scleral icterus.    Conjunctiva/sclera: Conjunctivae normal.     Pupils: Pupils are equal, round, and reactive to light.  Neck:     Musculoskeletal: Normal range of motion and neck supple. No muscular tenderness.  Cardiovascular:     Rate and Rhythm: Normal rate and regular rhythm.     Pulses: Normal pulses.     Heart sounds: Normal heart sounds. No murmur. No gallop.   Pulmonary:     Effort: Respiratory distress present.     Breath sounds: Wheezing and rhonchi present.  Abdominal:     General: Abdomen is flat. Bowel  sounds are normal. There is distension.  Palpations: Abdomen is soft. There is no mass.     Tenderness: There is no abdominal tenderness.  Musculoskeletal:     Comments: General weakness/ Debility  Skin:    General: Skin is warm and dry.     Capillary Refill: Capillary refill takes less than 2 seconds.     Findings: No erythema or rash.  Neurological:     General: No focal deficit present.     Mental Status: He is alert and oriented to person, place, and time.     Cranial Nerves: No cranial nerve deficit.  Psychiatric:        Behavior: Behavior normal.        Judgment: Judgment normal.       LABORATORY PANEL:   CBC Recent Labs  Lab 11/21/18 2000  WBC 15.9*  HGB 11.1*  HCT 33.3*  PLT 247   ------------------------------------------------------------------------------------------------------------------  Chemistries  Recent Labs  Lab 11/21/18 2000  NA 131*  K 4.7  CL 95*  CO2 23  GLUCOSE 162*  BUN 22  CREATININE 1.16  CALCIUM 9.5  AST 14*  ALT 13  ALKPHOS 78  BILITOT 1.1   ------------------------------------------------------------------------------------------------------------------  Cardiac Enzymes Recent Labs  Lab 11/21/18 2000  TROPONINI <0.03   ------------------------------------------------------------------------------------------------------------------  RADIOLOGY:  Ct Chest W Contrast  Result Date: 11/20/2018 CLINICAL DATA:  Follow-up lung cancer EXAM: CT CHEST, ABDOMEN, AND PELVIS WITH CONTRAST TECHNIQUE: Multidetector CT imaging of the chest, abdomen and pelvis was performed following the standard protocol during bolus administration of intravenous contrast. CONTRAST:  180mL OMNIPAQUE IOHEXOL 300 MG/ML SOLN, additional oral enteric contrast COMPARISON:  CT chest abdomen pelvis, 07/18/2018 FINDINGS: CT CHEST FINDINGS Cardiovascular: Coronary artery disease. Cardiomegaly. Calcification of the lateral wall the left ventricle in keeping with  prior infarction. Trace pericardial effusion. Mediastinum/Nodes: Redemonstrated soft tissue mass about the left hilum, mainstem bronchus and carina, enlarged compared to prior examination now with complete obstruction of the left mainstem bronchus. This mass abuts and may invade the midesophagus (series 9, image 26). Thyroid gland, trachea, and esophagus demonstrate no significant findings. Lungs/Pleura: Large left pleural effusion with total postobstructive atelectasis or consolidation of the left lung. Musculoskeletal: No chest wall mass or suspicious bone lesions identified. CT ABDOMEN PELVIS FINDINGS Hepatobiliary: No focal liver abnormality is seen. No gallstones, gallbladder wall thickening, or biliary dilatation. Pancreas: Unremarkable. No pancreatic ductal dilatation or surrounding inflammatory changes. Spleen: Normal in size without focal abnormality. Adrenals/Urinary Tract: Adrenal glands are unremarkable. Kidneys are normal, without renal calculi, focal lesion, or hydronephrosis. Bladder is unremarkable. Stomach/Bowel: Stomach is within normal limits. Appendix appears normal. No evidence of bowel wall thickening, distention, or inflammatory changes. Vascular/Lymphatic: Mixed calcific atherosclerosis. Marked interval enlargement of bulky, hypodense gastrohepatic ligament and periaortic retroperitoneal lymph nodes, largest gastrohepatic ligament node measuring 6.0 cm, previously 2.0 cm (series 9, image 57). Reproductive: Prostatomegaly with median lobe hypertrophy and biopsy clips. Other: No abdominal wall hernia or abnormality. No abdominopelvic ascites. Musculoskeletal: No acute or significant osseous findings. IMPRESSION: 1. Redemonstrated soft tissue mass about the left hilum, mainstem bronchus and carina, enlarged compared to prior examination now with complete obstruction of the left mainstem bronchus. This mass abuts and may invade the midesophagus (series 9, image 26). Large left pleural effusion  with total postobstructive atelectasis or consolidation of the left lung. 2. Marked interval enlargement of bulky, hypodense gastrohepatic ligament and periaortic retroperitoneal lymph nodes, largest gastrohepatic ligament node measuring 6.0 cm, previously 2.0 cm (series 9, image 57). 3. Findings are consistent with worsening  malignancy and metastatic disease. 4.  Chronic and incidental findings as detailed above. Electronically Signed   By: Eddie Candle M.D.   On: 11/20/2018 11:40   Ct Abdomen Pelvis W Contrast  Result Date: 11/20/2018 CLINICAL DATA:  Follow-up lung cancer EXAM: CT CHEST, ABDOMEN, AND PELVIS WITH CONTRAST TECHNIQUE: Multidetector CT imaging of the chest, abdomen and pelvis was performed following the standard protocol during bolus administration of intravenous contrast. CONTRAST:  123mL OMNIPAQUE IOHEXOL 300 MG/ML SOLN, additional oral enteric contrast COMPARISON:  CT chest abdomen pelvis, 07/18/2018 FINDINGS: CT CHEST FINDINGS Cardiovascular: Coronary artery disease. Cardiomegaly. Calcification of the lateral wall the left ventricle in keeping with prior infarction. Trace pericardial effusion. Mediastinum/Nodes: Redemonstrated soft tissue mass about the left hilum, mainstem bronchus and carina, enlarged compared to prior examination now with complete obstruction of the left mainstem bronchus. This mass abuts and may invade the midesophagus (series 9, image 26). Thyroid gland, trachea, and esophagus demonstrate no significant findings. Lungs/Pleura: Large left pleural effusion with total postobstructive atelectasis or consolidation of the left lung. Musculoskeletal: No chest wall mass or suspicious bone lesions identified. CT ABDOMEN PELVIS FINDINGS Hepatobiliary: No focal liver abnormality is seen. No gallstones, gallbladder wall thickening, or biliary dilatation. Pancreas: Unremarkable. No pancreatic ductal dilatation or surrounding inflammatory changes. Spleen: Normal in size without focal  abnormality. Adrenals/Urinary Tract: Adrenal glands are unremarkable. Kidneys are normal, without renal calculi, focal lesion, or hydronephrosis. Bladder is unremarkable. Stomach/Bowel: Stomach is within normal limits. Appendix appears normal. No evidence of bowel wall thickening, distention, or inflammatory changes. Vascular/Lymphatic: Mixed calcific atherosclerosis. Marked interval enlargement of bulky, hypodense gastrohepatic ligament and periaortic retroperitoneal lymph nodes, largest gastrohepatic ligament node measuring 6.0 cm, previously 2.0 cm (series 9, image 57). Reproductive: Prostatomegaly with median lobe hypertrophy and biopsy clips. Other: No abdominal wall hernia or abnormality. No abdominopelvic ascites. Musculoskeletal: No acute or significant osseous findings. IMPRESSION: 1. Redemonstrated soft tissue mass about the left hilum, mainstem bronchus and carina, enlarged compared to prior examination now with complete obstruction of the left mainstem bronchus. This mass abuts and may invade the midesophagus (series 9, image 26). Large left pleural effusion with total postobstructive atelectasis or consolidation of the left lung. 2. Marked interval enlargement of bulky, hypodense gastrohepatic ligament and periaortic retroperitoneal lymph nodes, largest gastrohepatic ligament node measuring 6.0 cm, previously 2.0 cm (series 9, image 57). 3. Findings are consistent with worsening malignancy and metastatic disease. 4.  Chronic and incidental findings as detailed above. Electronically Signed   By: Eddie Candle M.D.   On: 11/20/2018 11:40   Dg Chest Port 1 View  Result Date: 11/21/2018 CLINICAL DATA:  Shortness of breath, fever. History of COPD, lung cancer. Cough. EXAM: PORTABLE CHEST 1 VIEW COMPARISON:  Chest CT 11/20/2018 FINDINGS: Right Port-A-Cath is in place with the tip in the upper SVC. Complete opacification of the left hemithorax, stable since recent CT yesterday. Right lung clear. No acute  bony abnormality. IMPRESSION: Complete opacification of the left hemithorax stable since recent chest CT performed yesterday. This was shown to be related to a combination of effusion and obstruction of the left mainstem bronchus by central mass. Electronically Signed   By: Rolm Baptise M.D.   On: 11/21/2018 20:50      IMPRESSION AND PLAN:   1.  Adenocarcinoma of the lung - As patient has been decided to no longer be a chemotherapy candidate, he wishes to have comfort care measures at this time.  I have had a long conversation with  the patient concerning his end-of-life wishes.  Patient wishes to be made comfortable.  He does not want CPR or any life-saving measures including CPAP/BiPAP/or ventilator management. - Palliative care has been consulted and initial comfort measures have been addressed. - Pain will be managed with IV analgesic - Anxiety will be managed with Ativan. - Patient has O2 per nasal cannula currently  2.  COPD - Will continue DuoNeb every 6 hours and as needed - Patient was given initial Solu-Medrol in the emergency room.  We will continue every 8 hour Solu-Medrol for exacerbation. - He has O2 at 3 L/min per nasal cannula currently  3. hypotension - Patient has received IV fluid bolus in the emergency room.  However he does not wish to have vasopressor therapy.    All the records are reviewed and case discussed with ED provider. The plan of care was discussed in details with the patient (and family). I answered all questions. The patient agreed to proceed with the above mentioned plan. Further management will depend upon hospital course.   CODE STATUS: Full code  TOTAL TIME TAKING CARE OF THIS PATIENT:60 minutes.    Theo Dills Dmario Russom CRNPon 11/22/2018 at 12:59 AM  Pager - [phone]  After 6pm go to [email or web address] - Proofreader  [facility]  Office  [phone]  CC: Primary care physician; [name], MD   Note:  This dictation was prepared with Dragon dictation along with smaller phrase technology. Any transcriptional errors that result from this process are unintentional.

## 2018-11-22 NOTE — ED Notes (Signed)
.. ED TO INPATIENT HANDOFF REPORT  ED Nurse Name and Phone #: Deneise Lever [phone]  S Name/Age/Gender Brian White 68 y.o. male Room/Bed: ED06A/ED06A  Code Status   Code Status: Prior  Home/SNF/Other Home Patient oriented to: self, place, time and situation Is this baseline? Yes   Triage Complete: Triage complete  Chief Complaint fever and sob  Triage Note Patient arrives via EMS for worsening SOB, fever. Patient has COPD, CHF, lung CA, and does smoke. Patient has not been around any sick people to his knowledge   Allergies Allergies  Allergen Reactions  . Onion Anaphylaxis       . Cyramza [Ramucirumab] Itching    Itching, erythema, & tearing  . Taxotere [Docetaxel] Itching    Itching, erythema, & tearing    Level of Care/Admitting Diagnosis ED Disposition    ED Disposition Condition [facility]: [place] [[id number]]  Level of Care: Palliative Care [15]  Covid Evaluation: N/A  Diagnosis: Lung cancer [facility]) [[id number]]  Admitting Physician: Hyman Bible DODD [[id number]]  Attending Physician: Hyman Bible DODD [[id number]]  Estimated length of stay: past midnight tomorrow  Certification:: I certify this patient will need inpatient services for at least 2 midnights  PT Class (Do Not Modify): Inpatient [101]  PT Acc Code (Do Not Modify): Private [1]       B Medical/Surgery History Past Medical History:  Diagnosis Date  . Adenocarcinoma, lung ([place]) 10/08/2015   2.4cm spiculated LLL nodule suspicious for primary bronchogenic carcinoma by CT 09/2015 - adenocarcinoma by endobronchial biopsy  . Allergy   . Arthritis   . CAD (coronary artery disease)    mild MI; pt unaware - seen on stress test  . COPD (chronic obstructive pulmonary disease) ([place]) 06/26/2011   Mild centrilobular and paraseptal emphysema by CT 09/2015   . ED (erectile dysfunction)   . H/O anaphylactic shock 2013   hives,hypotension,syncope  . H/O seasonal allergies   . Herpes  zoster 12/06/2015  . Hypertension   . Polycythemia 01/25/2014  . Prostate cancer [facility]) 2013   s/p radiation therapy March 2013, planned f/u with uro after XRT  . Radiation 09/18/2011 through 11/10/2011   Prostate 7600 cGy 40 sessions, seminal vesicles 5600 cGy 40 sessions    . Radiation 11/10/15-12/22/15   chest 60 Gy  . Shortness of breath dyspnea   . Smoker   . Thoracic aorta atherosclerosis ([place]) 10/08/2015   Mild by CT 09/2015    Past Surgical History:  Procedure Laterality Date  . COLONOSCOPY W/ BIOPSIES AND POLYPECTOMY  10/11/15   mult TAs, 3cm rectal polypoid lesion; rec further treatment, diverticulosis ([place])  . ENDOBRONCHIAL ULTRASOUND Bilateral 10/18/2015   Procedure: ENDOBRONCHIAL ULTRASOUND;  Surgeon: Javier Glazier, MD;  Location: [facility] ENDOSCOPY;  Service: Cardiopulmonary;  Laterality: Bilateral;  . HERNIA REPAIR Right 2005   with mesh  . INSERTION PROSTATE RADIATION SEED  08/2011   prostate cancer, [place]  . IR FLUORO GUIDE PORT INSERTION RIGHT  07/26/2017  . IR US GUIDE VASC ACCESS RIGHT  07/26/2017  . LEFT HEART CATH AND CORONARY ANGIOGRAPHY N/A 08/05/2018   Procedure: LEFT HEART CATH AND CORONARY ANGIOGRAPHY;  Surgeon: Nelva Bush, MD;  Location: [place] [facility] LAB;  Service: Cardiovascular;  Laterality: N/A;     A IV Location/Drains/Wounds Patient Lines/Drains/Airways Status   Active Line/Drains/Airways    Name:   Placement date:   Placement time:   Site:   Days:   Implanted Port 07/26/17 Right Chest  07/26/17    1014    Chest   484   Peripheral IV 11/21/18 Right Antecubital   11/21/18    2010    Antecubital   1   Airway   10/18/15    0801     1131   Incision (Closed) 07/26/17 Chest Right;Upper   07/26/17    1033     484          Intake/Output Last 24 hours No intake or output data in the 24 hours ending 11/22/18 0223  Labs/Imaging Results for orders placed or performed during the hospital encounter of 11/21/18 (from the past  48 hour(s))  CBC with Differential     Status: Abnormal   Collection Time: 11/21/18  8:00 PM  Result Value Ref Range   WBC 15.9 (H) 4.0 - 10.5 K/uL   RBC 3.65 (L) 4.22 - 5.81 MIL/uL   Hemoglobin 11.1 (L) 13.0 - 17.0 g/dL   HCT 33.3 (L) 39.0 - 52.0 %   MCV 91.2 80.0 - 100.0 fL   MCH 30.4 26.0 - 34.0 pg   MCHC 33.3 30.0 - 36.0 g/dL   RDW 15.1 11.5 - 15.5 %   Platelets 247 150 - 400 K/uL   nRBC 0.0 0.0 - 0.2 %   Neutrophils Relative % 85 %   Neutro Abs 13.5 (H) 1.7 - 7.7 K/uL   Lymphocytes Relative 5 %   Lymphs Abs 0.8 0.7 - 4.0 K/uL   Monocytes Relative 8 %   Monocytes Absolute 1.3 (H) 0.1 - 1.0 K/uL   Eosinophils Relative 1 %   Eosinophils Absolute 0.1 0.0 - 0.5 K/uL   Basophils Relative 0 %   Basophils Absolute 0.1 0.0 - 0.1 K/uL   Immature Granulocytes 1 %   Abs Immature Granulocytes 0.15 (H) 0.00 - 0.07 K/uL    Comment: Performed at [facility], [address] [place], [place] [address]  Comprehensive metabolic panel     Status: Abnormal   Collection Time: 11/21/18  8:00 PM  Result Value Ref Range   Sodium 131 (L) 135 - 145 mmol/L   Potassium 4.7 3.5 - 5.1 mmol/L   Chloride 95 (L) 98 - 111 mmol/L   CO2 23 22 - 32 mmol/L   Glucose, Bld 162 (H) 70 - 99 mg/dL   BUN 22 8 - 23 mg/dL   Creatinine, Ser 1.16 0.61 - 1.24 mg/dL   Calcium 9.5 8.9 - 10.3 mg/dL   Total Protein 7.0 6.5 - 8.1 g/dL   Albumin 3.4 (L) 3.5 - 5.0 g/dL   AST 14 (L) 15 - 41 U/L   ALT 13 0 - 44 U/L   Alkaline Phosphatase 78 38 - 126 U/L   Total Bilirubin 1.1 0.3 - 1.2 mg/dL   GFR calc non Af Amer >60 >60 mL/min   GFR calc Af Amer >60 >60 mL/min   Anion gap 13 5 - 15    Comment: Performed at [facility], [address] [place], [place] [address]  Troponin I - Once     Status: None   Collection Time: 11/21/18  8:00 PM  Result Value Ref Range   Troponin I <0.03 <0.03 ng/mL    Comment: Performed at [facility], [address] [place],  [address]  Brain  natriuretic peptide     Status: Abnormal   Collection Time: 11/21/18  8:00 PM  Result Value Ref Range   B Natriuretic Peptide 160.0 (H) 0.0 - 100.0 pg/mL  Comment: Performed at [facility], [address] [place], [place] [address]  Lactic acid, plasma     Status: Abnormal   Collection Time: 11/21/18  8:01 PM  Result Value Ref Range   Lactic Acid, Venous 2.2 (HH) 0.5 - 1.9 mmol/L    Comment: CRITICAL RESULT CALLED TO, READ BACK BY AND VERIFIED WITH KATHARINA BUCKNUM AT 2049 11/21/2018.  TFK Performed at [facility], [address] [place], [place] [address]   SARS Coronavirus 2 (CEPHEID- Performed in [facility] hospital lab), Hosp Order     Status: None   Collection Time: 11/21/18  8:01 PM  Result Value Ref Range   SARS Coronavirus 2 NEGATIVE NEGATIVE    Comment: (NOTE) If result is NEGATIVE SARS-CoV-2 target nucleic acids are NOT DETECTED. The SARS-CoV-2 RNA is generally detectable in upper and lower  respiratory specimens during the acute phase of infection. The lowest  concentration of SARS-CoV-2 viral copies this assay can detect is 250  copies / mL. A negative result does not preclude SARS-CoV-2 infection  and should not be used as the sole basis for treatment or other  patient management decisions.  A negative result may occur with  improper specimen collection / handling, submission of specimen other  than nasopharyngeal swab, presence of viral mutation(s) within the  areas targeted by this assay, and inadequate number of viral copies  (<250 copies / mL). A negative result must be combined with clinical  observations, patient history, and epidemiological information. If result is POSITIVE SARS-CoV-2 target nucleic acids are DETECTED. The SARS-CoV-2 RNA is generally detectable in upper and lower  respiratory specimens dur ing the acute phase of infection.  Positive  results are indicative of active infection with SARS-CoV-2.  Clinical  correlation  with patient history and other diagnostic information is  necessary to determine patient infection status.  Positive results do  not rule out bacterial infection or co-infection with other viruses. If result is PRESUMPTIVE POSTIVE SARS-CoV-2 nucleic acids MAY BE PRESENT.   A presumptive positive result was obtained on the submitted specimen  and confirmed on repeat testing.  While 2019 novel coronavirus  (SARS-CoV-2) nucleic acids may be present in the submitted sample  additional confirmatory testing may be necessary for epidemiological  and / or clinical management purposes  to differentiate between  SARS-CoV-2 and other Sarbecovirus currently known to infect humans.  If clinically indicated additional testing with an alternate test  methodology [id number]) is advised. The SARS-CoV-2 RNA is generally  detectable in upper and lower respiratory sp ecimens during the acute  phase of infection. The expected result is Negative. Fact Sheet for Patients:  StrictlyIdeas.no Fact Sheet for Healthcare Providers: BankingDealers.co.za This test is not yet approved or cleared by the [place] FDA and has been authorized for detection and/or diagnosis of SARS-CoV-2 by FDA under an Emergency Use Authorization (EUA).  This EUA will remain in effect (meaning this test can be used) for the duration of the COVID-19 declaration under Section 564(b)(1) of the Act, 21 U.S.C. section 360bbb-3(b)(1), unless the authorization is terminated or revoked sooner. Performed at [facility], [address] [place], [place] [address]   Blood gas, venous     Status: Abnormal   Collection Time: 11/21/18  8:01 PM  Result Value Ref Range   pH, Ven 7.46 (H) 7.250 - 7.430   pCO2, Ven 37 (L) 44.0 - 60.0 mmHg   pO2, Ven <31.0 (LL) 32.0 - 45.0 mmHg   Bicarbonate 26.3  20.0 - 28.0 mmol/L   Acid-Base Excess 2.5 (H) 0.0 - 2.0 mmol/L   O2 Saturation 59.8 %    Patient temperature 37.0    Collection site VEIN    Sample type VENOUS     Comment: Performed at [facility] [address] [place], [place] [address]   Ct Chest W Contrast  Result Date: 11/20/2018 CLINICAL DATA:  Follow-up lung cancer EXAM: CT CHEST, ABDOMEN, AND PELVIS WITH CONTRAST TECHNIQUE: Multidetector CT imaging of the chest, abdomen and pelvis was performed following the standard protocol during bolus administration of intravenous contrast. CONTRAST:  119mL OMNIPAQUE IOHEXOL 300 MG/ML SOLN, additional oral enteric contrast COMPARISON:  CT chest abdomen pelvis, 07/18/2018 FINDINGS: CT CHEST FINDINGS Cardiovascular: Coronary artery disease. Cardiomegaly. Calcification of the lateral wall the left ventricle in keeping with prior infarction. Trace pericardial effusion. Mediastinum/Nodes: Redemonstrated soft tissue mass about the left hilum, mainstem bronchus and carina, enlarged compared to prior examination now with complete obstruction of the left mainstem bronchus. This mass abuts and may invade the midesophagus (series 9, image 26). Thyroid gland, trachea, and esophagus demonstrate no significant findings. Lungs/Pleura: Large left pleural effusion with total postobstructive atelectasis or consolidation of the left lung. Musculoskeletal: No chest wall mass or suspicious bone lesions identified. CT ABDOMEN PELVIS FINDINGS Hepatobiliary: No focal liver abnormality is seen. No gallstones, gallbladder wall thickening, or biliary dilatation. Pancreas: Unremarkable. No pancreatic ductal dilatation or surrounding inflammatory changes. Spleen: Normal in size without focal abnormality. Adrenals/Urinary Tract: Adrenal glands are unremarkable. Kidneys are normal, without renal calculi, focal lesion, or hydronephrosis. Bladder is unremarkable. Stomach/Bowel: Stomach is within normal limits. Appendix appears normal. No evidence of bowel wall thickening, distention, or inflammatory changes.  Vascular/Lymphatic: Mixed calcific atherosclerosis. Marked interval enlargement of bulky, hypodense gastrohepatic ligament and periaortic retroperitoneal lymph nodes, largest gastrohepatic ligament node measuring 6.0 cm, previously 2.0 cm (series 9, image 57). Reproductive: Prostatomegaly with median lobe hypertrophy and biopsy clips. Other: No abdominal wall hernia or abnormality. No abdominopelvic ascites. Musculoskeletal: No acute or significant osseous findings. IMPRESSION: 1. Redemonstrated soft tissue mass about the left hilum, mainstem bronchus and carina, enlarged compared to prior examination now with complete obstruction of the left mainstem bronchus. This mass abuts and may invade the midesophagus (series 9, image 26). Large left pleural effusion with total postobstructive atelectasis or consolidation of the left lung. 2. Marked interval enlargement of bulky, hypodense gastrohepatic ligament and periaortic retroperitoneal lymph nodes, largest gastrohepatic ligament node measuring 6.0 cm, previously 2.0 cm (series 9, image 57). 3. Findings are consistent with worsening malignancy and metastatic disease. 4.  Chronic and incidental findings as detailed above. Electronically Signed   By: Eddie Candle M.D.   On: 11/20/2018 11:40   Ct Abdomen Pelvis W Contrast  Result Date: 11/20/2018 CLINICAL DATA:  Follow-up lung cancer EXAM: CT CHEST, ABDOMEN, AND PELVIS WITH CONTRAST TECHNIQUE: Multidetector CT imaging of the chest, abdomen and pelvis was performed following the standard protocol during bolus administration of intravenous contrast. CONTRAST:  134mL OMNIPAQUE IOHEXOL 300 MG/ML SOLN, additional oral enteric contrast COMPARISON:  CT chest abdomen pelvis, 07/18/2018 FINDINGS: CT CHEST FINDINGS Cardiovascular: Coronary artery disease. Cardiomegaly. Calcification of the lateral wall the left ventricle in keeping with prior infarction. Trace pericardial effusion. Mediastinum/Nodes: Redemonstrated soft tissue  mass about the left hilum, mainstem bronchus and carina, enlarged compared to prior examination now with complete obstruction of the left mainstem bronchus. This mass abuts and may invade the midesophagus (series 9, image 26). Thyroid gland, trachea, and  esophagus demonstrate no significant findings. Lungs/Pleura: Large left pleural effusion with total postobstructive atelectasis or consolidation of the left lung. Musculoskeletal: No chest wall mass or suspicious bone lesions identified. CT ABDOMEN PELVIS FINDINGS Hepatobiliary: No focal liver abnormality is seen. No gallstones, gallbladder wall thickening, or biliary dilatation. Pancreas: Unremarkable. No pancreatic ductal dilatation or surrounding inflammatory changes. Spleen: Normal in size without focal abnormality. Adrenals/Urinary Tract: Adrenal glands are unremarkable. Kidneys are normal, without renal calculi, focal lesion, or hydronephrosis. Bladder is unremarkable. Stomach/Bowel: Stomach is within normal limits. Appendix appears normal. No evidence of bowel wall thickening, distention, or inflammatory changes. Vascular/Lymphatic: Mixed calcific atherosclerosis. Marked interval enlargement of bulky, hypodense gastrohepatic ligament and periaortic retroperitoneal lymph nodes, largest gastrohepatic ligament node measuring 6.0 cm, previously 2.0 cm (series 9, image 57). Reproductive: Prostatomegaly with median lobe hypertrophy and biopsy clips. Other: No abdominal wall hernia or abnormality. No abdominopelvic ascites. Musculoskeletal: No acute or significant osseous findings. IMPRESSION: 1. Redemonstrated soft tissue mass about the left hilum, mainstem bronchus and carina, enlarged compared to prior examination now with complete obstruction of the left mainstem bronchus. This mass abuts and may invade the midesophagus (series 9, image 26). Large left pleural effusion with total postobstructive atelectasis or consolidation of the left lung. 2. Marked interval  enlargement of bulky, hypodense gastrohepatic ligament and periaortic retroperitoneal lymph nodes, largest gastrohepatic ligament node measuring 6.0 cm, previously 2.0 cm (series 9, image 57). 3. Findings are consistent with worsening malignancy and metastatic disease. 4.  Chronic and incidental findings as detailed above. Electronically Signed   By: Eddie Candle M.D.   On: 11/20/2018 11:40   Dg Chest Port 1 View  Result Date: 11/21/2018 CLINICAL DATA:  Shortness of breath, fever. History of COPD, lung cancer. Cough. EXAM: PORTABLE CHEST 1 VIEW COMPARISON:  Chest CT 11/20/2018 FINDINGS: Right Port-A-Cath is in place with the tip in the upper SVC. Complete opacification of the left hemithorax, stable since recent CT yesterday. Right lung clear. No acute bony abnormality. IMPRESSION: Complete opacification of the left hemithorax stable since recent chest CT performed yesterday. This was shown to be related to a combination of effusion and obstruction of the left mainstem bronchus by central mass. Electronically Signed   By: Rolm Baptise M.D.   On: 11/21/2018 20:50    Pending Labs Unresulted Labs (From admission, onward)    Start     Ordered   11/21/18 1951  Culture, blood (routine x 2)  BLOOD CULTURE X 2,   STAT     11/21/18 1951          Vitals/Pain Today's Vitals   11/22/18 0000 11/22/18 0100 11/22/18 0200 11/22/18 0212  BP: (!) 87/60 (!) 83/44 (!) 88/48   Pulse: (!) 58 (!) 52 (!) 52   Resp: (!) 30 12 (!) 24   Temp:      TempSrc:      SpO2: 97% 96% 99%   Weight:      Height:      PainSc:    6     Isolation Precautions No active isolations  Medications Medications  albuterol (PROVENTIL) (2.5 MG/3ML) 0.083% nebulizer solution 2.5 mg (2.5 mg Nebulization Given 11/21/18 2352)  morphine 100mg  in NS 168mL (1mg /mL) infusion - premix (4 mg/hr Intravenous New Bag/Given 11/21/18 2356)  methylPREDNISolone sodium succinate (SOLU-MEDROL) 125 mg/2 mL injection 60 mg (has no administration in time  range)  methylPREDNISolone sodium succinate (SOLU-MEDROL) 125 mg/2 mL injection 125 mg (125 mg Intravenous Given 11/21/18 2033)  albuterol (  VENTOLIN HFA) 108 (90 Base) MCG/ACT inhaler 2 puff (2 puffs Inhalation Given 11/21/18 2010)  cefTRIAXone (ROCEPHIN) 1 g in sodium chloride 0.9 % 100 mL IVPB (0 g Intravenous Stopped 11/21/18 2209)  azithromycin (ZITHROMAX) 500 mg in sodium chloride 0.9 % 250 mL IVPB (0 mg Intravenous Stopped 11/21/18 2318)  sodium chloride 0.9 % bolus 500 mL (0 mLs Intravenous Stopped 11/21/18 2313)  ketamine (KETALAR) injection 20 mg (20 mg Intravenous Given 11/21/18 2145)  fentaNYL (SUBLIMAZE) injection 50 mcg (50 mcg Intravenous Given 11/21/18 2254)    Mobility walks Low fall risk   Focused Assessments Pulmonary Assessment Handoff:  Lung sounds: Bilateral Breath Sounds: Diminished, Expiratory wheezes, Coarse crackles L Breath Sounds: Rhonchi R Breath Sounds: Rhonchi O2 Device: Nasal Cannula O2 Flow Rate (L/min): 8 L/min      R Recommendations: See Admitting Provider Note  Report given to:   Additional Notes:

## 2018-11-22 NOTE — Progress Notes (Signed)
New referral for Brian White hospice services at home received following a Palliative Medicine consult. Writer met in the room with patient and Brian White to initiate education regarding hospice services, philosophy and team approach to care with understanding voiced. Plan is for discharge home via car today. Patient is not currently on oxygen, but did require oxygen earlier as well as nebulizer treatments. Discussed with patient and Brian White, oxygen and nebulizer machine ordered for delivery as soon as possible today. Patient and Brian White would like to discharge prior to DME being delivered and again stated he would discharge via car. Signed DNR in place along with prescriptions for liquid morphine, education regarding use provided. Hospital care team updated. All information faxed to referral. Flo Shanks BSN, RN, [facility] [phone]

## 2018-11-22 NOTE — Telephone Encounter (Signed)
Noted  

## 2018-11-22 NOTE — Progress Notes (Signed)
Nutrition Brief Note  Patient identified on the Malnutrition Screening Tool (MST) Report  68 y/o with history of adenocarcinoma of the lung, ICD, COPD.   Per chart review, pt wishes to have comfort care and home hospice.   No nutrition interventions warranted at this time. If nutrition issues arise, please consult RD.   Koleen Distance MS, RD, LDN Pager #- [phone] Office#- [phone] After Hours Pager: [phone]

## 2018-11-22 NOTE — Consult Note (Signed)
Consultation Note Date: 11/22/2018   Patient Name: Brian White  DOB: 1951/02/02  MRN: [id number]  Age / Sex: 68 y.o., male  PCP: [name], MD Referring Physician: Nicholes Mango, MD  Reason for Consultation: Establishing goals of care  HPI/Patient Profile: 68 y/o with history of adenocarcinoma of the lung, ICD, COPD. CT performed on yesterday 11/20/2018 demonstrated soft tissue mass on the left hilum mainstem bronchus and carina enlarged when compared to prior exam with complete obstruction of the left mainstem bronchus with mass possibly invading the mid esophagus with a large left pleural effusion and total postobstructive atelectasis of the left lung.   Clinical Assessment and Goals of Care: Patient is sitting on edge of bed, wife at bedside. They state they have decided to go home with hospice where he can spend time with his family. They understand his cancer has progressed and may have moved outside the lung. Paient states he has been through a lot and he is tired and ready to stop life prolonging measures, in favor of comfort focused care. We discussed aggressive vs comfort care. Discussed hospice. [name] states her first husband had hospice when he died and she is familiar with them.   Medications already in place for comfort care. Morphine drip in place. Patient would like to go home and spend time as normally as possible. He feels is breathing is okay at this time. Morphine drip discontinued and oral morphine initiated. Robinul, Ativan, and Haldol also in place. He confirms DNR status.      [place] with hospice.    Code Status/Advance Care Planning:  DNR    Symptom Management:   Morphine for pain and dyspnea  Ativan for anxiety  Haldol for agitation  Robinul for excessive secretions.    Prognosis:   < 3 months Lung CA which is enlarged since last  exam, COPD, CHF- EF 25%, CPR with [facility] 1/14.  Complete obstruction of the left mainstem bronchus with mass possibly invading the mid esophagus with a large left pleural effusion and total postobstructive atelectasis of the left lung.   Discharge Planning: Home with Hospice      Primary Diagnoses: Present on Admission:  Lung cancer ([place])   I have reviewed the medical record, interviewed the patient and family, and examined the patient. The following aspects are pertinent.  Past Medical History:  Diagnosis Date   Adenocarcinoma, lung ([place]) 10/08/2015   2.4cm spiculated LLL nodule suspicious for primary bronchogenic carcinoma by CT 09/2015 - adenocarcinoma by endobronchial biopsy   Allergy    Arthritis    CAD (coronary artery disease)    mild MI; pt unaware - seen on stress test   COPD (chronic obstructive pulmonary disease) ([place]) 06/26/2011   Mild centrilobular and paraseptal emphysema by CT 09/2015    ED (erectile dysfunction)    H/O anaphylactic shock 2013   hives,hypotension,syncope   H/O seasonal allergies    Herpes zoster 12/06/2015   Hypertension    Polycythemia 01/25/2014   Prostate  cancer [facility]) 2013   s/p radiation therapy March 2013, planned f/u with uro after XRT   Radiation 09/18/2011 through 11/10/2011   Prostate 7600 cGy 40 sessions, seminal vesicles 5600 cGy 40 sessions     Radiation 11/10/15-12/22/15   chest 60 Gy   Shortness of breath dyspnea    Smoker    Thoracic aorta atherosclerosis ([place]) 10/08/2015   Mild by CT 09/2015    Social History   Socioeconomic History   Marital status: Married    Spouse name: Not on file   Number of children: Y   Years of education: Not on file   Highest education level: Not on file  Occupational History   Occupation: retired  Scientist, product/process development strain: Not hard at [employer] insecurity:    Worry: Never true    Inability: Never true   Transportation needs:     Medical: No    Non-medical: No  Tobacco Use   Smoking status: Heavy Tobacco Smoker    Packs/day: 0.67    Years: 54.00    Pack years: 36.18    Types: Cigarettes    Start date: 01/16/1967    Last attempt to quit: 11/06/2015    Years since quitting: 3.0   Smokeless tobacco: Never Used   Tobacco comment: Peak rate of 2ppd; quit on Chantix but started again   Substance and Sexual Activity   Alcohol use: Yes    Alcohol/week: 1.0 standard drinks    Types: 1 Shots of liquor per week    Comment: 2-3 beers or mixed drinks daily     Drug use: Yes    Types: Marijuana    Comment: marijuana occasional weekly   Sexual activity: Yes    Birth control/protection: None  Lifestyle   Physical activity:    Days per week: 0 days    Minutes per session: 0 min   Stress: Not at all  Relationships   Social connections:    Talks on phone: Once a week    Gets together: Once a week    Attends religious service: 1 to 4 times per year    Active member of club or organization: No    Attends meetings of clubs or organizations: Never    Relationship status: Married  Other Topics Concern   Not on file  Social History Narrative   Lives with wife, dogs, chickens and goats   Occupation: retired.  On disability for herinated discs in back   Edu: HS   Activity: works outside [employer], takes care of farm animals   Diet: good water, fruits/vegetables daily      [facility]:   Originally from [place]. 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 No known asbestos exposure. Questionable mold exposure. Has 2 dogs currently. No recent bird exposure. Previously had chickens.    Family History  Problem Relation Age of Onset   Asthma Father    COPD Father    CAD Father        possibly   Emphysema Father    Breast cancer Sister    Atrial fibrillation Sister    Hypertension Mother    Diabetes Neg Hx    Stroke Neg Hx     Colon cancer Neg Hx    Colon polyps Neg Hx    Rectal cancer Neg Hx    Stomach cancer Neg Hx    Scheduled Meds:  folic acid  1 mg Intravenous Daily   ipratropium-albuterol  3 mL Nebulization Q6H   methylPREDNISolone (SOLU-MEDROL) injection  60 mg Intravenous Q8H   sodium chloride flush  3 mL Intravenous Q12H   Continuous Infusions:  sodium chloride     PRN Meds:.sodium chloride, acetaminophen **OR** acetaminophen, albuterol, antiseptic oral rinse, antiseptic oral rinse, diphenhydrAMINE, glycopyrrolate **OR** glycopyrrolate **OR** glycopyrrolate, haloperidol **OR** haloperidol **OR** haloperidol lactate, LORazepam **OR** LORazepam **OR** LORazepam, morphine CONCENTRATE, ondansetron **OR** ondansetron (ZOFRAN) IV, polyvinyl alcohol, polyvinyl alcohol, sodium chloride flush Medications Prior to Admission:  Prior to Admission medications   Medication Sig Start Date End Date Taking? Authorizing Provider  apixaban (ELIQUIS) 5 MG TABS tablet Take 1 tablet (5 mg total) by mouth 2 (two) times daily. 06/05/18  Yes Camnitz, Will Hassell Done, MD  folic acid (FOLVITE) 1 MG tablet TAKE 1 TABLET(1 MG) BY MOUTH DAILY Patient taking differently: Take 1 mg by mouth daily.  10/02/18  Yes [name], MD  losartan (COZAAR) 25 MG tablet Take 1 tablet (25 mg total) by mouth daily. 06/05/18  Yes Lelon Perla, MD  mirtazapine (REMERON) 15 MG tablet TAKE 1 TABLET(15 MG) BY MOUTH AT BEDTIME Patient taking differently: Take 15 mg by mouth at bedtime.  10/16/18  Yes [name], MD  potassium chloride SA (K-DUR,KLOR-CON) 20 MEQ tablet Take 1 tablet (20 mEq total) by mouth daily. 10/08/18  Yes [name], MD  spironolactone (ALDACTONE) 25 MG tablet Take 0.5 tablets (12.5 mg total) by mouth daily. 08/08/18  Yes Purohit, Konrad Dolores, MD  acetaminophen (TYLENOL) 500 MG tablet Take 1,000 mg by mouth every 6 (six) hours as needed (for pain.).    [provider]  amiodarone (PACERONE) 200 MG tablet  Take 1 tablet (200 mg total) by mouth daily for 30 days. 08/23/18 09/22/18  Lelon Perla, MD  budesonide-formoterol (SYMBICORT) 160-4.5 MCG/ACT inhaler Inhale 1 puff into the lungs 2 (two) times daily. 02/28/17   [name], MD  cetirizine (ZYRTEC) 10 MG chewable tablet Chew 10 mg by mouth daily as needed (for seasonal allergies).  02/19/17   [provider]  dexamethasone (DECADRON) 4 MG tablet TAKE 2 TABLET BY MOUTH TWICE DAILY THE DAY BEFORE, DAY OF AND AFTER THE CHEMOTHERAPY EVERY 3 WEEKS 07/19/18   [name], MD  furosemide (LASIX) 40 MG tablet Take 1 tablet (40 mg total) by mouth 2 (two) times daily. 10/08/18   [name], MD  guaiFENesin (MUCINEX) 600 MG 12 hr tablet Take 600 mg by mouth 2 (two) times daily as needed (for cough/congestion).     [provider]  levalbuterol Penne Lash HFA) 45 MCG/ACT inhaler Inhale 2 puffs into the lungs every 4 (four) hours as needed for wheezing. 08/08/18 08/08/19  Purohit, Konrad Dolores, MD  lidocaine-prilocaine (EMLA) cream Apply 1 application topically as needed. Squeeze a  small amount of cream to cotton ball and apply to port site 1-2 hours prior to chemotherapy. Do not rub in the cream and cover  with plastic wrap. 07/30/17   [name], MD  metoprolol succinate (TOPROL XL) 25 MG 24 hr tablet Take 1 tablet (25 mg total) by mouth daily. 08/23/18   Lelon Perla, MD  omeprazole (PRILOSEC) 20 MG capsule Take 1 capsule (20 mg total) by mouth daily. 06/26/18   Maryanna Shape, NP  prochlorperazine (COMPAZINE) 10 MG tablet Take 1 tablet (10 mg total) by mouth every 6 (six) hours as needed for nausea or vomiting. Patient not taking: Reported on 09/04/2018 07/25/18  [name], MD  triamcinolone cream (KENALOG) 0.5 % Apply 1 application topically 3 (three) times daily. 07/25/18   Harle Stanford., PA-C   Allergies  Allergen Reactions   Onion Anaphylaxis        Cyramza [Ramucirumab] Itching    Itching, erythema, & tearing     Taxotere [Docetaxel] Itching    Itching, erythema, & tearing   Review of Systems  Respiratory: Positive for shortness of breath.     Physical Exam Pulmonary:     Effort: Pulmonary effort is normal.     Comments: No O2 at this time Neurological:     Mental Status: He is alert.     Comments: Oriented     Vital Signs: BP (!) 82/42 (BP Location: Right Arm)    Pulse (!) 51    Temp 98.3 F (36.8 C)    Resp 20    Ht 5\' 11"  (1.803 m)    Wt 72.6 kg    SpO2 95%    BMI 22.32 kg/m  Pain Scale: 0-10   Pain Score: 0-No pain   SpO2: SpO2: 95 % O2 Device:SpO2: 95 % O2 Flow Rate: .O2 Flow Rate (L/min): 3.5 L/min  IO: Intake/output summary:   Intake/Output Summary (Last 24 hours) at 11/22/2018 1104 Last data filed at 11/22/2018 0400 Gross per 24 hour  Intake 14.76 ml  Output --  Net 14.76 ml    LBM: Last BM Date: 11/21/18 Baseline Weight: Weight: 72.6 kg Most recent weight: Weight: 72.6 kg     Palliative Assessment/Data: 50%   Flowsheet Rows     Most Recent Value  Intake Tab  Referral Department  Hospitalist  Unit at Time of Referral  Med/Surg Unit  Palliative Care Primary Diagnosis  Cancer  Date Notified  11/22/18  Palliative Care Type  New Palliative care  Reason for referral  Non-pain Symptom  Date of Admission  11/21/18  # of days IP prior to Palliative referral  1  Clinical Assessment  Psychosocial & Spiritual Assessment  Palliative Care Outcomes      Time In: 10:25 Time Out: 11:15 Time Total: 50 min Greater than 50%  of this time was spent counseling and coordinating care related to the above assessment and plan.  Signed by: Asencion Gowda, NP   Please contact Palliative Medicine Team phone at [phone] for questions and concerns.  For individual provider: See Shea Evans

## 2018-11-22 NOTE — Telephone Encounter (Signed)
[facility] Nurse called stating that patient is being discharged from the hospital. Brian White is requesting verbal orders to initiate Hospice service and requested a call back.

## 2018-11-22 NOTE — Discharge Summary (Signed)
[facility] NAME: Brian White    MR#:  [id number]  DATE OF BIRTH:  Feb 11, 1951  DATE OF ADMISSION:  11/21/2018 ADMITTING PHYSICIAN: Sela Hua, MD  DATE OF DISCHARGE: 11/22/2018 PRIMARY CARE PHYSICIAN: [name], MD    ADMISSION DIAGNOSIS:  Cough [R05] Respiratory failure, unspecified chronicity, unspecified whether with hypoxia or hypercapnia ([place]) [J96.90]  DISCHARGE DIAGNOSIS:  Active Problems:   Lung cancer ([place])   SECONDARY DIAGNOSIS:   Past Medical History:  Diagnosis Date  . Adenocarcinoma, lung ([place]) 10/08/2015   2.4cm spiculated LLL nodule suspicious for primary bronchogenic carcinoma by CT 09/2015 - adenocarcinoma by endobronchial biopsy  . Allergy   . Arthritis   . CAD (coronary artery disease)    mild MI; pt unaware - seen on stress test  . COPD (chronic obstructive pulmonary disease) ([place]) 06/26/2011   Mild centrilobular and paraseptal emphysema by CT 09/2015   . ED (erectile dysfunction)   . H/O anaphylactic shock 2013   hives,hypotension,syncope  . H/O seasonal allergies   . Herpes zoster 12/06/2015  . Hypertension   . Polycythemia 01/25/2014  . Prostate cancer [facility]) 2013   s/p radiation therapy March 2013, planned f/u with uro after XRT  . Radiation 09/18/2011 through 11/10/2011   Prostate 7600 cGy 40 sessions, seminal vesicles 5600 cGy 40 sessions    . Radiation 11/10/15-12/22/15   chest 60 Gy  . Shortness of breath dyspnea   . Smoker   . Thoracic aorta atherosclerosis ([place]) 10/08/2015   Mild by CT 09/2015     HOSPITAL COURSE:   Brian White  is a 68 y.o. male with a known history of adenocarcinoma of the lung, ICD, COPD.  Patient has not been a chemotherapy candidate since December 2019.  CT performed on yesterday 11/20/2018 demonstrated soft tissue mass on the left hilum mainstem bronchus and carina enlarged when compared to prior exam with complete obstruction of the  left mainstem bronchus with mass possibly invading the mid esophagus with a large left pleural effusion and total postobstructive atelectasis of the left lung.  He states he was to meet with hospice care tomorrow.  However he developed increasing shortness of breath with hypotension and was brought to the emergency room for further evaluation.  Patient feels he may have had a slight fever earlier in the day.  He denies having had nausea, vomiting, diarrhea, abdominal pain.  He has a frequent hacking type cough which is nonproductive.  He denies hemoptysis.  He reports pleuritic type chest pain.  Patient is awake and alert.  He voices his wishes and that he does not want to be placed on a ventilator nor have CPR in the event of cardiac or respiratory arrest.  Patient request he be made comfortable.  He is interested in palliative care consultation as well.  He has been admitted to the hospital service for further management.  Initial palliative care measures have been initiated and consult has been requested.   1.  Adenocarcinoma of the lung - As patient has been decided to no longer be a chemotherapy candidate, he wishes to have comfort care measures at this time.  I have had a long conversation with the patient concerning his end-of-life wishes.  Patient wishes to be made comfortable.  He does not want CPR or any life-saving measures including CPAP/BiPAP/or ventilator management. - Palliative care has been consulted and comfort measures have been implemented - Pain will be managed with  p.o. morphine as needed - Anxiety will be managed with Ativan. - Patient has O2 per nasal cannula prn -Discharge home with hospice care.  Patient and wife are agreeable  2.  COPD - Will continue DuoNeb every 6 hours and as needed - Patient was given initial Solu-Medrol in the emergency room.  We will resume home medication Decadron - Oxygen as needed to go home  3. hypotension - Patient has received IV fluid  bolus in the emergency room and IV fluids during the hospital course. However he does not wish to have vasopressor therapy. Patient is made comfort care.  Held blood pressure medications in view of hypotension  Patient is getting discharged home with comfort care measures.  Hospice care will be implemented at home  DISCHARGE CONDITIONS:   guarded  CONSULTS OBTAINED:     PROCEDURES  None   DRUG ALLERGIES:   Allergies  Allergen Reactions  . Onion Anaphylaxis       . Cyramza [Ramucirumab] Itching    Itching, erythema, & tearing  . Taxotere [Docetaxel] Itching    Itching, erythema, & tearing    DISCHARGE MEDICATIONS:   Allergies as of 11/22/2018      Reactions   Onion Anaphylaxis      Cyramza [ramucirumab] Itching   Itching, erythema, & tearing   Taxotere [docetaxel] Itching   Itching, erythema, & tearing      Medication List    STOP taking these medications   amiodarone 200 MG tablet Commonly known as:  PACERONE   furosemide 40 MG tablet Commonly known as:  LASIX   losartan 25 MG tablet Commonly known as:  COZAAR   metoprolol succinate 25 MG 24 hr tablet Commonly known as:  Toprol XL   potassium chloride SA 20 MEQ tablet Commonly known as:  K-DUR   prochlorperazine 10 MG tablet Commonly known as:  COMPAZINE   spironolactone 25 MG tablet Commonly known as:  ALDACTONE     TAKE these medications   acetaminophen 500 MG tablet Commonly known as:  TYLENOL Take 1,000 mg by mouth every 6 (six) hours as needed (for pain.).   albuterol (2.5 MG/3ML) 0.083% nebulizer solution Commonly known as:  PROVENTIL Take 3 mLs (2.5 mg total) by nebulization every 2 (two) hours as needed for wheezing or shortness of breath.   apixaban 5 MG Tabs tablet Commonly known as:  ELIQUIS Take 1 tablet (5 mg total) by mouth 2 (two) times daily.   budesonide-formoterol 160-4.5 MCG/ACT inhaler Commonly known as:  SYMBICORT Inhale 1 puff into the lungs 2 (two) times daily.    cetirizine 10 MG chewable tablet Commonly known as:  ZYRTEC Chew 10 mg by mouth daily as needed (for seasonal allergies).   dexamethasone 4 MG tablet Commonly known as:  DECADRON TAKE 2 TABLET BY MOUTH TWICE DAILY THE DAY BEFORE, DAY OF AND AFTER THE CHEMOTHERAPY EVERY 3 WEEKS   folic acid 1 MG tablet Commonly known as:  FOLVITE TAKE 1 TABLET(1 MG) BY MOUTH DAILY What changed:  See the new instructions.   guaiFENesin 600 MG 12 hr tablet Commonly known as:  MUCINEX Take 600 mg by mouth 2 (two) times daily as needed (for cough/congestion).   ipratropium-albuterol 0.5-2.5 (3) MG/3ML Soln Commonly known as:  DUONEB Take 3 mLs by nebulization every 6 (six) hours.   levalbuterol 45 MCG/ACT inhaler Commonly known as:  XOPENEX HFA Inhale 2 puffs into the lungs every 4 (four) hours as needed for wheezing.   lidocaine-prilocaine cream Commonly  known as:  EMLA Apply 1 application topically as needed. Squeeze a  small amount of cream to cotton ball and apply to port site 1-2 hours prior to chemotherapy. Do not rub in the cream and cover  with plastic wrap.   LORazepam 1 MG tablet Commonly known as:  ATIVAN Take 1 tablet (1 mg total) by mouth every 4 (four) hours as needed for anxiety.   mirtazapine 15 MG tablet Commonly known as:  REMERON TAKE 1 TABLET(15 MG) BY MOUTH AT BEDTIME What changed:  See the new instructions.   morphine CONCENTRATE 10 MG/0.5ML Soln concentrated solution Take 0.25-0.5 mLs (5-10 mg total) by mouth every 30 (thirty) minutes as needed for moderate pain, severe pain or shortness of breath.   omeprazole 20 MG capsule Commonly known as:  PRILOSEC Take 1 capsule (20 mg total) by mouth daily.   triamcinolone cream 0.5 % Commonly known as:  KENALOG Apply 1 application topically 3 (three) times daily.        DISCHARGE INSTRUCTIONS:     DIET:  Regular diet  DISCHARGE CONDITION:  Fair  ACTIVITY:  Activity as tolerated  OXYGEN:  Home Oxygen: Yes.      Oxygen Delivery: 2 liters/min via Patient connected to nasal cannula oxygen  DISCHARGE LOCATION:  home   If you experience worsening of your admission symptoms, develop shortness of breath, life threatening emergency, suicidal or homicidal thoughts you must seek medical attention immediately by calling 911 or calling your MD immediately  if symptoms less severe.  You Must read complete instructions/literature along with all the possible adverse reactions/side effects for all the Medicines you take and that have been prescribed to you. Take any new Medicines after you have completely understood and accpet all the possible adverse reactions/side effects.   Please note  You were cared for by a hospitalist during your hospital stay. If you have any questions about your discharge medications or the care you received while you were in the hospital after you are discharged, you can call the unit and asked to speak with the hospitalist on call if the hospitalist that took care of you is not available. Once you are discharged, your primary care physician will handle any further medical issues. Please note that NO REFILLS for any discharge medications will be authorized once you are discharged, as it is imperative that you return to your primary care physician (or establish a relationship with a primary care physician if you do not have one) for your aftercare needs so that they can reassess your need for medications and monitor your lab values.     Today  Chief Complaint  Patient presents with  . Respiratory Distress  . Fever   Patient is resting comfortably.  No shortness of breath.  Wife at bedside.  ROS:  CONSTITUTIONAL: Denies fevers, chills.  Reports fatigue, weakness.  EYES: Denies blurry vision, double vision, eye pain. EARS, NOSE, THROAT: Denies tinnitus, ear pain, hearing loss. RESPIRATORY: Denies cough, wheeze, reports exertional shortness of breath.  CARDIOVASCULAR: Denies chest  pain, palpitations, edema.  GASTROINTESTINAL: Denies nausea, vomiting, diarrhea, abdominal pain. Denies bright red blood per rectum. NEUROLOGIC: Denies paralysis, paresthesias.  PSYCHIATRIC: Denies anxiety or depressive symptoms.   VITAL SIGNS:  Blood pressure (!) 82/42, pulse (!) 51, temperature 98.3 F (36.8 C), resp. rate 20, height 5\' 11"  (1.803 m), weight 72.6 kg, SpO2 93 %.  I/O:    Intake/Output Summary (Last 24 hours) at 11/22/2018 1332 Last data filed at 11/22/2018 0400  Gross per 24 hour  Intake 14.76 ml  Output -  Net 14.76 ml    PHYSICAL EXAMINATION:  GENERAL:  68 y.o.-year-old patient lying in the bed with no acute distress.  EYES: Pupils equal, round, reactive to light and accommodation. No scleral icterus. Extraocular muscles intact.  HEENT: Head atraumatic, normocephalic. Oropharynx and nasopharynx clear.  NECK:  Supple, no jugular venous distention. No thyroid enlargement, no tenderness.  LUNGS: Normal breath sounds bilaterally, no wheezing, rales,rhonchi or crepitation. No use of accessory muscles of respiration.  CARDIOVASCULAR: S1, S2 normal. No murmurs, rubs, or gallops.  ABDOMEN: Soft, non-tender, non-distended. Bowel sounds present.  EXTREMITIES: No pedal edema, cyanosis, or clubbing.  NEUROLOGIC: Awake alert and oriented x3 sensation intact. Gait not checked.  PSYCHIATRIC: The patient is alert and oriented x 3.  SKIN: No obvious rash, lesion, or ulcer.   DATA REVIEW:   CBC Recent Labs  Lab 11/21/18 2000  WBC 15.9*  HGB 11.1*  HCT 33.3*  PLT 247    Chemistries  Recent Labs  Lab 11/21/18 2000  NA 131*  K 4.7  CL 95*  CO2 23  GLUCOSE 162*  BUN 22  CREATININE 1.16  CALCIUM 9.5  AST 14*  ALT 13  ALKPHOS 78  BILITOT 1.1    Cardiac Enzymes Recent Labs  Lab 11/21/18 2000  TROPONINI <0.03    Microbiology Results  Results for orders placed or performed during the hospital encounter of 11/21/18  Culture, blood (routine x 2)     Status:  None (Preliminary result)   Collection Time: 11/21/18  8:01 PM  Result Value Ref Range Status   Specimen Description BLOOD RIGHT ANTECUBITAL  Final   Special Requests   Final    BOTTLES DRAWN AEROBIC AND ANAEROBIC Blood Culture adequate volume   Culture   Final    NO GROWTH < 12 HOURS Performed at [facility], [address], [place], [place] [address]    Report Status PENDING  Incomplete  Culture, blood (routine x 2)     Status: None (Preliminary result)   Collection Time: 11/21/18  8:01 PM  Result Value Ref Range Status   Specimen Description BLOOD RIGHT ANTECUBITAL  Final   Special Requests   Final    BOTTLES DRAWN AEROBIC AND ANAEROBIC Blood Culture results may not be optimal due to an excessive volume of blood received in culture bottles   Culture   Final    NO GROWTH < 12 HOURS Performed at [facility], [address] [place], [place] [address]    Report Status PENDING  Incomplete  SARS Coronavirus 2 (CEPHEID- Performed in [place] hospital lab), Hosp Order     Status: None   Collection Time: 11/21/18  8:01 PM  Result Value Ref Range Status   SARS Coronavirus 2 NEGATIVE NEGATIVE Final    Comment: (NOTE) If result is NEGATIVE SARS-CoV-2 target nucleic acids are NOT DETECTED. The SARS-CoV-2 RNA is generally detectable in upper and lower  respiratory specimens during the acute phase of infection. The lowest  concentration of SARS-CoV-2 viral copies this assay can detect is 250  copies / mL. A negative result does not preclude SARS-CoV-2 infection  and should not be used as the sole basis for treatment or other  patient management decisions.  A negative result may occur with  improper specimen collection / handling, submission of specimen other  than nasopharyngeal swab, presence of viral mutation(s) within the  areas targeted by this assay, and inadequate  number of viral copies  (<250 copies / mL). A negative result must be combined with clinical   observations, patient history, and epidemiological information. If result is POSITIVE SARS-CoV-2 target nucleic acids are DETECTED. The SARS-CoV-2 RNA is generally detectable in upper and lower  respiratory specimens dur ing the acute phase of infection.  Positive  results are indicative of active infection with SARS-CoV-2.  Clinical  correlation with patient history and other diagnostic information is  necessary to determine patient infection status.  Positive results do  not rule out bacterial infection or co-infection with other viruses. If result is PRESUMPTIVE POSTIVE SARS-CoV-2 nucleic acids MAY BE PRESENT.   A presumptive positive result was obtained on the submitted specimen  and confirmed on repeat testing.  While 2019 novel coronavirus  (SARS-CoV-2) nucleic acids may be present in the submitted sample  additional confirmatory testing may be necessary for epidemiological  and / or clinical management purposes  to differentiate between  SARS-CoV-2 and other Sarbecovirus currently known to infect humans.  If clinically indicated additional testing with an alternate test  methodology [phone]) is advised. The SARS-CoV-2 RNA is generally  detectable in upper and lower respiratory sp ecimens during the acute  phase of infection. The expected result is Negative. Fact Sheet for Patients:  StrictlyIdeas.no Fact Sheet for Healthcare Providers: BankingDealers.co.za This test is not yet approved or cleared by the [place] FDA and has been authorized for detection and/or diagnosis of SARS-CoV-2 by FDA under an Emergency Use Authorization (EUA).  This EUA will remain in effect (meaning this test can be used) for the duration of the COVID-19 declaration under Section 564(b)(1) of the Act, 21 U.S.C. section 360bbb-3(b)(1), unless the authorization is terminated or revoked sooner. Performed at [facility], [address] [place], [place] [address]     RADIOLOGY:  Ct Chest W Contrast  Result Date: 11/20/2018 CLINICAL DATA:  Follow-up lung cancer EXAM: CT CHEST, ABDOMEN, AND PELVIS WITH CONTRAST TECHNIQUE: Multidetector CT imaging of the chest, abdomen and pelvis was performed following the standard protocol during bolus administration of intravenous contrast. CONTRAST:  19mL OMNIPAQUE IOHEXOL 300 MG/ML SOLN, additional oral enteric contrast COMPARISON:  CT chest abdomen pelvis, 07/18/2018 FINDINGS: CT CHEST FINDINGS Cardiovascular: Coronary artery disease. Cardiomegaly. Calcification of the lateral wall the left ventricle in keeping with prior infarction. Trace pericardial effusion. Mediastinum/Nodes: Redemonstrated soft tissue mass about the left hilum, mainstem bronchus and carina, enlarged compared to prior examination now with complete obstruction of the left mainstem bronchus. This mass abuts and may invade the midesophagus (series 9, image 26). Thyroid gland, trachea, and esophagus demonstrate no significant findings. Lungs/Pleura: Large left pleural effusion with total postobstructive atelectasis or consolidation of the left lung. Musculoskeletal: No chest wall mass or suspicious bone lesions identified. CT ABDOMEN PELVIS FINDINGS Hepatobiliary: No focal liver abnormality is seen. No gallstones, gallbladder wall thickening, or biliary dilatation. Pancreas: Unremarkable. No pancreatic ductal dilatation or surrounding inflammatory changes. Spleen: Normal in size without focal abnormality. Adrenals/Urinary Tract: Adrenal glands are unremarkable. Kidneys are normal, without renal calculi, focal lesion, or hydronephrosis. Bladder is unremarkable. Stomach/Bowel: Stomach is within normal limits. Appendix appears normal. No evidence of bowel wall thickening, distention, or inflammatory changes. Vascular/Lymphatic: Mixed calcific atherosclerosis. Marked interval enlargement of bulky, hypodense gastrohepatic ligament and  periaortic retroperitoneal lymph nodes, largest gastrohepatic ligament node measuring 6.0 cm, previously 2.0 cm (series 9, image 57). Reproductive: Prostatomegaly with median lobe hypertrophy and biopsy clips. Other: No abdominal wall hernia or abnormality. No  abdominopelvic ascites. Musculoskeletal: No acute or significant osseous findings. IMPRESSION: 1. Redemonstrated soft tissue mass about the left hilum, mainstem bronchus and carina, enlarged compared to prior examination now with complete obstruction of the left mainstem bronchus. This mass abuts and may invade the midesophagus (series 9, image 26). Large left pleural effusion with total postobstructive atelectasis or consolidation of the left lung. 2. Marked interval enlargement of bulky, hypodense gastrohepatic ligament and periaortic retroperitoneal lymph nodes, largest gastrohepatic ligament node measuring 6.0 cm, previously 2.0 cm (series 9, image 57). 3. Findings are consistent with worsening malignancy and metastatic disease. 4.  Chronic and incidental findings as detailed above. Electronically Signed   By: Eddie Candle M.D.   On: 11/20/2018 11:40   Ct Abdomen Pelvis W Contrast  Result Date: 11/20/2018 CLINICAL DATA:  Follow-up lung cancer EXAM: CT CHEST, ABDOMEN, AND PELVIS WITH CONTRAST TECHNIQUE: Multidetector CT imaging of the chest, abdomen and pelvis was performed following the standard protocol during bolus administration of intravenous contrast. CONTRAST:  172mL OMNIPAQUE IOHEXOL 300 MG/ML SOLN, additional oral enteric contrast COMPARISON:  CT chest abdomen pelvis, 07/18/2018 FINDINGS: CT CHEST FINDINGS Cardiovascular: Coronary artery disease. Cardiomegaly. Calcification of the lateral wall the left ventricle in keeping with prior infarction. Trace pericardial effusion. Mediastinum/Nodes: Redemonstrated soft tissue mass about the left hilum, mainstem bronchus and carina, enlarged compared to prior examination now with complete obstruction of  the left mainstem bronchus. This mass abuts and may invade the midesophagus (series 9, image 26). Thyroid gland, trachea, and esophagus demonstrate no significant findings. Lungs/Pleura: Large left pleural effusion with total postobstructive atelectasis or consolidation of the left lung. Musculoskeletal: No chest wall mass or suspicious bone lesions identified. CT ABDOMEN PELVIS FINDINGS Hepatobiliary: No focal liver abnormality is seen. No gallstones, gallbladder wall thickening, or biliary dilatation. Pancreas: Unremarkable. No pancreatic ductal dilatation or surrounding inflammatory changes. Spleen: Normal in size without focal abnormality. Adrenals/Urinary Tract: Adrenal glands are unremarkable. Kidneys are normal, without renal calculi, focal lesion, or hydronephrosis. Bladder is unremarkable. Stomach/Bowel: Stomach is within normal limits. Appendix appears normal. No evidence of bowel wall thickening, distention, or inflammatory changes. Vascular/Lymphatic: Mixed calcific atherosclerosis. Marked interval enlargement of bulky, hypodense gastrohepatic ligament and periaortic retroperitoneal lymph nodes, largest gastrohepatic ligament node measuring 6.0 cm, previously 2.0 cm (series 9, image 57). Reproductive: Prostatomegaly with median lobe hypertrophy and biopsy clips. Other: No abdominal wall hernia or abnormality. No abdominopelvic ascites. Musculoskeletal: No acute or significant osseous findings. IMPRESSION: 1. Redemonstrated soft tissue mass about the left hilum, mainstem bronchus and carina, enlarged compared to prior examination now with complete obstruction of the left mainstem bronchus. This mass abuts and may invade the midesophagus (series 9, image 26). Large left pleural effusion with total postobstructive atelectasis or consolidation of the left lung. 2. Marked interval enlargement of bulky, hypodense gastrohepatic ligament and periaortic retroperitoneal lymph nodes, largest gastrohepatic ligament  node measuring 6.0 cm, previously 2.0 cm (series 9, image 57). 3. Findings are consistent with worsening malignancy and metastatic disease. 4.  Chronic and incidental findings as detailed above. Electronically Signed   By: Eddie Candle M.D.   On: 11/20/2018 11:40   Dg Chest Port 1 View  Result Date: 11/21/2018 CLINICAL DATA:  Shortness of breath, fever. History of COPD, lung cancer. Cough. EXAM: PORTABLE CHEST 1 VIEW COMPARISON:  Chest CT 11/20/2018 FINDINGS: Right Port-A-Cath is in place with the tip in the upper SVC. Complete opacification of the left hemithorax, stable since recent CT yesterday. Right lung clear. No acute bony  abnormality. IMPRESSION: Complete opacification of the left hemithorax stable since recent chest CT performed yesterday. This was shown to be related to a combination of effusion and obstruction of the left mainstem bronchus by central mass. Electronically Signed   By: Rolm Baptise M.D.   On: 11/21/2018 20:50    EKG:   Orders placed or performed during the hospital encounter of 11/21/18  . ED EKG  . ED EKG      Management plans discussed with the patient, wife at bedside and they are in agreement.  CODE STATUS:     Code Status Orders  (From admission, onward)         Start     Ordered   11/22/18 1056  Do not attempt resuscitation (DNR)  Continuous    Question Answer Comment  In the event of cardiac or respiratory ARREST Do not call a "code blue"   In the event of cardiac or respiratory ARREST Do not perform Intubation, CPR, defibrillation or ACLS   In the event of cardiac or respiratory ARREST Use medication by any route, position, wound care, and other measures to relive pain and suffering. May use oxygen, suction and manual treatment of airway obstruction as needed for comfort.   Comments MOST form on chart      11/22/18 1057        Code Status History    Date Active Date Inactive Code Status Order ID Comments User Context   11/22/2018 0328 11/22/2018  1057 DNR [id number]  Mayer Camel, NP Inpatient   11/22/2018 0328 11/22/2018 0328 DNR [id number]  Mayer Camel, NP Inpatient   07/28/2018 1523 08/08/2018 1417 Full Code [id number]  Marcell Anger, MD Inpatient   05/01/2018 1742 05/04/2018 1759 Full Code [id number]  Janora Norlander, MD ED   06/26/2011 0545 06/26/2011 1910 Full Code [id number]  Guido Sander, RN Inpatient    Advance Directive Documentation     Most Recent Value  Type of Advance Directive  Healthcare Power of [place], Out of facility DNR (pink MOST or yellow form)  Pre-existing out of facility DNR order (yellow form or pink MOST form)  Yellow form placed in chart (order not valid for inpatient use)  "MOST" Form in Place?  -      TOTAL TIME TAKING CARE OF THIS PATIENT: 42 minutes.   Note: This dictation was prepared with Dragon dictation along with smaller phrase technology. Any transcriptional errors that result from this process are unintentional.   @[facility] @  on 11/22/2018 at 1:32 PM  Between 7am to 6pm - Pager - [phone]  After 6pm go to [email or web address] - password EPAS [facility]  [place] [facility]  Office  [phone]  CC: Primary care physician; [name], MD

## 2018-11-22 NOTE — Progress Notes (Signed)
Received MD order to discharge patient to home, reviewed homes meds, prescriptions, discharge instructions and follow up appointment with patient and wife and both verbalized understanding

## 2018-11-22 NOTE — TOC Initial Note (Signed)
Transition of Care [facility]) - Initial/Assessment Note    Patient Details  Name: Brian White MRN: [id number] Date of Birth: 02/02/1951  Transition of Care [facility]) CM/SW Contact:    Annamaria Boots, [place] Phone Number: 11/22/2018, 11:41 AM  Clinical Narrative: Patient has been referred for home with hospice services. CSW spoke with wife and she is agreeable to home with hospice and would like to use [facility] services at home. CSW notified Santiago Glad with [facility] of referral. CSW will continue to follow for discharge planning.                   Expected Discharge Plan: Home w Hospice Care Barriers to Discharge: Continued Medical Work up   Patient Goals and CMS Choice   CMS Medicare.gov Compare Post Acute Care list provided to:: Patient Represenative (must comment)(Wife- [place] ) Choice offered to / list presented to : Spouse  Expected Discharge Plan and Services Expected Discharge Plan: [place] Acute Care Choice: Hospice Living arrangements for the past 2 months: Single Family Home                                      Prior Living Arrangements/Services Living arrangements for the past 2 months: Single Family Home Lives with:: Spouse Patient language and need for interpreter reviewed:: Yes Do you feel safe going back to the place where you live?: Yes      Need for Family Participation in Patient Care: Yes (Comment) Care giver support system in place?: Yes (comment)   Criminal Activity/Legal Involvement Pertinent to Current Situation/Hospitalization: No - Comment as needed  Activities of Daily Living Home Assistive Devices/Equipment: Walker (specify type) ADL Screening (condition at time of admission) Patient's cognitive ability adequate to safely complete daily activities?: Yes Is the patient deaf or have difficulty hearing?: No Does the patient have difficulty seeing, even when wearing glasses/contacts?: No Does the patient have  difficulty concentrating, remembering, or making decisions?: No Patient able to express need for assistance with ADLs?: Yes Does the patient have difficulty dressing or bathing?: No Independently performs ADLs?: Yes (appropriate for developmental age) Does the patient have difficulty walking or climbing stairs?: Yes Weakness of Legs: None Weakness of Arms/Hands: None  Permission Sought/Granted Permission sought to share information with : Case Manager, Customer service manager, Family Supports Permission granted to share information with : Yes, Verbal Permission Granted              Emotional Assessment Appearance:: Appears stated age Attitude/Demeanor/Rapport: Lethargic Affect (typically observed): Accepting Orientation: : Oriented to Self, Oriented to Place, Oriented to  Time, Oriented to Situation Alcohol / Substance Use: Not Applicable Psych Involvement: No (comment)  Admission diagnosis:  Cough [R05] Respiratory failure, unspecified chronicity, unspecified whether with hypoxia or hypercapnia ([place]) [J96.90] Patient Active Problem List   Diagnosis Date Noted  . Lung cancer ([place]) 11/22/2018  . Acute on chronic combined systolic and diastolic CHF (congestive heart failure) ([place])   . Primary malignant neoplasm of lung metastatic to other site [facility])   . Ventricular tachycardia ([place])   . Atrial fibrillation with RVR ([place]) 07/29/2018  . PAF (paroxysmal atrial fibrillation) ([place]) 07/28/2018  . Atrial flutter with rapid ventricular response ([place])   . GERD (gastroesophageal reflux disease) 06/26/2018  . Anticoagulated 05/13/2018  . Pancytopenia due to chemotherapy ([place]) 05/13/2018  . Cardiomyopathy- etiology not  yet determined 05/13/2018  . Tachycardia 05/01/2018  . Low serum thyroid stimulating hormone (TSH) 05/01/2018  . Anemia 05/01/2018  . Atrial flutter ([place]) 05/01/2018  . Itching due to drug 12/19/2017  . Excessive lacrimation 11/07/2017  . Chronic fatigue  05/08/2017  . Encounter for antineoplastic immunotherapy 05/08/2017  . Goals of care, counseling/discussion 05/08/2017  . Rectal polyp 02/28/2017  . Encounter for antineoplastic chemotherapy 12/20/2015  . Herpes zoster 12/06/2015  . Cancer of lower lobe of left lung ([place]) 10/28/2015  . Lung nodule < 6cm on CT   . Thoracic aorta atherosclerosis ([place]) 10/08/2015  . Advanced care planning/counseling discussion 08/27/2015  . Health care maintenance 01/29/2014  . Polycythemia 01/25/2014  . HLD (hyperlipidemia) 01/25/2014  . Medicare annual wellness visit, subsequent 11/22/2012  . H/O seasonal allergies   . Smoker   . CAD (coronary artery disease)   . ED (erectile dysfunction)   . Prostate cancer ([place]) 08/02/2011  . HTN (hypertension) 06/26/2011  . COPD (chronic obstructive pulmonary disease) ([place]) 06/26/2011   PCP:  [name], MD Pharmacy:   [facility] ORDER) [place], [place] [address] Phone: [phone] Fax: [phone]  [facility] [place], [place] - [place] [address] [facility] [address] AT [address] [facility] [place] [address] Phone: [phone] Fax: [phone]     Social Determinants of Health (SDOH) Interventions    Readmission Risk Interventions No flowsheet data found.

## 2018-11-22 NOTE — Telephone Encounter (Signed)
Christy with [facility] was speaking with Rosaria Ferries [facility] and Rosaria Ferries transferred call to me to give OK for hospice services. Alyse Low said would transfer to Crystal to take order. Crystal notified per Dr Darnell Level that OK to initiate Hospice services and I asked Dr Darnell Level and he will be attending for hospice.Crystal voiced understanding and nothing further needed.

## 2018-11-22 NOTE — Telephone Encounter (Signed)
Ok to do - referral placed.

## 2018-11-25 ENCOUNTER — Encounter: Payer: Self-pay | Admitting: Oncology

## 2018-11-25 ENCOUNTER — Telehealth: Payer: Self-pay | Admitting: Family Medicine

## 2018-11-25 ENCOUNTER — Telehealth: Payer: Self-pay | Admitting: Medical Oncology

## 2018-11-25 ENCOUNTER — Telehealth: Payer: Self-pay

## 2018-11-25 ENCOUNTER — Inpatient Hospital Stay: Payer: BLUE CROSS/BLUE SHIELD | Admitting: Internal Medicine

## 2018-11-26 LAB — CULTURE, BLOOD (ROUTINE X 2)
Culture: NO GROWTH
Culture: NO GROWTH
Special Requests: ADEQUATE

## 2018-11-27 ENCOUNTER — Other Ambulatory Visit: Payer: Medicare Other

## 2018-11-27 ENCOUNTER — Ambulatory Visit (HOSPITAL_COMMUNITY): Admission: RE | Admit: 2018-11-27 | Payer: Medicare Other | Source: Ambulatory Visit

## 2018-12-02 ENCOUNTER — Ambulatory Visit: Payer: Medicare Other | Admitting: Internal Medicine

## 2018-12-16 NOTE — Telephone Encounter (Signed)
[facility] Night - Client Nonclinical Telephone Record [facility] Night - Client Client Site [place] Physician [name] - MD Contact Type Call Call Elmore Page Now Who Is [place] Name [place] Name [place] Number [phone] Patient Name Brian White Patient DOB [birth year] Reason for Call Request to speak to Physician Initial Comment Caller states she is calling from [facility] and needs an order to increase PT's Lorazepam. Additional Comment Paging DoctorName Phone DateTime Result/Outcome Message Type Notes Loura Pardon - [place] [phone] 11/24/2018 4:35:15 PM Paged On Call to Other Provider Doctor Paged Please contact Terri with [facility] at [phone] regarding a page. Loura Pardon - [place] 11/24/2018 4:35:23 PM Paged On Call to Another Provider Message Result Call Closed By: Carleene Mains Transaction Date/Time: 11/24/2018 4:26:29 PM (ET)

## 2018-12-16 NOTE — Telephone Encounter (Signed)
Spoke with wife, expressed my condolences.

## 2018-12-16 NOTE — Telephone Encounter (Signed)
stephane @ hospice called to let you know pt passed this morning @ 9:08am

## 2018-12-16 NOTE — Telephone Encounter (Signed)
Please refer to note from earlier today (Dec 04, 2018) from hospice team.  Patient passed earlier today.   FYI to Dr. Darnell Level.

## 2018-12-16 NOTE — Telephone Encounter (Signed)
Expressed to wife our sympathy in hearing about Davis's death . He died today at 50 with hospice care.

## 2018-12-16 DEATH — deceased

## 2018-12-25 NOTE — Telephone Encounter (Signed)
Erroneous encounter

## 2019-01-17 IMAGING — CR DG CHEST 2V
2 series · 2 of 2 positions shown · non-contrast
Comparison: Radiograph October 18, 2015.

CLINICAL DATA: Palpitations, chest pain.

EXAM:
CHEST - 2 VIEW

[chest pa]
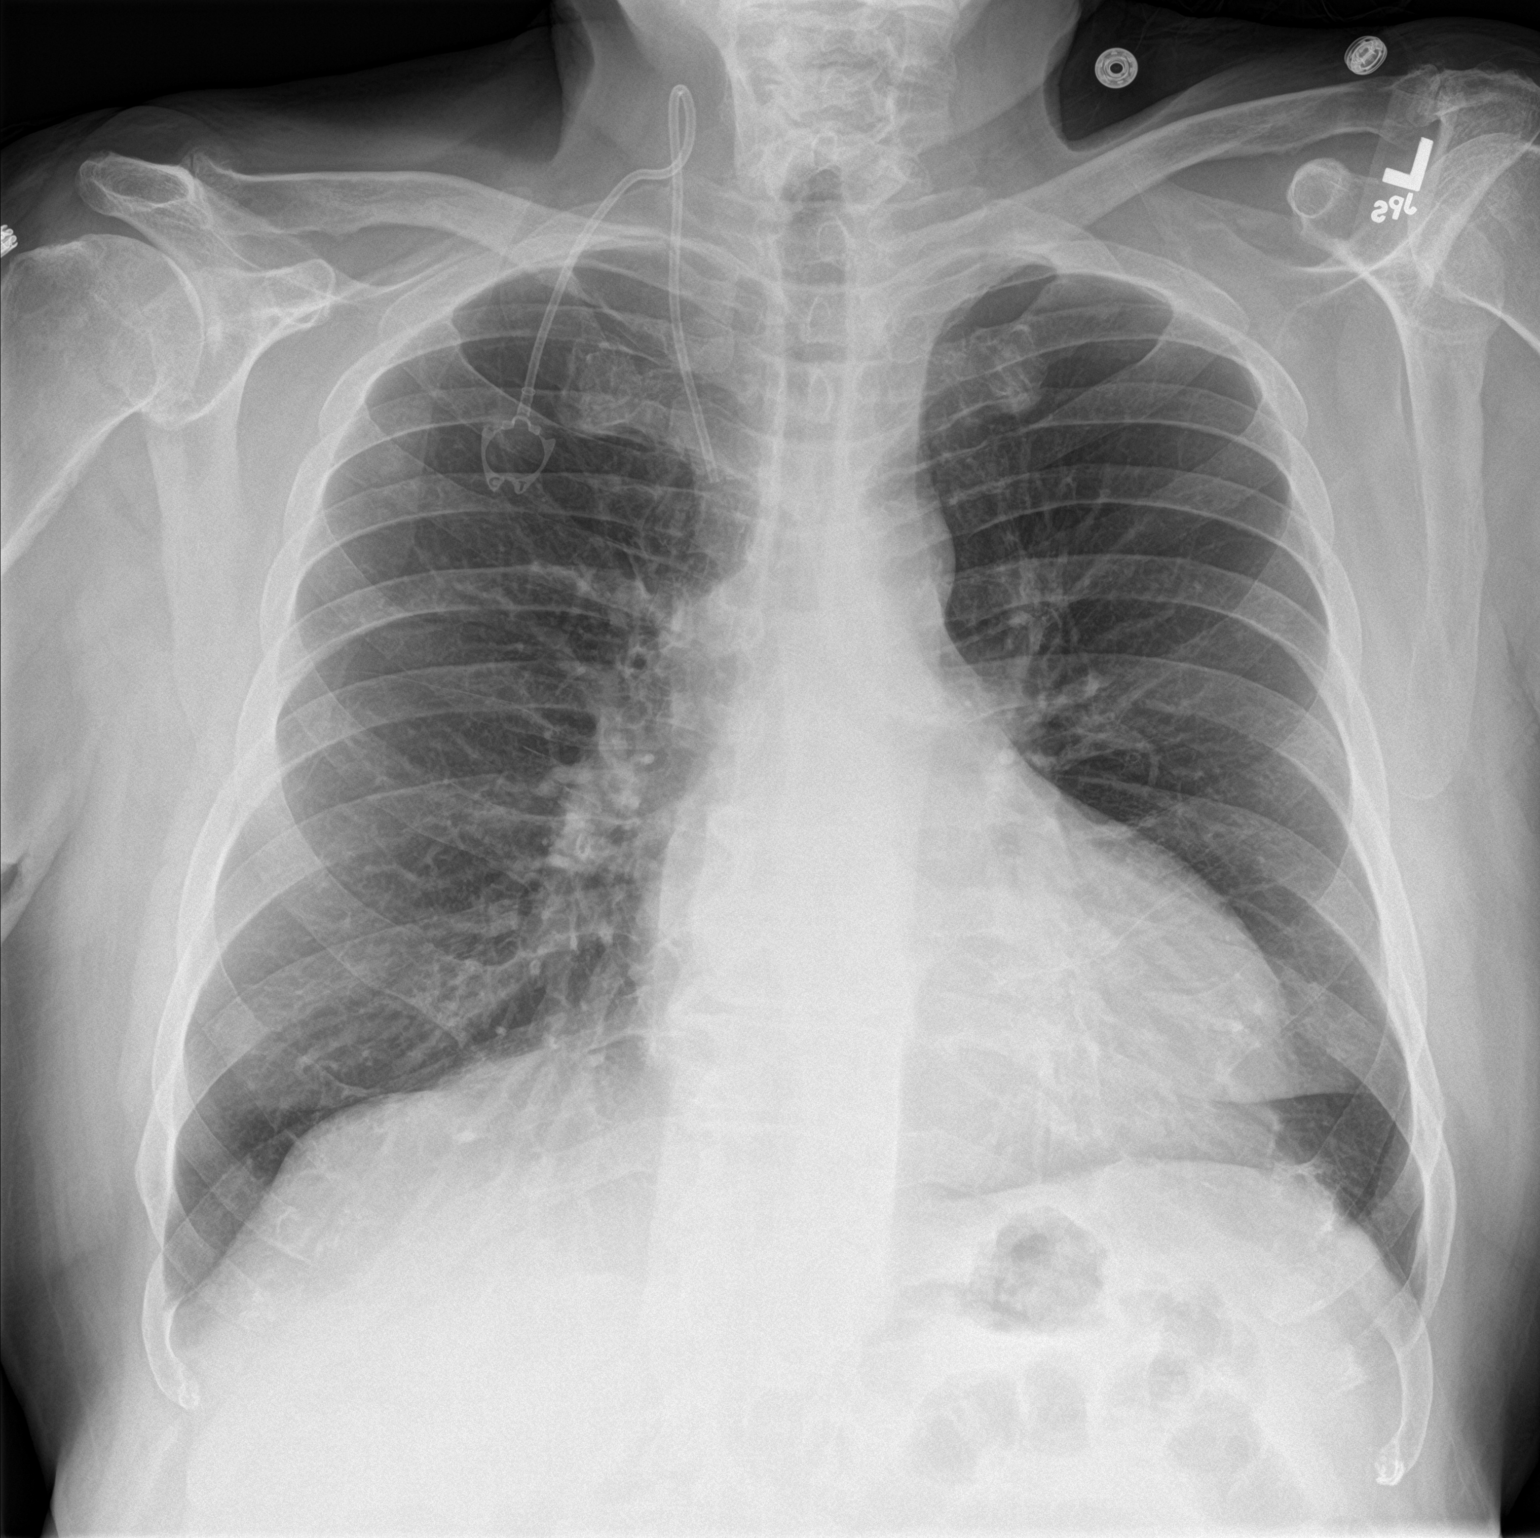

[chest lat]
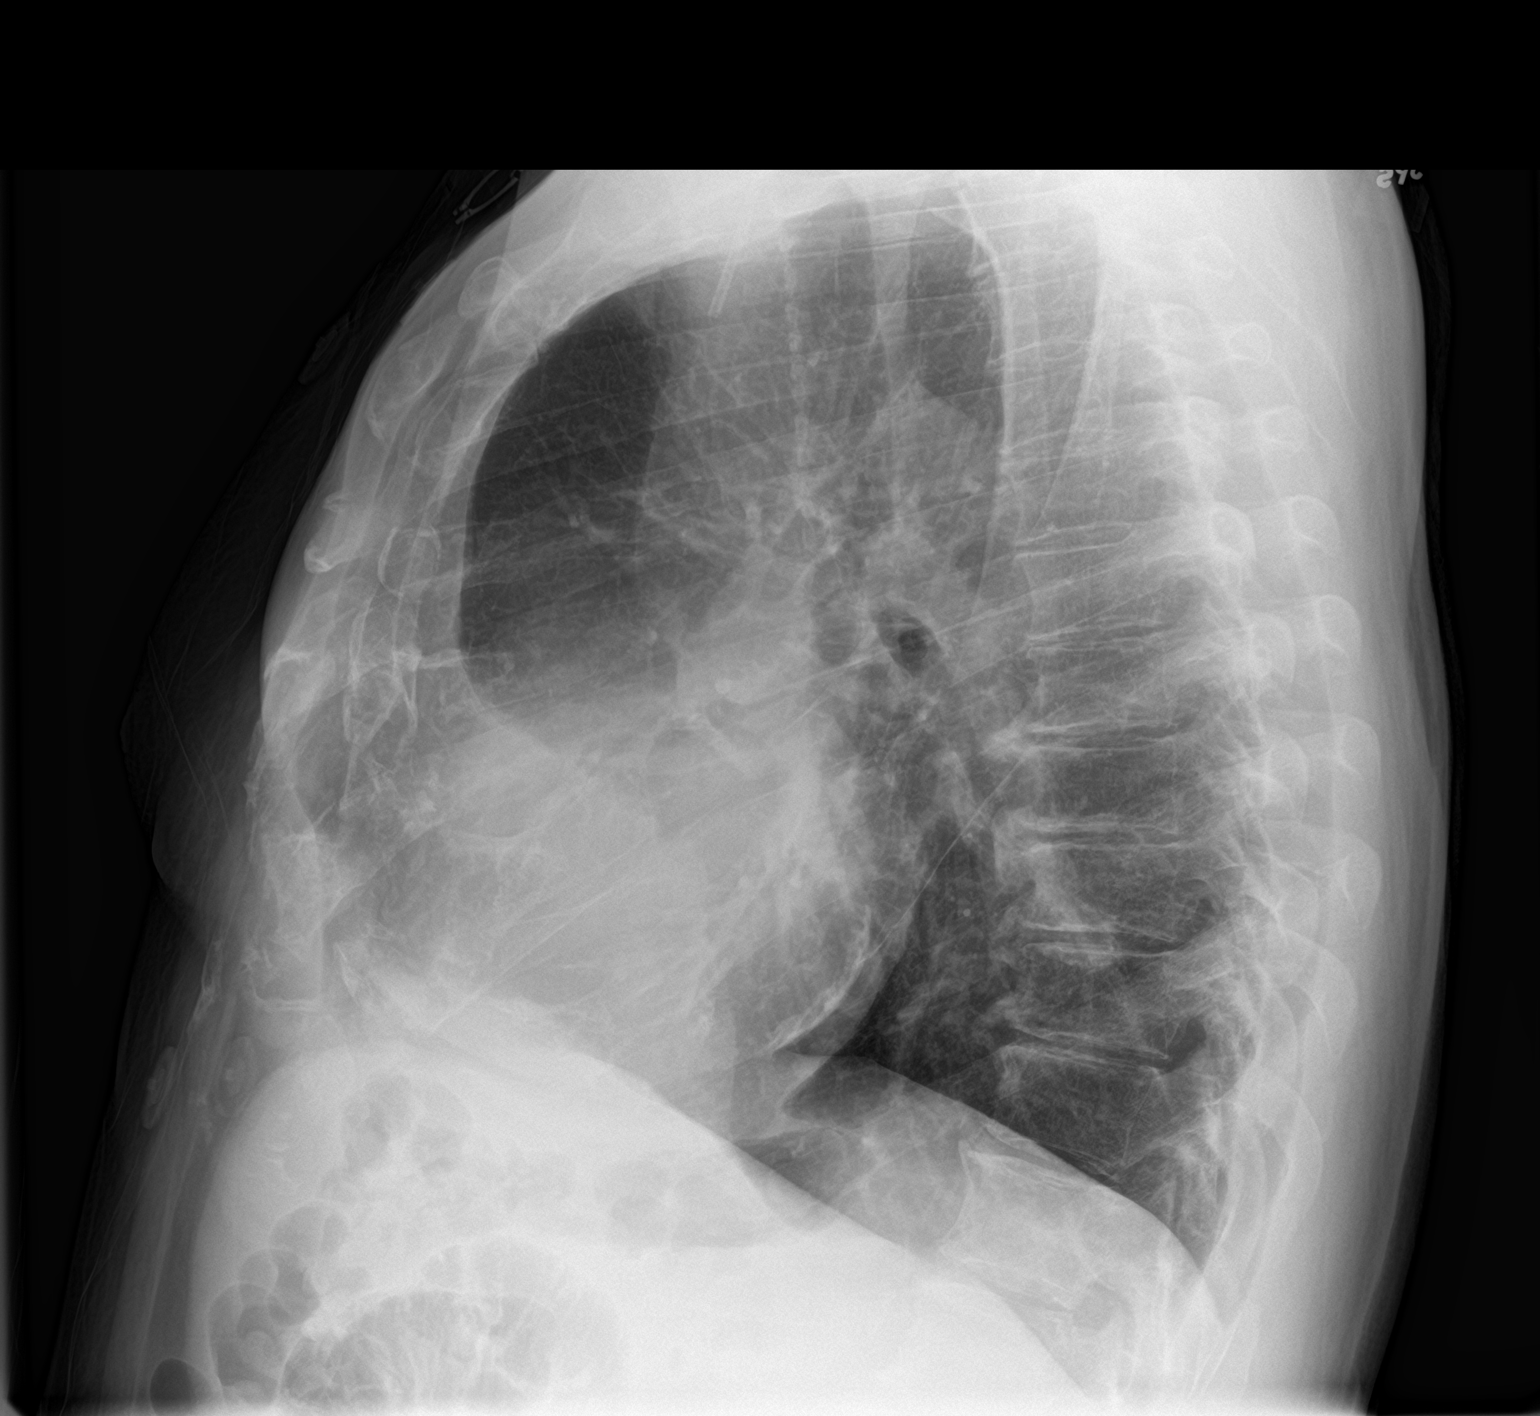

[2 of 2 positions shown; findings below may reference images not displayed]

FINDINGS: Stable cardiomediastinal silhouette. No pneumothorax or pleural
effusion is noted. Interval placement of right internal jugular
Port-A-Cath with distal tip in expected position of superior vena
cava. Both lungs are clear. The visualized skeletal structures are
unremarkable.
IMPRESSION: Interval placement of right internal jugular Port-A-Cath. No acute
cardiopulmonary abnormality seen.

## 2019-02-04 ENCOUNTER — Other Ambulatory Visit: Payer: BLUE CROSS/BLUE SHIELD

## 2019-02-11 ENCOUNTER — Encounter: Payer: Medicare Other | Admitting: Family Medicine

## 2019-08-09 IMAGING — DX PORTABLE CHEST - 1 VIEW
1 series · 1 of 1 positions shown · non-contrast
Comparison: Chest CT 11/20/2018

CLINICAL DATA: Shortness of breath, fever. History of COPD, lung
cancer. Cough.

EXAM:
PORTABLE CHEST 1 VIEW

[chest ap]
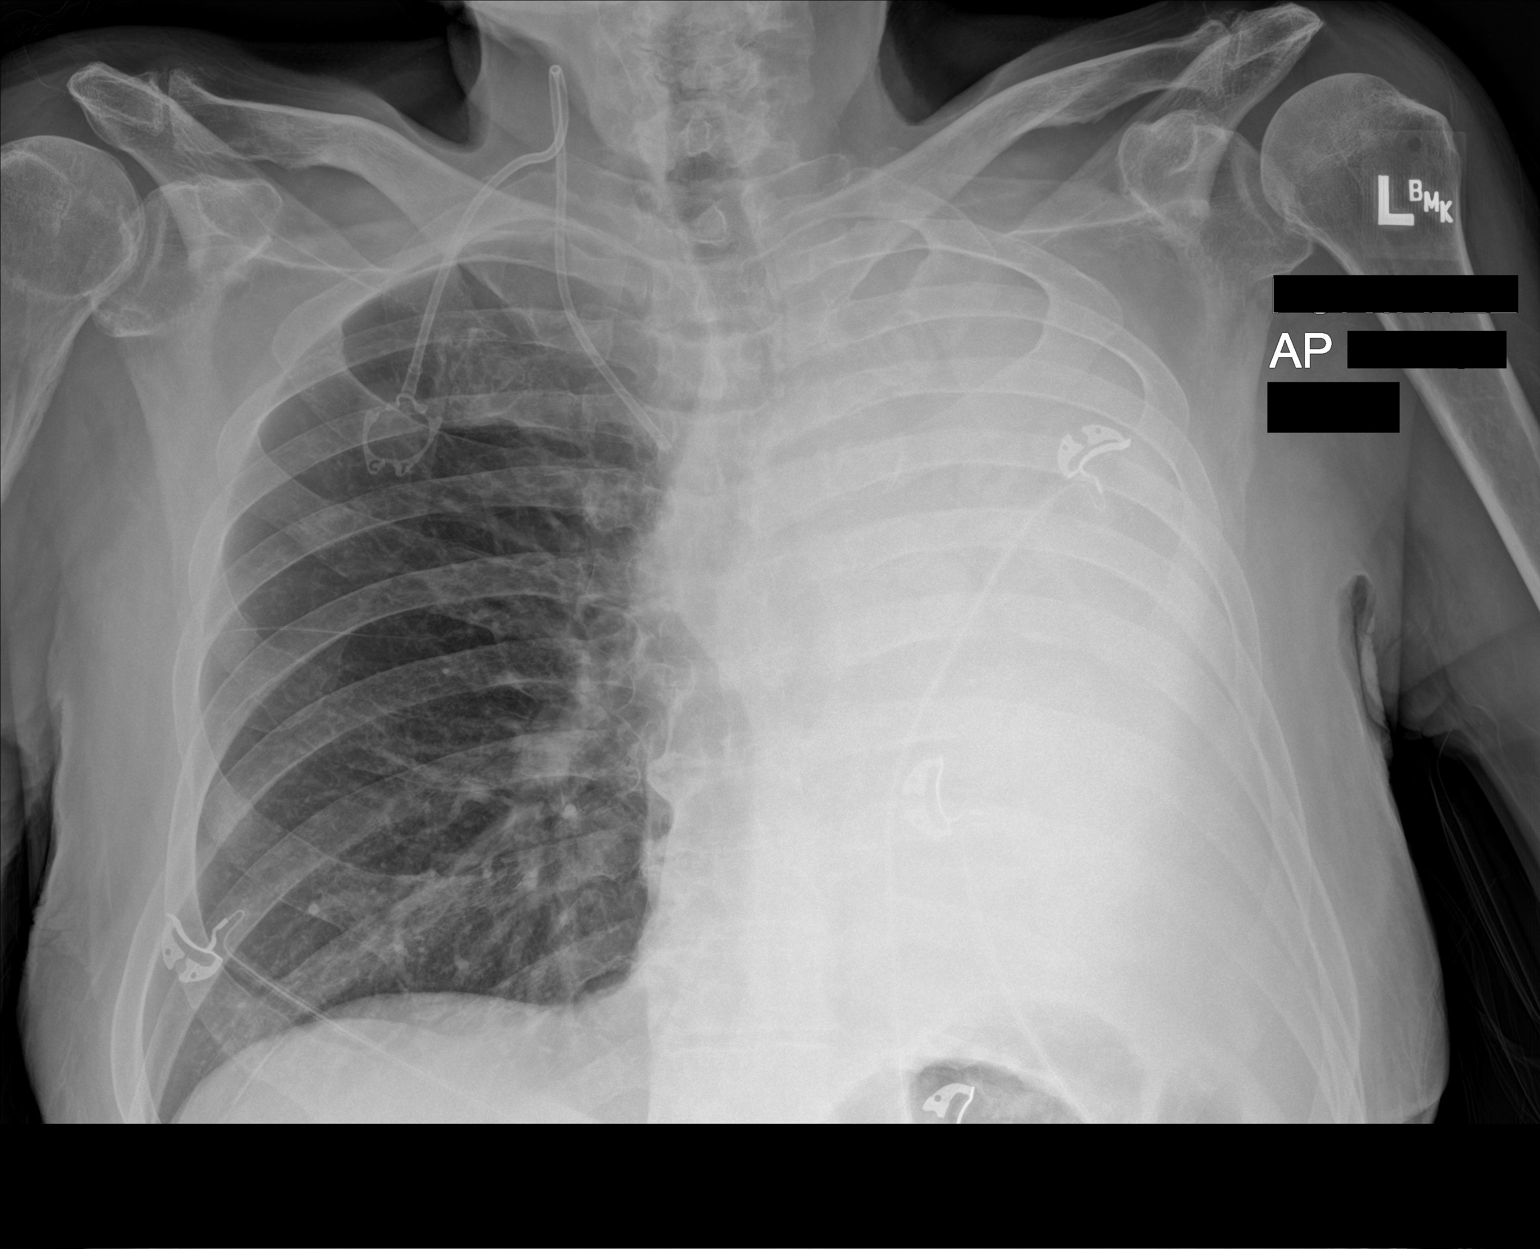

[1 of 1 positions shown; findings below may reference images not displayed]

FINDINGS: Right Port-A-Cath is in place with the tip in the upper SVC.
Complete opacification of the left hemithorax, stable since recent
CT yesterday. Right lung clear. No acute bony abnormality.
IMPRESSION: Complete opacification of the left hemithorax stable since recent
chest CT performed yesterday. This was shown to be related to a
combination of effusion and obstruction of the left mainstem
bronchus by central mass.
